# Patient Record
Sex: Female | Born: 1940
Health system: Southern US, Community
[De-identification: ages and names within clinical notes are randomized; demographics above are authoritative.]

## PROBLEM LIST (undated history)

## (undated) DIAGNOSIS — K219 Gastro-esophageal reflux disease without esophagitis: Secondary | ICD-10-CM

## (undated) DIAGNOSIS — J439 Emphysema, unspecified: Secondary | ICD-10-CM

## (undated) DIAGNOSIS — E213 Hyperparathyroidism, unspecified: Secondary | ICD-10-CM

## (undated) DIAGNOSIS — I10 Essential (primary) hypertension: Secondary | ICD-10-CM

## (undated) DIAGNOSIS — M1990 Unspecified osteoarthritis, unspecified site: Secondary | ICD-10-CM

## (undated) DIAGNOSIS — J309 Allergic rhinitis, unspecified: Secondary | ICD-10-CM

## (undated) DIAGNOSIS — J479 Bronchiectasis, uncomplicated: Secondary | ICD-10-CM

## (undated) DIAGNOSIS — B009 Herpesviral infection, unspecified: Secondary | ICD-10-CM

## (undated) DIAGNOSIS — K5790 Diverticulosis of intestine, part unspecified, without perforation or abscess without bleeding: Secondary | ICD-10-CM

## (undated) DIAGNOSIS — K635 Polyp of colon: Secondary | ICD-10-CM

## (undated) HISTORY — DX: Hyperparathyroidism, unspecified: E21.3

## (undated) HISTORY — DX: Emphysema, unspecified: J43.9

## (undated) HISTORY — PX: COLONOSCOPY: SHX174

## (undated) HISTORY — DX: Herpesviral infection, unspecified: B00.9

## (undated) HISTORY — PX: EYE SURGERY: SHX253

## (undated) HISTORY — DX: Polyp of colon: K63.5

## (undated) HISTORY — DX: Allergic rhinitis, unspecified: J30.9

## (undated) HISTORY — DX: Bronchiectasis, uncomplicated: J47.9

## (undated) HISTORY — DX: Gastro-esophageal reflux disease without esophagitis: K21.9

## (undated) HISTORY — DX: Unspecified osteoarthritis, unspecified site: M19.90

## (undated) HISTORY — DX: Diverticulosis of intestine, part unspecified, without perforation or abscess without bleeding: K57.90

## (undated) HISTORY — PX: ABDOMINAL HYSTERECTOMY: SHX81

---

## 1997-10-31 ENCOUNTER — Ambulatory Visit (HOSPITAL_COMMUNITY): Admission: RE | Admit: 1997-10-31 | Discharge: 1997-10-31 | Payer: Self-pay | Admitting: *Deleted

## 1998-05-10 ENCOUNTER — Other Ambulatory Visit: Admission: RE | Admit: 1998-05-10 | Discharge: 1998-05-10 | Payer: Self-pay | Admitting: Obstetrics and Gynecology

## 1998-05-15 ENCOUNTER — Ambulatory Visit (HOSPITAL_COMMUNITY): Admission: RE | Admit: 1998-05-15 | Discharge: 1998-05-15 | Payer: Self-pay | Admitting: *Deleted

## 1999-07-24 ENCOUNTER — Other Ambulatory Visit: Admission: RE | Admit: 1999-07-24 | Discharge: 1999-07-24 | Payer: Self-pay | Admitting: Obstetrics and Gynecology

## 1999-07-24 ENCOUNTER — Ambulatory Visit (HOSPITAL_COMMUNITY): Admission: RE | Admit: 1999-07-24 | Discharge: 1999-07-24 | Payer: Self-pay | Admitting: *Deleted

## 2000-10-09 ENCOUNTER — Other Ambulatory Visit: Admission: RE | Admit: 2000-10-09 | Discharge: 2000-10-09 | Payer: Self-pay | Admitting: *Deleted

## 2000-10-22 ENCOUNTER — Encounter: Payer: Self-pay | Admitting: *Deleted

## 2000-10-22 ENCOUNTER — Ambulatory Visit (HOSPITAL_COMMUNITY): Admission: RE | Admit: 2000-10-22 | Discharge: 2000-10-22 | Payer: Self-pay | Admitting: *Deleted

## 2002-01-22 ENCOUNTER — Encounter: Payer: Self-pay | Admitting: Internal Medicine

## 2002-01-22 ENCOUNTER — Ambulatory Visit (HOSPITAL_COMMUNITY): Admission: RE | Admit: 2002-01-22 | Discharge: 2002-01-22 | Payer: Self-pay | Admitting: Internal Medicine

## 2002-10-26 ENCOUNTER — Encounter: Payer: Self-pay | Admitting: Internal Medicine

## 2002-10-26 ENCOUNTER — Ambulatory Visit (HOSPITAL_COMMUNITY): Admission: RE | Admit: 2002-10-26 | Discharge: 2002-10-26 | Payer: Self-pay | Admitting: Internal Medicine

## 2002-11-26 ENCOUNTER — Encounter: Admission: RE | Admit: 2002-11-26 | Discharge: 2002-11-26 | Payer: Self-pay | Admitting: Internal Medicine

## 2002-11-26 ENCOUNTER — Encounter: Payer: Self-pay | Admitting: Internal Medicine

## 2003-01-31 ENCOUNTER — Ambulatory Visit (HOSPITAL_COMMUNITY): Admission: RE | Admit: 2003-01-31 | Discharge: 2003-01-31 | Payer: Self-pay | Admitting: Internal Medicine

## 2003-01-31 ENCOUNTER — Encounter: Payer: Self-pay | Admitting: Internal Medicine

## 2003-11-17 ENCOUNTER — Other Ambulatory Visit: Admission: RE | Admit: 2003-11-17 | Discharge: 2003-11-17 | Payer: Self-pay | Admitting: Obstetrics and Gynecology

## 2004-01-18 ENCOUNTER — Ambulatory Visit (HOSPITAL_COMMUNITY): Admission: RE | Admit: 2004-01-18 | Discharge: 2004-01-18 | Payer: Self-pay | Admitting: Internal Medicine

## 2005-01-31 ENCOUNTER — Ambulatory Visit (HOSPITAL_COMMUNITY): Admission: RE | Admit: 2005-01-31 | Discharge: 2005-01-31 | Payer: Self-pay | Admitting: Internal Medicine

## 2005-08-06 ENCOUNTER — Ambulatory Visit (HOSPITAL_COMMUNITY): Admission: RE | Admit: 2005-08-06 | Discharge: 2005-08-06 | Payer: Self-pay | Admitting: Internal Medicine

## 2006-02-05 ENCOUNTER — Ambulatory Visit (HOSPITAL_COMMUNITY): Admission: RE | Admit: 2006-02-05 | Discharge: 2006-02-05 | Payer: Self-pay | Admitting: Internal Medicine

## 2006-07-29 ENCOUNTER — Ambulatory Visit (HOSPITAL_COMMUNITY): Admission: RE | Admit: 2006-07-29 | Discharge: 2006-07-29 | Payer: Self-pay | Admitting: Internal Medicine

## 2007-02-19 ENCOUNTER — Ambulatory Visit (HOSPITAL_COMMUNITY): Admission: RE | Admit: 2007-02-19 | Discharge: 2007-02-19 | Payer: Self-pay | Admitting: Internal Medicine

## 2007-11-19 ENCOUNTER — Encounter: Admission: RE | Admit: 2007-11-19 | Discharge: 2007-11-19 | Payer: Self-pay | Admitting: Rehabilitation

## 2008-03-16 ENCOUNTER — Ambulatory Visit (HOSPITAL_COMMUNITY): Admission: RE | Admit: 2008-03-16 | Discharge: 2008-03-16 | Payer: Self-pay | Admitting: Internal Medicine

## 2009-04-10 ENCOUNTER — Ambulatory Visit (HOSPITAL_COMMUNITY): Admission: RE | Admit: 2009-04-10 | Discharge: 2009-04-10 | Payer: Self-pay | Admitting: Internal Medicine

## 2010-04-19 ENCOUNTER — Ambulatory Visit (HOSPITAL_COMMUNITY): Admission: RE | Admit: 2010-04-19 | Discharge: 2010-04-19 | Payer: Self-pay | Admitting: Internal Medicine

## 2010-09-25 ENCOUNTER — Encounter: Payer: Self-pay | Admitting: Gastroenterology

## 2010-09-27 ENCOUNTER — Encounter: Payer: Self-pay | Admitting: Gastroenterology

## 2010-10-09 ENCOUNTER — Encounter (INDEPENDENT_AMBULATORY_CARE_PROVIDER_SITE_OTHER): Payer: Self-pay | Admitting: *Deleted

## 2010-10-12 ENCOUNTER — Encounter: Payer: Self-pay | Admitting: Gastroenterology

## 2010-10-12 ENCOUNTER — Ambulatory Visit
Admission: RE | Admit: 2010-10-12 | Discharge: 2010-10-12 | Payer: Self-pay | Source: Home / Self Care | Attending: Gastroenterology | Admitting: Gastroenterology

## 2010-10-12 ENCOUNTER — Encounter (INDEPENDENT_AMBULATORY_CARE_PROVIDER_SITE_OTHER): Payer: Self-pay | Admitting: *Deleted

## 2010-10-12 ENCOUNTER — Other Ambulatory Visit: Payer: Self-pay | Admitting: Gastroenterology

## 2010-10-12 LAB — FERRITIN: Ferritin: 184.7 ng/mL (ref 10.0–291.0)

## 2010-10-12 LAB — VITAMIN B12: Vitamin B-12: 818 pg/mL (ref 211–911)

## 2010-10-12 LAB — IGA: IgA: 660 mg/dL — ABNORMAL HIGH (ref 68–378)

## 2010-10-12 LAB — CONVERTED CEMR LAB: Tissue Transglutaminase Ab, IgA: 4.2 units (ref <20)

## 2010-10-12 LAB — CALCIUM: Calcium: 10.4 mg/dL (ref 8.4–10.5)

## 2010-10-12 LAB — IBC PANEL
Iron: 73 ug/dL (ref 42–145)
Saturation Ratios: 20.4 % (ref 20.0–50.0)
Transferrin: 255.8 mg/dL (ref 212.0–360.0)

## 2010-10-16 ENCOUNTER — Other Ambulatory Visit: Payer: Self-pay | Admitting: Internal Medicine

## 2010-10-16 DIAGNOSIS — Z803 Family history of malignant neoplasm of breast: Secondary | ICD-10-CM

## 2010-10-16 DIAGNOSIS — N6310 Unspecified lump in the right breast, unspecified quadrant: Secondary | ICD-10-CM

## 2010-10-18 NOTE — Letter (Signed)
Summary: [facility] Instructions  [facility]  [address] [place], [place] [address]   Phone: [phone]  Fax: [phone]       GLORINE HANRATTY    09-Feb-1941    MRN: [id number]        Procedure Day /Date: Monday 2/6/202     Arrival Time: 10:30am     Procedure Time: 11:30am     Location of Procedure:                    X  [facility] (4th Floor)                      PREPARATION FOR COLONOSCOPY WITH MOVIPREP   Starting 5 days prior to your procedure 10/17/2010 do not eat nuts, seeds, popcorn, corn, beans, peas,  salads, or any raw vegetables.  Do not take any fiber supplements (e.g. Metamucil, Citrucel, and Benefiber).  THE DAY BEFORE YOUR PROCEDURE         Sunday 10/21/2010  1.  Drink clear liquids the entire day-NO SOLID FOOD  2.  Do not drink anything colored red or purple.  Avoid juices with pulp.  No orange juice.  3.  Drink at least 64 oz. (8 glasses) of fluid/clear liquids during the day to prevent dehydration and help the prep work efficiently.  CLEAR LIQUIDS INCLUDE: Water Jello Ice Popsicles Tea (sugar ok, no milk/cream) Powdered fruit flavored drinks Coffee (sugar ok, no milk/cream) Gatorade Juice: apple, white grape, white cranberry  Lemonade Clear bullion, consomm, broth Carbonated beverages (any kind) Strained chicken noodle soup Hard Candy                             4.  In the morning, mix first dose of MoviPrep solution:    Empty 1 Pouch A and 1 Pouch B into the disposable container    Add lukewarm drinking water to the top line of the container. Mix to dissolve    Refrigerate (mixed solution should be used within 24 hrs)  5.  Begin drinking the prep at 5:00 p.m. The MoviPrep container is divided by 4 marks.   Every 15 minutes drink the solution down to the next mark (approximately 8 oz) until the full liter is complete.   6.  Follow completed prep with 16 oz of clear liquid of your choice (Nothing red or purple).   Continue to drink clear liquids until bedtime.  7.  Before going to bed, mix second dose of MoviPrep solution:    Empty 1 Pouch A and 1 Pouch B into the disposable container    Add lukewarm drinking water to the top line of the container. Mix to dissolve    Refrigerate  THE DAY OF YOUR PROCEDURE      Monday 10/22/2010  Beginning at 6:30am (5 hours before procedure):         1. Every 15 minutes, drink the solution down to the next mark (approx 8 oz) until the full liter is complete.  2. Follow completed prep with 16 oz. of clear liquid of your choice.    3. You may drink clear liquids until 9:30am (2 HOURS BEFORE PROCEDURE).   MEDICATION INSTRUCTIONS  Unless otherwise instructed, you should take regular prescription medications with a small sip of water   as early as possible the morning of your procedure.  OTHER INSTRUCTIONS  You will need a responsible adult at least 70 years of age to accompany you and drive you home.   This person must remain in the waiting room during your procedure.  Wear loose fitting clothing that is easily removed.  Leave jewelry and other valuables at home.  However, you may wish to bring a book to read or  an iPod/MP3 player to listen to music as you wait for your procedure to start.  Remove all body piercing jewelry and leave at home.  Total time from sign-in until discharge is approximately 2-3 hours.  You should go home directly after your procedure and rest.  You can resume normal activities the  day after your procedure.  The day of your procedure you should not:   Drive   Make legal decisions   Operate machinery   Drink alcohol   Return to work  You will receive specific instructions about eating, activities and medications before you leave.    The above instructions have been reviewed and explained to me by   _______________________    I fully understand and can verbalize these instructions  _____________________________ Date _________

## 2010-10-18 NOTE — Letter (Signed)
Summary: New Patient letter  [facility]  [address] [place], [place] [address]   Phone: [phone]  Fax: [phone]       10/09/2010 MRN: [id number]  Catherine Parks [address] [place], [place]  [address]  Dear [name],  Welcome to the Gastroenterology Division at [facility].    You are scheduled to see Dr.  Sheryn Bison on October 12, 2010 at 8:30am on the 3rd floor at [facility], [address]  We ask that you try to arrive at our office 15 minutes prior to your appointment time to allow for check-in.  We would like you to complete the enclosed self-administered evaluation form prior to your visit and bring it with you on the day of your appointment.  We will review it with you.  Also, please bring a complete list of all your medications or, if you prefer, bring the medication bottles and we will list them.  Please bring your insurance card so that we may make a copy of it.  If your insurance requires a referral to see a specialist, please bring your referral form from your primary care physician.  Co-payments are due at the time of your visit and may be paid by cash, check or credit card.     Your office visit will consist of a consult with your physician (includes a physical exam), any laboratory testing he/she may order, scheduling of any necessary diagnostic testing (e.g. x-ray, ultrasound, CT-scan), and scheduling of a procedure (e.g. Endoscopy, Colonoscopy) if required.  Please allow enough time on your schedule to allow for any/all of these possibilities.    If you cannot keep your appointment, please call [phone] to cancel or reschedule prior to your appointment date.  This allows [place] the opportunity to schedule an appointment for another patient in need of care.  If you do not cancel or reschedule by 5 p.m. the business day prior to your appointment date, you will be charged a $50.00 late cancellation/no-show fee.    Thank  you for choosing [facility] for your medical needs.  We appreciate the opportunity to care for you.  Please visit [place] at our website  to learn more about our practice.                     Sincerely,                                                             The Gastroenterology Division

## 2010-10-18 NOTE — Assessment & Plan Note (Signed)
Summary: abd pain.Marland Kitchenem   History of Present Illness Visit Type: Initial Consult Primary GI MD: Sheryn Bison MD FACP FAGA Primary Provider: Marisue Brooklyn, DO Requesting Provider: Marisue Brooklyn, DO Chief Complaint: Lower abd pain and lower back pain that started back in October. Pt has a BM every day but the stools are hard and small. Pt does strain to use the bathroom.  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 She apparently had a colonoscopy 10 years ago that was unremarkable, but these records are not available for review. She has been on the care of Dr. Marisue Brooklyn and his has had normal labs except for a mildly elevated serum calcium level felt secondary to po calcium supplementation.  Her pain is in the lower quadrants and it seems to be progressive as the day goes on,described as a sharp pain, made worse if she does not eat, is associated mild anorexia, mild weight loss, but does not seem related to bowel movements. She relates hard stools,and has to occasionally self disimpact herself. There is no history of rectal bleeding, melena, upper GI or hepatobiliary complaints. She was using Aleve but has discontinued NSAIDs. Past history is remarkable for previous total abdominal hysterectomy apparently she has atrophic ovaries on exam.  She denies systemic complaints such as fever, chills, skin rashes, joint pains, oral stomatitis, or visual difficulties. Her essential hypertension is well controlled on clonidine, amlodipine, and triamterene-HCTZ. Family history is remarkable for ovarian cancer in her sister but no known colon cancer.The Patient Does Smoke and uses ethanol socially.     GI Review of Systems    Reports abdominal pain and  loss of appetite.     Location of  Abdominal pain: lower abdomen.    Denies acid reflux, belching, bloating, chest pain,  dysphagia with liquids, dysphagia with solids, heartburn, nausea, vomiting, vomiting blood, weight loss, and  weight gain.      Reports constipation.     Denies anal fissure, black tarry stools, change in bowel habit, diarrhea, diverticulosis, fecal incontinence, heme positive stool, hemorrhoids, irritable bowel syndrome, jaundice, light color stool, liver problems, rectal bleeding, and  rectal pain. Preventive Screening-Counseling & Management  Alcohol-Tobacco     Smoking Status: current      Drug Use:  no.      Current Medications (verified): 1)  Clonidine Hcl 0.2 Mg Tabs (Clonidine Hcl) .... One Tablet By Mouth Two Times A Day 2)  Amlodipine Besylate 10 Mg Tabs (Amlodipine Besylate) .... One Tablet By Mouth Once Daily 3)  Triamterene-Hctz 37.5-25 Mg Tabs (Triamterene-Hctz) .... One Tablet By Mouth Once Daily  Allergies (verified): No Known Drug Allergies  Past History:  Past medical, surgical, family and social histories (including risk factors) reviewed for relevance to current acute and chronic problems.  Past Medical History: Arthritis Hyperlipidemia Hypertension  Past Surgical History: Hysterectomy  Family History: Reviewed history and no changes required. Family History of Breast Cancer:Maternal Grandmother, Sister Family History of Ovarian Cancer:Sister Family History of Diabetes: Mother, Sister, Maternal Grandmother Family History of Heart Disease: Father, Brother, Mother Family History of Prostate Cancer:Brother  Social History: Reviewed history and no changes required. 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  Alcohol Use - yes occasionally on weekends Daily Caffeine Use every other day Illicit Drug Use - no Smoking Status:  current Drug Use:  no  Review of Systems       The  patient complains of back pain, itching, skin rash, sleeping problems, and urination - excessive.  The patient denies allergy/sinus, anemia, anxiety-new, arthritis/joint  pain, blood in urine, breast changes/lumps, change in vision, confusion, cough, coughing up blood, depression-new, fainting, fatigue, fever, headaches-new, hearing problems, heart murmur, heart rhythm changes, menstrual pain, muscle pains/cramps, night sweats, nosebleeds, pregnancy symptoms, shortness of breath, sore throat, swelling of feet/legs, swollen lymph glands, thirst - excessive , urination - excessive , urination changes/pain, urine leakage, vision changes, and voice change.    Vital Signs:  Patient profile:   70 year old female Height:      67 inches Weight:      153.13 pounds BMI:     24.07 Pulse rate:   84 / minute Pulse rhythm:   regular BP sitting:   118 / 72  (right arm) Cuff size:   regular  Vitals Entered By: Christie Nottingham CMA Duncan Dull) (October 12, 2010 8:33 AM)  Physical Exam  General:  Well developed, well nourished, no acute distress.healthy appearing.  healthy appearing.   Head:  Normocephalic and atraumatic. Eyes:  PERRLA, no icterus.exam deferred to patient's ophthalmologist.  exam deferred to patient's ophthalmologist.   Neck:  Supple; no masses or thyromegaly. Lungs:  Clear throughout to auscultation. Heart:  Regular rate and rhythm; no murmurs, rubs,  or bruits. Abdomen:  Prominent suprapubic scar noted. There is no hepatosplenomegaly, abdominal masses, but there is mild tenderness of the left lower quadrant without definite mass. Bowel sounds are normal. Rectal:  Normal exam.There is a hard impacted stool in the rectal vault which is guaiac-negative. I cannot appreciate a rectocele, fissure, or fistulae. Msk:  Symmetrical with no gross deformities. Normal posture. Extremities:  No clubbing, cyanosis, edema or deformities noted. Neurologic:  Alert and  oriented x4;  grossly normal neurologically. Cervical Nodes:  No significant cervical adenopathy. Psych:  Alert and cooperative. .depressed affect.     Impression & Recommendations:  Problem # 1:   CONSTIPATION (ICD-564.00) Assessment Deteriorated Probable worsening chronic functional constipation with associated pelvic-colonic adhesions causing her pain. I have placed her on Amitiza 8 micrograms twice a day, Metamucil in the morning, and 8 ounces of MiraLax at bedtime with p.r.n. tramadol 50 mg every 8 hours as needed for pain. Colonoscopy has been scheduled at her convenience. I will repeat her serum calcium level, sedimentation rate, and CRP an anemia profile. Orders: TLB-B12, Serum-Total ONLY (03474-Q59) TLB-Ferritin (82728-FER) TLB-Folic Acid (Folate) (82746-FOL) TLB-IBC Pnl (Iron/FE;Transferrin) (83550-IBC) TLB-Calcium (82310-CA) TLB-CRP-High Sensitivity (C-Reactive Protein) (86140-FCRP) TLB-Sedimentation Rate (ESR) (85652-ESR) TLB-IgA (Immunoglobulin A) (82784-IGA) T-Sprue Panel (Celiac Disease Aby Eval) (83516x3/[address])  Problem # 2:  HYPERTENSION (ICD-401.9) Assessment: Improved blood pressure today 118/72, and she is been asked to continue all her other medications per Dr. Carmela Hurt.  Other Orders: Colonoscopy (Colon)  Patient Instructions: 1)  Copy sent to : Marisue Brooklyn, DO 2)  Please go to the basement today for your labs.  3)  Your prescription(s) have been sent to you pharmacy.  4)  Your procedure has been scheduled for 10/22/2010, please follow the seperate instructions.  5)  [facility] Patient Information Guide given to patient.  6)  Upper Endoscopy brochure given.  7)  The medication list was reviewed and reconciled.  All changed / newly prescribed medications were explained.  A complete medication list was provided to the patient / caregiver. Prescriptions: MOVIPREP 100 GM  SOLR (PEG-KCL-NACL-NASULF-NA ASC-C) As per prep instructions.  #1 x 0   Entered by:   Harlow Mares  CMA (AAMA)   Authorized by:   Mardella Layman MD [facility]   Signed by:   Mardella Layman MD [facility] on 10/12/2010   Method used:   Electronically to        [facility]  [address] [phone]* (retail)       [address]       [place], [place]  [id number]       Ph: [phone] or [phone]       Fax: [phone]   RxID:   [id number] AMITIZA 8 MCG CAPS (LUBIPROSTONE) take one by mouth two times a day with food  #60 x 3   Entered by:   Harlow Mares CMA (AAMA)   Authorized by:   Mardella Layman MD [facility]   Signed by:   Mardella Layman MD [facility] on 10/12/2010   Method used:   [facility]        [facility]  [address] [phone]* (retail)       [address]       [place], [place]  [id number]       Ph: [phone] or [phone]       Fax: [phone]   RxID:   [id number] TRAMADOL HCL 50 MG TABS (TRAMADOL HCL) take one by mouth two times a day  #60 x 1   Entered by:   Harlow Mares CMA (AAMA)   Authorized by:   Mardella Layman MD [facility]   Signed by:   Mardella Layman MD [facility] on 10/12/2010   Method used:   [facility]  [address] [phone]* (retail)       [address]       [place], [place]  [id number]       Ph: [phone] or [phone]       Fax: [phone]   RxID:   [id number]

## 2010-10-22 ENCOUNTER — Other Ambulatory Visit (AMBULATORY_SURGERY_CENTER): Payer: Medicare Other | Admitting: Gastroenterology

## 2010-10-22 ENCOUNTER — Encounter: Payer: Self-pay | Admitting: Gastroenterology

## 2010-10-22 DIAGNOSIS — K5730 Diverticulosis of large intestine without perforation or abscess without bleeding: Secondary | ICD-10-CM

## 2010-10-22 DIAGNOSIS — R109 Unspecified abdominal pain: Secondary | ICD-10-CM

## 2010-10-24 NOTE — Letter (Signed)
Summary: [facility]  [facility]   Imported By: Sherian Rein 10/19/2010 15:04:07  _____________________________________________________________________  External Attachment:    Type:   Image     Comment:   External Document

## 2010-10-29 ENCOUNTER — Other Ambulatory Visit: Payer: Self-pay | Admitting: Internal Medicine

## 2010-10-29 DIAGNOSIS — R630 Anorexia: Secondary | ICD-10-CM

## 2010-10-29 DIAGNOSIS — R634 Abnormal weight loss: Secondary | ICD-10-CM

## 2010-10-31 ENCOUNTER — Other Ambulatory Visit: Payer: Self-pay | Admitting: Internal Medicine

## 2010-11-01 NOTE — Procedures (Signed)
Summary: Colonoscopy   Colonoscopy  Procedure date:  10/22/2010  Findings:      Location:  [facility].    Procedures Next Due Date:    Colonoscopy: 10/2020 COLONOSCOPY PROCEDURE REPORT  PATIENT:  Catherine Parks, Catherine Parks  MR#:  [id number] BIRTHDATE:   Feb 03, 1941, 69 yrs. old   GENDER:   female ENDOSCOPIST:   Vania Rea. Jarold Motto, MD, [facility] REF. BY: Marisue Brooklyn, D.O. PROCEDURE DATE:  10/22/2010 PROCEDURE:  Average-risk screening colonoscopy G0121 ASA CLASS:   Class II INDICATIONS: Abdominal pain  MEDICATIONS:    Fentanyl 75 mcg IV, Versed 8 mg IV  DESCRIPTION OF PROCEDURE:   After the risks benefits and alternatives of the procedure were thoroughly explained, informed consent was obtained.  Digital rectal exam was performed and revealed no abnormalities.   The [id number] endoscope was introduced through the anus and advanced to the cecum, which was identified by both the appendix and ileocecal valve, limited by a redundant colon, stenosis, extreme patient discomfort.    The quality of the prep was excellent, using MoviPrep.  The instrument was then slowly withdrawn as the colon was fully examined. <<PROCEDUREIMAGES>>      <<OLD IMAGES>>  FINDINGS:  Severe diverticulosis was found in the sigmoid to descending colon segments. "TIGHT " SIGMOID AREA.LAGE AND NUMEROUS TICS NOTED.SOME STENOSIS.  No polyps or cancers were seen.  This was otherwise a normal examination of the colon.   Retroflexed views in the rectum revealed no abnormalities.    The scope was then withdrawn from the patient and the procedure completed.  COMPLICATIONS:   None ENDOSCOPIC IMPRESSION:  1) Severe diverticulosis in the sigmoid to descending colon segments  2) No polyps or cancers  3) Otherwise normal examination RECOMMENDATIONS:  1) high fiber diet  2) metamucil or benefiber  3) Repeat Colonscopy in 10 years. REPEAT EXAM:   No   _______________________________ Vania Rea. Jarold Motto, MD,  [facility]  CC:

## 2010-11-02 ENCOUNTER — Ambulatory Visit
Admission: RE | Admit: 2010-11-02 | Discharge: 2010-11-02 | Disposition: A | Discharge status: Home / Self Care | Payer: Medicare Other | Source: Ambulatory Visit | Attending: Internal Medicine | Admitting: Internal Medicine

## 2010-11-02 ENCOUNTER — Other Ambulatory Visit: Payer: Medicare Other

## 2010-11-07 ENCOUNTER — Other Ambulatory Visit: Payer: Medicare Other

## 2010-11-13 ENCOUNTER — Ambulatory Visit: Payer: Medicare Other | Admitting: Hematology & Oncology

## 2010-12-03 ENCOUNTER — Other Ambulatory Visit: Payer: Self-pay | Admitting: Hematology & Oncology

## 2010-12-03 ENCOUNTER — Ambulatory Visit (HOSPITAL_BASED_OUTPATIENT_CLINIC_OR_DEPARTMENT_OTHER): Payer: Medicare Other | Admitting: Hematology & Oncology

## 2010-12-03 DIAGNOSIS — F17200 Nicotine dependence, unspecified, uncomplicated: Secondary | ICD-10-CM

## 2010-12-03 DIAGNOSIS — Z803 Family history of malignant neoplasm of breast: Secondary | ICD-10-CM

## 2010-12-03 DIAGNOSIS — I10 Essential (primary) hypertension: Secondary | ICD-10-CM

## 2010-12-03 LAB — CMP (CANCER CENTER ONLY)
ALT(SGPT): 15 U/L (ref 10–47)
AST: 24 U/L (ref 11–38)
Albumin: 3.2 g/dL — ABNORMAL LOW (ref 3.3–5.5)
CO2: 27 mEq/L (ref 18–33)
Calcium: 10 mg/dL (ref 8.0–10.3)
Chloride: 102 mEq/L (ref 98–108)
Potassium: 3.5 mEq/L (ref 3.3–4.7)

## 2010-12-03 LAB — CBC WITH DIFFERENTIAL (CANCER CENTER ONLY)
BASO#: 0 10*3/uL (ref 0.0–0.2)
BASO%: 0.3 % (ref 0.0–2.0)
HCT: 34.7 % — ABNORMAL LOW (ref 34.8–46.6)
LYMPH%: 28.6 % (ref 14.0–48.0)
MCHC: 34.6 g/dL (ref 32.0–36.0)
MCV: 86 fL (ref 81–101)
MONO#: 0.8 10*3/uL (ref 0.1–0.9)
NEUT%: 56.3 % (ref 39.6–80.0)
RDW: 15.8 % — ABNORMAL HIGH (ref 11.1–15.7)

## 2010-12-05 LAB — PROTEIN ELECTROPHORESIS, SERUM: Gamma Globulin: 14.4 % (ref 11.1–18.8)

## 2010-12-05 LAB — ANGIOTENSIN CONVERTING ENZYME: Angiotensin 1 CE: 60 U/L — ABNORMAL HIGH (ref 8–52)

## 2010-12-05 LAB — LACTATE DEHYDROGENASE: LDH: 146 U/L (ref 94–250)

## 2011-05-03 ENCOUNTER — Other Ambulatory Visit: Payer: Self-pay | Admitting: Hematology & Oncology

## 2011-05-03 DIAGNOSIS — R05 Cough: Secondary | ICD-10-CM

## 2011-05-08 ENCOUNTER — Other Ambulatory Visit: Payer: Self-pay | Admitting: Hematology & Oncology

## 2011-05-08 ENCOUNTER — Ambulatory Visit (HOSPITAL_BASED_OUTPATIENT_CLINIC_OR_DEPARTMENT_OTHER)
Admission: RE | Admit: 2011-05-08 | Discharge: 2011-05-08 | Disposition: A | Discharge status: Home / Self Care | Payer: Medicare Other | Source: Ambulatory Visit | Attending: Hematology & Oncology | Admitting: Hematology & Oncology

## 2011-05-08 ENCOUNTER — Encounter (HOSPITAL_BASED_OUTPATIENT_CLINIC_OR_DEPARTMENT_OTHER): Payer: Medicare Other | Admitting: Hematology & Oncology

## 2011-05-08 DIAGNOSIS — I10 Essential (primary) hypertension: Secondary | ICD-10-CM

## 2011-05-08 DIAGNOSIS — R059 Cough, unspecified: Secondary | ICD-10-CM | POA: Insufficient documentation

## 2011-05-08 DIAGNOSIS — Z0000 Encounter for general adult medical examination without abnormal findings: Secondary | ICD-10-CM

## 2011-05-08 DIAGNOSIS — F17200 Nicotine dependence, unspecified, uncomplicated: Secondary | ICD-10-CM | POA: Insufficient documentation

## 2011-05-08 DIAGNOSIS — Z803 Family history of malignant neoplasm of breast: Secondary | ICD-10-CM

## 2011-05-08 DIAGNOSIS — R05 Cough: Secondary | ICD-10-CM | POA: Insufficient documentation

## 2011-05-08 LAB — CMP (CANCER CENTER ONLY)
ALT(SGPT): 12 U/L (ref 10–47)
AST: 19 U/L (ref 11–38)
Albumin: 3.2 g/dL — ABNORMAL LOW (ref 3.3–5.5)
Alkaline Phosphatase: 61 U/L (ref 26–84)
BUN, Bld: 11 mg/dL (ref 7–22)
Calcium: 10.2 mg/dL (ref 8.0–10.3)
Chloride: 97 mEq/L — ABNORMAL LOW (ref 98–108)
Potassium: 3.5 mEq/L (ref 3.3–4.7)
Sodium: 134 mEq/L (ref 128–145)

## 2011-05-08 LAB — CBC WITH DIFFERENTIAL (CANCER CENTER ONLY)
BASO%: 0.3 % (ref 0.0–2.0)
EOS%: 3.7 % (ref 0.0–7.0)
HCT: 35.6 % (ref 34.8–46.6)
LYMPH#: 2 10*3/uL (ref 0.9–3.3)
LYMPH%: 32.9 % (ref 14.0–48.0)
MCH: 31.3 pg (ref 26.0–34.0)
MCHC: 35.7 g/dL (ref 32.0–36.0)
MONO%: 13.5 % — ABNORMAL HIGH (ref 0.0–13.0)
NEUT%: 49.6 % (ref 39.6–80.0)
RDW: 15.6 % (ref 11.1–15.7)

## 2011-05-09 LAB — LACTATE DEHYDROGENASE: LDH: 127 U/L (ref 94–250)

## 2011-07-30 ENCOUNTER — Encounter (HOSPITAL_COMMUNITY): Payer: Self-pay | Admitting: *Deleted

## 2011-07-30 ENCOUNTER — Emergency Department (HOSPITAL_COMMUNITY)
Admission: EM | Admit: 2011-07-30 | Discharge: 2011-07-30 | Disposition: A | Discharge status: Home / Self Care | Payer: Medicare Other | Attending: Emergency Medicine | Admitting: Emergency Medicine

## 2011-07-30 DIAGNOSIS — S0181XA Laceration without foreign body of other part of head, initial encounter: Secondary | ICD-10-CM

## 2011-07-30 DIAGNOSIS — I10 Essential (primary) hypertension: Secondary | ICD-10-CM | POA: Insufficient documentation

## 2011-07-30 DIAGNOSIS — W010XXA Fall on same level from slipping, tripping and stumbling without subsequent striking against object, initial encounter: Secondary | ICD-10-CM | POA: Insufficient documentation

## 2011-07-30 DIAGNOSIS — S0180XA Unspecified open wound of other part of head, initial encounter: Secondary | ICD-10-CM | POA: Insufficient documentation

## 2011-07-30 HISTORY — DX: Essential (primary) hypertension: I10

## 2011-07-30 NOTE — ED Notes (Signed)
Denies loc from fall. Denies pain. States tender over area.

## 2011-07-30 NOTE — ED Provider Notes (Signed)
History     CSN: [id number] Arrival date & time: 07/30/2011 10:02 AM   First MD Initiated Contact with Patient 07/30/11 1126      Chief Complaint  Patient presents with  . Head Laceration    (Consider location/radiation/quality/duration/timing/severity/associated sxs/prior treatment) Patient is a 70 y.o. female presenting with scalp laceration. The history is provided by the patient.  Head Laceration This is a new problem. The current episode started 1 to 2 hours ago. The problem has not changed since onset.Pertinent negatives include no headaches.   patient tripped and hit her left face and door frame. No loss of consciousness. She doesn't laceration. Bleeding is now controlled. She's on no blood thinners. She's not had a headache, which is a little tender at the site. No other numbness or weakness. No injury besides her head.  Past Medical History  Diagnosis Date  . Hypertension     Past Surgical History  Procedure Date  . Abdominal hysterectomy     No family history on file.  History  Substance Use Topics  . Smoking status: Not on file  . Smokeless tobacco: Not on file  . Alcohol Use:     OB History    Grav Para Term Preterm Abortions TAB SAB Ect Mult Living                  Review of Systems  Constitutional: Negative for fever.  HENT: Negative for neck pain and neck stiffness.   Respiratory: Negative for chest tightness.   Musculoskeletal: Negative for back pain.       No neck pain.  Neurological: Negative for tremors, syncope, numbness and headaches.    Allergies  Review of patient's allergies indicates no known allergies.  Home Medications   Current Outpatient Rx  Name Route Sig Dispense Refill  . AMLODIPINE BESYLATE 10 MG PO TABS Oral Take 10 mg by mouth daily.      . ASPIRIN EC 81 MG PO TBEC Oral Take 81 mg by mouth 2 (two) times a week.      Marland Kitchen CLONIDINE HCL 0.2 MG PO TABS Oral Take 0.2 mg by mouth 2 (two) times daily.      . TRIAMTERENE-HCTZ  37.5-25 MG PO TABS Oral Take 1 tablet by mouth daily.        BP 107/73  Pulse 92  Temp(Src) 98.2 F (36.8 C) (Oral)  Resp 12  SpO2 100%  Physical Exam  Constitutional: She is oriented to person, place, and time. She appears well-developed and well-nourished.  HENT:  Head: Normocephalic.       2 cm vertical laceration through medial left eyebrow. Bleeding controlled. Extraocular movements intact. No visual changes. No step-off or deformity of the orbital ridge  Eyes: EOM are normal. Pupils are equal, round, and reactive to light.  Neck: Normal range of motion. Neck supple.  Cardiovascular: Normal rate.   Neurological: She is alert and oriented to person, place, and time. No cranial nerve deficit.  Skin: Skin is warm and dry.    ED Course  LACERATION REPAIR Date/Time: 07/30/2011 12:27 PM Performed by: Benjiman Core R. Authorized by: Billee Cashing Consent: Verbal consent obtained. Written consent not obtained. Risks and benefits: risks, benefits and alternatives were discussed Consent given by: patient Patient understanding: patient states understanding of the procedure being performed Patient consent: the patient's understanding of the procedure matches consent given Procedure consent: procedure consent matches procedure scheduled Relevant documents: relevant documents present and verified Test results: test results not  available Site marked: the operative site was marked Imaging studies: imaging studies not available Required items: required blood products, implants, devices, and special equipment available Patient identity confirmed: verbally with patient and arm band Time out: Immediately prior to procedure a "time out" was called to verify the correct patient, procedure, equipment, support staff and site/side marked as required. Body area: head/neck Location details: forehead Laceration length: 2 cm Tendon involvement: none Nerve involvement: none Vascular  damage: no Anesthesia: local infiltration Local anesthetic: lidocaine 2% without epinephrine Anesthetic total: 2 ml Patient sedated: no Preparation: Patient was prepped and draped in the usual sterile fashion. Irrigation solution: saline Amount of cleaning: standard Debridement: none Degree of undermining: none Skin closure: 5-0 Prolene Number of sutures: 5 Approximation: close Approximation difficulty: simple Dressing: antibiotic ointment and 4x4 sterile gauze Patient tolerance: Patient tolerated the procedure well with no immediate complications.   (including critical care time)  Labs Reviewed - No data to display No results found.   1. Forehead laceration       MDM  Facial laceration after fall. Doubt intracranial injury. Wound was repaired in ER. She'll follow up in the urgent care to get sutures removed in 3-5 days. She was discharged home.        Juliet Rude. Rubin Payor, MD 07/30/11 1231

## 2011-07-30 NOTE — ED Notes (Signed)
Pt was walking...tripped and fell hitting head on the door frame. No loc. Bleeding controlled.

## 2011-07-30 NOTE — ED Notes (Signed)
Presents with 3cm laceration over left eyebrow.

## 2011-08-03 ENCOUNTER — Encounter (HOSPITAL_COMMUNITY): Payer: Self-pay

## 2011-08-03 ENCOUNTER — Emergency Department (INDEPENDENT_AMBULATORY_CARE_PROVIDER_SITE_OTHER)
Admission: EM | Admit: 2011-08-03 | Discharge: 2011-08-03 | Disposition: A | Discharge status: Home / Self Care | Payer: Medicare Other | Source: Home / Self Care | Attending: Emergency Medicine | Admitting: Emergency Medicine

## 2011-08-03 DIAGNOSIS — IMO0002 Reserved for concepts with insufficient information to code with codable children: Secondary | ICD-10-CM

## 2011-08-03 DIAGNOSIS — T148XXA Other injury of unspecified body region, initial encounter: Secondary | ICD-10-CM

## 2011-08-03 DIAGNOSIS — X58XXXA Exposure to other specified factors, initial encounter: Secondary | ICD-10-CM

## 2011-08-03 NOTE — ED Provider Notes (Signed)
History     CSN: [id number] Arrival date & time: 08/03/2011  9:30 AM   First MD Initiated Contact with Patient 08/03/11 [phone]      Chief Complaint  Patient presents with  . Suture / Staple Removal    Pt needs sutures removed from head, put in at ED on Tuesday    (Consider location/radiation/quality/duration/timing/severity/associated sxs/prior treatment) HPI Comments: Catherine Parks returns today for suture removal. She lacerated her left eyebrow 5 days ago and this was sutured up in the emergency room. Her laceration is healing up well. She's been putting antibiotic ointment on it. There's been no evidence of infection. No drainage. No eye or neurological complaints. She denies any headache.  Patient is a 70 y.o. female presenting with suture removal.  Suture / Staple Removal     Past Medical History  Diagnosis Date  . Hypertension     Past Surgical History  Procedure Date  . Abdominal hysterectomy     History reviewed. No pertinent family history.  History  Substance Use Topics  . Smoking status: Never Smoker   . Smokeless tobacco: Not on file  . Alcohol Use: No    OB History    Grav Para Term Preterm Abortions TAB SAB Ect Mult Living                  Review of Systems  Constitutional: Negative for fever and chills.  Skin: Positive for wound. Negative for color change, pallor and rash.    Allergies  Review of patient's allergies indicates no known allergies.  Home Medications   Current Outpatient Rx  Name Route Sig Dispense Refill  . AMLODIPINE BESYLATE 10 MG PO TABS Oral Take 10 mg by mouth daily.      . ASPIRIN EC 81 MG PO TBEC Oral Take 81 mg by mouth 2 (two) times a week.      Marland Kitchen CLONIDINE HCL 0.2 MG PO TABS Oral Take 0.2 mg by mouth 2 (two) times daily.      . TRIAMTERENE-HCTZ 37.5-25 MG PO TABS Oral Take 1 tablet by mouth daily.        BP 159/95  Pulse 70  Temp(Src) 98.8 F (37.1 C) (Oral)  Resp 18  SpO2 100%  Physical Exam  Nursing note and  vitals reviewed. Constitutional: She appears well-developed and well-nourished. No distress.  HENT:  Head: Normocephalic.  Right Ear: External ear normal.  Left Ear: External ear normal.  Nose: Nose normal.  Mouth/Throat: Oropharynx is clear and moist.       She has a laceration across her left eyebrow measuring 2 cm. This is healing well with no evidence of infection.  Skin: Skin is warm and dry. No abrasion, no bruising, no ecchymosis, no lesion and no rash noted. She is not diaphoretic. No erythema. No pallor.    ED Course  Procedures (including critical care time)  Her sutures are removed without any difficulty and she was instructed in wound care.  Labs Reviewed - No data to display No results found.   1. Laceration       MDM  Her laceration is healing up well and she will return if there is any more problems.        Roque Lias, MD 08/03/11 [phone]

## 2011-09-17 LAB — HM PAP SMEAR: HM Pap smear: NORMAL

## 2012-05-12 ENCOUNTER — Ambulatory Visit (HOSPITAL_COMMUNITY)
Admission: RE | Admit: 2012-05-12 | Discharge: 2012-05-12 | Disposition: A | Discharge status: Home / Self Care | Payer: Medicare Other | Source: Ambulatory Visit | Attending: Internal Medicine | Admitting: Internal Medicine

## 2012-05-12 ENCOUNTER — Other Ambulatory Visit (HOSPITAL_COMMUNITY): Payer: Self-pay | Admitting: Internal Medicine

## 2012-05-12 DIAGNOSIS — R059 Cough, unspecified: Secondary | ICD-10-CM | POA: Insufficient documentation

## 2012-05-12 DIAGNOSIS — J984 Other disorders of lung: Secondary | ICD-10-CM | POA: Insufficient documentation

## 2012-05-12 DIAGNOSIS — R05 Cough: Secondary | ICD-10-CM

## 2013-05-14 ENCOUNTER — Other Ambulatory Visit (HOSPITAL_COMMUNITY): Payer: Self-pay | Admitting: Internal Medicine

## 2013-05-14 ENCOUNTER — Ambulatory Visit (HOSPITAL_COMMUNITY)
Admission: RE | Admit: 2013-05-14 | Discharge: 2013-05-14 | Disposition: A | Discharge status: Home / Self Care | Payer: Medicare Other | Source: Ambulatory Visit | Attending: Internal Medicine | Admitting: Internal Medicine

## 2013-05-14 DIAGNOSIS — R109 Unspecified abdominal pain: Secondary | ICD-10-CM

## 2013-05-14 DIAGNOSIS — J9819 Other pulmonary collapse: Secondary | ICD-10-CM | POA: Insufficient documentation

## 2013-05-14 DIAGNOSIS — K5900 Constipation, unspecified: Secondary | ICD-10-CM | POA: Insufficient documentation

## 2013-05-14 DIAGNOSIS — Q7649 Other congenital malformations of spine, not associated with scoliosis: Secondary | ICD-10-CM | POA: Insufficient documentation

## 2013-05-14 DIAGNOSIS — N949 Unspecified condition associated with female genital organs and menstrual cycle: Secondary | ICD-10-CM | POA: Insufficient documentation

## 2013-08-18 ENCOUNTER — Encounter: Payer: Self-pay | Admitting: Internal Medicine

## 2013-08-18 DIAGNOSIS — B009 Herpesviral infection, unspecified: Secondary | ICD-10-CM | POA: Insufficient documentation

## 2013-08-18 DIAGNOSIS — I10 Essential (primary) hypertension: Secondary | ICD-10-CM | POA: Insufficient documentation

## 2013-08-18 DIAGNOSIS — Z86010 Personal history of colonic polyps: Secondary | ICD-10-CM | POA: Insufficient documentation

## 2013-08-18 DIAGNOSIS — K5790 Diverticulosis of intestine, part unspecified, without perforation or abscess without bleeding: Secondary | ICD-10-CM | POA: Insufficient documentation

## 2013-08-18 DIAGNOSIS — K635 Polyp of colon: Secondary | ICD-10-CM | POA: Insufficient documentation

## 2013-08-19 DIAGNOSIS — R7303 Prediabetes: Secondary | ICD-10-CM | POA: Insufficient documentation

## 2013-08-19 DIAGNOSIS — E559 Vitamin D deficiency, unspecified: Secondary | ICD-10-CM | POA: Insufficient documentation

## 2013-08-19 NOTE — Patient Instructions (Signed)

## 2013-08-19 NOTE — Progress Notes (Signed)
Patient ID: Catherine Parks, female   DOB: 09/10/1941, 72 y.o.   MRN: [id number]   This very nice 72 yo WBF who presents for 3 month follow up with Hypertension, Hyperlipidemia, Pre-Diabetes and Vitamin D Deficiency.    BP has been controlled at home. Today's BP is 138/74. Patient denies any cardiac type chest pain, palpitations, dyspnea/orthopnea/PND, dizziness, claudication, or dependent edema. Unfortunately, patient is still smoking.   Hyperlipidemia is controlled with diet & supplements - patient prefers to try diet over medications if possible. Last Cholesterol was 210, Triglycerides were 182, HDL 66, and LDL 108 - near goal. Patient denies myalgias or other med SE's.    Also, the patient has history of PreDiabetes with last A1c of 6.0% in August. Patient denies any symptoms of reactive hypoglycemia, diabetic polys, paresthesias or visual blurring.   Further, Patient has history of Vitamin D Deficiency with last vitamin D of 63 in August (was 18 in 2008). Patient supplements vitamin D without any suspected side-effects.  Current Outpatient Prescriptions on File Prior to Visit  Medication Sig Dispense Refill  . amLODipine (NORVASC) 10 MG tablet Take 10 mg by mouth daily.        Marland Kitchen aspirin EC 81 MG tablet Take 81 mg by mouth 2 (two) times a week.        . Cholecalciferol (VITAMIN D) 2000 UNITS tablet Take 2,000 Units by mouth 3 (three) times daily.      . cloNIDine (CATAPRES) 0.2 MG tablet Take 0.2 mg by mouth 2 (two) times daily.        . Flaxseed, Linseed, (FLAX SEED OIL) 1000 MG CAPS Take by mouth daily.      . Magnesium 250 MG TABS Take by mouth daily.      Marland Kitchen triamterene-hydrochlorothiazide (MAXZIDE-25) 37.5-25 MG per tablet Take 1 tablet by mouth daily.           Allergies  Allergen Reactions  . Shellfish Allergy     PMHx:   Past Medical History  Diagnosis Date  . Hypertension   . HSV-1 (herpes simplex virus 1) infection   . Diverticulosis   . Colon polyps     FHx:     Reviewed / unchanged  SHx:    Reviewed / unchanged  Systems Review: Constitutional: Denies fever, chills, wt changes, headaches, insomnia, fatigue, night sweats, change in appetite. Eyes: Denies redness, blurred vision, diplopia, discharge, itchy, watery eyes.  ENT: Denies discharge, congestion, post nasal drip, epistaxis, sore throat, earache, hearing loss, dental pain, tinnitus, vertigo, sinus pain, snoring.  CV: Denies chest pain, palpitations, irregular heartbeat, syncope, dyspnea, diaphoresis, orthopnea, PND, claudication, edema. Respiratory: denies cough, dyspnea, DOE, pleurisy, hoarseness, laryngitis, wheezing.  Gastrointestinal: Denies dysphagia, odynophagia, heartburn, reflux, water brash, abdominal pain or cramps, nausea, vomiting, bloating, diarrhea, constipation, hematemesis, melena, hematochezia,  or hemorrhoids. Genitourinary: Denies dysuria, frequency, urgency, nocturia, hesitancy, discharge, hematuria, flank pain. Musculoskeletal: Denies arthralgias, myalgias, stiffness, jt. swelling, pain, limp, strain/sprain.  Skin: Denies pruritus, rash, hives, warts, acne, eczema, change in skin lesion(s). Neuro: No weakness, tremor, incoordination, spasms, paresthesia, or pain. Psychiatric: Denies confusion, memory loss, or sensory loss. Endo: Denies change in weight, skin, hair change.  Heme/Lymph: No excessive bleeding, bruising, orenlarged lymph nodes.  Filed Vitals:   08/20/13 0948  BP: 138/74  Pulse: 72  Temp: 96.8 F (36 C)  Resp: 18    Estimated body mass index is 25.06 kg/(m^2) as calculated from the following:   Height as of this encounter: 5\' 6"  (1.676  m).   Weight as of this encounter: 155 lb 3.2 oz (70.398 kg).  On Exam: Appears well nourished - in no distress. Eyes: PERRLA, EOMs, conjunctiva no swelling or erythema. Sinuses: No frontal/maxillary tenderness ENT/Mouth: EAC's clear, TM's nl w/o erythema, bulging. Nares clear w/o erythema, swelling, exudates.  Oropharynx clear without erythema or exudates. Oral hygiene is good. Tongue normal, non obstructing. Hearing intact.  Neck: Supple. Thyroid nl. Car 2+/2+ without bruits, nodes or JVD. Chest: Respirations nl with BS clear & equal w/o rales, rhonchi, wheezing or stridor.  Cor: Heart sounds normal w/ regular rate and rhythm without sig. murmurs, gallops, clicks, or rubs. Peripheral pulses normal and equal  without edema.  Abdomen: Soft & bowel sounds normal. Non-tender w/o guarding, rebound, hernias, masses, or organomegaly.  Lymphatics: Unremarkable.  Musculoskeletal: Full ROM all peripheral extremities, joint stability, 5/5 strength, and normal gait.  Skin: Warm, dry without exposed rashes, lesions, ecchymosis apparent.  Neuro: Cranial nerves intact, reflexes equal bilaterally. Sensory-motor testing grossly intact. Tendon reflexes grossly intact.  Pysch: Alert & oriented x 3. Insight and judgement nl & appropriate. No ideations.  Assessment and Plan:  1. Hypertension - Continue monitor blood pressure at home. Continue diet/meds same.  2. Hyperlipidemia - Continue diet/meds, exercise,& lifestyle modifications. Continue monitor periodic cholesterol/liver & renal functions   3. Pre-diabetes/Insulin Resistance - Continue diet, exercise, lifestyle modifications. Monitor appropriate labs.  4. Vitamin D Deficiency - Continue supplementation.  Recommended regular exercise, BP monitoring, weight control, and discussed med and SE's. Recommended labs to assess and monitor clinical status. Further disposition pending results of labs. Patient was counseled on smoking cessation.

## 2013-08-20 ENCOUNTER — Ambulatory Visit (INDEPENDENT_AMBULATORY_CARE_PROVIDER_SITE_OTHER): Payer: Medicare Other | Admitting: Internal Medicine

## 2013-08-20 ENCOUNTER — Encounter: Payer: Self-pay | Admitting: Internal Medicine

## 2013-08-20 VITALS — BP 138/74 | HR 72 | Temp 96.8°F | Resp 18 | Ht 66.0 in | Wt 155.2 lb

## 2013-08-20 DIAGNOSIS — E782 Mixed hyperlipidemia: Secondary | ICD-10-CM

## 2013-08-20 DIAGNOSIS — E559 Vitamin D deficiency, unspecified: Secondary | ICD-10-CM

## 2013-08-20 DIAGNOSIS — Z79899 Other long term (current) drug therapy: Secondary | ICD-10-CM

## 2013-08-20 DIAGNOSIS — I10 Essential (primary) hypertension: Secondary | ICD-10-CM

## 2013-08-20 DIAGNOSIS — R7309 Other abnormal glucose: Secondary | ICD-10-CM

## 2013-08-20 LAB — BASIC METABOLIC PANEL WITH GFR
BUN: 12 mg/dL (ref 6–23)
CO2: 30 mEq/L (ref 19–32)
Chloride: 103 mEq/L (ref 96–112)
Creat: 0.85 mg/dL (ref 0.50–1.10)
GFR, Est Non African American: 69 mL/min
Glucose, Bld: 87 mg/dL (ref 70–99)
Potassium: 3.8 mEq/L (ref 3.5–5.3)

## 2013-08-20 LAB — LIPID PANEL
Cholesterol: 194 mg/dL (ref 0–200)
Total CHOL/HDL Ratio: 2.6 Ratio
VLDL: 26 mg/dL (ref 0–40)

## 2013-08-20 LAB — CBC WITH DIFFERENTIAL/PLATELET
Basophils Relative: 1 % (ref 0–1)
Eosinophils Absolute: 0.3 10*3/uL (ref 0.0–0.7)
Eosinophils Relative: 5 % (ref 0–5)
HCT: 35.9 % — ABNORMAL LOW (ref 36.0–46.0)
Hemoglobin: 12.4 g/dL (ref 12.0–15.0)
Lymphs Abs: 1.7 10*3/uL (ref 0.7–4.0)
MCH: 29.5 pg (ref 26.0–34.0)
MCHC: 34.5 g/dL (ref 30.0–36.0)
MCV: 85.3 fL (ref 78.0–100.0)
Monocytes Absolute: 0.5 10*3/uL (ref 0.1–1.0)
Monocytes Relative: 9 % (ref 3–12)
Neutrophils Relative %: 51 % (ref 43–77)
RBC: 4.21 MIL/uL (ref 3.87–5.11)

## 2013-08-20 LAB — HEPATIC FUNCTION PANEL
ALT: 13 U/L (ref 0–35)
AST: 15 U/L (ref 0–37)
Albumin: 3.8 g/dL (ref 3.5–5.2)
Alkaline Phosphatase: 66 U/L (ref 39–117)
Total Bilirubin: 0.3 mg/dL (ref 0.3–1.2)

## 2013-08-20 LAB — HEMOGLOBIN A1C
Hgb A1c MFr Bld: 5.9 % — ABNORMAL HIGH (ref <5.7)
Mean Plasma Glucose: 123 mg/dL — ABNORMAL HIGH (ref <117)

## 2013-08-20 LAB — TSH: TSH: 0.621 u[IU]/mL (ref 0.350–4.500)

## 2013-08-21 LAB — VITAMIN D 25 HYDROXY (VIT D DEFICIENCY, FRACTURES): Vit D, 25-Hydroxy: 48 ng/mL (ref 30–89)

## 2013-11-08 ENCOUNTER — Other Ambulatory Visit: Payer: Self-pay | Admitting: *Deleted

## 2013-11-08 MED ORDER — TRIAMTERENE-HCTZ 37.5-25 MG PO TABS: 1.0000 | ORAL_TABLET | Freq: Every day | ORAL | Status: DC

## 2013-11-08 MED ORDER — CLONIDINE HCL 0.2 MG PO TABS: 0.2000 mg | ORAL_TABLET | Freq: Two times a day (BID) | ORAL | Status: DC

## 2013-11-19 ENCOUNTER — Encounter: Payer: Self-pay | Admitting: Internal Medicine

## 2013-11-19 ENCOUNTER — Ambulatory Visit: Payer: Self-pay | Admitting: Emergency Medicine

## 2013-11-30 ENCOUNTER — Ambulatory Visit (INDEPENDENT_AMBULATORY_CARE_PROVIDER_SITE_OTHER): Payer: Medicare Other | Admitting: Emergency Medicine

## 2013-11-30 ENCOUNTER — Encounter: Payer: Self-pay | Admitting: Emergency Medicine

## 2013-11-30 VITALS — BP 122/64 | HR 62 | Temp 98.2°F | Resp 16 | Ht 66.0 in | Wt 148.0 lb

## 2013-11-30 DIAGNOSIS — R7309 Other abnormal glucose: Secondary | ICD-10-CM

## 2013-11-30 DIAGNOSIS — R1013 Epigastric pain: Secondary | ICD-10-CM

## 2013-11-30 DIAGNOSIS — E782 Mixed hyperlipidemia: Secondary | ICD-10-CM

## 2013-11-30 DIAGNOSIS — K219 Gastro-esophageal reflux disease without esophagitis: Secondary | ICD-10-CM

## 2013-11-30 DIAGNOSIS — R5383 Other fatigue: Secondary | ICD-10-CM

## 2013-11-30 DIAGNOSIS — R634 Abnormal weight loss: Secondary | ICD-10-CM

## 2013-11-30 DIAGNOSIS — I10 Essential (primary) hypertension: Secondary | ICD-10-CM

## 2013-11-30 DIAGNOSIS — R5381 Other malaise: Secondary | ICD-10-CM

## 2013-11-30 LAB — CBC WITH DIFFERENTIAL/PLATELET
BASOS ABS: 0 10*3/uL (ref 0.0–0.1)
Basophils Relative: 0 % (ref 0–1)
EOS ABS: 0.2 10*3/uL (ref 0.0–0.7)
EOS PCT: 4 % (ref 0–5)
HEMATOCRIT: 38.8 % (ref 36.0–46.0)
Hemoglobin: 13.3 g/dL (ref 12.0–15.0)
Lymphocytes Relative: 28 % (ref 12–46)
Lymphs Abs: 1.6 10*3/uL (ref 0.7–4.0)
MCH: 30 pg (ref 26.0–34.0)
MCHC: 34.3 g/dL (ref 30.0–36.0)
MCV: 87.6 fL (ref 78.0–100.0)
MONO ABS: 0.5 10*3/uL (ref 0.1–1.0)
Monocytes Relative: 9 % (ref 3–12)
Neutro Abs: 3.4 10*3/uL (ref 1.7–7.7)
Neutrophils Relative %: 59 % (ref 43–77)
PLATELETS: 382 10*3/uL (ref 150–400)
RBC: 4.43 MIL/uL (ref 3.87–5.11)
RDW: 15.7 % — AB (ref 11.5–15.5)
WBC: 5.8 10*3/uL (ref 4.0–10.5)

## 2013-11-30 LAB — HEMOGLOBIN A1C
Hgb A1c MFr Bld: 5.8 % — ABNORMAL HIGH (ref <5.7)
Mean Plasma Glucose: 120 mg/dL — ABNORMAL HIGH (ref <117)

## 2013-11-30 MED ORDER — PREDNISONE 10 MG PO TABS: ORAL_TABLET | ORAL | Status: DC

## 2013-11-30 MED ORDER — AMLODIPINE BESYLATE 10 MG PO TABS: 10.0000 mg | ORAL_TABLET | Freq: Every day | ORAL | Status: DC

## 2013-11-30 NOTE — Progress Notes (Signed)
Subjective:    Patient ID: Catherine Parks, female    DOB: 07-16-1941, 73 y.o.   MRN: [id number]  HPI Comments: 73 yo AAF presents for 3 month F/U for HTN, Cholesterol, Pre-Dm, D. Deficient. She is eating less with decreased appetite and early satiety. She is eating healthy. She is not exercising due to increased fatigue.  CHOL         194   08/20/2013 HDL           75   08/20/2013 LDLCALC       93   08/20/2013 TRIG         131   08/20/2013 CHOLHDL      2.6   08/20/2013 ALT           13   08/20/2013 AST           15   08/20/2013 ALKPHOS       66   08/20/2013 BILITOT      0.3   08/20/2013 CREATININE     0.85   08/20/2013 BUN              12   08/20/2013 NA              139   08/20/2013 K               3.8   08/20/2013 CL              103   08/20/2013 CO2              30   08/20/2013 HGBA1C      5.9   08/20/2013 WBC      5.0   08/20/2013 HGB     12.4   08/20/2013 HCT     35.9   08/20/2013 MCV     85.3   08/20/2013 PLT      355   08/20/2013  She recently lost husband but notes her appetite was decreased before he passed. She has been having increasing burning in epigastric x 2 -3 weeks. She denies pain after eating, and radiation of pain. She occasionally has reflux at night, she notes eating helps. She is down 9# since march of last year.   Hypertension    Current Outpatient Prescriptions on File Prior to Visit  Medication Sig Dispense Refill  . amLODipine (NORVASC) 10 MG tablet Take 10 mg by mouth daily.        Marland Kitchen aspirin EC 81 MG tablet Take 81 mg by mouth 2 (two) times a week.        . Cholecalciferol (VITAMIN D) 2000 UNITS tablet Take 2,000 Units by mouth daily.       . cloNIDine (CATAPRES) 0.2 MG tablet Take 1 tablet (0.2 mg total) by mouth 2 (two) times daily.  180 tablet  1  . Flaxseed, Linseed, (FLAX SEED OIL) 1000 MG CAPS Take by mouth daily.      . Magnesium 250 MG TABS Take by mouth daily.      Marland Kitchen triamterene-hydrochlorothiazide (MAXZIDE-25) 37.5-25 MG per tablet Take 1 tablet by mouth  daily.  90 tablet  1   No current facility-administered medications on file prior to visit.   Allergies  Allergen Reactions  . Lemon Oil   . Shellfish Allergy    Past Medical History  Diagnosis Date  . Hypertension   . HSV-1 (herpes simplex virus 1) infection   . Diverticulosis   . Colon polyps  Review of Systems  Constitutional: Positive for fatigue and unexpected weight change.  Gastrointestinal: Positive for abdominal pain.  All other systems reviewed and are negative.   BP 122/64  Pulse 62  Temp(Src) 98.2 F (36.8 C) (Temporal)  Resp 16  Ht 5\' 6"  (1.676 m)  Wt 148 lb (67.132 kg)  BMI 23.90 kg/m2     Objective:   Physical Exam  Nursing note and vitals reviewed. Constitutional: She is oriented to person, place, and time. She appears well-developed and well-nourished. No distress.  HENT:  Head: Normocephalic and atraumatic.  Right Ear: External ear normal.  Left Ear: External ear normal.  Nose: Nose normal.  Mouth/Throat: Oropharynx is clear and moist.  Eyes: Conjunctivae and EOM are normal.  Neck: Normal range of motion. Neck supple. No JVD present. No thyromegaly present.  Cardiovascular: Normal rate, regular rhythm, normal heart sounds and intact distal pulses.   Pulmonary/Chest: Effort normal and breath sounds normal.  Abdominal: Soft. Bowel sounds are normal. She exhibits no distension and no mass. There is no tenderness. There is no rebound and no guarding.  Musculoskeletal: Normal range of motion. She exhibits no edema and no tenderness.  Lymphadenopathy:    She has no cervical adenopathy.  Neurological: She is alert and oriented to person, place, and time. No cranial nerve deficit.  Skin: Skin is warm and dry. No rash noted. No erythema. No pallor.  Psychiatric: She has a normal mood and affect. Her behavior is normal. Judgment and thought content normal.          Assessment & Plan:  1.  3 month F/U for HTN, Cholesterol, Pre-Dm, D. Deficient.  Needs healthy diet, cardio QD and obtain healthy weight. Check Labs, Check BP if >130/80 call office  2. Fatigue/Abdomen pain/ wt loss vs GERD-Get Abdomen/ Pelvis CT if negative ref to GI, PRED and Benadryl AD before/ after Procedure, Check labs, GERD diet explained

## 2013-11-30 NOTE — Patient Instructions (Signed)
FYI Acute Pancreatitis Acute pancreatitis is a disease in which the pancreas becomes suddenly irritated (inflamed). The pancreas is a large gland behind your stomach. The pancreas makes enzymes that help digest food. The pancreas also makes 2 hormones that help control your blood sugar. Acute pancreatitis happens when the enzymes attack and damage the pancreas. Most attacks last a couple of days and can cause serious problems. HOME CARE  Follow your doctor's diet instructions. You may need to avoid alcohol and limit fat in your diet.  Eat small meals often.  Drink enough fluids to keep your pee (urine) clear or pale yellow.  Only take medicines as told by your doctor.  Avoid drinking alcohol if it caused your disease.  Do not smoke.  Get plenty of rest.  Check your blood sugar at home as told by your doctor.  Keep all doctor visits as told. GET HELP RIGHT AWAY IF:   You are unable to eat or keep fluids down.  Your pain becomes severe.  You have a fever or lasting symptoms for more than 2 to 3 days.  You have a fever and your symptoms suddenly get worse.  Your skin or the white part of your eyes turn yellow (jaundice).  You throw up (vomit).  You feel dizzy, or you pass out (faint).  Your blood sugar is high (over 300 mg/dL).  You do not get better as quickly as expected.  You have new or worsening symptoms.  You have lasting pain, weakness, or feel sick to your stomach (nauseous).  You get better and then have another pain attack. MAKE SURE YOU:   Understand these instructions.  Will watch your condition.  Will get help right away if you are not doing well or get worse. Document Released: 02/19/2008 Document Revised: 03/03/2012 Document Reviewed: 12/12/2011 [facility] Patient Information 2014 [place]. Diet for Gastroesophageal Reflux Disease, Adult Reflux (acid reflux) is when acid from your stomach flows up into the esophagus. When acid comes in contact  with the esophagus, the acid causes irritation and soreness (inflammation) in the esophagus. When reflux happens often or so severely that it causes damage to the esophagus, it is called gastroesophageal reflux disease (GERD). Nutrition therapy can help ease the discomfort of GERD. FOODS OR DRINKS TO AVOID OR LIMIT  Smoking or chewing tobacco. Nicotine is one of the most potent stimulants to acid production in the gastrointestinal tract.  Caffeinated and decaffeinated coffee and black tea.  Regular or low-calorie carbonated beverages or energy drinks (caffeine-free carbonated beverages are allowed).   Strong spices, such as black pepper, white pepper, red pepper, cayenne, curry powder, and chili powder.  Peppermint or spearmint.  Chocolate.  High-fat foods, including meats and fried foods. Extra added fats including oils, butter, salad dressings, and nuts. Limit these to less than 8 tsp per day.  Fruits and vegetables if they are not tolerated, such as citrus fruits or tomatoes.  Alcohol.  Any food that seems to aggravate your condition. If you have questions regarding your diet, call your caregiver or a registered dietitian. OTHER THINGS THAT MAY HELP GERD INCLUDE:   Eating your meals slowly, in a relaxed setting.  Eating 5 to 6 small meals per day instead of 3 large meals.  Eliminating food for a period of time if it causes distress.  Not lying down until 3 hours after eating a meal.  Keeping the head of your bed raised 6 to 9 inches (15 to 23 cm) by using a  foam wedge or blocks under the legs of the bed. Lying flat may make symptoms worse.  Being physically active. Weight loss may be helpful in reducing reflux in overweight or obese adults.  Wear loose fitting clothing EXAMPLE MEAL PLAN This meal plan is approximately 2,000 calories based on [email or web address] meal planning guidelines. Breakfast   cup cooked oatmeal.  1 cup strawberries.  1 cup low-fat milk.  1 oz  almonds. Snack  1 cup cucumber slices.  6 oz yogurt (made from low-fat or fat-free milk). Lunch  2 slice whole-wheat bread.  2 oz sliced [place].  2 tsp mayonnaise.  1 cup blueberries.  1 cup snap peas. Snack  6 whole-wheat crackers.  1 oz string cheese. Dinner   cup brown rice.  1 cup mixed veggies.  1 tsp olive oil.  3 oz grilled fish. Document Released: 09/02/2005 Document Revised: 11/25/2011 Document Reviewed: 07/19/2011 [facility] [employer] Patient Information 2014 [place], [name].

## 2013-12-01 LAB — HEPATIC FUNCTION PANEL
ALT: 12 U/L (ref 0–35)
AST: 14 U/L (ref 0–37)
Albumin: 4 g/dL (ref 3.5–5.2)
Alkaline Phosphatase: 68 U/L (ref 39–117)
BILIRUBIN DIRECT: 0.1 mg/dL (ref 0.0–0.3)
BILIRUBIN INDIRECT: 0.2 mg/dL (ref 0.2–1.2)
BILIRUBIN TOTAL: 0.3 mg/dL (ref 0.2–1.2)
Total Protein: 7.2 g/dL (ref 6.0–8.3)

## 2013-12-01 LAB — BASIC METABOLIC PANEL WITH GFR
BUN: 11 mg/dL (ref 6–23)
CALCIUM: 10.6 mg/dL — AB (ref 8.4–10.5)
CHLORIDE: 101 meq/L (ref 96–112)
CO2: 30 mEq/L (ref 19–32)
CREATININE: 0.75 mg/dL (ref 0.50–1.10)
GFR, EST NON AFRICAN AMERICAN: 79 mL/min
Glucose, Bld: 116 mg/dL — ABNORMAL HIGH (ref 70–99)
Potassium: 3.3 mEq/L — ABNORMAL LOW (ref 3.5–5.3)
Sodium: 136 mEq/L (ref 135–145)

## 2013-12-01 LAB — LIPID PANEL
CHOL/HDL RATIO: 2.3 ratio
Cholesterol: 193 mg/dL (ref 0–200)
HDL: 83 mg/dL (ref >39)
LDL CALC: 93 mg/dL (ref 0–99)
TRIGLYCERIDES: 87 mg/dL (ref <150)
VLDL: 17 mg/dL (ref 0–40)

## 2013-12-01 LAB — TSH: TSH: 0.955 u[IU]/mL (ref 0.350–4.500)

## 2013-12-01 LAB — LIPASE: Lipase: 21 U/L (ref 0–75)

## 2013-12-01 LAB — AMYLASE: Amylase: 68 U/L (ref 0–105)

## 2013-12-01 LAB — INSULIN, FASTING: INSULIN FASTING, SERUM: 14 u[IU]/mL (ref 3–28)

## 2013-12-02 ENCOUNTER — Ambulatory Visit
Admission: RE | Admit: 2013-12-02 | Discharge: 2013-12-02 | Disposition: A | Discharge status: Home / Self Care | Payer: 59 | Source: Ambulatory Visit | Attending: Emergency Medicine | Admitting: Emergency Medicine

## 2013-12-02 ENCOUNTER — Other Ambulatory Visit: Payer: Self-pay | Admitting: Emergency Medicine

## 2013-12-02 DIAGNOSIS — R1013 Epigastric pain: Secondary | ICD-10-CM

## 2013-12-02 DIAGNOSIS — R5383 Other fatigue: Secondary | ICD-10-CM

## 2013-12-02 DIAGNOSIS — R634 Abnormal weight loss: Secondary | ICD-10-CM

## 2013-12-02 DIAGNOSIS — R5381 Other malaise: Secondary | ICD-10-CM

## 2013-12-02 MED ORDER — IOHEXOL 300 MG/ML  SOLN
100.0000 mL | Freq: Once | INTRAMUSCULAR | Status: AC | PRN
  Administered 2013-12-02: 100 mL via INTRAVENOUS

## 2013-12-07 ENCOUNTER — Ambulatory Visit
Admission: RE | Admit: 2013-12-07 | Discharge: 2013-12-07 | Disposition: A | Discharge status: Home / Self Care | Payer: Medicare Other | Source: Ambulatory Visit | Attending: Emergency Medicine | Admitting: Emergency Medicine

## 2013-12-07 DIAGNOSIS — R634 Abnormal weight loss: Secondary | ICD-10-CM

## 2014-02-18 ENCOUNTER — Ambulatory Visit: Payer: Self-pay | Admitting: Physician Assistant

## 2014-03-03 ENCOUNTER — Ambulatory Visit (INDEPENDENT_AMBULATORY_CARE_PROVIDER_SITE_OTHER): Payer: Medicare Other | Admitting: Physician Assistant

## 2014-03-03 ENCOUNTER — Encounter: Payer: Self-pay | Admitting: Physician Assistant

## 2014-03-03 VITALS — BP 122/74 | HR 68 | Temp 97.5°F | Resp 16 | Wt 147.4 lb

## 2014-03-03 DIAGNOSIS — Z789 Other specified health status: Secondary | ICD-10-CM

## 2014-03-03 DIAGNOSIS — R7309 Other abnormal glucose: Secondary | ICD-10-CM

## 2014-03-03 DIAGNOSIS — Z0000 Encounter for general adult medical examination without abnormal findings: Secondary | ICD-10-CM

## 2014-03-03 DIAGNOSIS — Z1331 Encounter for screening for depression: Secondary | ICD-10-CM

## 2014-03-03 DIAGNOSIS — Z79899 Other long term (current) drug therapy: Secondary | ICD-10-CM

## 2014-03-03 DIAGNOSIS — E782 Mixed hyperlipidemia: Secondary | ICD-10-CM

## 2014-03-03 DIAGNOSIS — I10 Essential (primary) hypertension: Secondary | ICD-10-CM

## 2014-03-03 DIAGNOSIS — E559 Vitamin D deficiency, unspecified: Secondary | ICD-10-CM

## 2014-03-03 LAB — CBC WITH DIFFERENTIAL/PLATELET
BASOS ABS: 0.1 10*3/uL (ref 0.0–0.1)
Basophils Relative: 1 % (ref 0–1)
EOS PCT: 6 % — AB (ref 0–5)
Eosinophils Absolute: 0.3 10*3/uL (ref 0.0–0.7)
HEMATOCRIT: 39.1 % (ref 36.0–46.0)
HEMOGLOBIN: 13.4 g/dL (ref 12.0–15.0)
LYMPHS PCT: 39 % (ref 12–46)
Lymphs Abs: 2 10*3/uL (ref 0.7–4.0)
MCH: 30 pg (ref 26.0–34.0)
MCHC: 34.3 g/dL (ref 30.0–36.0)
MCV: 87.5 fL (ref 78.0–100.0)
MONO ABS: 0.5 10*3/uL (ref 0.1–1.0)
MONOS PCT: 10 % (ref 3–12)
NEUTROS ABS: 2.2 10*3/uL (ref 1.7–7.7)
Neutrophils Relative %: 44 % (ref 43–77)
Platelets: 359 10*3/uL (ref 150–400)
RBC: 4.47 MIL/uL (ref 3.87–5.11)
RDW: 15.9 % — AB (ref 11.5–15.5)
WBC: 5 10*3/uL (ref 4.0–10.5)

## 2014-03-03 NOTE — Patient Instructions (Signed)

## 2014-03-03 NOTE — Progress Notes (Signed)
MEDICARE ANNUAL WELLNESS VISIT AND FOLLOW UP  Assessment:   1. Hypertension - CBC with Differential - BASIC METABOLIC PANEL WITH GFR - Hepatic function panel - TSH  2. Mixed hyperlipidemia - Lipid panel  3. Other abnormal glucose Discussed general issues about diabetes pathophysiology and management., Educational material distributed., Suggested low cholesterol diet., Encouraged aerobic exercise., Discussed foot care., Reminded to get yearly retinal exam. - Hemoglobin A1c - Insulin, fasting - HM DIABETES FOOT EXAM  4. Unspecified vitamin D deficiency - Vit D  25 hydroxy (rtn osteoporosis monitoring)  5. Encounter for long-term (current) use of other medications - Magnesium  6. Diarrhea Improved with stopping magnesium, mild LLQ pain with history of diverticulosis, check CBC, do bland diet/liquids for 3-5 days  If pain is worse or fever/chills call office for ABX or go to ER   Needs to order DEXA at [facility]   Plan:   During the course of the visit the patient was educated and counseled about appropriate screening and preventive services including:    Pneumococcal vaccine   Influenza vaccine  Td vaccine  Screening electrocardiogram  Screening mammography  Bone densitometry screening  Colorectal cancer screening  Diabetes screening  Glaucoma screening  Nutrition counseling   Advanced directives: given info/requested  Screening recommendations, referrals:  Vaccinations: Tdap vaccine not indicated Influenza vaccine declined Pneumococcal vaccine not indicated Shingles vaccine declined Hep B vaccine not indicated  Nutrition assessed and recommended  Colonoscopy due 2017 Mammogram due next year Pap smear not indicated Pelvic exam not indicated Recommended yearly ophthalmology/optometry visit for glaucoma screening and checkup Recommended yearly dental visit for hygiene and checkup Advanced directives - requested  Conditions/risks identified: BMI:  Discussed weight loss, diet, and increase physical activity.  Increase physical activity: AHA recommends 150 minutes of physical activity a week.  Medications reviewed DEXA- wants to wait until her physical to get it Diabetes is at goal, ACE/ARB therapy: No, Reason not on Ace Inhibitor/ARB therapy:  predm Urinary Incontinence is not an issue: discussed non pharmacology and pharmacology options.  Fall risk: low- discussed PT, home fall assessment, medications.    Subjective:   Catherine Parks is a 73 y.o. female who presents for Medicare Annual Wellness Visit and 3 month follow up on hypertension, prediabetes, hyperlipidemia, vitamin D def.  Date of last medicare wellness visit is unknown.   Her blood pressure has been controlled at home, today their BP is BP: 122/74 mmHg She does not workout but plans on joining the [employer] with her grandkids. She denies chest pain, shortness of breath, dizziness.  She is not on cholesterol medication and denies myalgias. Her cholesterol is at goal. The cholesterol last visit was:   Lab Results  Component Value Date   CHOL 193 11/30/2013   HDL 83 11/30/2013   LDLCALC 93 11/30/2013   TRIG 87 11/30/2013   CHOLHDL 2.3 11/30/2013   She has been working on diet and exercise for prediabetes, and denies paresthesia of the feet, polydipsia and polyuria. Last A1C in the office was:  Lab Results  Component Value Date   HGBA1C 5.8* 11/30/2013   Patient is on Vitamin D supplement. She states that she had to stop the magnesium due to diarrhea, she states that since she has stopped it has resolved. Denies fever, chills, still have some lower quadrant pain.  Husband has been sick for a long time and passed in March. She states she is handling it well, has a good support system with her church and family.  Names of Other Physician/Practitioners you currently use: 1. [facility]- here for primary care 2. Dentist: Dr. Freda Munro q 3  months Patient Care Team: Unk Pinto, MD as PCP - General (Internal Medicine) Josephina Gip, MD as Consulting Physician (Optometry)- last summer Sanda Klein, MD as Consulting Physician (Cardiology) Sable Feil, MD as Consulting Physician (Gastroenterology) Mickle Plumb (Gynecology) Simona Huh, MD as Consulting Physician (Dermatology) Volanda Napoleon, MD as Consulting Physician (Oncology)  Medication Review Current Outpatient Prescriptions on File Prior to Visit  Medication Sig Dispense Refill  . amLODipine (NORVASC) 10 MG tablet Take 1 tablet (10 mg total) by mouth daily.  90 tablet  1  . aspirin EC 81 MG tablet Take 81 mg by mouth 2 (two) times a week.        . Cholecalciferol (VITAMIN D) 2000 UNITS tablet Take 2,000 Units by mouth daily.       . cloNIDine (CATAPRES) 0.2 MG tablet Take 1 tablet (0.2 mg total) by mouth 2 (two) times daily.  180 tablet  1  . Flaxseed, Linseed, (FLAX SEED OIL) 1000 MG CAPS Take by mouth daily.      . Magnesium 250 MG TABS Take by mouth daily.      . predniSONE (DELTASONE) 10 MG tablet Take TID day before procedure, TID day of and day after procedure then BID x 1 day then QD x 2 days  15 tablet  0  . triamterene-hydrochlorothiazide (MAXZIDE-25) 37.5-25 MG per tablet Take 1 tablet by mouth daily.  90 tablet  1   No current facility-administered medications on file prior to visit.    Current Problems (verified) Patient Active Problem List   Diagnosis Date Noted  . Mixed hyperlipidemia 08/19/2013  . Other abnormal glucose 08/19/2013  . Unspecified vitamin D deficiency 08/19/2013  . Hypertension   . HSV-1 (herpes simplex virus 1) infection   . Diverticulosis   . Colon polyps   . CONSTIPATION 10/12/2010    Screening Tests Health Maintenance  Topic Date Due  . Zostavax  10/26/2000  . Mammogram  10/17/2013  . Influenza Vaccine  04/16/2014  . Colonoscopy  09/16/2020  . Tetanus/tdap  05/07/2022  . Pneumococcal  Polysaccharide Vaccine Age 28 And Over  Completed     Immunization History  Administered Date(s) Administered  . DTaP 07/30/2011  . Pneumococcal Polysaccharide-23 05/11/2013  . Pneumococcal-Unspecified 01/07/2002  . Td 01/07/2002, 05/07/2012    Preventative care: Last colonoscopy: 2012 due 2017 EGD: 2012 Last mammogram: 01/2014- had normal [place] Last pap smear/pelvic exam: 2013 DEXA: 2013 normal Due Echo 2009 EF 60%  Prior vaccinations: TD or Tdap: 2013  Influenza: declines Pneumococcal: 2014 Shingles/Zostavax: declines  History reviewed: allergies, current medications, past family history, past medical history, past social history, past surgical history and problem list  Risk Factors: Osteoporosis: postmenopausal estrogen deficiency and dietary calcium and/or vitamin D deficiency History of fracture in the past year: no  Tobacco History  Substance Use Topics  . Smoking status: Current Every Day Smoker -- 0.50 packs/day    Types: Cigarettes  . Smokeless tobacco: Not on file  . Alcohol Use: No   She does smoke.   Are there smokers in your home (other than you)?  Yes  Alcohol Current alcohol use: none  Caffeine Current caffeine use: coffee 1 /day  Exercise Current exercise: none  Nutrition/Diet Current diet: in general, a "healthy" diet    Cardiac risk factors: advanced age (older than 55 for men,  12 for women), hypertension and sedentary lifestyle.  Depression Screen (Note: if answer to either of the following is "Yes", a more complete depression screening is indicated)   Q1: Over the past two weeks, have you felt down, depressed or hopeless? No  Q2: Over the past two weeks, have you felt little interest or pleasure in doing things? No  Have you lost interest or pleasure in daily life? No  Do you often feel hopeless? No  Do you cry easily over simple problems? No  Activities of Daily Living In your present state of health, do you have any difficulty  performing the following activities?:  Driving? No Managing money?  No Feeding yourself? No Getting from bed to chair? No Climbing a flight of stairs? No Preparing food and eating?: No Bathing or showering? No Getting dressed: No Getting to the toilet? No Using the toilet:No Moving around from place to place: No In the past year have you fallen or had a near fall?:No   Are you sexually active?  No  Do you have more than one partner?  No  Vision Difficulties: No  Hearing Difficulties: No Do you often ask people to speak up or repeat themselves? No Do you experience ringing or noises in your ears? No Do you have difficulty understanding soft or whispered voices? No  Cognition  Do you feel that you have a problem with memory?No  Do you often misplace items? No  Do you feel safe at home?  Yes  Advanced directives Does patient have a Annex? No Does patient have a Living Will? No   Objective:   Blood pressure 122/74, pulse 68, temperature 97.5 F (36.4 C), resp. rate 16, weight 147 lb 6.4 oz (66.86 kg). Body mass index is 23.8 kg/(m^2).  General appearance: alert, no distress, WD/WN,  female Cognitive Testing  Alert? Yes  Normal Appearance?Yes  Oriented to person? Yes  Place? Yes   Time? Yes  Recall of three objects?  Yes  Can perform simple calculations? Yes  Displays appropriate judgment?Yes  Can read the correct time from a watch face?Yes  HEENT: normocephalic, sclerae anicteric, TMs pearly, nares patent, no discharge or erythema, pharynx normal Oral cavity: MMM, no lesions Neck: supple, no lymphadenopathy, no thyromegaly, no masses Heart: RRR, normal S1, S2, no murmurs Lungs: CTA bilaterally, no wheezes, rhonchi, or rales Abdomen: +bs, soft, mild LLQ tenderness without rebound, non distended, no masses, no hepatomegaly, no splenomegaly Musculoskeletal: nontender, no swelling, no obvious deformity Extremities: no edema, no cyanosis, no  clubbing Pulses: 2+ symmetric, upper and lower extremities, normal cap refill Neurological: alert, oriented x 3, CN2-12 intact, strength normal upper extremities and lower extremities, sensation normal throughout, DTRs 2+ throughout, no cerebellar signs, gait normal Psychiatric: normal affect, behavior normal, pleasant  Breast: defer Gyn: defer Rectal: defer  Medicare Attestation I have personally reviewed: The patient's medical and social history Their use of alcohol, tobacco or illicit drugs Their current medications and supplements The patient's functional ability including ADLs,fall risks, home safety risks, cognitive, and hearing and visual impairment Diet and physical activities Evidence for depression or mood disorders  The patient's weight, height, BMI, and visual acuity have been recorded in the chart.  I have made referrals, counseling, and provided education to the patient based on review of the above and I have provided the patient with a written personalized care plan for preventive services.     Vicie Mutters, PA-C   03/03/2014

## 2014-03-04 ENCOUNTER — Telehealth: Payer: Self-pay

## 2014-03-04 LAB — BASIC METABOLIC PANEL WITH GFR
BUN: 14 mg/dL (ref 6–23)
CHLORIDE: 102 meq/L (ref 96–112)
CO2: 26 mEq/L (ref 19–32)
CREATININE: 0.88 mg/dL (ref 0.50–1.10)
Calcium: 10.3 mg/dL (ref 8.4–10.5)
GFR, EST NON AFRICAN AMERICAN: 65 mL/min
GFR, Est African American: 75 mL/min
GLUCOSE: 83 mg/dL (ref 70–99)
POTASSIUM: 4 meq/L (ref 3.5–5.3)
Sodium: 139 mEq/L (ref 135–145)

## 2014-03-04 LAB — TSH: TSH: 1.152 u[IU]/mL (ref 0.350–4.500)

## 2014-03-04 LAB — LIPID PANEL
Cholesterol: 190 mg/dL (ref 0–200)
HDL: 69 mg/dL (ref >39)
LDL CALC: 95 mg/dL (ref 0–99)
Total CHOL/HDL Ratio: 2.8 Ratio
Triglycerides: 129 mg/dL (ref <150)
VLDL: 26 mg/dL (ref 0–40)

## 2014-03-04 LAB — HEMOGLOBIN A1C
Hgb A1c MFr Bld: 6.1 % — ABNORMAL HIGH (ref <5.7)
Mean Plasma Glucose: 128 mg/dL — ABNORMAL HIGH (ref <117)

## 2014-03-04 LAB — INSULIN, FASTING: INSULIN FASTING, SERUM: 14 u[IU]/mL (ref 3–28)

## 2014-03-04 LAB — HEPATIC FUNCTION PANEL
ALBUMIN: 3.9 g/dL (ref 3.5–5.2)
ALK PHOS: 61 U/L (ref 39–117)
ALT: 14 U/L (ref 0–35)
AST: 19 U/L (ref 0–37)
BILIRUBIN INDIRECT: 0.3 mg/dL (ref 0.2–1.2)
Bilirubin, Direct: 0.1 mg/dL (ref 0.0–0.3)
TOTAL PROTEIN: 7.5 g/dL (ref 6.0–8.3)
Total Bilirubin: 0.4 mg/dL (ref 0.2–1.2)

## 2014-03-04 LAB — MAGNESIUM: MAGNESIUM: 2 mg/dL (ref 1.5–2.5)

## 2014-03-04 LAB — VITAMIN D 25 HYDROXY (VIT D DEFICIENCY, FRACTURES): VIT D 25 HYDROXY: 56 ng/mL (ref 30–89)

## 2014-03-04 NOTE — Telephone Encounter (Signed)
Message copied by Nadyne Coombes on Fri Mar 04, 2014  9:43 AM ------      Message from: Vicie Mutters R      Created: Fri Mar 04, 2014  8:12 AM       All of your labs are normal except:      CBC is normal, abdominal pain is likely NOT diverticulitis. Continue bland foods and do not restart Magnesium.       Your AIC is in prediabetic range which is between 5.7 and 6.4. This is a warning sign for diabetes. Your A1C is a measure of your sugar over the past 3 months and is not affected by what you have eaten over the past few days. Diabetes increases your chances of stroke and heart attack over 300 % and is the leading cause of blindness and kidney failure in the [place]. Please make sure you decrease bad carbs like white bread, white rice, potatoes, corn, soft drinks, pasta, cereals, refined sugars, sweet tea, dried fruits, and fruit juice. Good carbs are okay to eat in moderation like sweet potatoes, brown rice, whole grain pasta/bread, most fruit (except dried fruit) and you can eat as many veggies as you want.        ------

## 2014-03-04 NOTE — Telephone Encounter (Signed)
lmom for pt to return call for lab results.

## 2014-05-02 ENCOUNTER — Other Ambulatory Visit: Payer: Self-pay | Admitting: Internal Medicine

## 2014-05-09 ENCOUNTER — Other Ambulatory Visit: Payer: Self-pay | Admitting: Internal Medicine

## 2014-05-12 ENCOUNTER — Encounter: Payer: Self-pay | Admitting: Emergency Medicine

## 2014-05-26 ENCOUNTER — Other Ambulatory Visit: Payer: Self-pay | Admitting: Emergency Medicine

## 2014-06-08 ENCOUNTER — Encounter: Payer: Self-pay | Admitting: Internal Medicine

## 2014-06-08 ENCOUNTER — Encounter: Payer: Self-pay | Admitting: Physician Assistant

## 2014-06-08 ENCOUNTER — Ambulatory Visit (INDEPENDENT_AMBULATORY_CARE_PROVIDER_SITE_OTHER): Payer: Medicare Other | Admitting: Internal Medicine

## 2014-06-08 VITALS — BP 126/76 | HR 64 | Temp 97.5°F | Resp 16 | Ht 67.25 in | Wt 146.0 lb

## 2014-06-08 DIAGNOSIS — Z1331 Encounter for screening for depression: Secondary | ICD-10-CM

## 2014-06-08 DIAGNOSIS — E782 Mixed hyperlipidemia: Secondary | ICD-10-CM

## 2014-06-08 DIAGNOSIS — Z79899 Other long term (current) drug therapy: Secondary | ICD-10-CM | POA: Insufficient documentation

## 2014-06-08 DIAGNOSIS — N183 Chronic kidney disease, stage 3 (moderate): Secondary | ICD-10-CM

## 2014-06-08 DIAGNOSIS — Z0000 Encounter for general adult medical examination without abnormal findings: Secondary | ICD-10-CM

## 2014-06-08 DIAGNOSIS — R7309 Other abnormal glucose: Secondary | ICD-10-CM

## 2014-06-08 DIAGNOSIS — Z1212 Encounter for screening for malignant neoplasm of rectum: Secondary | ICD-10-CM

## 2014-06-08 DIAGNOSIS — I10 Essential (primary) hypertension: Secondary | ICD-10-CM

## 2014-06-08 DIAGNOSIS — E559 Vitamin D deficiency, unspecified: Secondary | ICD-10-CM

## 2014-06-08 DIAGNOSIS — N182 Chronic kidney disease, stage 2 (mild): Secondary | ICD-10-CM

## 2014-06-08 DIAGNOSIS — Z789 Other specified health status: Secondary | ICD-10-CM

## 2014-06-08 LAB — CBC WITH DIFFERENTIAL/PLATELET
BASOS ABS: 0 10*3/uL (ref 0.0–0.1)
Basophils Relative: 0 % (ref 0–1)
EOS PCT: 6 % — AB (ref 0–5)
Eosinophils Absolute: 0.3 10*3/uL (ref 0.0–0.7)
HCT: 38.3 % (ref 36.0–46.0)
Hemoglobin: 13 g/dL (ref 12.0–15.0)
LYMPHS PCT: 38 % (ref 12–46)
Lymphs Abs: 1.9 10*3/uL (ref 0.7–4.0)
MCH: 30.1 pg (ref 26.0–34.0)
MCHC: 33.9 g/dL (ref 30.0–36.0)
MCV: 88.7 fL (ref 78.0–100.0)
Monocytes Absolute: 0.5 10*3/uL (ref 0.1–1.0)
Monocytes Relative: 10 % (ref 3–12)
Neutro Abs: 2.3 10*3/uL (ref 1.7–7.7)
Neutrophils Relative %: 46 % (ref 43–77)
PLATELETS: 362 10*3/uL (ref 150–400)
RBC: 4.32 MIL/uL (ref 3.87–5.11)
RDW: 16.4 % — AB (ref 11.5–15.5)
WBC: 5 10*3/uL (ref 4.0–10.5)

## 2014-06-08 MED ORDER — TRIAMTERENE-HCTZ 37.5-25 MG PO TABS: ORAL_TABLET | ORAL | Status: DC

## 2014-06-08 MED ORDER — AMLODIPINE BESYLATE 10 MG PO TABS: ORAL_TABLET | ORAL | Status: DC

## 2014-06-08 NOTE — Patient Instructions (Signed)
Recommend the book "The END of DIETING" by Dr Baker Janus   and the book "The END of DIABETES " by Dr Excell Seltzer  At [email or web address] - get book & Audio CD's      Being diabetic has a  300% increased risk for heart attack, stroke, cancer, and alzheimer- type vascular dementia. It is very important that you work harder with diet by avoiding all foods that are white except chicken & fish. Avoid white rice (brown & wild rice is OK), white potatoes (sweetpotatoes in moderation is OK), White bread or wheat bread or anything made out of white flour like bagels, donuts, rolls, buns, biscuits, cakes, pastries, cookies, pizza crust, and pasta (made from white flour & egg whites) - vegetarian pasta or spinach or wheat pasta is OK. Multigrain breads like Arnold's or [employer], or multigrain sandwich thins or flatbreads.  Diet, exercise and weight loss can reverse and cure diabetes in the early stages.  Diet, exercise and weight loss is very important in the control and prevention of complications of diabetes which affects every system in your body, ie. Brain - dementia/stroke, eyes - glaucoma/blindness, heart - heart attack/heart failure, kidneys - dialysis, stomach - gastric paralysis, intestines - malabsorption, nerves - severe painful neuritis, circulation - gangrene & loss of a leg(s), and finally cancer and Alzheimers.    I recommend avoid fried & greasy foods,  sweets/candy, white rice (brown or wild rice or Quinoa is OK), white potatoes (sweet potatoes are OK) - anything made from white flour - bagels, doughnuts, rolls, buns, biscuits,white and wheat breads, pizza crust and traditional pasta made of white flour & egg white(vegetarian pasta or spinach or wheat pasta is OK).  Multi-grain bread is OK - like multi-grain flat bread or sandwich thins. Avoid alcohol in excess. Exercise is also important.    Eat all the vegetables you want - avoid meat, especially red meat and dairy - especially cheese.  Cheese  is the most concentrated form of trans-fats which is the worst thing to clog up our arteries. Veggie cheese is OK which can be found in the fresh produce section at [employer] or [employer] or [employer]  Preventive Care for Adults A healthy lifestyle and preventive care can promote health and wellness. Preventive health guidelines for women include the following key practices.  A routine yearly physical is a good way to check with your health care provider about your health and preventive screening. It is a chance to share any concerns and updates on your health and to receive a thorough exam.  Visit your dentist for a routine exam and preventive care every 6 months. Brush your teeth twice a day and floss once a day. Good oral hygiene prevents tooth decay and gum disease.  The frequency of eye exams is based on your age, health, family medical history, use of contact lenses, and other factors. Follow your health care provider's recommendations for frequency of eye exams.  Eat a healthy diet. Foods like vegetables, fruits, whole grains, low-fat dairy products, and lean protein foods contain the nutrients you need without too many calories. Decrease your intake of foods high in solid fats, added sugars, and salt. Eat the right amount of calories for you.Get information about a proper diet from your health care provider, if necessary.  Regular physical exercise is one of the most important things you can do for your health. Most adults should get at least 150 minutes of moderate-intensity exercise (any activity that increases  your heart rate and causes you to sweat) each week. In addition, most adults need muscle-strengthening exercises on 2 or more days a week.  Maintain a healthy weight. The body mass index (BMI) is a screening tool to identify possible weight problems. It provides an estimate of body fat based on height and weight. Your health care provider can find your BMI and can help you  achieve or maintain a healthy weight.For adults 20 years and older:  A BMI below 18.5 is considered underweight.  A BMI of 18.5 to 24.9 is normal.  A BMI of 25 to 29.9 is considered overweight.  A BMI of 30 and above is considered obese.  Maintain normal blood lipids and cholesterol levels by exercising and minimizing your intake of saturated fat. Eat a balanced diet with plenty of fruit and vegetables. Blood tests for lipids and cholesterol should begin at age 64 and be repeated every 5 years. If your lipid or cholesterol levels are high, you are over 50, or you are at high risk for heart disease, you may need your cholesterol levels checked more frequently.Ongoing high lipid and cholesterol levels should be treated with medicines if diet and exercise are not working.  If you smoke, find out from your health care provider how to quit. If you do not use tobacco, do not start.  Lung cancer screening is recommended for adults aged 73-80 years who are at high risk for developing lung cancer because of a history of smoking. A yearly low-dose CT scan of the lungs is recommended for people who have at least a 30-pack-year history of smoking and are a current smoker or have quit within the past 15 years. A pack year of smoking is smoking an average of 1 pack of cigarettes a day for 1 year (for example: 1 pack a day for 30 years or 2 packs a day for 15 years). Yearly screening should continue until the smoker has stopped smoking for at least 15 years. Yearly screening should be stopped for people who develop a health problem that would prevent them from having lung cancer treatment.  If you are pregnant, do not drink alcohol. If you are breastfeeding, be very cautious about drinking alcohol. If you are not pregnant and choose to drink alcohol, do not have more than 1 drink per day. One drink is considered to be 12 ounces (355 mL) of beer, 5 ounces (148 mL) of wine, or 1.5 ounces (44 mL) of liquor.  Avoid  use of street drugs. Do not share needles with anyone. Ask for help if you need support or instructions about stopping the use of drugs.  High blood pressure causes heart disease and increases the risk of stroke. Your blood pressure should be checked at least every 1 to 2 years. Ongoing high blood pressure should be treated with medicines if weight loss and exercise do not work.  If you are 66-81 years old, ask your health care provider if you should take aspirin to prevent strokes.  Diabetes screening involves taking a blood sample to check your fasting blood sugar level. This should be done once every 3 years, after age 45, if you are within normal weight and without risk factors for diabetes. Testing should be considered at a younger age or be carried out more frequently if you are overweight and have at least 1 risk factor for diabetes.  Breast cancer screening is essential preventive care for women. You should practice "breast self-awareness." This means understanding the  normal appearance and feel of your breasts and may include breast self-examination. Any changes detected, no matter how small, should be reported to a health care provider. Women in their 77s and 30s should have a clinical breast exam (CBE) by a health care provider as part of a regular health exam every 1 to 3 years. After age 52, women should have a CBE every year. Starting at age 59, women should consider having a mammogram (breast X-ray test) every year. Women who have a family history of breast cancer should talk to their health care provider about genetic screening. Women at a high risk of breast cancer should talk to their health care providers about having an MRI and a mammogram every year.  Breast cancer gene (BRCA)-related cancer risk assessment is recommended for women who have family members with BRCA-related cancers. BRCA-related cancers include breast, ovarian, tubal, and peritoneal cancers. Having family members with  these cancers may be associated with an increased risk for harmful changes (mutations) in the breast cancer genes BRCA1 and BRCA2. Results of the assessment will determine the need for genetic counseling and BRCA1 and BRCA2 testing.  Routine pelvic exams to screen for cancer are no longer recommended for nonpregnant women who are considered low risk for cancer of the pelvic organs (ovaries, uterus, and vagina) and who do not have symptoms. Ask your health care provider if a screening pelvic exam is right for you.  If you have had past treatment for cervical cancer or a condition that could lead to cancer, you need Pap tests and screening for cancer for at least 20 years after your treatment. If Pap tests have been discontinued, your risk factors (such as having a new sexual partner) need to be reassessed to determine if screening should be resumed. Some women have medical problems that increase the chance of getting cervical cancer. In these cases, your health care provider may recommend more frequent screening and Pap tests.  The HPV test is an additional test that may be used for cervical cancer screening. The HPV test looks for the virus that can cause the cell changes on the cervix. The cells collected during the Pap test can be tested for HPV. The HPV test could be used to screen women aged 43 years and older, and should be used in women of any age who have unclear Pap test results. After the age of 50, women should have HPV testing at the same frequency as a Pap test.  Colorectal cancer can be detected and often prevented. Most routine colorectal cancer screening begins at the age of 30 years and continues through age 66 years. However, your health care provider may recommend screening at an earlier age if you have risk factors for colon cancer. On a yearly basis, your health care provider may provide home test kits to check for hidden blood in the stool. Use of a small camera at the end of a tube, to  directly examine the colon (sigmoidoscopy or colonoscopy), can detect the earliest forms of colorectal cancer. Talk to your health care provider about this at age 43, when routine screening begins. Direct exam of the colon should be repeated every 5-10 years through age 56 years, unless early forms of pre-cancerous polyps or small growths are found.  People who are at an increased risk for hepatitis B should be screened for this virus. You are considered at high risk for hepatitis B if:  You were born in a country where hepatitis B occurs  often. Talk with your health care provider about which countries are considered high risk.  Your parents were born in a high-risk country and you have not received a shot to protect against hepatitis B (hepatitis B vaccine).  You have HIV or AIDS.  You use needles to inject street drugs.  You live with, or have sex with, someone who has hepatitis B.  You get hemodialysis treatment.  You take certain medicines for conditions like cancer, organ transplantation, and autoimmune conditions.  Hepatitis C blood testing is recommended for all people born from [birth year] through 1965 and any individual with known risks for hepatitis C.  Practice safe sex. Use condoms and avoid high-risk sexual practices to reduce the spread of sexually transmitted infections (STIs). STIs include gonorrhea, chlamydia, syphilis, trichomonas, herpes, HPV, and human immunodeficiency virus (HIV). Herpes, HIV, and HPV are viral illnesses that have no cure. They can result in disability, cancer, and death.  You should be screened for sexually transmitted illnesses (STIs) including gonorrhea and chlamydia if:  You are sexually active and are younger than 24 years.  You are older than 24 years and your health care provider tells you that you are at risk for this type of infection.  Your sexual activity has changed since you were last screened and you are at an increased risk for chlamydia or  gonorrhea. Ask your health care provider if you are at risk.  If you are at risk of being infected with HIV, it is recommended that you take a prescription medicine daily to prevent HIV infection. This is called preexposure prophylaxis (PrEP). You are considered at risk if:  You are a heterosexual woman, are sexually active, and are at increased risk for HIV infection.  You take drugs by injection.  You are sexually active with a partner who has HIV.  Talk with your health care provider about whether you are at high risk of being infected with HIV. If you choose to begin PrEP, you should first be tested for HIV. You should then be tested every 3 months for as long as you are taking PrEP.  Osteoporosis is a disease in which the bones lose minerals and strength with aging. This can result in serious bone fractures or breaks. The risk of osteoporosis can be identified using a bone density scan. Women ages 35 years and over and women at risk for fractures or osteoporosis should discuss screening with their health care providers. Ask your health care provider whether you should take a calcium supplement or vitamin D to reduce the rate of osteoporosis.  Menopause can be associated with physical symptoms and risks. Hormone replacement therapy is available to decrease symptoms and risks. You should talk to your health care provider about whether hormone replacement therapy is right for you.  Use sunscreen. Apply sunscreen liberally and repeatedly throughout the day. You should seek shade when your shadow is shorter than you. Protect yourself by wearing long sleeves, pants, a wide-brimmed hat, and sunglasses year round, whenever you are outdoors.  Once a month, do a whole body skin exam, using a mirror to look at the skin on your back. Tell your health care provider of new moles, moles that have irregular borders, moles that are larger than a pencil eraser, or moles that have changed in shape or  color.  Stay current with required vaccines (immunizations).  Influenza vaccine. All adults should be immunized every year.  Tetanus, diphtheria, and acellular pertussis (Td, Tdap) vaccine. Pregnant women should receive  1 dose of Tdap vaccine during each pregnancy. The dose should be obtained regardless of the length of time since the last dose. Immunization is preferred during the 27th-36th week of gestation. An adult who has not previously received Tdap or who does not know her vaccine status should receive 1 dose of Tdap. This initial dose should be followed by tetanus and diphtheria toxoids (Td) booster doses every 10 years. Adults with an unknown or incomplete history of completing a 3-dose immunization series with Td-containing vaccines should begin or complete a primary immunization series including a Tdap dose. Adults should receive a Td booster every 10 years.  Varicella vaccine. An adult without evidence of immunity to varicella should receive 2 doses or a second dose if she has previously received 1 dose. Pregnant females who do not have evidence of immunity should receive the first dose after pregnancy. This first dose should be obtained before leaving the health care facility. The second dose should be obtained 4-8 weeks after the first dose.  Human papillomavirus (HPV) vaccine. Females aged 13-26 years who have not received the vaccine previously should obtain the 3-dose series. The vaccine is not recommended for use in pregnant females. However, pregnancy testing is not needed before receiving a dose. If a female is found to be pregnant after receiving a dose, no treatment is needed. In that case, the remaining doses should be delayed until after the pregnancy. Immunization is recommended for any person with an immunocompromised condition through the age of 32 years if she did not get any or all doses earlier. During the 3-dose series, the second dose should be obtained 4-8 weeks after the  first dose. The third dose should be obtained 24 weeks after the first dose and 16 weeks after the second dose.  Zoster vaccine. One dose is recommended for adults aged 26 years or older unless certain conditions are present.  Measles, mumps, and rubella (MMR) vaccine. Adults born before [birth year] generally are considered immune to measles and mumps. Adults born in [birth year] or later should have 1 or more doses of MMR vaccine unless there is a contraindication to the vaccine or there is laboratory evidence of immunity to each of the three diseases. A routine second dose of MMR vaccine should be obtained at least 28 days after the first dose for students attending postsecondary schools, health care workers, or international travelers. People who received inactivated measles vaccine or an unknown type of measles vaccine during 1963-1967 should receive 2 doses of MMR vaccine. People who received inactivated mumps vaccine or an unknown type of mumps vaccine before 1979 and are at high risk for mumps infection should consider immunization with 2 doses of MMR vaccine. For females of childbearing age, rubella immunity should be determined. If there is no evidence of immunity, females who are not pregnant should be vaccinated. If there is no evidence of immunity, females who are pregnant should delay immunization until after pregnancy. Unvaccinated health care workers born before [birth year] who lack laboratory evidence of measles, mumps, or rubella immunity or laboratory confirmation of disease should consider measles and mumps immunization with 2 doses of MMR vaccine or rubella immunization with 1 dose of MMR vaccine.  Pneumococcal 13-valent conjugate (PCV13) vaccine. When indicated, a person who is uncertain of her immunization history and has no record of immunization should receive the PCV13 vaccine. An adult aged 47 years or older who has certain medical conditions and has not been previously immunized should receive 1 dose of  PCV13 vaccine. This PCV13 should be followed with a dose of pneumococcal polysaccharide (PPSV23) vaccine. The PPSV23 vaccine dose should be obtained at least 8 weeks after the dose of PCV13 vaccine. An adult aged 36 years or older who has certain medical conditions and previously received 1 or more doses of PPSV23 vaccine should receive 1 dose of PCV13. The PCV13 vaccine dose should be obtained 1 or more years after the last PPSV23 vaccine dose.  Pneumococcal polysaccharide (PPSV23) vaccine. When PCV13 is also indicated, PCV13 should be obtained first. All adults aged 81 years and older should be immunized. An adult younger than age 69 years who has certain medical conditions should be immunized. Any person who resides in a nursing home or long-term care facility should be immunized. An adult smoker should be immunized. People with an immunocompromised condition and certain other conditions should receive both PCV13 and PPSV23 vaccines. People with human immunodeficiency virus (HIV) infection should be immunized as soon as possible after diagnosis. Immunization during chemotherapy or radiation therapy should be avoided. Routine use of PPSV23 vaccine is not recommended for American Indians, [place] Natives, or people younger than 65 years unless there are medical conditions that require PPSV23 vaccine. When indicated, people who have unknown immunization and have no record of immunization should receive PPSV23 vaccine. One-time revaccination 5 years after the first dose of PPSV23 is recommended for people aged 19-64 years who have chronic kidney failure, nephrotic syndrome, asplenia, or immunocompromised conditions. People who received 1-2 doses of PPSV23 before age 13 years should receive another dose of PPSV23 vaccine at age 52 years or later if at least 5 years have passed since the previous dose. Doses of PPSV23 are not needed for people immunized with PPSV23 at or after age 93 years.  Meningococcal vaccine.  Adults with asplenia or persistent complement component deficiencies should receive 2 doses of quadrivalent meningococcal conjugate (MenACWY-D) vaccine. The doses should be obtained at least 2 months apart. Microbiologists working with certain meningococcal bacteria, [employer] recruits, people at risk during an outbreak, and people who travel to or live in countries with a high rate of meningitis should be immunized. A first-year college student up through age 29 years who is living in a residence hall should receive a dose if she did not receive a dose on or after her 16th birthday. Adults who have certain high-risk conditions should receive one or more doses of vaccine.  Hepatitis A vaccine. Adults who wish to be protected from this disease, have certain high-risk conditions, work with hepatitis A-infected animals, work in hepatitis A research labs, or travel to or work in countries with a high rate of hepatitis A should be immunized. Adults who were previously unvaccinated and who anticipate close contact with an international adoptee during the first 60 days after arrival in the [place] from a country with a high rate of hepatitis A should be immunized.  Hepatitis B vaccine. Adults who wish to be protected from this disease, have certain high-risk conditions, may be exposed to blood or other infectious body fluids, are household contacts or sex partners of hepatitis B positive people, are clients or workers in certain care facilities, or travel to or work in countries with a high rate of hepatitis B should be immunized.  Haemophilus influenzae type b (Hib) vaccine. A previously unvaccinated person with asplenia or sickle cell disease or having a scheduled splenectomy should receive 1 dose of Hib vaccine. Regardless of previous immunization, a recipient of a hematopoietic stem  cell transplant should receive a 3-dose series 6-12 months after her successful transplant. Hib vaccine is not recommended for  adults with HIV infection. Preventive Services / Frequency  Ages 65 years and over  Blood pressure check.** / Every 1 to 2 years.  Lipid and cholesterol check.** / Every 5 years beginning at age 20 years.  Lung cancer screening. / Every year if you are aged 55-80 years and have a 30-pack-year history of smoking and currently smoke or have quit within the past 15 years. Yearly screening is stopped once you have quit smoking for at least 15 years or develop a health problem that would prevent you from having lung cancer treatment.  Clinical breast exam.** / Every year after age 40 years.  BRCA-related cancer risk assessment.** / For women who have family members with a BRCA-related cancer (breast, ovarian, tubal, or peritoneal cancers).  Mammogram.** / Every year beginning at age 40 years and continuing for as long as you are in good health. Consult with your health care provider.  Pap test.** / Every 3 years starting at age 30 years through age 65 or 70 years with 3 consecutive normal Pap tests. Testing can be stopped between 65 and 70 years with 3 consecutive normal Pap tests and no abnormal Pap or HPV tests in the past 10 years.  HPV screening.** / Every 3 years from ages 30 years through ages 65 or 70 years with a history of 3 consecutive normal Pap tests. Testing can be stopped between 65 and 70 years with 3 consecutive normal Pap tests and no abnormal Pap or HPV tests in the past 10 years.  Fecal occult blood test (FOBT) of stool. / Every year beginning at age 50 years and continuing until age 75 years. You may not need to do this test if you get a colonoscopy every 10 years.  Flexible sigmoidoscopy or colonoscopy.** / Every 5 years for a flexible sigmoidoscopy or every 10 years for a colonoscopy beginning at age 50 years and continuing until age 75 years.  Hepatitis C blood test.** / For all people born from 1945 through 1965 and any individual with known risks for hepatitis  C.  Osteoporosis screening.** / A one-time screening for women ages 65 years and over and women at risk for fractures or osteoporosis.  Skin self-exam. / Monthly.  Influenza vaccine. / Every year.  Tetanus, diphtheria, and acellular pertussis (Tdap/Td) vaccine.** / 1 dose of Td every 10 years.  Varicella vaccine.** / Consult your health care provider.  Zoster vaccine.** / 1 dose for adults aged 60 years or older.  Pneumococcal 13-valent conjugate (PCV13) vaccine.** / Consult your health care provider.  Pneumococcal polysaccharide (PPSV23) vaccine.** / 1 dose for all adults aged 65 years and older.  Meningococcal vaccine.** / Consult your health care provider.  Hepatitis A vaccine.** / Consult your health care provider.  Hepatitis B vaccine.** / Consult your health care provider.  Haemophilus influenzae type b (Hib) vaccine.** / Consult your health care provider.  

## 2014-06-08 NOTE — Progress Notes (Signed)
Patient ID: Catherine Parks, female   DOB: 1940-11-03, 73 y.o.   MRN: [id number]  Annual Preventative and  Comprehensive Examination  This very nice 73 y.o.WBF presents for complete physical.  Patient has been followed for HTN, T2_NIDDM  Prediabetes, Hyperlipidemia, and Vitamin D Deficiency.    HTN predates since age 36 (26). Patient's BP has been controlled at home and patient denies any cardiac symptoms as chest pain, palpitations, shortness of breath, dizziness or ankle swelling. Today's BP was  126/76 mmHg.    Patient's hyperlipidemia is controlled with diet and medications. Patient denies myalgias or other medication SE's. Last lipids were at goal - Total Chol  190; HDL 69; LDL 95; Trig 129 on 03/03/2014.   Patient has prediabetes  and patient denies reactive hypoglycemic symptoms, visual blurring, diabetic polys, or paresthesias. Last A1c was  6.1% on 03/03/2014.   Finally, patient has history of Vitamin D Deficiency and does supplement Vit D w/o SE's. Patient's last Vitamin D was  56 on 03/03/2014.  Medication Sig  . aspirin EC 81 MG tablet Take 81 mg by mouth 2 (two) times a week.    . Cholecalciferol (VITAMIN D) 2000 UNITS tablet Take 2,000 Units by mouth daily.   . cloNIDine (CATAPRES) 0.2 MG tablet TAKE 1 TABLET (0.2 MG TOTAL) BY MOUTH 2 (TWO) TIMES DAILY.  Marland Kitchen Flaxseed, Linseed, (FLAX SEED OIL) 1000 MG CAPS Take by mouth daily.  . Magnesium 250 MG TABS Take by mouth daily.   Allergies  Allergen Reactions  . Lemon Oil   . Shellfish Allergy    Past Medical History  Diagnosis Date  . Hypertension   . HSV-1 (herpes simplex virus 1) infection   . Diverticulosis   . Colon polyps    Past Surgical History  Procedure Laterality Date  . Abdominal hysterectomy    . Eye surgery      Colonoscopy  2004    cataracts   Family History  Problem Relation Age of Onset  . Hypertension Mother   . Stroke Mother   . Heart attack Father    History  Substance Use Topics  . Smoking  status: Current Every Day Smoker -- 0.50 packs/day    Types: Cigarettes  . Smokeless tobacco: Not on file  . Alcohol Use: No    ROS Constitutional: Denies fever, chills, weight loss/gain, headaches, insomnia, fatigue, night sweats, and change in appetite. Eyes: Denies redness, blurred vision, diplopia, discharge, itchy, watery eyes.  ENT: Denies discharge, congestion, post nasal drip, epistaxis, sore throat, earache, hearing loss, dental pain, Tinnitus, Vertigo, Sinus pain, snoring.  Cardio: Denies chest pain, palpitations, irregular heartbeat, syncope, dyspnea, diaphoresis, orthopnea, PND, claudication, edema Respiratory: denies cough, dyspnea, DOE, pleurisy, hoarseness, laryngitis, wheezing.  Gastrointestinal: Denies dysphagia, heartburn, reflux, water brash, pain, cramps, nausea, vomiting, bloating, diarrhea, constipation, hematemesis, melena, hematochezia, jaundice, hemorrhoids Genitourinary: Denies dysuria, frequency, urgency, nocturia, hesitancy, discharge, hematuria, flank pain Breast: Breast lumps, nipple discharge, bleeding.  Musculoskeletal: Denies arthralgia, myalgia, stiffness, Jt. Swelling, pain, limp, and strain/sprain. Denies falls. Skin: Denies puritis, rash, hives, warts, acne, eczema, changing in skin lesion Neuro: No weakness, tremor, incoordination, spasms, paresthesia, pain Psychiatric: Denies confusion, memory loss, sensory loss. Denies Depression. Endocrine: Denies change in weight, skin, hair change, nocturia, and paresthesia, diabetic polys, visual blurring, hyper / hypo glycemic episodes.  Heme/Lymph: No excessive bleeding, bruising, enlarged lymph nodes.  Physical Exam  BP 126/76  Pulse 64  Temp(Src) 97.5 F (36.4 C) (Temporal)  Resp 16  Ht 5' 7.25" (1.708  m)  Wt 146 lb (66.225 kg)  BMI 22.70 kg/m2  General Appearance: Well nourished and in no apparent distress. Eyes: PERRLA, EOMs, conjunctiva no swelling or erythema, normal fundi and vessels. Sinuses: No  frontal/maxillary tenderness ENT/Mouth: EACs patent / TMs  nl. Nares clear without erythema, swelling, mucoid exudates. Oral hygiene is good. No erythema, swelling, or exudate. Tongue normal, non-obstructing. Tonsils not swollen or erythematous. Hearing normal.  Neck: Supple, thyroid normal. No bruits, nodes or JVD. Respiratory: Respiratory effort normal.  BS equal and clear bilateral without rales, rhonci, wheezing or stridor. Cardio: Heart sounds are normal with regular rate and rhythm and no murmurs, rubs or gallops. Peripheral pulses are normal and equal bilaterally without edema. No aortic or femoral bruits. Chest: symmetric with normal excursions and percussion. Breasts: Symmetric, without lumps, nipple discharge, retractions, or fibrocystic changes.  Abdomen: Flat, soft, with bowl sounds. Nontender, no guarding, rebound, hernias, masses, or organomegaly.  Lymphatics: Non tender without lymphadenopathy.  Genitourinary:  Musculoskeletal: Full ROM all peripheral extremities, joint stability, 5/5 strength, and normal gait. Skin: Warm and dry without rashes, lesions, cyanosis, clubbing or  ecchymosis.  Neuro: Cranial nerves intact, reflexes equal bilaterally. Normal muscle tone, no cerebellar symptoms. Sensation intact.  Pysch: Awake and oriented X 3, normal affect, Insight and Judgment appropriate.   Assessment and Plan  1. Annual Screening Examination 2. Hypertension  3. Hyperlipidemia 4. Pre Diabetes 5. Vitamin D Deficiency  Continue prudent diet as discussed, weight control, BP monitoring, regular exercise, and medications. Discussed med's effects and SE's. Screening labs and tests as requested with regular follow-up as recommended.

## 2014-06-09 LAB — URINALYSIS, MICROSCOPIC ONLY
BACTERIA UA: NONE SEEN
CRYSTALS: NONE SEEN
Casts: NONE SEEN
SQUAMOUS EPITHELIAL / LPF: NONE SEEN

## 2014-06-09 LAB — MAGNESIUM: MAGNESIUM: 1.6 mg/dL (ref 1.5–2.5)

## 2014-06-09 LAB — BASIC METABOLIC PANEL WITH GFR
BUN: 11 mg/dL (ref 6–23)
CALCIUM: 10.7 mg/dL — AB (ref 8.4–10.5)
CHLORIDE: 100 meq/L (ref 96–112)
CO2: 27 mEq/L (ref 19–32)
CREATININE: 0.72 mg/dL (ref 0.50–1.10)
GFR, EST NON AFRICAN AMERICAN: 83 mL/min
Glucose, Bld: 89 mg/dL (ref 70–99)
Potassium: 3.6 mEq/L (ref 3.5–5.3)
Sodium: 140 mEq/L (ref 135–145)

## 2014-06-09 LAB — MICROALBUMIN / CREATININE URINE RATIO
Creatinine, Urine: 72.6 mg/dL
MICROALB/CREAT RATIO: 22 mg/g (ref 0.0–30.0)
Microalb, Ur: 1.6 mg/dL (ref <2.0)

## 2014-06-09 LAB — LIPID PANEL
Cholesterol: 189 mg/dL (ref 0–200)
HDL: 86 mg/dL (ref >39)
LDL Cholesterol: 89 mg/dL (ref 0–99)
TRIGLYCERIDES: 68 mg/dL (ref <150)
Total CHOL/HDL Ratio: 2.2 Ratio
VLDL: 14 mg/dL (ref 0–40)

## 2014-06-09 LAB — INSULIN, FASTING: INSULIN FASTING, SERUM: 4.1 u[IU]/mL (ref 2.0–19.6)

## 2014-06-09 LAB — HEPATIC FUNCTION PANEL
ALBUMIN: 4 g/dL (ref 3.5–5.2)
ALK PHOS: 56 U/L (ref 39–117)
ALT: 13 U/L (ref 0–35)
AST: 17 U/L (ref 0–37)
Bilirubin, Direct: 0.1 mg/dL (ref 0.0–0.3)
Indirect Bilirubin: 0.3 mg/dL (ref 0.2–1.2)
Total Bilirubin: 0.4 mg/dL (ref 0.2–1.2)
Total Protein: 7.6 g/dL (ref 6.0–8.3)

## 2014-06-09 LAB — HEMOGLOBIN A1C
Hgb A1c MFr Bld: 5.7 % — ABNORMAL HIGH (ref <5.7)
Mean Plasma Glucose: 117 mg/dL — ABNORMAL HIGH (ref <117)

## 2014-06-09 LAB — TSH: TSH: 1.244 u[IU]/mL (ref 0.350–4.500)

## 2014-06-09 LAB — VITAMIN D 25 HYDROXY (VIT D DEFICIENCY, FRACTURES): VIT D 25 HYDROXY: 61 ng/mL (ref 30–89)

## 2014-06-18 ENCOUNTER — Other Ambulatory Visit: Payer: Self-pay | Admitting: Internal Medicine

## 2014-08-03 ENCOUNTER — Other Ambulatory Visit (INDEPENDENT_AMBULATORY_CARE_PROVIDER_SITE_OTHER): Payer: Medicare Other | Admitting: *Deleted

## 2014-08-03 DIAGNOSIS — Z1212 Encounter for screening for malignant neoplasm of rectum: Secondary | ICD-10-CM

## 2014-08-03 LAB — POC HEMOCCULT BLD/STL (HOME/3-CARD/SCREEN)
Card #2 Fecal Occult Blod, POC: NEGATIVE
Card #3 Fecal Occult Blood, POC: NEGATIVE
FECAL OCCULT BLD: NEGATIVE

## 2014-08-06 ENCOUNTER — Encounter: Payer: Self-pay | Admitting: *Deleted

## 2014-09-27 ENCOUNTER — Ambulatory Visit (INDEPENDENT_AMBULATORY_CARE_PROVIDER_SITE_OTHER): Payer: Medicare Other | Admitting: Physician Assistant

## 2014-09-27 ENCOUNTER — Encounter: Payer: Self-pay | Admitting: Physician Assistant

## 2014-09-27 VITALS — BP 120/70 | HR 72 | Temp 97.9°F | Resp 16 | Ht 67.25 in | Wt 149.0 lb

## 2014-09-27 DIAGNOSIS — R7309 Other abnormal glucose: Secondary | ICD-10-CM

## 2014-09-27 DIAGNOSIS — R7303 Prediabetes: Secondary | ICD-10-CM

## 2014-09-27 DIAGNOSIS — I10 Essential (primary) hypertension: Secondary | ICD-10-CM

## 2014-09-27 DIAGNOSIS — K5791 Diverticulosis of intestine, part unspecified, without perforation or abscess with bleeding: Secondary | ICD-10-CM

## 2014-09-27 DIAGNOSIS — Z0001 Encounter for general adult medical examination with abnormal findings: Secondary | ICD-10-CM

## 2014-09-27 DIAGNOSIS — F17200 Nicotine dependence, unspecified, uncomplicated: Secondary | ICD-10-CM

## 2014-09-27 DIAGNOSIS — E782 Mixed hyperlipidemia: Secondary | ICD-10-CM

## 2014-09-27 DIAGNOSIS — N182 Chronic kidney disease, stage 2 (mild): Secondary | ICD-10-CM

## 2014-09-27 DIAGNOSIS — Z1331 Encounter for screening for depression: Secondary | ICD-10-CM

## 2014-09-27 DIAGNOSIS — Z79899 Other long term (current) drug therapy: Secondary | ICD-10-CM

## 2014-09-27 DIAGNOSIS — K635 Polyp of colon: Secondary | ICD-10-CM

## 2014-09-27 DIAGNOSIS — Z23 Encounter for immunization: Secondary | ICD-10-CM

## 2014-09-27 DIAGNOSIS — Z789 Other specified health status: Secondary | ICD-10-CM

## 2014-09-27 DIAGNOSIS — R6889 Other general symptoms and signs: Secondary | ICD-10-CM

## 2014-09-27 DIAGNOSIS — B009 Herpesviral infection, unspecified: Secondary | ICD-10-CM

## 2014-09-27 DIAGNOSIS — R0989 Other specified symptoms and signs involving the circulatory and respiratory systems: Secondary | ICD-10-CM

## 2014-09-27 DIAGNOSIS — E559 Vitamin D deficiency, unspecified: Secondary | ICD-10-CM

## 2014-09-27 LAB — MAGNESIUM: Magnesium: 2 mg/dL (ref 1.5–2.5)

## 2014-09-27 LAB — CBC WITH DIFFERENTIAL/PLATELET
Basophils Absolute: 0 10*3/uL (ref 0.0–0.1)
Basophils Relative: 0 % (ref 0–1)
EOS ABS: 0.3 10*3/uL (ref 0.0–0.7)
Eosinophils Relative: 6 % — ABNORMAL HIGH (ref 0–5)
HCT: 39.7 % (ref 36.0–46.0)
HEMOGLOBIN: 13.1 g/dL (ref 12.0–15.0)
Lymphocytes Relative: 36 % (ref 12–46)
Lymphs Abs: 2 10*3/uL (ref 0.7–4.0)
MCH: 30 pg (ref 26.0–34.0)
MCHC: 33 g/dL (ref 30.0–36.0)
MCV: 91.1 fL (ref 78.0–100.0)
MPV: 8.4 fL — AB (ref 8.6–12.4)
Monocytes Absolute: 0.6 10*3/uL (ref 0.1–1.0)
Monocytes Relative: 10 % (ref 3–12)
NEUTROS PCT: 48 % (ref 43–77)
Neutro Abs: 2.6 10*3/uL (ref 1.7–7.7)
Platelets: 374 10*3/uL (ref 150–400)
RBC: 4.36 MIL/uL (ref 3.87–5.11)
RDW: 16.1 % — AB (ref 11.5–15.5)
WBC: 5.5 10*3/uL (ref 4.0–10.5)

## 2014-09-27 LAB — HEPATIC FUNCTION PANEL
ALBUMIN: 3.8 g/dL (ref 3.5–5.2)
ALK PHOS: 56 U/L (ref 39–117)
ALT: 12 U/L (ref 0–35)
AST: 14 U/L (ref 0–37)
BILIRUBIN TOTAL: 0.4 mg/dL (ref 0.2–1.2)
Bilirubin, Direct: 0.1 mg/dL (ref 0.0–0.3)
Indirect Bilirubin: 0.3 mg/dL (ref 0.2–1.2)
TOTAL PROTEIN: 7.3 g/dL (ref 6.0–8.3)

## 2014-09-27 LAB — BASIC METABOLIC PANEL WITH GFR
BUN: 14 mg/dL (ref 6–23)
CHLORIDE: 101 meq/L (ref 96–112)
CO2: 27 mEq/L (ref 19–32)
Calcium: 10.6 mg/dL — ABNORMAL HIGH (ref 8.4–10.5)
Creat: 0.82 mg/dL (ref 0.50–1.10)
GFR, EST NON AFRICAN AMERICAN: 71 mL/min
GFR, Est African American: 82 mL/min
Glucose, Bld: 87 mg/dL (ref 70–99)
Potassium: 3.7 mEq/L (ref 3.5–5.3)
SODIUM: 138 meq/L (ref 135–145)

## 2014-09-27 LAB — LIPID PANEL
Cholesterol: 194 mg/dL (ref 0–200)
HDL: 79 mg/dL (ref >39)
LDL CALC: 93 mg/dL (ref 0–99)
TRIGLYCERIDES: 108 mg/dL (ref <150)
Total CHOL/HDL Ratio: 2.5 Ratio
VLDL: 22 mg/dL (ref 0–40)

## 2014-09-27 LAB — TSH: TSH: 1.394 u[IU]/mL (ref 0.350–4.500)

## 2014-09-27 LAB — HEMOGLOBIN A1C
Hgb A1c MFr Bld: 5.7 % — ABNORMAL HIGH (ref <5.7)
Mean Plasma Glucose: 117 mg/dL — ABNORMAL HIGH (ref <117)

## 2014-09-27 MED ORDER — VARENICLINE TARTRATE 1 MG PO TABS: ORAL_TABLET | ORAL | Status: DC

## 2014-09-27 NOTE — Patient Instructions (Signed)
We are giving you chantix for smoking cessation. You can do it! And we are here to help! You may have heard some scary side effects about chantix, the three most common I hear about are nausea, crazy dreams and depression.  However, I like for my patients to try to stay on 1/2 a tablet twice a day rather than one tablet twice a day as normally prescribed. This helps decrease the chances of side effects and helps save money by making a one month prescription last two months  Please start the prescription this way:  Start 1/2 tablet by mouth once daily after food with a full glass of water for 3 days Then do 1/2 tablet by mouth twice daily for 4 days.  At this point we have several options: 1) continue on 1/2 tablet twice a day- which I encourage you to do. You can stay on this dose the rest of the time on the medication or if you still feel the need to smoke you can do one of the two options below. 2) do one tablet in the morning and 1/2 in the evening which helps decrease dreams. 3) do one tablet twice a day.   What if I miss a dose? If you miss a dose, take it as soon as you can. If it is almost time for your next dose, take only that dose. Do not take double or extra doses.  What should I watch for while using this medicine? Visit your doctor or health care professional for regular check ups. Ask for ongoing advice and encouragement from your doctor or healthcare professional, friends, and family to help you quit. If you smoke while on this medication, quit again  Your mouth may get dry. Chewing sugarless gum or hard candy, and drinking plenty of water may help. Contact your doctor if the problem does not go away or is severe.  You may get drowsy or dizzy. Do not drive, use machinery, or do anything that needs mental alertness until you know how this medicine affects you. Do not stand or sit up quickly, especially if you are an older patient.   The use of this medicine may increase the chance  of suicidal thoughts or actions. Pay special attention to how you are responding while on this medicine. Any worsening of mood, or thoughts of suicide or dying should be reported to your health care professional right away.  ADVANTAGES OF QUITTING SMOKING 1. Within 20 minutes, blood pressure decreases. Your pulse is at normal level. 2. After 8 hours, carbon monoxide levels in the blood return to normal. Your oxygen level increases. 3. After 24 hours, the chance of having a heart attack starts to decrease. Your breath, hair, and body stop smelling like smoke. 4. After 48 hours, damaged nerve endings begin to recover. Your sense of taste and smell improve. 5. After 72 hours, the body is virtually free of nicotine. Your bronchial tubes relax and breathing becomes easier. 6. After 2 to 12 weeks, lungs can hold more air. Exercise becomes easier and circulation improves. 7. After 1 year, the risk of coronary heart disease is cut in half. 8. After 5 years, the risk of stroke falls to the same as a nonsmoker. 9. After 10 years, the risk of lung cancer is cut in half and the risk of other cancers decreases significantly. 10. After 15 years, the risk of coronary heart disease drops, usually to the level of a nonsmoker. 11. You will have extra money  to spend on things other than cigarettes     Bad carbs also include fruit juice, alcohol, and sweet tea. These are empty calories that do not signal to your brain that you are full.   Please remember the good carbs are still carbs which convert into sugar. So please measure them out no more than 1/2-1 cup of rice, oatmeal, pasta, and beans  Veggies are however free foods! Pile them on.   Not all fruit is created equal. Please see the list below, the fruit at the bottom is higher in sugars than the fruit at the top. Please avoid all dried fruits.

## 2014-09-27 NOTE — Progress Notes (Signed)
MEDICARE ANNUAL WELLNESS VISIT AND FOLLOW UP  Assessment:   1. Essential hypertension - continue medications, DASH diet, exercise and monitor at home. Call if greater than 130/80.  - CBC with Differential - Hepatic function panel - TSH  2. CKD Stage II (GFR 65 ml/min) Increase fluids, avoid NSAIDS, monitor sugars, will monitor - BASIC METABOLIC PANEL WITH GFR  3. Hyperlipidemia -continue medications, check lipids, decrease fatty foods, increase activity.  - Lipid panel  4. Prediabetes Discussed general issues about diabetes pathophysiology and management., Educational material distributed., Suggested low cholesterol diet., Encouraged aerobic exercise., Discussed foot care., Reminded to get yearly retinal exam. - Hemoglobin A1c  5. Diverticulosis of intestine with bleeding, unspecified intestinal tract location No LLQ pain, monitor closely  6. Colon polyps Due 2019  7. Vitamin D deficiency - Vit D  25 hydroxy (rtn osteoporosis monitoring)  8. Medication management - Magnesium  9. Tobacco use disorder Smoking cessation-  willing to try chantix, long discussion about how to take and AEs - varenicline (CHANTIX CONTINUING MONTH PAK) 1 MG tablet; Take 1 pill twice daily OR as directed  Dispense: 56 tablet; Refill: 1  10. HSV-1 (herpes simplex virus 1) infection controlled  11. Globus sensation Very concerning with smoking history and right cervical adenopathy.  Will refer to Dr. Lucia Gaskins and get CT neck to rule out CA No GERD symptoms, no dysphagia- if test negative will try PPI No allergy symptoms/PND - Ambulatory referral to ENT - CT Soft Tissue Neck W Contrast; Future  12. Need for prophylactic vaccination against Streptococcus pneumoniae (pneumococcus) Out of in the office, needs next OV  13. Encounter for general adult medical examination with abnormal findings OVER 40 minutes of exam, counseling, chart review, referral performed   Plan:   During the course of  the visit the patient was educated and counseled about appropriate screening and preventive services including:    Pneumococcal vaccine   Influenza vaccine  Td vaccine  Screening electrocardiogram  Screening mammography  Bone densitometry screening  Colorectal cancer screening  Diabetes screening  Glaucoma screening  Nutrition counseling   Advanced directives: given info/requested  Screening recommendations, referrals:  Vaccinations: Please see documentation below and orders this visit.   Nutrition assessed and recommended  Colonoscopy due 2017 Mammogram due 2016 Pap smear not indicated Pelvic exam not indicated Recommended yearly ophthalmology/optometry visit for glaucoma screening and checkup Recommended yearly dental visit for hygiene and checkup Advanced directives - requested  Conditions/risks identified: BMI: Discussed weight loss, diet, and increase physical activity.  Increase physical activity: AHA recommends 150 minutes of physical activity a week.  Medications reviewed DEXA- requested Diabetes is at goal, ACE/ARB therapy: No, Reason not on Ace Inhibitor/ARB therapy:  preDM Urinary Incontinence is not an issue: discussed non pharmacology and pharmacology options.  Fall risk: low- discussed PT, home fall assessment, medications.    Subjective:   Catherine Parks is a 74 y.o. female who presents for Medicare Annual Wellness Visit and 3 month follow up on hypertension, prediabetes, hyperlipidemia, vitamin D def.  Date of last medicare wellness visit is 03/03/2014  Her blood pressure has been controlled at home, today their BP is BP: 120/70 mmHg She does not workout, she has joined the [facility] but has not started going. She denies chest pain, shortness of breath, dizziness.  She is not on cholesterol medication and denies myalgias. Her cholesterol is at goal. The cholesterol last visit was:   Lab Results  Component Value Date   CHOL 189 06/08/2014  HDL  86 06/08/2014   LDLCALC 89 06/08/2014   TRIG 68 06/08/2014   CHOLHDL 2.2 06/08/2014  She has been working on diet and exercise for prediabetes, and denies paresthesia of the feet, polydipsia, polyuria and visual disturbances. Last A1C in the office was:  Lab Results  Component Value Date   HGBA1C 5.7* 06/08/2014  Patient is on Vitamin D supplement. Lab Results  Component Value Date   VD25OH 19 06/08/2014  She complains of "lump" in her throat for the last month. Constant globulus sensation, has had night sweats since she was in her 30's. She denies pain with it, denies food/liquids/pills getting stuck, denies heart burn, sinus drainage, fever, chills, weight loss. Not getting worse, not getting better. She buys a pack every two days, and has smoked for 50 years. She would like to quit smoking.   Wt Readings from Last 3 Encounters:  09/27/14 149 lb (67.586 kg)  06/08/14 146 lb (66.225 kg)  03/03/14 147 lb 6.4 oz (66.86 kg)      Names of Other Physician/Practitioners you currently use: 1. [facility]- here for primary care 2.Dr. Freda Munro, dentist, last visit q 3 months Patient Care Team: Unk Pinto, MD as PCP - General (Internal Medicine) Josephina Gip, MD as Consulting Physician (Optometry)- saw last summer, over due Sanda Klein, MD as Consulting Physician (Cardiology) Sable Feil, MD as Consulting Physician (Gastroenterology) Mickle Plumb, MD (Gynecology) Simona Huh, MD as Consulting Physician (Dermatology) Volanda Napoleon, MD as Consulting Physician (Oncology)  Medication Review Current Outpatient Prescriptions on File Prior to Visit  Medication Sig Dispense Refill  . amLODipine (NORVASC) 10 MG tablet Take 1 tablet daily for BP 90 tablet 1  . aspirin EC 81 MG tablet Take 81 mg by mouth 2 (two) times a week.      . Cholecalciferol (VITAMIN D) 2000 UNITS tablet Take 2,000 Units by mouth daily.     .  cloNIDine (CATAPRES) 0.2 MG tablet TAKE 1 TABLET (0.2 MG TOTAL) BY MOUTH 2 (TWO) TIMES DAILY. 180 tablet 99  . Flaxseed, Linseed, (FLAX SEED OIL) 1000 MG CAPS Take by mouth daily.    . Magnesium 250 MG TABS Take by mouth daily.    Marland Kitchen triamterene-hydrochlorothiazide (MAXZIDE-25) 37.5-25 MG per tablet Take 1 tablet daily for BP & fluid 90 tablet 1  . triamterene-hydrochlorothiazide (MAXZIDE-25) 37.5-25 MG per tablet TAKE 1 TABLET BY MOUTH DAILY. 90 tablet 1   No current facility-administered medications on file prior to visit.    Current Problems (verified) Patient Active Problem List   Diagnosis Date Noted  . CKD Stage II (GFR 65 ml/min) 06/08/2014  . Encounter for long-term (current) use of other medications 06/08/2014  . Hyperlipidemia 08/19/2013  . PreDiabetes 08/19/2013  . Vitamin D Deficiency 08/19/2013  . Hypertension   . HSV-1 (herpes simplex virus 1) infection   . Diverticulosis   . Colon polyps     Screening Tests Health Maintenance  Topic Date Due  . ZOSTAVAX  10/26/2000  . INFLUENZA VACCINE  04/16/2014  . MAMMOGRAM  01/15/2016  . COLONOSCOPY  09/16/2020  . TETANUS/TDAP  05/07/2022  . DEXA SCAN  Completed  . PNEUMOCOCCAL POLYSACCHARIDE VACCINE AGE 70 AND OVER  Completed     Immunization History  Administered Date(s) Administered  . DTaP 07/30/2011  . Pneumococcal Polysaccharide-23 05/11/2013  . Pneumococcal-Unspecified 01/07/2002  . Td 01/07/2002, 05/07/2012    Preventative care: Last colonoscopy: 2012 due 2017 EGD: 2012 Last mammogram:  01/2014- had normal [place] Last pap smear/pelvic exam: 2013 DEXA: 2013 normal Due Echo 2009 EF 60%  Prior vaccinations: TD or Tdap: 2013 Influenza: declines Pneumococcal: 2014 Prevnar DUE Shingles/Zostavax: declines  History reviewed: allergies, current medications, past family history, past medical history, past social history, past surgical history and problem list    Medication List       This list is  accurate as of: 09/27/14 11:01 AM.  Always use your most recent med list.               amLODipine 10 MG tablet  Commonly known as:  NORVASC  Take 1 tablet daily for BP     aspirin EC 81 MG tablet  Take 81 mg by mouth 2 (two) times a week.     cloNIDine 0.2 MG tablet  Commonly known as:  CATAPRES  TAKE 1 TABLET (0.2 MG TOTAL) BY MOUTH 2 (TWO) TIMES DAILY.     Flax Seed Oil 1000 MG Caps  Take by mouth daily.     Magnesium 250 MG Tabs  Take by mouth daily.     triamterene-hydrochlorothiazide 37.5-25 MG per tablet  Commonly known as:  MAXZIDE-25  Take 1 tablet daily for BP & fluid     triamterene-hydrochlorothiazide 37.5-25 MG per tablet  Commonly known as:  MAXZIDE-25  TAKE 1 TABLET BY MOUTH DAILY.     Vitamin D 2000 UNITS tablet  Take 2,000 Units by mouth daily.       Past Surgical History  Procedure Laterality Date  . Abdominal hysterectomy    . Eye surgery      cataracts   Family History  Problem Relation Age of Onset  . Hypertension Mother   . Stroke Mother   . Heart attack Father     Risk Factors: Osteoporosis/FallRisk: postmenopausal estrogen deficiency and dietary calcium and/or vitamin D deficiency In the past year have you fallen or had a near fall?:No History of fracture in the past year: no  Tobacco History  Substance Use Topics  . Smoking status: Current Every Day Smoker -- 0.50 packs/day    Types: Cigarettes  . Smokeless tobacco: Not on file  . Alcohol Use: No   She does smoke.   Are there smokers in your home (other than you)?  Yes  Alcohol Current alcohol use: none  Caffeine Current caffeine use: coffee 1 /day  Exercise Current exercise: no regular exercise  Nutrition/Diet Current diet: in general, a "healthy" diet    Cardiac risk factors: advanced age (older than 39 for men, 92 for women), hypertension and sedentary lifestyle.  Depression Screen (Note: if answer to either of the following is "Yes", a more complete  depression screening is indicated)   Q1: Over the past two weeks, have you felt down, depressed or hopeless? No  Q2: Over the past two weeks, have you felt little interest or pleasure in doing things? No  Have you lost interest or pleasure in daily life? No  Do you often feel hopeless? No  Do you cry easily over simple problems? No  Activities of Daily Living In your present state of health, do you have any difficulty performing the following activities?:  Driving? No Managing money?  No Feeding yourself? No Getting from bed to chair? No Climbing a flight of stairs? No Preparing food and eating?: No Bathing or showering? No Getting dressed: No Getting to the toilet? No Using the toilet:No Moving around from place to place: No   Are you sexually  active?  No  Do you have more than one partner?  No  Vision Difficulties: No  Hearing Difficulties: No Do you often ask people to speak up or repeat themselves? No Do you experience ringing or noises in your ears? No Do you have difficulty understanding soft or whispered voices? No  Cognition  Do you feel that you have a problem with memory?No  Do you often misplace items? No  Do you feel safe at home?  Yes  Advanced directives Does patient have a [place]? No Does patient have a Living Will? No   Objective:   Blood pressure 120/70, pulse 72, temperature 97.9 F (36.6 C), resp. rate 16, height 5' 7.25" (1.708 m), weight 149 lb (67.586 kg). Body mass index is 23.17 kg/(m^2).  General appearance: alert, no distress, WD/WN,  female Cognitive Testing  Alert? Yes  Normal Appearance?Yes  Oriented to person? Yes  Place? Yes   Time? Yes  Recall of three objects?  Yes  Can perform simple calculations? Yes  Displays appropriate judgment?Yes  Can read the correct time from a watch face?Yes  HEENT: normocephalic, sclerae anicteric, TMs pearly, nares patent, no discharge or erythema, pharynx normal Oral  cavity: MMM, no lesions Neck: supple, + right anterior cervical lymphadenopathy nontender, no thyromegaly Heart: RRR, normal S1, S2, no murmurs Lungs: CTA bilaterally, no wheezes, rhonchi, or rales Abdomen: +bs, soft, non tender, non distended, no masses, no hepatomegaly, no splenomegaly Musculoskeletal: nontender, no swelling, no obvious deformity Extremities: no edema, no cyanosis, no clubbing Pulses: 2+ symmetric, upper and lower extremities, normal cap refill Neurological: alert, oriented x 3, CN2-12 intact, strength normal upper extremities and lower extremities, sensation normal throughout, DTRs 2+ throughout, no cerebellar signs, gait normal Psychiatric: normal affect, behavior normal, pleasant  Breast: defer Gyn: defer Rectal: defer  Medicare Attestation I have personally reviewed: The patient's medical and social history Their use of alcohol, tobacco or illicit drugs Their current medications and supplements The patient's functional ability including ADLs,fall risks, home safety risks, cognitive, and hearing and visual impairment Diet and physical activities Evidence for depression or mood disorders  The patient's weight, height, BMI, and visual acuity have been recorded in the chart.  I have made referrals, counseling, and provided education to the patient based on review of the above and I have provided the patient with a written personalized care plan for preventive services.     Vicie Mutters, PA-C   09/27/2014

## 2014-09-28 LAB — VITAMIN D 25 HYDROXY (VIT D DEFICIENCY, FRACTURES): Vit D, 25-Hydroxy: 46 ng/mL (ref 30–100)

## 2014-09-30 ENCOUNTER — Ambulatory Visit
Admission: RE | Admit: 2014-09-30 | Discharge: 2014-09-30 | Disposition: A | Discharge status: Home / Self Care | Payer: Medicare Other | Source: Ambulatory Visit | Attending: Physician Assistant | Admitting: Physician Assistant

## 2014-09-30 DIAGNOSIS — R0989 Other specified symptoms and signs involving the circulatory and respiratory systems: Secondary | ICD-10-CM

## 2014-09-30 MED ORDER — IOHEXOL 300 MG/ML  SOLN
75.0000 mL | Freq: Once | INTRAMUSCULAR | Status: AC | PRN
  Administered 2014-09-30: 75 mL via INTRAVENOUS

## 2014-10-04 ENCOUNTER — Other Ambulatory Visit: Payer: Self-pay

## 2014-10-04 DIAGNOSIS — C159 Malignant neoplasm of esophagus, unspecified: Secondary | ICD-10-CM

## 2014-10-10 ENCOUNTER — Telehealth: Payer: Self-pay | Admitting: *Deleted

## 2014-10-10 NOTE — Telephone Encounter (Signed)
Patient called requesting info about biopsy status, if scheduled or not.  Per Vicie Mutters, PA-C, she states she personally called Dr. Pollie Friar office to speak directly about patient and need for biopsy.  I was advised by Estill Bamberg to call patient and inform her to call Dr. Pollie Friar office and get appointment scheduled.  Dr. Lucia Gaskins is willing to work patient into his schedule for biopsy.

## 2014-10-19 ENCOUNTER — Encounter (HOSPITAL_BASED_OUTPATIENT_CLINIC_OR_DEPARTMENT_OTHER): Payer: Self-pay | Admitting: *Deleted

## 2014-10-19 NOTE — Progress Notes (Signed)
Labs done 09/27/14-ekg 9/15

## 2014-10-20 ENCOUNTER — Other Ambulatory Visit: Payer: Self-pay | Admitting: Otolaryngology

## 2014-10-20 NOTE — H&P (Signed)
PREOPERATIVE H&P  Chief Complaint: nasopharyngeal mass and lymphadeopathy  HPI: Catherine Parks is a 74 y.o. female who presents for evaluation of a nasopharyngeal mass and abnormal neck lymphadenopathy noted on recent CT scan of the neck. CT scan demonstrated a 1.5cm mass in the right side of the nasopharynx and bilateral neck lymphadenopathy right worse than left. She's taken to the OR for excisional biopsy of the nasopharyngeal mass and biopsy of right neck node.   Past Medical History  Diagnosis Date  . Hypertension   . HSV-1 (herpes simplex virus 1) infection   . Diverticulosis   . Colon polyps    Past Surgical History  Procedure Laterality Date  . Eye surgery      cataracts  . Abdominal hysterectomy    . Colonoscopy     History   Social History  . Marital Status: Widowed    Spouse Name: N/A    Number of Children: N/A  . Years of Education: N/A   Social History Main Topics  . Smoking status: Current Every Day Smoker -- 0.50 packs/day    Types: Cigarettes  . Smokeless tobacco: None  . Alcohol Use: No  . Drug Use: No  . Sexual Activity: None     Comment: has not smoked since 09/27/14-using nicotin gum   Other Topics Concern  . None   Social History Narrative   Family History  Problem Relation Age of Onset  . Hypertension Mother   . Stroke Mother   . Heart attack Father    Allergies  Allergen Reactions  . Lemon Oil   . Shellfish Allergy Swelling   Prior to Admission medications   Medication Sig Start Date End Date Taking? Authorizing Provider  amLODipine (NORVASC) 10 MG tablet Take 1 tablet daily for BP 06/08/14   Unk Pinto, MD  aspirin EC 81 MG tablet Take 81 mg by mouth 2 (two) times a week.      Historical Provider, MD  Cholecalciferol (VITAMIN D) 2000 UNITS tablet Take 2,000 Units by mouth daily.     Historical Provider, MD  cloNIDine (CATAPRES) 0.2 MG tablet TAKE 1 TABLET (0.2 MG TOTAL) BY MOUTH 2 (TWO) TIMES DAILY. 05/09/14   Unk Pinto, MD   Flaxseed, Linseed, (FLAX SEED OIL) 1000 MG CAPS Take by mouth daily.    Historical Provider, MD  triamterene-hydrochlorothiazide (MAXZIDE-25) 37.5-25 MG per tablet TAKE 1 TABLET BY MOUTH DAILY. 06/18/14   Unk Pinto, MD     Positive ROS: globus symptoms  All other systems have been reviewed and were otherwise negative with the exception of those mentioned in the HPI and as above.  Physical Exam: There were no vitals filed for this visit.  General: Alert, no acute distress Oral: Normal oral mucosa and tonsils Nasal: Clear nasal passages anterior ly. Nasal endoscopy reveals a 1-2 cm pedunculated polypoid mass in the right nasopharynx. Neck: Scattered 1-1.5 cm adenopathy or no thyroid nodules Ear: Ear canal is clear with normal appearing TMs Cardiovascular: Regular rate and rhythm, no murmur.  Respiratory: Clear to auscultation Neurologic: Alert and oriented x 3   Assessment/Plan: nasal mass Plan for Procedure(s): DIRECT LARYNGOSCOPY RIGHT NECK NODE BIOPSY EXCISION NASAL MASS   Melony Overly, MD 10/20/2014 4:46 PM

## 2014-10-21 ENCOUNTER — Ambulatory Visit (HOSPITAL_BASED_OUTPATIENT_CLINIC_OR_DEPARTMENT_OTHER)
Admission: RE | Admit: 2014-10-21 | Discharge: 2014-10-21 | Disposition: A | Discharge status: Home / Self Care | Payer: Medicare Other | Source: Ambulatory Visit | Attending: Otolaryngology | Admitting: Otolaryngology

## 2014-10-21 ENCOUNTER — Ambulatory Visit (HOSPITAL_BASED_OUTPATIENT_CLINIC_OR_DEPARTMENT_OTHER): Payer: Medicare Other | Admitting: Anesthesiology

## 2014-10-21 ENCOUNTER — Telehealth: Payer: Self-pay | Admitting: Hematology & Oncology

## 2014-10-21 ENCOUNTER — Encounter (HOSPITAL_BASED_OUTPATIENT_CLINIC_OR_DEPARTMENT_OTHER)
Admission: RE | Disposition: A | Discharge status: Home / Self Care | Payer: Self-pay | Source: Ambulatory Visit | Attending: Otolaryngology

## 2014-10-21 ENCOUNTER — Encounter (HOSPITAL_BASED_OUTPATIENT_CLINIC_OR_DEPARTMENT_OTHER): Payer: Self-pay | Admitting: *Deleted

## 2014-10-21 DIAGNOSIS — I10 Essential (primary) hypertension: Secondary | ICD-10-CM | POA: Insufficient documentation

## 2014-10-21 DIAGNOSIS — B009 Herpesviral infection, unspecified: Secondary | ICD-10-CM | POA: Diagnosis not present

## 2014-10-21 DIAGNOSIS — F17210 Nicotine dependence, cigarettes, uncomplicated: Secondary | ICD-10-CM | POA: Diagnosis not present

## 2014-10-21 DIAGNOSIS — Z79899 Other long term (current) drug therapy: Secondary | ICD-10-CM | POA: Diagnosis not present

## 2014-10-21 DIAGNOSIS — J349 Unspecified disorder of nose and nasal sinuses: Secondary | ICD-10-CM | POA: Diagnosis present

## 2014-10-21 DIAGNOSIS — J330 Polyp of nasal cavity: Secondary | ICD-10-CM | POA: Diagnosis not present

## 2014-10-21 DIAGNOSIS — Z7982 Long term (current) use of aspirin: Secondary | ICD-10-CM | POA: Insufficient documentation

## 2014-10-21 HISTORY — PX: EXCISION NASAL MASS: SHX6271

## 2014-10-21 HISTORY — PX: MASS EXCISION: SHX2000

## 2014-10-21 HISTORY — PX: DIRECT LARYNGOSCOPY: SHX5326

## 2014-10-21 LAB — POCT HEMOGLOBIN-HEMACUE: HEMOGLOBIN: 11.1 g/dL — AB (ref 12.0–15.0)

## 2014-10-21 SURGERY — LARYNGOSCOPY, DIRECT
Anesthesia: General | Laterality: Right

## 2014-10-21 MED ORDER — MIDAZOLAM HCL 5 MG/5ML IJ SOLN
INTRAMUSCULAR | Status: DC | PRN
  Administered 2014-10-21: 2 mg via INTRAVENOUS

## 2014-10-21 MED ORDER — FENTANYL CITRATE 0.05 MG/ML IJ SOLN
INTRAMUSCULAR | Status: AC
  Filled 2014-10-21: qty 6

## 2014-10-21 MED ORDER — BACITRACIN ZINC 500 UNIT/GM EX OINT
TOPICAL_OINTMENT | CUTANEOUS | Status: AC
  Filled 2014-10-21: qty 28.35

## 2014-10-21 MED ORDER — HYDROCODONE-ACETAMINOPHEN 5-325 MG PO TABS: 1.0000 | ORAL_TABLET | Freq: Four times a day (QID) | ORAL | Status: DC | PRN

## 2014-10-21 MED ORDER — BACITRACIN 500 UNIT/GM EX OINT
TOPICAL_OINTMENT | CUTANEOUS | Status: DC | PRN
  Administered 2014-10-21: 1 via TOPICAL

## 2014-10-21 MED ORDER — OXYCODONE HCL 5 MG/5ML PO SOLN: 5.0000 mg | Freq: Once | ORAL | Status: DC | PRN

## 2014-10-21 MED ORDER — SUCCINYLCHOLINE CHLORIDE 20 MG/ML IJ SOLN
INTRAMUSCULAR | Status: DC | PRN
  Administered 2014-10-21: 120 mg via INTRAVENOUS

## 2014-10-21 MED ORDER — EPINEPHRINE HCL 1 MG/ML IJ SOLN
INTRAMUSCULAR | Status: DC | PRN
  Administered 2014-10-21: 1 mg

## 2014-10-21 MED ORDER — OXYCODONE HCL 5 MG PO TABS: 5.0000 mg | ORAL_TABLET | Freq: Once | ORAL | Status: DC | PRN

## 2014-10-21 MED ORDER — PROPOFOL 10 MG/ML IV BOLUS
INTRAVENOUS | Status: DC | PRN
  Administered 2014-10-21: 200 mg via INTRAVENOUS

## 2014-10-21 MED ORDER — LACTATED RINGERS IV SOLN
INTRAVENOUS | Status: DC
  Administered 2014-10-21 (×2): via INTRAVENOUS

## 2014-10-21 MED ORDER — ONDANSETRON HCL 4 MG/2ML IJ SOLN
INTRAMUSCULAR | Status: DC | PRN
  Administered 2014-10-21: 4 mg via INTRAVENOUS

## 2014-10-21 MED ORDER — MIDAZOLAM HCL 2 MG/2ML IJ SOLN
INTRAMUSCULAR | Status: AC
  Filled 2014-10-21: qty 2

## 2014-10-21 MED ORDER — OXYMETAZOLINE HCL 0.05 % NA SOLN
NASAL | Status: DC | PRN
  Administered 2014-10-21: 1 via NASAL

## 2014-10-21 MED ORDER — CEFAZOLIN SODIUM-DEXTROSE 2-3 GM-% IV SOLR
2.0000 g | INTRAVENOUS | Status: AC
  Administered 2014-10-21: 2 g via INTRAVENOUS

## 2014-10-21 MED ORDER — ONDANSETRON HCL 4 MG/2ML IJ SOLN: 4.0000 mg | Freq: Once | INTRAMUSCULAR | Status: DC | PRN

## 2014-10-21 MED ORDER — LIDOCAINE-EPINEPHRINE 1 %-1:100000 IJ SOLN
INTRAMUSCULAR | Status: DC | PRN
  Administered 2014-10-21: 2 mL

## 2014-10-21 MED ORDER — FENTANYL CITRATE 0.05 MG/ML IJ SOLN
INTRAMUSCULAR | Status: DC | PRN
  Administered 2014-10-21 (×3): 50 ug via INTRAVENOUS
  Administered 2014-10-21: 100 ug via INTRAVENOUS

## 2014-10-21 MED ORDER — EPINEPHRINE HCL 1 MG/ML IJ SOLN
INTRAMUSCULAR | Status: AC
  Filled 2014-10-21: qty 1

## 2014-10-21 MED ORDER — PHENYLEPHRINE HCL 10 MG/ML IJ SOLN
INTRAMUSCULAR | Status: DC | PRN
  Administered 2014-10-21 (×2): 40 ug via INTRAVENOUS

## 2014-10-21 MED ORDER — GLYCOPYRROLATE 0.2 MG/ML IJ SOLN
INTRAMUSCULAR | Status: DC | PRN
  Administered 2014-10-21: 0.2 mg via INTRAVENOUS

## 2014-10-21 MED ORDER — OXYMETAZOLINE HCL 0.05 % NA SOLN
NASAL | Status: AC
  Filled 2014-10-21: qty 15

## 2014-10-21 MED ORDER — EPHEDRINE SULFATE 50 MG/ML IJ SOLN
INTRAMUSCULAR | Status: DC | PRN
  Administered 2014-10-21: 10 mg via INTRAVENOUS

## 2014-10-21 MED ORDER — PHENYLEPHRINE HCL 10 MG/ML IJ SOLN
10.0000 mg | INTRAVENOUS | Status: DC | PRN
  Administered 2014-10-21: 40 ug/min via INTRAVENOUS

## 2014-10-21 MED ORDER — LIDOCAINE-EPINEPHRINE 1 %-1:100000 IJ SOLN
INTRAMUSCULAR | Status: AC
  Filled 2014-10-21: qty 1

## 2014-10-21 MED ORDER — DEXAMETHASONE SODIUM PHOSPHATE 4 MG/ML IJ SOLN
INTRAMUSCULAR | Status: DC | PRN
  Administered 2014-10-21: 10 mg via INTRAVENOUS

## 2014-10-21 MED ORDER — FENTANYL CITRATE 0.05 MG/ML IJ SOLN: 50.0000 ug | INTRAMUSCULAR | Status: DC | PRN

## 2014-10-21 MED ORDER — LIDOCAINE HCL (CARDIAC) 20 MG/ML IV SOLN
INTRAVENOUS | Status: DC | PRN
  Administered 2014-10-21: 50 mg via INTRAVENOUS

## 2014-10-21 MED ORDER — MIDAZOLAM HCL 2 MG/2ML IJ SOLN: 1.0000 mg | INTRAMUSCULAR | Status: DC | PRN

## 2014-10-21 MED ORDER — HYDROMORPHONE HCL 1 MG/ML IJ SOLN: 0.2500 mg | INTRAMUSCULAR | Status: DC | PRN

## 2014-10-21 SURGICAL SUPPLY — 96 items
APL SKNCLS STERI-STRIP NONHPOA (GAUZE/BANDAGES/DRESSINGS)
APPLICATOR COTTON TIP 6IN STRL (MISCELLANEOUS) ×4 IMPLANT
BANDAGE GAUZE 4  KLING STR (GAUZE/BANDAGES/DRESSINGS) IMPLANT
BENZOIN TINCTURE PRP APPL 2/3 (GAUZE/BANDAGES/DRESSINGS) IMPLANT
BLADE CLIPPER SURG (BLADE) IMPLANT
BLADE SURG 15 STRL LF DISP TIS (BLADE) ×2 IMPLANT
BLADE SURG 15 STRL SS (BLADE) ×4
BNDG CONFORM 3 STRL LF (GAUZE/BANDAGES/DRESSINGS) IMPLANT
CANISTER SUCT 1200ML W/VALVE (MISCELLANEOUS) ×4 IMPLANT
CATH ROBINSON RED A/P 14FR (CATHETERS) ×3 IMPLANT
CLEANER CAUTERY TIP 5X5 PAD (MISCELLANEOUS) IMPLANT
CLOSURE WOUND 1/2 X4 (GAUZE/BANDAGES/DRESSINGS)
CLOSURE WOUND 1/4X4 (GAUZE/BANDAGES/DRESSINGS)
COAGULATOR SUCT 8FR VV (MISCELLANEOUS) ×2 IMPLANT
CONT SPEC 4OZ CLIKSEAL STRL BL (MISCELLANEOUS) IMPLANT
CORDS BIPOLAR (ELECTRODE) IMPLANT
COVER BACK TABLE 60X90IN (DRAPES) ×4 IMPLANT
COVER MAYO STAND STRL (DRAPES) ×6 IMPLANT
DECANTER SPIKE VIAL GLASS SM (MISCELLANEOUS) IMPLANT
DEPRESSOR TONGUE BLADE STERILE (MISCELLANEOUS) IMPLANT
DRAPE U-SHAPE 76X120 STRL (DRAPES) ×4 IMPLANT
DRESSING NASAL POPE 10X1.5X2.5 (GAUZE/BANDAGES/DRESSINGS) IMPLANT
DRSG NASAL POPE 10X1.5X2.5 (GAUZE/BANDAGES/DRESSINGS)
DRSG TELFA 3X8 NADH (GAUZE/BANDAGES/DRESSINGS) IMPLANT
ELECT COATED BLADE 2.86 ST (ELECTRODE) ×4 IMPLANT
ELECT REM PT RETURN 9FT ADLT (ELECTROSURGICAL) ×4
ELECT REM PT RETURN 9FT PED (ELECTROSURGICAL)
ELECTRODE REM PT RETRN 9FT PED (ELECTROSURGICAL) IMPLANT
ELECTRODE REM PT RTRN 9FT ADLT (ELECTROSURGICAL) ×2 IMPLANT
GAUZE SPONGE 4X4 16PLY XRAY LF (GAUZE/BANDAGES/DRESSINGS) IMPLANT
GLOVE BIO SURGEON STRL SZ 6.5 (GLOVE) ×2 IMPLANT
GLOVE BIO SURGEONS STRL SZ 6.5 (GLOVE) ×2
GLOVE BIOGEL PI IND STRL 7.0 (GLOVE) ×2 IMPLANT
GLOVE BIOGEL PI INDICATOR 7.0 (GLOVE) ×4
GLOVE SS BIOGEL STRL SZ 7.5 (GLOVE) ×2 IMPLANT
GLOVE SUPERSENSE BIOGEL SZ 7.5 (GLOVE) ×4
GOWN STRL REUS W/ TWL LRG LVL3 (GOWN DISPOSABLE) ×2 IMPLANT
GOWN STRL REUS W/ TWL XL LVL3 (GOWN DISPOSABLE) IMPLANT
GOWN STRL REUS W/TWL LRG LVL3 (GOWN DISPOSABLE) ×8
GOWN STRL REUS W/TWL XL LVL3 (GOWN DISPOSABLE) ×4
GUARD TEETH (MISCELLANEOUS) ×4 IMPLANT
HEMOSTAT SURGICEL .5X2 ABSORB (HEMOSTASIS) IMPLANT
HEMOSTAT SURGICEL 2X14 (HEMOSTASIS) IMPLANT
LIQUID BAND (GAUZE/BANDAGES/DRESSINGS) IMPLANT
MARKER SKIN DUAL TIP RULER LAB (MISCELLANEOUS) IMPLANT
NDL HYPO 25X1 1.5 SAFETY (NEEDLE) IMPLANT
NDL PRECISIONGLIDE 27X1.5 (NEEDLE) IMPLANT
NDL SAFETY ECLIPSE 18X1.5 (NEEDLE) ×2 IMPLANT
NDL SPNL 22GX7 QUINCKE BK (NEEDLE) IMPLANT
NEEDLE HYPO 18GX1.5 SHARP (NEEDLE) ×4
NEEDLE HYPO 25X1 1.5 SAFETY (NEEDLE) ×4 IMPLANT
NEEDLE PRECISIONGLIDE 27X1.5 (NEEDLE) IMPLANT
NEEDLE SPNL 22GX7 QUINCKE BK (NEEDLE) IMPLANT
NS IRRIG 1000ML POUR BTL (IV SOLUTION) ×4 IMPLANT
PACK BASIN DAY SURGERY FS (CUSTOM PROCEDURE TRAY) ×4 IMPLANT
PAD CLEANER CAUTERY TIP 5X5 (MISCELLANEOUS)
PAD DRESSING TELFA 3X8 NADH (GAUZE/BANDAGES/DRESSINGS) IMPLANT
PATTIES SURGICAL .5 X3 (DISPOSABLE) ×4 IMPLANT
PENCIL BUTTON HOLSTER BLD 10FT (ELECTRODE) ×4 IMPLANT
SHEET MEDIUM DRAPE 40X70 STRL (DRAPES) ×4 IMPLANT
SLEEVE SCD COMPRESS KNEE MED (MISCELLANEOUS) ×4 IMPLANT
SOLUTION ANTI FOG 6CC (MISCELLANEOUS) IMPLANT
SOLUTION BUTLER CLEAR DIP (MISCELLANEOUS) ×4 IMPLANT
SPONGE GAUZE 2X2 8PLY STER LF (GAUZE/BANDAGES/DRESSINGS)
SPONGE GAUZE 2X2 8PLY STRL LF (GAUZE/BANDAGES/DRESSINGS) IMPLANT
SPONGE GAUZE 4X4 12PLY STER LF (GAUZE/BANDAGES/DRESSINGS) ×8 IMPLANT
SPONGE INTESTINAL PEANUT (DISPOSABLE) IMPLANT
STAPLER VISISTAT 35W (STAPLE) IMPLANT
STRIP CLOSURE SKIN 1/2X4 (GAUZE/BANDAGES/DRESSINGS) IMPLANT
STRIP CLOSURE SKIN 1/4X4 (GAUZE/BANDAGES/DRESSINGS) IMPLANT
SUCTION FRAZIER TIP 10 FR DISP (SUCTIONS) IMPLANT
SURGILUBE 2OZ TUBE FLIPTOP (MISCELLANEOUS) IMPLANT
SUT CHROMIC 3 0 PS 2 (SUTURE) ×4 IMPLANT
SUT CHROMIC 3 0 SH 27 (SUTURE) IMPLANT
SUT CHROMIC 3 0 TIES (SUTURE) IMPLANT
SUT ETHILON 4 0 PS 2 18 (SUTURE) IMPLANT
SUT ETHILON 5 0 P 3 18 (SUTURE) ×2
SUT NYLON ETHILON 5-0 P-3 1X18 (SUTURE) ×2 IMPLANT
SUT SILK 2 0 TIES 17X18 (SUTURE)
SUT SILK 2-0 18XBRD TIE BLK (SUTURE) IMPLANT
SUT SILK 3 0 SH 30 (SUTURE) IMPLANT
SUT SILK 3 0 TIES 17X18 (SUTURE)
SUT SILK 3-0 18XBRD TIE BLK (SUTURE) IMPLANT
SUT VIC AB 5-0 P-3 18X BRD (SUTURE) IMPLANT
SUT VIC AB 5-0 P3 18 (SUTURE)
SWAB COLLECTION DEVICE MRSA (MISCELLANEOUS) IMPLANT
SYR 5ML LL (SYRINGE) ×4 IMPLANT
SYR BULB 3OZ (MISCELLANEOUS) ×4 IMPLANT
SYR CONTROL 10ML LL (SYRINGE) ×2 IMPLANT
TOWEL OR 17X24 6PK STRL BLUE (TOWEL DISPOSABLE) ×8 IMPLANT
TOWEL OR NON WOVEN STRL DISP B (DISPOSABLE) ×4 IMPLANT
TRAY DSU PREP LF (CUSTOM PROCEDURE TRAY) ×4 IMPLANT
TUBE ANAEROBIC SPECIMEN COL (MISCELLANEOUS) IMPLANT
TUBE CONNECTING 20'X1/4 (TUBING) ×2
TUBE CONNECTING 20X1/4 (TUBING) ×5 IMPLANT
YANKAUER SUCT BULB TIP NO VENT (SUCTIONS) ×4 IMPLANT

## 2014-10-21 NOTE — Interval H&P Note (Signed)
History and Physical Interval Note:  10/21/2014 9:24 AM  [place]  has presented today for surgery, with the diagnosis of nasal mass  The various methods of treatment have been discussed with the patient and family. After consideration of risks, benefits and other options for treatment, the patient has consented to  Procedure(s): DIRECT LARYNGOSCOPY (N/A) RIGHT NECK NODE BIOPSY (Right) EXCISION NASAL MASS (Right) as a surgical intervention .  The patient's history has been reviewed, patient examined, no change in status, stable for surgery.  I have reviewed the patient's chart and labs.  Questions were answered to the patient's satisfaction.     Jaelene Garciagarcia

## 2014-10-21 NOTE — Anesthesia Procedure Notes (Signed)
Procedure Name: Intubation Date/Time: 10/21/2014 9:45 AM Performed by: Melynda Ripple D Pre-anesthesia Checklist: Patient identified, Emergency Drugs available, Suction available and Patient being monitored Patient Re-evaluated:Patient Re-evaluated prior to inductionOxygen Delivery Method: Circle System Utilized Preoxygenation: Pre-oxygenation with 100% oxygen Intubation Type: IV induction Ventilation: Mask ventilation without difficulty Laryngoscope Size: Mac and 3 Grade View: Grade II Tube type: Oral Tube size: 6.5 mm Number of attempts: 1 Airway Equipment and Method: Stylet and Oral airway Placement Confirmation: ETT inserted through vocal cords under direct vision,  positive ETCO2 and breath sounds checked- equal and bilateral Secured at: 22 cm Tube secured with: Tape Dental Injury: Teeth and Oropharynx as per pre-operative assessment

## 2014-10-21 NOTE — Progress Notes (Signed)
Right neck node biopsy-clean Laryngoscopy and excision nasopharyngeal mass-clean contaminated

## 2014-10-21 NOTE — Telephone Encounter (Signed)
Left vm w NEW PATIENT today to remind them of their appointment with Dr. Ennever. Also, advised them to bring all medication bottles and insurance card information. ° °

## 2014-10-21 NOTE — Transfer of Care (Signed)
Immediate Anesthesia Transfer of Care Note  Patient: Catherine Parks  Procedure(s) Performed: Procedure(s): DIRECT LARYNGOSCOPY (N/A) RIGHT NECK NODE BIOPSY (Right) EXCISION NASOPHARYNGEAL MASS (Right)  Patient Location: PACU  Anesthesia Type:General  Level of Consciousness: awake, alert  and oriented  Airway & Oxygen Therapy: Patient Spontanous Breathing and Patient connected to face mask oxygen  Post-op Assessment: Report given to RN and Post -op Vital signs reviewed and stable  Post vital signs: Reviewed and stable  Last Vitals:  Filed Vitals:   10/21/14 0721  BP: 121/69  Pulse: 57  Temp: 36.4 C  Resp: 20    Complications: No apparent anesthesia complications

## 2014-10-21 NOTE — Anesthesia Preprocedure Evaluation (Signed)
Anesthesia Evaluation    Reviewed: Allergy & Precautions, NPO status , Patient's Chart, lab work & pertinent test results  Airway Mallampati: I  TM Distance: >3 FB Neck ROM: Full    Dental  (+) Partial Upper, Dental Advisory Given   Pulmonary Current Smoker,  breath sounds clear to auscultation        Cardiovascular hypertension, Pt. on medications Rhythm:Regular Rate:Normal     Neuro/Psych    GI/Hepatic   Endo/Other    Renal/GU      Musculoskeletal   Abdominal   Peds  Hematology   Anesthesia Other Findings   Reproductive/Obstetrics                             Anesthesia Physical Anesthesia Plan  ASA: II  Anesthesia Plan: General   Post-op Pain Management:    Induction: Intravenous  Airway Management Planned: Oral ETT  Additional Equipment:   Intra-op Plan:   Post-operative Plan: Extubation in OR  Informed Consent: I have reviewed the patients History and Physical, chart, labs and discussed the procedure including the risks, benefits and alternatives for the proposed anesthesia with the patient or authorized representative who has indicated his/her understanding and acceptance.   Dental advisory given  Plan Discussed with: CRNA, Anesthesiologist and Surgeon  Anesthesia Plan Comments:         Anesthesia Quick Evaluation

## 2014-10-21 NOTE — Brief Op Note (Signed)
10/21/2014  11:46 AM  PATIENT:  Durwin Reges  74 y.o. female  PRE-OPERATIVE DIAGNOSIS:  nasal mass  POST-OPERATIVE DIAGNOSIS:  nasophayngeal mass  PROCEDURE:  Procedure(s): DIRECT LARYNGOSCOPY (N/A) RIGHT NECK NODE BIOPSY (Right) EXCISION NASOPHARYNGEAL MASS (Right)  SURGEON:  Surgeon(s) and Role:    * Rozetta Nunnery, MD - Primary  PHYSICIAN ASSISTANT:   ASSISTANTS: none   ANESTHESIA:   general  EBL:  Total I/O In: 1800 [I.V.:1800] Out: -   BLOOD ADMINISTERED:none  DRAINS: none   LOCAL MEDICATIONS USED:  LIDOCAINE with EPI  2 cc  SPECIMEN:  Source of Specimen:  nasopharyngeal mass and right neck node  DISPOSITION OF SPECIMEN:  PATHOLOGY  COUNTS:  YES  TOURNIQUET:  * No tourniquets in log *  DICTATION: .Other Dictation: Dictation Number [id number]  PLAN OF CARE: Discharge to home after PACU  PATIENT DISPOSITION:  PACU - hemodynamically stable.   Delay start of Pharmacological VTE agent (>24hrs) due to surgical blood loss or risk of bleeding: yes

## 2014-10-21 NOTE — Discharge Instructions (Addendum)
Take your regular meds Tylenol or motrin or hydrocodone tabs 1-2 every 6 hrs prn pain Call office for follow up appt in 6-7 days   Next Thurs or Friday    [phone]   Post Anesthesia Home Care Instructions  Activity: Get plenty of rest for the remainder of the day. A responsible adult should stay with you for 24 hours following the procedure.  For the next 24 hours, DO NOT: -Drive a car -Paediatric nurse -Drink alcoholic beverages -Take any medication unless instructed by your physician -Make any legal decisions or sign important papers.  Meals: Start with liquid foods such as gelatin or soup. Progress to regular foods as tolerated. Avoid greasy, spicy, heavy foods. If nausea and/or vomiting occur, drink only clear liquids until the nausea and/or vomiting subsides. Call your physician if vomiting continues.  Special Instructions/Symptoms: Your throat may feel dry or sore from the anesthesia or the breathing tube placed in your throat during surgery. If this causes discomfort, gargle with warm salt water. The discomfort should disappear within 24 hours.

## 2014-10-21 NOTE — Anesthesia Postprocedure Evaluation (Signed)
  Anesthesia Post-op Note  Patient: Catherine Parks  Procedure(s) Performed: Procedure(s): DIRECT LARYNGOSCOPY (N/A) RIGHT NECK NODE BIOPSY (Right) EXCISION NASOPHARYNGEAL MASS (Right)  Patient Location: PACU  Anesthesia Type: General   Level of Consciousness: awake, alert  and oriented  Airway and Oxygen Therapy: Patient Spontanous Breathing  Post-op Pain: none  Post-op Assessment: Post-op Vital signs reviewed  Post-op Vital Signs: Reviewed  Last Vitals:  Filed Vitals:   10/21/14 1254  BP: 134/66  Pulse: 54  Temp: 36.4 C  Resp: 18    Complications: No apparent anesthesia complications

## 2014-10-24 ENCOUNTER — Ambulatory Visit: Payer: Medicare Other

## 2014-10-24 ENCOUNTER — Telehealth: Payer: Self-pay | Admitting: Hematology & Oncology

## 2014-10-24 ENCOUNTER — Other Ambulatory Visit (HOSPITAL_BASED_OUTPATIENT_CLINIC_OR_DEPARTMENT_OTHER): Payer: Medicare Other | Admitting: Lab

## 2014-10-24 ENCOUNTER — Encounter: Payer: Self-pay | Admitting: Hematology & Oncology

## 2014-10-24 ENCOUNTER — Ambulatory Visit (HOSPITAL_BASED_OUTPATIENT_CLINIC_OR_DEPARTMENT_OTHER): Payer: Medicare Other | Admitting: Hematology & Oncology

## 2014-10-24 VITALS — BP 137/67 | HR 85 | Temp 97.4°F | Resp 16 | Ht 67.0 in | Wt 149.0 lb

## 2014-10-24 DIAGNOSIS — C119 Malignant neoplasm of nasopharynx, unspecified: Secondary | ICD-10-CM

## 2014-10-24 DIAGNOSIS — R590 Localized enlarged lymph nodes: Secondary | ICD-10-CM

## 2014-10-24 DIAGNOSIS — J349 Unspecified disorder of nose and nasal sinuses: Secondary | ICD-10-CM

## 2014-10-24 LAB — CMP (CANCER CENTER ONLY)
ALK PHOS: 35 U/L (ref 26–84)
ALT: 19 U/L (ref 10–47)
AST: 16 U/L (ref 11–38)
Albumin: 3.2 g/dL — ABNORMAL LOW (ref 3.3–5.5)
BUN: 10 mg/dL (ref 7–22)
CO2: 29 meq/L (ref 18–33)
CREATININE: 0.8 mg/dL (ref 0.6–1.2)
Calcium: 10.2 mg/dL (ref 8.0–10.3)
Chloride: 102 mEq/L (ref 98–108)
GLUCOSE: 100 mg/dL (ref 73–118)
Potassium: 3.5 mEq/L (ref 3.3–4.7)
Sodium: 142 mEq/L (ref 128–145)
Total Bilirubin: 0.4 mg/dl (ref 0.20–1.60)
Total Protein: 7.2 g/dL (ref 6.4–8.1)

## 2014-10-24 LAB — CBC WITH DIFFERENTIAL (CANCER CENTER ONLY)
BASO#: 0 10*3/uL (ref 0.0–0.2)
BASO%: 0.2 % (ref 0.0–2.0)
EOS%: 4.5 % (ref 0.0–7.0)
Eosinophils Absolute: 0.4 10*3/uL (ref 0.0–0.5)
HCT: 37.5 % (ref 34.8–46.6)
HGB: 12.6 g/dL (ref 11.6–15.9)
LYMPH#: 2.5 10*3/uL (ref 0.9–3.3)
LYMPH%: 30.3 % (ref 14.0–48.0)
MCH: 30.7 pg (ref 26.0–34.0)
MCHC: 33.6 g/dL (ref 32.0–36.0)
MCV: 91 fL (ref 81–101)
MONO#: 0.8 10*3/uL (ref 0.1–0.9)
MONO%: 10.1 % (ref 0.0–13.0)
NEUT#: 4.5 10*3/uL (ref 1.5–6.5)
NEUT%: 54.9 % (ref 39.6–80.0)
Platelets: 372 10*3/uL (ref 145–400)
RBC: 4.11 10*6/uL (ref 3.70–5.32)
RDW: 14.3 % (ref 11.1–15.7)
WBC: 8.2 10*3/uL (ref 3.9–10.0)

## 2014-10-24 LAB — PREALBUMIN: Prealbumin: 28.3 mg/dL (ref 17.0–34.0)

## 2014-10-24 LAB — LACTATE DEHYDROGENASE: LDH: 121 U/L (ref 94–250)

## 2014-10-24 NOTE — Op Note (Signed)
NAMEANHTHU, PERDEW              ACCOUNT NO.:  [id number]  MEDICAL RECORD NO.:  [id number]  LOCATION:                                 FACILITY:  PHYSICIAN:  Leonides Sake. Lucia Gaskins, M.D.DATE OF BIRTH:  [birth year]  DATE OF PROCEDURE:  10/21/2014 DATE OF DISCHARGE:  10/21/2014                              OPERATIVE REPORT   PREOPERATIVE DIAGNOSES:  Right nasopharyngeal mass, right neck lymphadenopathy, globus symptoms.  POSTOPERATIVE DIAGNOSES:  Right nasopharyngeal mass, right neck lymphadenopathy, globus symptoms.  OPERATIONS:  Excisional biopsy of right upper jugular lymph node, direct laryngoscopy, and excision of right nasopharyngeal mass.  SURGEON:  Leonides Sake. Lucia Gaskins, MD  ANESTHESIA:  General endotracheal.  COMPLICATIONS:  None.  BRIEF CLINICAL NOTE:  Claudett Bayly is a 74 year old female who has had some globus type symptoms with some trouble swallowing and fullness in her throat.  She underwent a CT scan that shows a small 1.5 cm to 2 cm polypoid mass in the right side of the nasopharynx.  She also has significant lymphadenopathy on both sides, right side little bit worse than left.  Because of abnormal polypoid mass in nasopharynx and abnormal lymphadenopathy, she was taken to the operating room this time for excisional biopsy of right nasopharyngeal mass and right neck node biopsy.  DESCRIPTION OF PROCEDURE:  After adequate endotracheal anesthesia, the patient received 2 g Ancef IV preoperatively.  The right neck was initially prepped with Betadine solution and draped out with a sterile towels.  The node was palpated just posterior to the right submandibular gland.  The area directly over the node was injected with 2 mL of Xylocaine with epinephrine for hemostasis.  A transverse incision was made directly over the node.  Dissection was carried down through the subcutaneous tissue, platysma muscle to identify the node which is fairly deep in the neck.  The  node was identified and was dissected out. Bipolar cautery was used for hemostasis.  Node was removed and sent in saline fresh to Pathology.  She had minimal bleeding.  The defect was closed with 3-0 chromic sutures to reapproximate the deep tissues and 5- 0 Vicryl suture subcutaneous to reapproximate the skin edges and followed by Dermabond to reapproximate the skin edges.  This completed the right neck node biopsy.  Next, the patient was turned, first 0 endoscope was utilized to visualize the nasopharynx.  About 1.5 to 2 cm polypoid mass was arising from the nasopharynx on the right side.  Using a through-cut straight forceps, the polypoid mass was excised at its base and was removed intraorally and sent to Pathology in saline fresh. Hemostasis was obtained with suction cautery.  Had a very small stalk and this was cauterized and no substantial bleeding after cauterization. Specimen was sent to Pathology.  Direct laryngoscopy was performed.  On direct laryngoscopy, the remaining nasopharynx was clear.  Base of tongue, vallecula, epiglottis were clear.  Both piriform sinuses were clear.  False and true cords were clear with no abnormal masses noted and no hypopharyngeal abnormalities noted.  This completed the direct laryngoscopy.  The patient was suctioned and awoken from anesthesia and transferred to the recovery room, postop doing well.  DISPOSITION:  [place] was discharged home later this morning on Tylenol, Motrin, or hydrocodone p.r.n. pain.  I will have her follow up in my office in 1 week to review pathology and recheck her neck wound.          ______________________________ Leonides Sake Lucia Gaskins, M.D.     CEN/MEDQ  D:  10/21/2014  T:  10/21/2014  Job:  [id number]  cc:   Unk Pinto, M.D.

## 2014-10-24 NOTE — Telephone Encounter (Signed)
Per order to [employer].  i spoke with Su Hilt in scheduling and sch apt foe 11/01/14 at [facility].  Patient is aware of apt and has instructions

## 2014-10-24 NOTE — Progress Notes (Signed)
Hematology/Oncology Consultation   Name: JADALYNN BURR      MRN: [id number]    Location: Room/bed info not found  Date: 10/24/2014 Time:10:20 AM   REFERRING PHYSICIAN: Vicie Mutters  REASON FOR CONSULT: Nasopharyngeal mass    DIAGNOSIS: Nasopharyngeal mass  HISTORY OF PRESENT ILLNESS: Ms. Machi is a very pleasant 74 yo African American female. She had been seen by Dr. Marin Olp in 2012 for hypercalcemia and released. She has now been referred again for an enlarged right neck node and nasal mass. These areas have been biopsied and we are awaiting the pathology report.  She is doing quite well and has no complaints.  She denies fatigue, fever, chills, n/v, cough, rash, headache, dizziness, SOB, difficulty swallowing, chest pain, palpitations, abdominal pain, constipation, diarrhea, blood in urine or stool. No bleeding.  No swelling, tenderness, numbness or tingling in her extremities. No new aches or pains.  She has a significant family history of cancer. Two sister and maternal grandmother with breast cancer. Mother died of brain cancer, Brother had prostate cancer, other brother had head and neck cancer. Paternal aunt had breast cancer and paternal uncle had prostate cancer.  She was a smoker and quit on January first. She does not drink alcohol.  She is retired and had no occupation exposures.  Her appetite is good and she is staying hydrated. Her weight is stable at 149 lbs.   ROS: All other 10 point review of systems is negative.   PAST MEDICAL HISTORY:   Past Medical History  Diagnosis Date  . Hypertension   . HSV-1 (herpes simplex virus 1) infection   . Diverticulosis   . Colon polyps     ALLERGIES: Allergies  Allergen Reactions  . Lemon Oil   . Shellfish Allergy Swelling      MEDICATIONS:  Current Outpatient Prescriptions on File Prior to Visit  Medication Sig Dispense Refill  . amLODipine (NORVASC) 10 MG tablet Take 1 tablet daily for BP 90 tablet 1  . aspirin EC 81 MG  tablet Take 81 mg by mouth 2 (two) times a week.      . Cholecalciferol (VITAMIN D) 2000 UNITS tablet Take 2,000 Units by mouth daily.     . cloNIDine (CATAPRES) 0.2 MG tablet TAKE 1 TABLET (0.2 MG TOTAL) BY MOUTH 2 (TWO) TIMES DAILY. 180 tablet 99  . Flaxseed, Linseed, (FLAX SEED OIL) 1000 MG CAPS Take by mouth daily.    Marland Kitchen triamterene-hydrochlorothiazide (MAXZIDE-25) 37.5-25 MG per tablet TAKE 1 TABLET BY MOUTH DAILY. 90 tablet 1  . HYDROcodone-acetaminophen (NORCO/VICODIN) 5-325 MG per tablet Take 1-2 tablets by mouth every 6 (six) hours as needed for moderate pain. (Patient not taking: Reported on 10/24/2014) 16 tablet 0   No current facility-administered medications on file prior to visit.     PAST SURGICAL HISTORY Past Surgical History  Procedure Laterality Date  . Eye surgery      cataracts  . Abdominal hysterectomy    . Colonoscopy      FAMILY HISTORY: Family History  Problem Relation Age of Onset  . Hypertension Mother   . Stroke Mother   . Heart attack Father     SOCIAL HISTORY:  reports that she quit smoking about 4 weeks ago. Her smoking use included Cigarettes. She has a 25 pack-year smoking history. She has never used smokeless tobacco. She reports that she does not drink alcohol or use illicit drugs.  PERFORMANCE STATUS: The patient's performance status is 0 - Asymptomatic  PHYSICAL EXAM:  Most Recent Vital Signs: Blood pressure 137/67, pulse 85, temperature 97.4 F (36.3 C), temperature source Oral, resp. rate 16, height 5\' 7"  (1.702 m), weight 149 lb (67.586 kg). BP 137/67 mmHg  Pulse 85  Temp(Src) 97.4 F (36.3 C) (Oral)  Resp 16  Ht 5\' 7"  (1.702 m)  Wt 149 lb (67.586 kg)  BMI 23.33 kg/m2  General Appearance:    Alert, cooperative, no distress, appears stated age  Head:    Normocephalic, without obvious abnormality, atraumatic  Eyes:    PERRL, conjunctiva/corneas clear, EOM's intact, fundi    benign, both eyes        Throat:   Lips, mucosa, and tongue  normal; teeth and gums normal  Neck:   Supple, symmetrical, trachea midline, enlarged right neck lymph node;    thyroid:  no enlargement/tenderness/nodules; no carotid   bruit or JVD  Back:     Symmetric, no curvature, ROM normal, no CVA tenderness  Lungs:     Clear to auscultation bilaterally, respirations unlabored  Chest Wall:    No tenderness or deformity   Heart:    Regular rate and rhythm, S1 and S2 normal, no murmur, rub   or gallop     Abdomen:     Soft, non-tender, bowel sounds active all four quadrants,    no masses, no organomegaly        Extremities:   Extremities normal, atraumatic, no cyanosis or edema  Pulses:   2+ and symmetric all extremities  Skin:   Skin color, texture, turgor normal, no rashes or lesions  Lymph nodes:   Cervical, supraclavicular, and axillary nodes normal  Neurologic:   CNII-XII intact, normal strength, sensation and reflexes    throughout   LABORATORY DATA:  Results for orders placed or performed in visit on 10/24/14 (from the past 48 hour(s))  CBC with Differential [facility])     Status: None   Collection Time: 10/24/14  8:38 AM  Result Value Ref Range   WBC 8.2 3.9 - 10.0 10e3/uL   RBC 4.11 3.70 - 5.32 10e6/uL   HGB 12.6 11.6 - 15.9 g/dL   HCT 37.5 34.8 - 46.6 %   MCV 91 81 - 101 fL   MCH 30.7 26.0 - 34.0 pg   MCHC 33.6 32.0 - 36.0 g/dL   RDW 14.3 11.1 - 15.7 %   Platelets 372 145 - 400 10e3/uL   NEUT# 4.5 1.5 - 6.5 10e3/uL   LYMPH# 2.5 0.9 - 3.3 10e3/uL   MONO# 0.8 0.1 - 0.9 10e3/uL   Eosinophils Absolute 0.4 0.0 - 0.5 10e3/uL   BASO# 0.0 0.0 - 0.2 10e3/uL   NEUT% 54.9 39.6 - 80.0 %   LYMPH% 30.3 14.0 - 48.0 %   MONO% 10.1 0.0 - 13.0 %   EOS% 4.5 0.0 - 7.0 %   BASO% 0.2 0.0 - 2.0 %  COMPREHENSIVE METABOLIC PANEL ([facility] REFLEX ONLY)     Status: Abnormal   Collection Time: 10/24/14  8:38 AM  Result Value Ref Range   Sodium 142 128 - 145 mEq/L   Potassium 3.5 3.3 - 4.7 mEq/L   Chloride 102 98 - 108 mEq/L   CO2 29 18 - 33  mEq/L   Glucose, Bld 100 73 - 118 mg/dL   BUN, Bld 10 7 - 22 mg/dL   Creat 0.8 0.6 - 1.2 mg/dl   Total Bilirubin 0.40 0.20 - 1.60 mg/dl   Alkaline Phosphatase 35 26 - 84 U/L   AST 16  11 - 38 U/L   ALT(SGPT) 19 10 - 47 U/L   Total Protein 7.2 6.4 - 8.1 g/dL   Albumin 3.2 (L) 3.3 - 5.5 g/dL   Calcium 10.2 8.0 - 10.3 mg/dL      RADIOGRAPHY: No results found.     PATHOLOGY: Had biopsy of right neck node and excision of nasal mass. No pathology results back yet.   ASSESSMENT/PLAN: Ms. Mccomber is a very pleasant 74 yo African American female with an enlarged right neck node and nasal mass. These areas have been biopsied and we are awaiting the pathology report.  She is doing well and is asymptomatic at this time.  Her CBC today was normal. We will see what her other labs show.  We will get an echo and also a PET scan on her.  We will schedule her follow-up after receiving her test results.  All questions were answered. She knows to call here with any questions or concerns and to go to the ED in the event of an emergency. We can certainly see her sooner if need be.  The patient was discussed with and also seen by Dr. Marin Olp and he is in agreement with the aforementioned.   [facility] M    Addendum:  I saw and examined the patient. Although we do not have the pathology results report back yet. 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 I suspect that lymphoma could also be a possibility.  I think a PET scan would be helpful. I have ordered this. I think an echocardiogram would be helpful.  She is 74 years old.. She has a decent performance status (ECOG 1) so we could be aggressive.  For nasopharyngeal carcinoma, typically, concurrent chemoradiation therapies given followed by "adjuvant" chemotherapy. However, given her age, much sure how well she'll be oh to tolerate "adjuvant" chemotherapy.  I think that studies have shown that weekly carboplatinum with  radiation would certainly be feasible.  Once we get the path report back, and we see with the PET scan shows, then we can move ahead with therapy.  We spent about an hour with her today. We explained what the situation was. I explained to her that nasopharyngeal cancer oftentimes is initiated with the Epstein-Barr virus.  I answered all of her questions.  I suspect that we probably can get going with treatment in a couple weeks. She really looks good and is not that symptomatic so we have time to do the appropriate studies.  Lum Keas

## 2014-10-24 NOTE — Telephone Encounter (Signed)
Per Md order to sch Echo.  i called and spoke with Jenny Reichmann and sch patient's echo apt for 11/01/14 at 10am

## 2014-10-25 ENCOUNTER — Other Ambulatory Visit (HOSPITAL_COMMUNITY): Payer: Medicare Other

## 2014-11-01 ENCOUNTER — Ambulatory Visit (HOSPITAL_COMMUNITY)
Admission: RE | Admit: 2014-11-01 | Discharge: 2014-11-01 | Disposition: A | Discharge status: Home / Self Care | Payer: Medicare Other | Source: Ambulatory Visit | Attending: Hematology & Oncology | Admitting: Hematology & Oncology

## 2014-11-01 DIAGNOSIS — Z08 Encounter for follow-up examination after completed treatment for malignant neoplasm: Secondary | ICD-10-CM

## 2014-11-01 DIAGNOSIS — C119 Malignant neoplasm of nasopharynx, unspecified: Secondary | ICD-10-CM | POA: Insufficient documentation

## 2014-11-01 DIAGNOSIS — Z87891 Personal history of nicotine dependence: Secondary | ICD-10-CM | POA: Insufficient documentation

## 2014-11-01 DIAGNOSIS — E785 Hyperlipidemia, unspecified: Secondary | ICD-10-CM | POA: Diagnosis not present

## 2014-11-01 DIAGNOSIS — I10 Essential (primary) hypertension: Secondary | ICD-10-CM | POA: Diagnosis not present

## 2014-11-01 DIAGNOSIS — I348 Other nonrheumatic mitral valve disorders: Secondary | ICD-10-CM

## 2014-11-01 LAB — GLUCOSE, CAPILLARY: Glucose-Capillary: 107 mg/dL — ABNORMAL HIGH (ref 70–99)

## 2014-11-01 MED ORDER — FLUDEOXYGLUCOSE F - 18 (FDG) INJECTION
7.4000 | Freq: Once | INTRAVENOUS | Status: AC | PRN
  Administered 2014-11-01: 7.4 via INTRAVENOUS

## 2014-11-01 NOTE — Progress Notes (Signed)
  Echocardiogram 2D Echocardiogram has been performed.  Ronak Duquette FRANCES 11/01/2014, 10:28 AM

## 2014-11-08 ENCOUNTER — Telehealth: Payer: Self-pay | Admitting: Hematology & Oncology

## 2014-11-08 NOTE — Telephone Encounter (Signed)
Pt called wanting appointment said test results should be back. I printed out test results and told pt I would call her back after I talk to MD. I showed results to Dr. Marin Olp he said for me not to call pt back that he would I asked him several times if he wanted me to call pt and let her know he would be calling her and he said no.

## 2014-12-18 ENCOUNTER — Other Ambulatory Visit: Payer: Self-pay | Admitting: Internal Medicine

## 2015-01-05 ENCOUNTER — Encounter: Payer: Self-pay | Admitting: Internal Medicine

## 2015-01-05 ENCOUNTER — Ambulatory Visit (INDEPENDENT_AMBULATORY_CARE_PROVIDER_SITE_OTHER): Payer: Medicare Other | Admitting: Internal Medicine

## 2015-01-05 VITALS — BP 136/78 | HR 62 | Temp 98.2°F | Resp 16 | Ht 67.25 in | Wt 151.0 lb

## 2015-01-05 DIAGNOSIS — Z79899 Other long term (current) drug therapy: Secondary | ICD-10-CM

## 2015-01-05 DIAGNOSIS — E782 Mixed hyperlipidemia: Secondary | ICD-10-CM

## 2015-01-05 DIAGNOSIS — E559 Vitamin D deficiency, unspecified: Secondary | ICD-10-CM

## 2015-01-05 DIAGNOSIS — R7309 Other abnormal glucose: Secondary | ICD-10-CM

## 2015-01-05 DIAGNOSIS — I10 Essential (primary) hypertension: Secondary | ICD-10-CM

## 2015-01-05 DIAGNOSIS — N182 Chronic kidney disease, stage 2 (mild): Secondary | ICD-10-CM

## 2015-01-05 DIAGNOSIS — R7303 Prediabetes: Secondary | ICD-10-CM

## 2015-01-05 DIAGNOSIS — K219 Gastro-esophageal reflux disease without esophagitis: Secondary | ICD-10-CM | POA: Insufficient documentation

## 2015-01-05 LAB — CBC WITH DIFFERENTIAL/PLATELET
BASOS PCT: 1 % (ref 0–1)
Basophils Absolute: 0.1 10*3/uL (ref 0.0–0.1)
Eosinophils Absolute: 0.4 10*3/uL (ref 0.0–0.7)
Eosinophils Relative: 8 % — ABNORMAL HIGH (ref 0–5)
HCT: 37.3 % (ref 36.0–46.0)
HEMOGLOBIN: 12.4 g/dL (ref 12.0–15.0)
Lymphocytes Relative: 33 % (ref 12–46)
Lymphs Abs: 1.7 10*3/uL (ref 0.7–4.0)
MCH: 29.2 pg (ref 26.0–34.0)
MCHC: 33.2 g/dL (ref 30.0–36.0)
MCV: 88 fL (ref 78.0–100.0)
MONOS PCT: 12 % (ref 3–12)
MPV: 8.6 fL (ref 8.6–12.4)
Monocytes Absolute: 0.6 10*3/uL (ref 0.1–1.0)
NEUTROS ABS: 2.4 10*3/uL (ref 1.7–7.7)
Neutrophils Relative %: 46 % (ref 43–77)
PLATELETS: 372 10*3/uL (ref 150–400)
RBC: 4.24 MIL/uL (ref 3.87–5.11)
RDW: 16.4 % — ABNORMAL HIGH (ref 11.5–15.5)
WBC: 5.3 10*3/uL (ref 4.0–10.5)

## 2015-01-05 LAB — HEMOGLOBIN A1C
Hgb A1c MFr Bld: 6.2 % — ABNORMAL HIGH (ref <5.7)
Mean Plasma Glucose: 131 mg/dL — ABNORMAL HIGH (ref <117)

## 2015-01-05 MED ORDER — RANITIDINE HCL 300 MG PO TABS: ORAL_TABLET | ORAL | Status: DC

## 2015-01-05 NOTE — Progress Notes (Signed)
Patient ID: Catherine Parks, female   DOB: 11-08-1940, 74 y.o.   MRN: [id number]   This very nice 74 y.o. MWF presents for 3 month follow up with Hypertension, Hyperlipidemia, Pre-Diabetes and Vitamin D Deficiency.    Patient is treated for HTN & BP has been controlled at home. Today's BP: 136/78 mmHg. Patient has had no complaints of any cardiac type chest pain, palpitations, dyspnea/orthopnea/PND, dizziness, claudication, or dependent edema.   Hyperlipidemia is controlled with diet & meds. Patient denies myalgias or other med SE's. Last Lipids were at goal - Total Chol 194; HDL 79; LDL  93; Trig 108 on 09/27/2014.   Also, the patient has history of PreDiabetes and has had no symptoms of reactive hypoglycemia, diabetic polys, paresthesias or visual blurring.  Last A1c was   5.7% on 09/27/2014.   Further, the patient also has history of Vitamin D Deficiency and supplements vitamin D without any suspected side-effects. Last vitamin D was 46 on 09/27/2014.  Medication Sig  . amLODipine  10 MG tablet Take 1 tablet daily for BP  . aspirin EC 81 MG tablet Take 81 mg by mouth 2 (two) times a week.    Marland Kitchen VITAMIN D 2000 UNITS  Take 2,000 Units by mouth daily.   . cloNIDine  0.2 MG tablet TAKE 1 TABLET (0.2 MG TOTAL) BY MOUTH 2 (TWO) TIMES DAILY.  Marland Kitchen FLAX SEED OIL 1000 MG  Take by mouth daily.  . NORCO/) 5-325 MG per tablet Take 1-2 tablets by mouth every 6 (six) hours as needed for moderate pain.  Marland Kitchen triamterene-hctz (MAXZIDE-25) 37.5-25  TAKE 1 TABLET BY MOUTH DAILY.   Allergies  Allergen Reactions  . Lemon Oil   . Shellfish Allergy Swelling   PMHx:   Past Medical History  Diagnosis Date  . Hypertension   . HSV-1 (herpes simplex virus 1) infection   . Diverticulosis   . Colon polyps    Immunization History  Administered Date(s) Administered  . DTaP 07/30/2011  . Pneumococcal Polysaccharide-23 05/11/2013  . Pneumococcal-Unspecified 01/07/2002  . Td 01/07/2002, 05/07/2012   Past Surgical  History  Procedure Laterality Date  . Eye surgery      cataracts  . Abdominal hysterectomy    . Colonoscopy    . Direct laryngoscopy N/A 10/21/2014    Procedure: DIRECT LARYNGOSCOPY;  Surgeon: Rozetta Nunnery, MD;  Location: [facility];  Service: ENT;  Laterality: N/A;  . Mass excision Right 10/21/2014    Procedure: RIGHT NECK NODE BIOPSY;  Surgeon: Rozetta Nunnery, MD;  Location: [place];  Service: ENT;  Laterality: Right;  . Excision nasal mass Right 10/21/2014    Procedure: EXCISION NASOPHARYNGEAL MASS;  Surgeon: Rozetta Nunnery, MD;  Location: [facility];  Service: ENT;  Laterality: Right;   FHx:    Reviewed / unchanged  SHx:    Reviewed / unchanged  Systems Review:  Constitutional: Denies fever, chills, wt changes, headaches, insomnia, fatigue, night sweats, change in appetite. Eyes: Denies redness, blurred vision, diplopia, discharge, itchy, watery eyes.  ENT: Denies discharge, congestion, post nasal drip, epistaxis, sore throat, earache, hearing loss, dental pain, tinnitus, vertigo, sinus pain, snoring.  CV: Denies chest pain, palpitations, irregular heartbeat, syncope, dyspnea, diaphoresis, orthopnea, PND, claudication or edema. Respiratory: denies cough, dyspnea, DOE, pleurisy, hoarseness, laryngitis, wheezing.  Gastrointestinal: Denies dysphagia, odynophagia, heartburn, reflux, water brash, abdominal pain or cramps, nausea, vomiting, bloating, diarrhea, constipation, hematemesis, melena, hematochezia  or hemorrhoids. Genitourinary: Denies dysuria, frequency,  urgency, nocturia, hesitancy, discharge, hematuria or flank pain. Musculoskeletal: Denies arthralgias, myalgias, stiffness, jt. swelling, pain, limping or strain/sprain.  Skin: Denies pruritus, rash, hives, warts, acne, eczema or change in skin lesion(s). Neuro: No weakness, tremor, incoordination, spasms, paresthesia or pain. Psychiatric: Denies confusion, memory loss  or sensory loss. Endo: Denies change in weight, skin or hair change.  Heme/Lymph: No excessive bleeding, bruising or enlarged lymph nodes.  Physical Exam  BP 136/78   Pulse 62  Temp 98.2 F   Resp 16  Ht 5' 7.25"   Wt 151 lb    BMI 23.48   Appears well nourished and in no distress. Eyes: PERRLA, EOMs, conjunctiva no swelling or erythema. Sinuses: No frontal/maxillary tenderness ENT/Mouth: EAC's clear, TM's nl w/o erythema, bulging. Nares clear w/o erythema, swelling, exudates. Oropharynx clear without erythema or exudates. Oral hygiene is good. Tongue normal, non obstructing. Hearing intact.  Neck: Supple. Thyroid nl. Car 2+/2+ without bruits, nodes or JVD. Chest: Respirations nl with BS clear & equal w/o rales, rhonchi, wheezing or stridor.  Cor: Heart sounds normal w/ regular rate and rhythm without sig. murmurs, gallops, clicks, or rubs. Peripheral pulses normal and equal  without edema.  Abdomen: Soft & bowel sounds normal. Non-tender w/o guarding, rebound, hernias, masses, or organomegaly.  Lymphatics: Unremarkable.  Musculoskeletal: Full ROM all peripheral extremities, joint stability, 5/5 strength, and normal gait.  Skin: Warm, dry without exposed rashes, lesions or ecchymosis apparent.  Neuro: Cranial nerves intact, reflexes equal bilaterally. Sensory-motor testing grossly intact. Tendon reflexes grossly intact.  Pysch: Alert & oriented x 3.  Insight and judgement nl & appropriate. No ideations.  Assessment and Plan:  1. Essential hypertension  - TSH  2. Hyperlipidemia  - Lipid panel  3. Prediabetes  - Hemoglobin A1c - Insulin, random  4. Vitamin D deficiency  - Vit D  25 hydroxy   5. Gastroesophageal reflux disease  - Discussed anti-Dyspeptic Diet  - Rx Ranitidine 300 mg  1 to 2 tabs qd   6. CKD Stage II (GFR 65 ml/min)   7. Medication management  - CBC with Differential/Platelet - BASIC METABOLIC PANEL WITH GFR - Hepatic function panel -  Magnesium      Recommended regular exercise, BP monitoring, weight control, and discussed med and SE's. Recommended labs to assess and monitor clinical status. Further disposition pending results of labs. Over 30 minutes of exam, counseling, chart review was performed

## 2015-01-05 NOTE — Patient Instructions (Signed)
GETTING OFF OF PPI's    Nexium/protonix/prilosec/Omeprazole/Dexilant/Aciphex are called PPI's, they are great at healing your stomach but should only be taken for a short period of time.     Recent studies have shown that taken for a long time they  can increase the risk of osteoporosis (weakening of your bones), pneumonia, low magnesium, restless legs, Cdiff (infection that causes diarrhea), DEMENTIA and most recently kidney damage / disease / insufficiency.     Due to this information we want to try to stop the PPI but if you try to stop it abruptly this can cause rebound acid and worsening symptoms.   So this is how we want you to get off the PPI:  - Start taking the nexium/protonix/prilosec/PPI  every other day with  zantac (ranitidine) 2 x a day for 2-4 weeks  - then decrease the PPI to every 3 days while taking the zantac (ranitidine) twice a day the other  days for 2-4  Weeks  - then you can try the zantac (ranitidine) once at night or up to 2 x day as needed.  - you can continue on this once at night or stop all together  - Avoid alcohol, spicy foods, NSAIDS (aleve, ibuprofen) at this time. See foods below.   +++++++++++++++++++++++++++++++++++++++++++  Food Choices for Gastroesophageal Reflux Disease  When you have gastroesophageal reflux disease (GERD), the foods you eat and your eating habits are very important. Choosing the right foods can help ease the discomfort of GERD. WHAT GENERAL GUIDELINES DO I NEED TO FOLLOW?  Choose fruits, vegetables, whole grains, low-fat dairy products, and low-fat meat, fish, and poultry.  Limit fats such as oils, salad dressings, butter, nuts, and avocado.  Keep a food diary to identify foods that cause symptoms.  Avoid foods that cause reflux. These may be different for different people.  Eat frequent small meals instead of three large meals each day.  Eat your meals slowly, in a relaxed setting.  Limit fried foods.  Cook foods  using methods other than frying.  Avoid drinking alcohol.  Avoid drinking large amounts of liquids with your meals.  Avoid bending over or lying down until 2-3 hours after eating.   WHAT FOODS ARE NOT RECOMMENDED? The following are some foods and drinks that may worsen your symptoms:  Vegetables Tomatoes. Tomato juice. Tomato and spaghetti sauce. Chili peppers. Onion and garlic. Horseradish. Fruits Oranges, grapefruit, and lemon (fruit and juice). Meats High-fat meats, fish, and poultry. This includes hot dogs, ribs, ham, sausage, salami, and bacon. Dairy Whole milk and chocolate milk. Sour cream. Cream. Butter. Ice cream. Cream cheese.  Beverages Coffee and tea, with or without caffeine. Carbonated beverages or energy drinks. Condiments Hot sauce. Barbecue sauce.  Sweets/Desserts Chocolate and cocoa. Donuts. Peppermint and spearmint. Fats and Oils High-fat foods, including French fries and potato chips. Other Vinegar. Strong spices, such as black pepper, white pepper, red pepper, cayenne, curry powder, cloves, ginger, and chili powder. Nexium/protonix/prilosec are called PPI's, they are great at healing your stomach but should only be taken for a short period of time.     ++++++++++++++++++++++++++++++++++  Recommend the book "The END of DIETING" by Dr Joel Fuhrman   & the book "The END of DIABETES " by Dr Joel Fuhrman  At [employer] - get book & Audio CD's      Being diabetic has a  300% increased risk for heart attack, stroke, cancer, and alzheimer- type vascular dementia. It is very important that you work harder   with diet by avoiding all foods that are white. Avoid white rice (brown & wild rice is OK), white potatoes (sweetpotatoes in moderation is OK), White bread or wheat bread or anything made out of white flour like bagels, donuts, rolls, buns, biscuits, cakes, pastries, cookies, pizza crust, and pasta (made from white flour & egg whites) - vegetarian pasta or  spinach or wheat pasta is OK. Multigrain breads like Arnold's or Pepperidge Farm, or multigrain sandwich thins or flatbreads.  Diet, exercise and weight loss can reverse and cure diabetes in the early stages.  Diet, exercise and weight loss is very important in the control and prevention of complications of diabetes which affects every system in your body, ie. Brain - dementia/stroke, eyes - glaucoma/blindness, heart - heart attack/heart failure, kidneys - dialysis, stomach - gastric paralysis, intestines - malabsorption, nerves - severe painful neuritis, circulation - gangrene & loss of a leg(s), and finally cancer and Alzheimers.    I recommend avoid fried & greasy foods,  sweets/candy, white rice (brown or wild rice or Quinoa is OK), white potatoes (sweet potatoes are OK) - anything made from white flour - bagels, doughnuts, rolls, buns, biscuits,white and wheat breads, pizza crust and traditional pasta made of white flour & egg white(vegetarian pasta or spinach or wheat pasta is OK).  Multi-grain bread is OK - like multi-grain flat bread or sandwich thins. Avoid alcohol in excess. Exercise is also important.    Eat all the vegetables you want - avoid meat, especially red meat and dairy - especially cheese.  Cheese is the most concentrated form of trans-fats which is the worst thing to clog up our arteries. Veggie cheese is OK which can be found in the fresh produce section at [employer] or [employer] or [employer]   

## 2015-01-06 ENCOUNTER — Encounter: Payer: Self-pay | Admitting: Gastroenterology

## 2015-01-06 LAB — LIPID PANEL
Cholesterol: 214 mg/dL — ABNORMAL HIGH (ref 0–200)
HDL: 80 mg/dL (ref >=46)
LDL CALC: 110 mg/dL — AB (ref 0–99)
TRIGLYCERIDES: 120 mg/dL (ref <150)
Total CHOL/HDL Ratio: 2.7 Ratio
VLDL: 24 mg/dL (ref 0–40)

## 2015-01-06 LAB — BASIC METABOLIC PANEL WITH GFR
BUN: 16 mg/dL (ref 6–23)
CO2: 24 mEq/L (ref 19–32)
Calcium: 10.1 mg/dL (ref 8.4–10.5)
Chloride: 102 mEq/L (ref 96–112)
Creat: 0.83 mg/dL (ref 0.50–1.10)
GFR, EST AFRICAN AMERICAN: 80 mL/min
GFR, EST NON AFRICAN AMERICAN: 70 mL/min
Glucose, Bld: 98 mg/dL (ref 70–99)
POTASSIUM: 4 meq/L (ref 3.5–5.3)
Sodium: 139 mEq/L (ref 135–145)

## 2015-01-06 LAB — HEPATIC FUNCTION PANEL
ALK PHOS: 62 U/L (ref 39–117)
ALT: 9 U/L (ref 0–35)
AST: 14 U/L (ref 0–37)
Albumin: 3.7 g/dL (ref 3.5–5.2)
BILIRUBIN INDIRECT: 0.2 mg/dL (ref 0.2–1.2)
BILIRUBIN TOTAL: 0.3 mg/dL (ref 0.2–1.2)
Bilirubin, Direct: 0.1 mg/dL (ref 0.0–0.3)
Total Protein: 7.4 g/dL (ref 6.0–8.3)

## 2015-01-06 LAB — VITAMIN D 25 HYDROXY (VIT D DEFICIENCY, FRACTURES): Vit D, 25-Hydroxy: 42 ng/mL (ref 30–100)

## 2015-01-06 LAB — MAGNESIUM: Magnesium: 1.9 mg/dL (ref 1.5–2.5)

## 2015-01-06 LAB — TSH: TSH: 1.208 u[IU]/mL (ref 0.350–4.500)

## 2015-01-06 LAB — INSULIN, RANDOM: INSULIN: 5.9 u[IU]/mL (ref 2.0–19.6)

## 2015-03-15 ENCOUNTER — Other Ambulatory Visit: Payer: Self-pay | Admitting: Internal Medicine

## 2015-04-10 ENCOUNTER — Ambulatory Visit (INDEPENDENT_AMBULATORY_CARE_PROVIDER_SITE_OTHER): Payer: Medicare Other | Admitting: Internal Medicine

## 2015-04-10 ENCOUNTER — Encounter: Payer: Self-pay | Admitting: Internal Medicine

## 2015-04-10 VITALS — BP 122/64 | HR 70 | Temp 98.4°F | Resp 16 | Ht 67.25 in | Wt 147.0 lb

## 2015-04-10 DIAGNOSIS — E559 Vitamin D deficiency, unspecified: Secondary | ICD-10-CM

## 2015-04-10 DIAGNOSIS — R7309 Other abnormal glucose: Secondary | ICD-10-CM

## 2015-04-10 DIAGNOSIS — E663 Overweight: Secondary | ICD-10-CM

## 2015-04-10 DIAGNOSIS — N182 Chronic kidney disease, stage 2 (mild): Secondary | ICD-10-CM

## 2015-04-10 DIAGNOSIS — I10 Essential (primary) hypertension: Secondary | ICD-10-CM

## 2015-04-10 DIAGNOSIS — E782 Mixed hyperlipidemia: Secondary | ICD-10-CM

## 2015-04-10 DIAGNOSIS — R7303 Prediabetes: Secondary | ICD-10-CM

## 2015-04-10 DIAGNOSIS — Z79899 Other long term (current) drug therapy: Secondary | ICD-10-CM

## 2015-04-10 LAB — BASIC METABOLIC PANEL WITH GFR
BUN: 14 mg/dL (ref 7–25)
CALCIUM: 10.6 mg/dL — AB (ref 8.6–10.4)
CHLORIDE: 102 mmol/L (ref 98–110)
CO2: 27 mmol/L (ref 20–31)
Creat: 0.97 mg/dL — ABNORMAL HIGH (ref 0.60–0.93)
GFR, EST AFRICAN AMERICAN: 67 mL/min (ref >=60)
GFR, EST NON AFRICAN AMERICAN: 58 mL/min — AB (ref >=60)
Glucose, Bld: 98 mg/dL (ref 65–99)
POTASSIUM: 3.6 mmol/L (ref 3.5–5.3)
Sodium: 138 mmol/L (ref 135–146)

## 2015-04-10 LAB — HEPATIC FUNCTION PANEL
ALT: 12 U/L (ref 6–29)
AST: 15 U/L (ref 10–35)
Albumin: 4.2 g/dL (ref 3.6–5.1)
Alkaline Phosphatase: 56 U/L (ref 33–130)
BILIRUBIN TOTAL: 0.4 mg/dL (ref 0.2–1.2)
Bilirubin, Direct: 0.1 mg/dL (ref <=0.2)
Indirect Bilirubin: 0.3 mg/dL (ref 0.2–1.2)
TOTAL PROTEIN: 7.5 g/dL (ref 6.1–8.1)

## 2015-04-10 LAB — CBC WITH DIFFERENTIAL/PLATELET
BASOS PCT: 0 % (ref 0–1)
Basophils Absolute: 0 10*3/uL (ref 0.0–0.1)
EOS PCT: 7 % — AB (ref 0–5)
Eosinophils Absolute: 0.3 10*3/uL (ref 0.0–0.7)
HEMATOCRIT: 35.6 % — AB (ref 36.0–46.0)
Hemoglobin: 11.9 g/dL — ABNORMAL LOW (ref 12.0–15.0)
Lymphocytes Relative: 50 % — ABNORMAL HIGH (ref 12–46)
Lymphs Abs: 2.1 10*3/uL (ref 0.7–4.0)
MCH: 29.5 pg (ref 26.0–34.0)
MCHC: 33.4 g/dL (ref 30.0–36.0)
MCV: 88.1 fL (ref 78.0–100.0)
MONO ABS: 0.4 10*3/uL (ref 0.1–1.0)
MONOS PCT: 9 % (ref 3–12)
MPV: 8.4 fL — ABNORMAL LOW (ref 8.6–12.4)
NEUTROS ABS: 1.4 10*3/uL — AB (ref 1.7–7.7)
Neutrophils Relative %: 34 % — ABNORMAL LOW (ref 43–77)
Platelets: 361 10*3/uL (ref 150–400)
RBC: 4.04 MIL/uL (ref 3.87–5.11)
RDW: 16.3 % — ABNORMAL HIGH (ref 11.5–15.5)
WBC: 4.2 10*3/uL (ref 4.0–10.5)

## 2015-04-10 LAB — MAGNESIUM: MAGNESIUM: 2.2 mg/dL (ref 1.5–2.5)

## 2015-04-10 LAB — LIPID PANEL
CHOLESTEROL: 204 mg/dL — AB (ref 125–200)
HDL: 85 mg/dL (ref >=46)
LDL Cholesterol: 102 mg/dL (ref <130)
Total CHOL/HDL Ratio: 2.4 Ratio (ref <=5.0)
Triglycerides: 83 mg/dL (ref <150)
VLDL: 17 mg/dL (ref <30)

## 2015-04-10 LAB — TSH: TSH: 0.979 u[IU]/mL (ref 0.350–4.500)

## 2015-04-10 LAB — HEMOGLOBIN A1C
Hgb A1c MFr Bld: 6 % — ABNORMAL HIGH (ref <5.7)
Mean Plasma Glucose: 126 mg/dL — ABNORMAL HIGH (ref <117)

## 2015-04-10 NOTE — Progress Notes (Signed)
Patient ID: Catherine Parks, female   DOB: 01-24-1941, 74 y.o.   MRN: [id number]  Assessment and Plan:  Hypertension:  -Continue medication,  -monitor blood pressure at home.  -Continue DASH diet.   -Reminder to go to the ER if any CP, SOB, nausea, dizziness, severe HA, changes vision/speech, left arm numbness and tingling, and jaw pain.  Cholesterol: -Continue diet and exercise.  -Check cholesterol.   Pre-diabetes: -Continue diet and exercise.  -Check A1C  Vitamin D Def: -check level -continue medications.   Shortness of breath -questionable COPD vs. Asthma given eczema and allergic rhinitis history -dulera sample given  Continue diet and meds as discussed. Further disposition pending results of labs.  HPI 74 y.o. female  presents for 3 month follow up with hypertension, hyperlipidemia, prediabetes and vitamin D.   Her blood pressure has been controlled at home, today their BP is BP: 122/64 mmHg.   She does workout.  She reports that she does try to walk some days.  She reports that she also tries to go to the [facility] at least once per week.   She denies chest pain, shortness of breath, dizziness.   She is not on cholesterol medication and denies myalgias. Her cholesterol is not at goal. The cholesterol last visit was:   Lab Results  Component Value Date   CHOL 214* 01/05/2015   HDL 80 01/05/2015   LDLCALC 110* 01/05/2015   TRIG 120 01/05/2015   CHOLHDL 2.7 01/05/2015     She has been working on diet and exercise for prediabetes, and denies foot ulcerations, hyperglycemia, hypoglycemia , increased appetite, nausea, paresthesia of the feet, polydipsia, polyuria, visual disturbances, vomiting and weight loss. Last A1C in the office was:  Lab Results  Component Value Date   HGBA1C 6.2* 01/05/2015    Patient is on Vitamin D supplement.  Lab Results  Component Value Date   VD25OH 42 01/05/2015     Patient reports that she is occasionally short of breath sometimes when she  wakes up.  She reports that during the day she does okay, but first thing in the morning she experiences some shortness of breath coming down the stairs.    She reports that she has not smoked since January.  She reports that she feels a lot better.    She reports that her reflux has been doing well.  She just takes her zantac as needed.    Current Medications:  Current Outpatient Prescriptions on File Prior to Visit  Medication Sig Dispense Refill  . amLODipine (NORVASC) 10 MG tablet Take 1 tablet daily for BP 90 tablet 1  . aspirin EC 81 MG tablet Take 81 mg by mouth 2 (two) times a week.      . Cholecalciferol (VITAMIN D) 2000 UNITS tablet Take 2,000 Units by mouth daily.     . cloNIDine (CATAPRES) 0.2 MG tablet TAKE 1 TABLET (0.2 MG TOTAL) BY MOUTH 2 (TWO) TIMES DAILY. 180 tablet 99  . Flaxseed, Linseed, (FLAX SEED OIL) 1000 MG CAPS Take by mouth daily.    Marland Kitchen HYDROcodone-acetaminophen (NORCO/VICODIN) 5-325 MG per tablet Take 1-2 tablets by mouth every 6 (six) hours as needed for moderate pain. 16 tablet 0  . omeprazole (PRILOSEC) 40 MG capsule Take 40 mg by mouth daily.    Marland Kitchen triamterene-hydrochlorothiazide (MAXZIDE-25) 37.5-25 MG per tablet TAKE 1 TABLET BY MOUTH DAILY. 90 tablet 1   No current facility-administered medications on file prior to visit.    Medical History:  Past  Medical History  Diagnosis Date  . Hypertension   . HSV-1 (herpes simplex virus 1) infection   . Diverticulosis   . Colon polyps     Allergies:  Allergies  Allergen Reactions  . Lemon Oil   . Shellfish Allergy Swelling     Review of Systems:  Review of Systems  Constitutional: Negative for fever, chills and malaise/fatigue.  HENT: Negative for congestion, ear pain and sore throat.   Respiratory: Positive for shortness of breath. Negative for cough and wheezing.   Cardiovascular: Negative for chest pain, palpitations and leg swelling.  Gastrointestinal: Negative for heartburn, abdominal pain,  diarrhea, constipation, blood in stool and melena.  Genitourinary: Negative.   Skin: Negative.   Neurological: Negative for dizziness, sensory change, loss of consciousness and headaches.  Psychiatric/Behavioral: Negative for depression. The patient is not nervous/anxious and does not have insomnia.     Family history- Review and unchanged  Social history- Review and unchanged  Physical Exam: BP 122/64 mmHg  Pulse 70  Temp(Src) 98.4 F (36.9 C) (Temporal)  Resp 16  Ht 5' 7.25" (1.708 m)  Wt 147 lb (66.679 kg)  BMI 22.86 kg/m2 Wt Readings from Last 3 Encounters:  04/10/15 147 lb (66.679 kg)  01/05/15 151 lb (68.493 kg)  10/24/14 149 lb (67.586 kg)    General Appearance: Well nourished well developed, in no apparent distress. Eyes: PERRLA, EOMs, conjunctiva no swelling or erythema ENT/Mouth: Ear canals normal without obstruction, swelling, erythma, discharge.  TMs normal bilaterally.  Oropharynx moist, clear, without exudate, or postoropharyngeal swelling. Neck: Supple, thyroid normal,no cervical adenopathy  Respiratory: Respiratory effort normal, Breath sounds clear A&P without rhonchi, wheeze, or rale.  No retractions, no accessory usage. Cardio: RRR with no MRGs. Brisk peripheral pulses without edema.  Abdomen: Soft, + BS,  Non tender, no guarding, rebound, hernias, masses. Musculoskeletal: Full ROM, 5/5 strength, Normal gait Skin: Warm, dry without rashes, lesions, ecchymosis.  Neuro: Awake and oriented X 3, Cranial nerves intact. Normal muscle tone, no cerebellar symptoms. Psych: Normal affect, Insight and Judgment appropriate.    [name], PA-C 10:02 AM [facility]

## 2015-04-11 LAB — VITAMIN D 25 HYDROXY (VIT D DEFICIENCY, FRACTURES): Vit D, 25-Hydroxy: 66 ng/mL (ref 30–100)

## 2015-04-11 LAB — INSULIN, RANDOM: Insulin: 3.2 u[IU]/mL (ref 2.0–19.6)

## 2015-05-15 ENCOUNTER — Other Ambulatory Visit: Payer: Self-pay | Admitting: Internal Medicine

## 2015-06-12 ENCOUNTER — Encounter: Payer: Self-pay | Admitting: Internal Medicine

## 2015-07-26 ENCOUNTER — Other Ambulatory Visit: Payer: Self-pay | Admitting: Internal Medicine

## 2015-08-04 ENCOUNTER — Encounter: Payer: Self-pay | Admitting: Internal Medicine

## 2015-08-04 ENCOUNTER — Ambulatory Visit (INDEPENDENT_AMBULATORY_CARE_PROVIDER_SITE_OTHER): Payer: Medicare Other | Admitting: Internal Medicine

## 2015-08-04 VITALS — BP 116/80 | HR 56 | Temp 97.5°F | Resp 16 | Ht 67.25 in | Wt 149.0 lb

## 2015-08-04 DIAGNOSIS — Z1389 Encounter for screening for other disorder: Secondary | ICD-10-CM

## 2015-08-04 DIAGNOSIS — K219 Gastro-esophageal reflux disease without esophagitis: Secondary | ICD-10-CM

## 2015-08-04 DIAGNOSIS — Z789 Other specified health status: Secondary | ICD-10-CM | POA: Diagnosis not present

## 2015-08-04 DIAGNOSIS — Z6822 Body mass index (BMI) 22.0-22.9, adult: Secondary | ICD-10-CM | POA: Insufficient documentation

## 2015-08-04 DIAGNOSIS — Z0000 Encounter for general adult medical examination without abnormal findings: Secondary | ICD-10-CM | POA: Diagnosis not present

## 2015-08-04 DIAGNOSIS — I10 Essential (primary) hypertension: Secondary | ICD-10-CM

## 2015-08-04 DIAGNOSIS — F17200 Nicotine dependence, unspecified, uncomplicated: Secondary | ICD-10-CM

## 2015-08-04 DIAGNOSIS — Z1212 Encounter for screening for malignant neoplasm of rectum: Secondary | ICD-10-CM

## 2015-08-04 DIAGNOSIS — E559 Vitamin D deficiency, unspecified: Secondary | ICD-10-CM

## 2015-08-04 DIAGNOSIS — R7303 Prediabetes: Secondary | ICD-10-CM

## 2015-08-04 DIAGNOSIS — E782 Mixed hyperlipidemia: Secondary | ICD-10-CM

## 2015-08-04 DIAGNOSIS — N182 Chronic kidney disease, stage 2 (mild): Secondary | ICD-10-CM

## 2015-08-04 DIAGNOSIS — Z0001 Encounter for general adult medical examination with abnormal findings: Secondary | ICD-10-CM

## 2015-08-04 DIAGNOSIS — Z79899 Other long term (current) drug therapy: Secondary | ICD-10-CM

## 2015-08-04 DIAGNOSIS — Z1331 Encounter for screening for depression: Secondary | ICD-10-CM

## 2015-08-04 DIAGNOSIS — Z9181 History of falling: Secondary | ICD-10-CM

## 2015-08-04 LAB — HEPATIC FUNCTION PANEL
ALT: 10 U/L (ref 6–29)
AST: 14 U/L (ref 10–35)
Albumin: 4 g/dL (ref 3.6–5.1)
Alkaline Phosphatase: 61 U/L (ref 33–130)
Bilirubin, Direct: <0.1 mg/dL (ref <=0.2)
TOTAL PROTEIN: 7.6 g/dL (ref 6.1–8.1)
Total Bilirubin: 0.2 mg/dL (ref 0.2–1.2)

## 2015-08-04 LAB — BASIC METABOLIC PANEL WITH GFR
BUN: 17 mg/dL (ref 7–25)
CALCIUM: 10.7 mg/dL — AB (ref 8.6–10.4)
CHLORIDE: 101 mmol/L (ref 98–110)
CO2: 26 mmol/L (ref 20–31)
Creat: 0.9 mg/dL (ref 0.60–0.93)
GFR, EST NON AFRICAN AMERICAN: 63 mL/min (ref >=60)
GFR, Est African American: 73 mL/min (ref >=60)
GLUCOSE: 94 mg/dL (ref 65–99)
Potassium: 4.1 mmol/L (ref 3.5–5.3)
SODIUM: 138 mmol/L (ref 135–146)

## 2015-08-04 LAB — CBC WITH DIFFERENTIAL/PLATELET
BASOS PCT: 0 % (ref 0–1)
Basophils Absolute: 0 10*3/uL (ref 0.0–0.1)
Eosinophils Absolute: 0.3 10*3/uL (ref 0.0–0.7)
Eosinophils Relative: 6 % — ABNORMAL HIGH (ref 0–5)
HEMATOCRIT: 39 % (ref 36.0–46.0)
HEMOGLOBIN: 12.8 g/dL (ref 12.0–15.0)
LYMPHS PCT: 42 % (ref 12–46)
Lymphs Abs: 2 10*3/uL (ref 0.7–4.0)
MCH: 29.6 pg (ref 26.0–34.0)
MCHC: 32.8 g/dL (ref 30.0–36.0)
MCV: 90.3 fL (ref 78.0–100.0)
MONO ABS: 0.6 10*3/uL (ref 0.1–1.0)
MONOS PCT: 12 % (ref 3–12)
MPV: 9 fL (ref 8.6–12.4)
NEUTROS ABS: 1.9 10*3/uL (ref 1.7–7.7)
NEUTROS PCT: 40 % — AB (ref 43–77)
Platelets: 404 10*3/uL — ABNORMAL HIGH (ref 150–400)
RBC: 4.32 MIL/uL (ref 3.87–5.11)
RDW: 15.3 % (ref 11.5–15.5)
WBC: 4.7 10*3/uL (ref 4.0–10.5)

## 2015-08-04 LAB — MAGNESIUM: Magnesium: 2 mg/dL (ref 1.5–2.5)

## 2015-08-04 LAB — LIPID PANEL
Cholesterol: 197 mg/dL (ref 125–200)
HDL: 73 mg/dL (ref >=46)
LDL CALC: 108 mg/dL (ref <130)
Total CHOL/HDL Ratio: 2.7 Ratio (ref <=5.0)
Triglycerides: 80 mg/dL (ref <150)
VLDL: 16 mg/dL (ref <30)

## 2015-08-04 NOTE — Patient Instructions (Signed)

## 2015-08-04 NOTE — Progress Notes (Signed)
Patient ID: Catherine Parks, female   DOB: 26-Mar-1941, 74 y.o.   MRN: [id number]   Annual Screening/Preventative Comprehensive Examination    This very nice 74 y.o. WBF presents for presents for a Wellness/Preventative Visit & comprehensive evaluation and management of multiple medical co-morbidities.  Patient has been followed for HTN, Prediabetes, Hyperlipidemia and Vitamin D Deficiency.      HTN predates since 24 at age 74 yo. Patient's BP has been controlled at home and patient denies any cardiac symptoms as chest pain, palpitations, shortness of breath, dizziness or ankle swelling. Today's BP: 116/80 mmHg      Patient's hyperlipidemia is controlled with diet. Patient denies myalgias or other medication SE's. Last lipids were near goal with Cholesterol 204*; HDL 85; LDL 102; Triglycerides 83 on 04/10/2015.     Patient has prediabetes predating since  Aug 2104 with A1c 6.0% and patient denies reactive hypoglycemic symptoms, visual blurring, diabetic polys, or paresthesias. Last A1c was still  6.0% on 04/10/2015.     Finally, patient has history of Vitamin D Deficiency of 18 in 2008 and last Vitamin D was  66 on 04/10/2015.  Medication Sig  . amLODipine 10 MG tablet TAKE 1 TABLET BY MOUTH EVERY DAY FOR BLOOD PRESSURE  . aspirin EC 81 MG  Take 81 mg by mouth 2 (two) times a week.    Marland Kitchen VITAMIN D 2000 UNITS  Take 2,000 Units by mouth daily.   . cloNIDine (0.2 MG tablet TAKE 1 TABLET (0.2 MG TOTAL) BY MOUTH 2 (TWO) TIMES DAILY.  Marland Kitchen FLAX SEED OIL 1000 MG  Take by mouth daily.  Lebron Quam 5-325  Take 1-2 tablets by mouth every 6 (six) hours as needed for moderate pain.  Marland Kitchen Omeprazole 40 MG capsule Take 40 mg by mouth daily.  Marland Kitchen triamterene-hctz (MAXZIDE-25) 37.5-25  TAKE 1 TABLET BY MOUTH DAILY.   Allergies  Allergen Reactions  . Lemon Oil   . Shellfish Allergy Swelling   Past Medical History  Diagnosis Date  . Hypertension   . HSV-1 (herpes simplex virus 1) infection   . Diverticulosis   .  Colon polyps    Health Maintenance  Topic Date Due  . ZOSTAVAX  10/26/2000  . PNA vac Low Risk Adult (2 of 2 - PCV13) 05/11/2014  . INFLUENZA VACCINE  04/17/2015  . MAMMOGRAM  03/13/2017  . COLONOSCOPY  09/16/2020  . TETANUS/TDAP  05/07/2022  . DEXA SCAN  Completed   Immunization History  Administered Date(s) Administered  . DTaP 07/30/2011  . Pneumococcal Polysaccharide-23 05/11/2013  . Pneumococcal-Unspecified 01/07/2002  . Td 01/07/2002, 05/07/2012   Past Surgical History  Procedure Laterality Date  . Eye surgery      cataracts  . Abdominal hysterectomy    . Colonoscopy    . Direct laryngoscopy N/A 10/21/2014    Procedure: DIRECT LARYNGOSCOPY;  Surgeon: Rozetta Nunnery, MD;  Location: [facility];  Service: ENT;  Laterality: N/A;  . Mass excision Right 10/21/2014    Procedure: RIGHT NECK NODE BIOPSY;  Surgeon: Rozetta Nunnery, MD;  Location: [facility];  Service: ENT;  Laterality: Right;  . Excision nasal mass Right 10/21/2014    Procedure: EXCISION NASOPHARYNGEAL MASS;  Surgeon: Rozetta Nunnery, MD;  Location: [place];  Service: ENT;  Laterality: Right;   Family History  Problem Relation Age of Onset  . Hypertension Mother   . Stroke Mother   . Heart attack Father    Social History  Substance Use Topics  . Smoking status: Former Smoker -- 50 years    Types: Cigarettes    Quit date: 09/23/2014  . Smokeless tobacco: Former Systems developer    Quit date: 09/16/2014     Comment: quit 11 months ago  . Alcohol Use: No    ROS Constitutional: Denies fever, chills, weight loss/gain, headaches, insomnia,  night sweats, and change in appetite. Does c/o fatigue. Eyes: Denies redness, blurred vision, diplopia, discharge, itchy, watery eyes.  ENT: Denies discharge, congestion, post nasal drip, epistaxis, sore throat, earache, hearing loss, dental pain, Tinnitus, Vertigo, Sinus pain, snoring.  Cardio: Denies chest pain,  palpitations, irregular heartbeat, syncope, dyspnea, diaphoresis, orthopnea, PND, claudication, edema Respiratory: denies cough, dyspnea, DOE, pleurisy, hoarseness, laryngitis, wheezing.  Gastrointestinal: Denies dysphagia, heartburn, reflux, water brash, pain, cramps, nausea, vomiting, bloating, diarrhea, constipation, hematemesis, melena, hematochezia, jaundice, hemorrhoids Genitourinary: Denies dysuria, frequency, urgency, nocturia, hesitancy, discharge, hematuria, flank pain Breast: Breast lumps, nipple discharge, bleeding.  Musculoskeletal: Denies arthralgia, myalgia, stiffness, Jt. Swelling, pain, limp, and strain/sprain. Denies falls. Skin: Denies puritis, rash, hives, warts, acne, eczema, changing in skin lesion Neuro: No weakness, tremor, incoordination, spasms, paresthesia, pain Psychiatric: Denies confusion, memory loss, sensory loss. Denies Depression. Endocrine: Denies change in weight, skin, hair change, nocturia, and paresthesia, diabetic polys, visual blurring, hyper / hypo glycemic episodes.  Heme/Lymph: No excessive bleeding, bruising, enlarged lymph nodes.  Physical Exam  BP 116/80 mmHg  Pulse 56  Temp(Src) 97.5 F (36.4 C)  Resp 16  Ht 5' 7.25" (1.708 m)  Wt 149 lb (67.586 kg)  BMI 23.17 kg/m2  General Appearance: Well nourished and in no apparent distress. Eyes: PERRLA, EOMs, conjunctiva no swelling or erythema, normal fundi and vessels. Sinuses: No frontal/maxillary tenderness ENT/Mouth: EACs patent / TMs  nl. Nares clear without erythema, swelling, mucoid exudates. Oral hygiene is good. No erythema, swelling, or exudate. Tongue normal, non-obstructing. Tonsils not swollen or erythematous. Hearing normal.  Neck: Supple, thyroid normal. No bruits, nodes or JVD. Respiratory: Respiratory effort normal.  BS equal and clear bilateral without rales, rhonci, wheezing or stridor. Cardio: Heart sounds are normal with regular rate and rhythm and no murmurs, rubs or gallops.  Peripheral pulses are normal and equal bilaterally without edema. No aortic or femoral bruits. Chest: symmetric with normal excursions and percussion. Breasts: Symmetric, without lumps, nipple discharge, retractions, or fibrocystic changes.  Abdomen: Flat, soft, with bowl sounds. Nontender, no guarding, rebound, hernias, masses, or organomegaly.  Lymphatics: Non tender without lymphadenopathy.  Genitourinary:  Musculoskeletal: Full ROM all peripheral extremities, joint stability, 5/5 strength, and normal gait. Skin: Warm and dry without rashes, lesions, cyanosis, clubbing or  ecchymosis.  Neuro: Cranial nerves intact, reflexes equal bilaterally. Normal muscle tone, no cerebellar symptoms. Sensation intact.  Pysch: Awake and oriented X 3, normal affect, Insight and Judgment appropriate.   Assessment and Plan  1. Annual Preventative Screening Examination   2. Essential hypertension  - Microalbumin / creatinine urine ratio - EKG 12-Lead - [place], RETROPERITNL ABD,  LTD - TSH  3. Hyperlipidemia  - Lipid panel - TSH  4. Prediabetes  - Hemoglobin A1c - Insulin, random  5. Vitamin D deficiency  - VITAMIN D 25 Hydroxy   6. Gastroesophageal reflux disease   7. CKD Stage II (GFR 65 ml/min)   8. Body mass index (BMI) of 22.0-22.9 in adult   9. Screening for rectal cancer  - POC Hemoccult Bld/Stl   10. Depression screen   11. At low risk for fall   12.  Medication management  - Urinalysis, Routine w reflex microscopic  - CBC with Differential/Platelet - BASIC METABOLIC PANEL WITH GFR - Hepatic function panel - Magnesium   Continue prudent diet as discussed, weight control, BP monitoring, regular exercise, and medications. Discussed med's effects and SE's. Screening labs and tests as requested with regular follow-up as recommended.

## 2015-08-05 LAB — URINALYSIS, ROUTINE W REFLEX MICROSCOPIC
Bilirubin Urine: NEGATIVE
GLUCOSE, UA: NEGATIVE
Hgb urine dipstick: NEGATIVE
Ketones, ur: NEGATIVE
Leukocytes, UA: NEGATIVE
Nitrite: NEGATIVE
Protein, ur: NEGATIVE
SPECIFIC GRAVITY, URINE: 1.015 (ref 1.001–1.035)
pH: 6.5 (ref 5.0–8.0)

## 2015-08-05 LAB — INSULIN, RANDOM: Insulin: 8.6 u[IU]/mL (ref 2.0–19.6)

## 2015-08-05 LAB — MICROALBUMIN / CREATININE URINE RATIO
Creatinine, Urine: 87 mg/dL (ref 20–320)
Microalb Creat Ratio: 3 mcg/mg creat (ref <30)
Microalb, Ur: 0.3 mg/dL

## 2015-08-05 LAB — HEMOGLOBIN A1C
HEMOGLOBIN A1C: 5.9 % — AB (ref <5.7)
MEAN PLASMA GLUCOSE: 123 mg/dL — AB (ref <117)

## 2015-08-05 LAB — TSH: TSH: 1.213 u[IU]/mL (ref 0.350–4.500)

## 2015-08-05 LAB — VITAMIN D 25 HYDROXY (VIT D DEFICIENCY, FRACTURES): Vit D, 25-Hydroxy: 63 ng/mL (ref 30–100)

## 2015-08-06 ENCOUNTER — Encounter: Payer: Self-pay | Admitting: Internal Medicine

## 2015-08-06 DIAGNOSIS — Z0000 Encounter for general adult medical examination without abnormal findings: Secondary | ICD-10-CM | POA: Insufficient documentation

## 2015-09-05 ENCOUNTER — Other Ambulatory Visit: Payer: Self-pay | Admitting: *Deleted

## 2015-09-05 DIAGNOSIS — Z1212 Encounter for screening for malignant neoplasm of rectum: Secondary | ICD-10-CM

## 2015-09-05 LAB — POC HEMOCCULT BLD/STL (HOME/3-CARD/SCREEN)
Card #2 Fecal Occult Blod, POC: NEGATIVE
Card #3 Fecal Occult Blood, POC: NEGATIVE
FECAL OCCULT BLD: NEGATIVE

## 2015-09-08 ENCOUNTER — Other Ambulatory Visit: Payer: Self-pay | Admitting: Internal Medicine

## 2015-09-08 DIAGNOSIS — R600 Localized edema: Secondary | ICD-10-CM

## 2015-09-08 DIAGNOSIS — I10 Essential (primary) hypertension: Secondary | ICD-10-CM

## 2015-09-11 ENCOUNTER — Encounter: Payer: Self-pay | Admitting: *Deleted

## 2015-10-21 ENCOUNTER — Other Ambulatory Visit: Payer: Self-pay | Admitting: Internal Medicine

## 2015-11-19 ENCOUNTER — Encounter (HOSPITAL_COMMUNITY): Payer: Self-pay | Admitting: Emergency Medicine

## 2015-11-19 ENCOUNTER — Emergency Department (HOSPITAL_COMMUNITY)
Admission: EM | Admit: 2015-11-19 | Discharge: 2015-11-19 | Disposition: A | Discharge status: Home / Self Care | Payer: Medicare Other | Attending: Emergency Medicine | Admitting: Emergency Medicine

## 2015-11-19 DIAGNOSIS — I10 Essential (primary) hypertension: Secondary | ICD-10-CM | POA: Insufficient documentation

## 2015-11-19 DIAGNOSIS — Z79899 Other long term (current) drug therapy: Secondary | ICD-10-CM | POA: Diagnosis not present

## 2015-11-19 DIAGNOSIS — Z8619 Personal history of other infectious and parasitic diseases: Secondary | ICD-10-CM | POA: Diagnosis not present

## 2015-11-19 DIAGNOSIS — R231 Pallor: Secondary | ICD-10-CM | POA: Diagnosis not present

## 2015-11-19 DIAGNOSIS — Z87891 Personal history of nicotine dependence: Secondary | ICD-10-CM | POA: Diagnosis not present

## 2015-11-19 DIAGNOSIS — R55 Syncope and collapse: Secondary | ICD-10-CM

## 2015-11-19 DIAGNOSIS — Z7982 Long term (current) use of aspirin: Secondary | ICD-10-CM | POA: Diagnosis not present

## 2015-11-19 DIAGNOSIS — J302 Other seasonal allergic rhinitis: Secondary | ICD-10-CM | POA: Insufficient documentation

## 2015-11-19 DIAGNOSIS — R110 Nausea: Secondary | ICD-10-CM | POA: Diagnosis not present

## 2015-11-19 DIAGNOSIS — K219 Gastro-esophageal reflux disease without esophagitis: Secondary | ICD-10-CM | POA: Insufficient documentation

## 2015-11-19 DIAGNOSIS — G478 Other sleep disorders: Secondary | ICD-10-CM | POA: Insufficient documentation

## 2015-11-19 DIAGNOSIS — Z86010 Personal history of colonic polyps: Secondary | ICD-10-CM | POA: Diagnosis not present

## 2015-11-19 LAB — I-STAT TROPONIN, ED: TROPONIN I, POC: 0.01 ng/mL (ref 0.00–0.08)

## 2015-11-19 LAB — CBC WITH DIFFERENTIAL/PLATELET
BASOS ABS: 0 10*3/uL (ref 0.0–0.1)
BASOS PCT: 0 %
EOS ABS: 0.1 10*3/uL (ref 0.0–0.7)
Eosinophils Relative: 2 %
HCT: 37.9 % (ref 36.0–46.0)
HEMOGLOBIN: 12.7 g/dL (ref 12.0–15.0)
Lymphocytes Relative: 29 %
Lymphs Abs: 2.1 10*3/uL (ref 0.7–4.0)
MCH: 29.3 pg (ref 26.0–34.0)
MCHC: 33.5 g/dL (ref 30.0–36.0)
MCV: 87.5 fL (ref 78.0–100.0)
MONOS PCT: 6 %
Monocytes Absolute: 0.4 10*3/uL (ref 0.1–1.0)
NEUTROS PCT: 63 %
Neutro Abs: 4.4 10*3/uL (ref 1.7–7.7)
PLATELETS: 314 10*3/uL (ref 150–400)
RBC: 4.33 MIL/uL (ref 3.87–5.11)
RDW: 15.2 % (ref 11.5–15.5)
WBC: 7 10*3/uL (ref 4.0–10.5)

## 2015-11-19 LAB — BASIC METABOLIC PANEL
Anion gap: 14 (ref 5–15)
BUN: 15 mg/dL (ref 6–20)
CALCIUM: 10.2 mg/dL (ref 8.9–10.3)
CHLORIDE: 102 mmol/L (ref 101–111)
CO2: 24 mmol/L (ref 22–32)
CREATININE: 0.91 mg/dL (ref 0.44–1.00)
Glucose, Bld: 96 mg/dL (ref 65–99)
Potassium: 3.5 mmol/L (ref 3.5–5.1)
SODIUM: 140 mmol/L (ref 135–145)

## 2015-11-19 LAB — CBG MONITORING, ED: GLUCOSE-CAPILLARY: 83 mg/dL (ref 65–99)

## 2015-11-19 NOTE — ED Provider Notes (Signed)
CSN: [id number]     Arrival date & time 11/19/15  1300 History   First MD Initiated Contact with Patient 11/19/15 1308     Chief Complaint  Patient presents with  . Loss of Consciousness     (Consider location/radiation/quality/duration/timing/severity/associated sxs/prior Treatment) HPI   Catherine Parks is a 75 year old woman with a PMH of HTN, GERD who presents with an episode of lost consciousness. This event occurred while she was at church. She remembers going to church, singing a solo well, receiving applause, returning to her seat, and continuing to sing. She does recall feeling nauseous. Her next memory is attempting be revived, profusely sweating, and feeling hot. Her sister was present to corroborate the story, who witnessed the event. She saw her sister singing while seated and noticed that she slumped over. The organist encouraged her to to help revive her sister. Her sister indicated the patient felt warm, sweaty and was unarousable. The entire episode lasted approximately 5 minutes. She was reportedly confused after the event, but was speaking appropriately. She denies any dyspnea or chest pain. She did not hit her head and denies any injuries  She tells me this is the 2nd anniversary of her husbands death, and she was feeling upset because one her deceased husbands old friends did not know that he had passed, and he tried to discuss her husband with her at the store. She said she was also somewhat upset by the [facility] game. She denies any prodromal symptoms - no vertigo, light-headedness or palpitations. She denies any recent medication changes. For her HTN, she takes clonidine 0.2 mg BID, triamterene-HCTZ, and amlodipine 10 mg daily.   She reports a similar episode happening in the kitchen 6 years ago shortly after the death of her mother.   Past Medical History  Diagnosis Date  . Hypertension   . HSV-1 (herpes simplex virus 1) infection   . Diverticulosis   . Colon polyps     Past Surgical History  Procedure Laterality Date  . Eye surgery      cataracts  . Abdominal hysterectomy    . Colonoscopy    . Direct laryngoscopy N/A 10/21/2014    Procedure: DIRECT LARYNGOSCOPY;  Surgeon: Rozetta Nunnery, MD;  Location: [place];  Service: ENT;  Laterality: N/A;  . Mass excision Right 10/21/2014    Procedure: RIGHT NECK NODE BIOPSY;  Surgeon: Rozetta Nunnery, MD;  Location: [place];  Service: ENT;  Laterality: Right;  . Excision nasal mass Right 10/21/2014    Procedure: EXCISION NASOPHARYNGEAL MASS;  Surgeon: Rozetta Nunnery, MD;  Location: [place];  Service: ENT;  Laterality: Right;   Family History  Problem Relation Age of Onset  . Hypertension Mother   . Stroke Mother   . Heart attack Father    Social History  Substance Use Topics  . Smoking status: Former Smoker -- 50 years    Types: Cigarettes    Quit date: 09/23/2014  . Smokeless tobacco: Former Systems developer    Quit date: 09/16/2014     Comment: quit 11 months ago  . Alcohol Use: No   OB History    No data available     Review of Systems  Constitutional: Negative for fever and chills.  HENT: Negative for congestion, sore throat and tinnitus.   Eyes: Negative for photophobia and visual disturbance.  Respiratory: Negative for cough, shortness of breath and wheezing.   Cardiovascular: Negative for chest pain, palpitations  and leg swelling.  Gastrointestinal: Positive for nausea. Negative for abdominal pain, diarrhea and blood in stool.  Endocrine: Negative for cold intolerance and heat intolerance.  Genitourinary: Negative for dysuria and urgency.  Musculoskeletal: Negative for back pain and arthralgias.  Skin: Positive for pallor. Negative for rash.  Allergic/Immunologic: Positive for environmental allergies.  Neurological: Positive for syncope. Negative for dizziness, tremors, weakness, light-headedness and headaches.   Psychiatric/Behavioral: Positive for sleep disturbance. Negative for dysphoric mood. The patient is not nervous/anxious.       Allergies  Lemon oil and Shellfish allergy  Home Medications   Prior to Admission medications   Medication Sig Start Date End Date Taking? Authorizing Provider  acetaminophen (TYLENOL) 500 MG tablet Take 500 mg by mouth every 6 (six) hours as needed (pain).   Yes Historical Provider, MD  amLODipine (NORVASC) 10 MG tablet TAKE 1 TABLET BY MOUTH EVERY DAY FOR BLOOD PRESSURE 05/15/15  Yes Unk Pinto, MD  aspirin EC 81 MG tablet Take 81 mg by mouth 2 (two) times a week.     Yes Historical Provider, MD  cholecalciferol (VITAMIN D) 1000 units tablet Take 4,000 Units by mouth daily after lunch.   Yes Historical Provider, MD  cloNIDine (CATAPRES) 0.2 MG tablet Take 1 tablet 2 x day for BP Patient taking differently: Take 0.2 mg by mouth 2 (two) times daily. For blood pressure 10/22/15  Yes Unk Pinto, MD  Flaxseed, Linseed, (FLAX SEED OIL) 1000 MG CAPS Take 1,000 mg by mouth daily after lunch.    Yes Historical Provider, MD  Naphazoline HCl (CLEAR EYES OP) Place 1 drop into both eyes daily.   Yes Historical Provider, MD  omeprazole (PRILOSEC) 40 MG capsule Take 40 mg by mouth daily as needed (heartburn / acid reflux).    Yes Historical Provider, MD  Psyllium (METAMUCIL PO) Take 5 mLs by mouth daily after lunch. Mix in 8 oz liquid and drink   Yes Historical Provider, MD  triamterene-hydrochlorothiazide (MAXZIDE-25) 37.5-25 MG tablet TAKE 1 TABLET BY MOUTH DAILY. 09/08/15  Yes Unk Pinto, MD   BP 138/75 mmHg  Pulse 87  Temp(Src) 98.7 F (37.1 C) (Oral)  Resp 23  SpO2 97% Physical Exam  ED Course  Procedures (including critical care time) Labs Review Labs Reviewed  BASIC METABOLIC PANEL  CBC WITH DIFFERENTIAL/PLATELET  CBG MONITORING, ED  I-STAT TROPOININ, ED    Imaging Review No results found. I have personally reviewed and evaluated these  images and lab results as part of my medical decision-making.   EKG Interpretation   Date/Time:  Sunday November 19 2015 13:06:57 EST Ventricular Rate:  76 PR Interval:  182 QRS Duration: 98 QT Interval:  514 QTC Calculation: 578 R Axis:   1 Text Interpretation:  Sinus rhythm Low voltage, precordial leads  Anteroseptal infarct, old Borderline T abnormalities, inferior leads  Prolonged QT interval No old tracing to compare Confirmed by [facility]  MD,  ELLIOTT [phone]) on 11/19/2015 1:37:25 PM      Orthostatic VS for the past 24 hrs:  BP- Lying Pulse- Lying BP- Sitting Pulse- Sitting BP- Standing at 0 minutes Pulse- Standing at 0 minutes  11/19/15 1437 138/75 mmHg 104 144/74 mmHg 92 146/88 mmHg 107    CBC Latest Ref Rng 11/19/2015 08/04/2015 04/10/2015  WBC 4.0 - 10.5 K/uL 7.0 4.7 4.2  Hemoglobin 12.0 - 15.0 g/dL 12.7 12.8 11.9(L)  Hematocrit 36.0 - 46.0 % 37.9 39.0 35.6(L)  Platelets 150 - 400 K/uL 314 404(H) 361   BMP  Latest Ref Rng 11/19/2015 08/04/2015 04/10/2015  Glucose 65 - 99 mg/dL 96 94 98  BUN 6 - 20 mg/dL 15 17 14   Creatinine 0.44 - 1.00 mg/dL 0.91 0.90 0.97(H)  Sodium 135 - 145 mmol/L 140 138 138  Potassium 3.5 - 5.1 mmol/L 3.5 4.1 3.6  Chloride 101 - 111 mmol/L 102 101 102  CO2 22 - 32 mmol/L 24 26 27   Calcium 8.9 - 10.3 mg/dL 10.2 10.7(H) 10.6(H)     MDM   Final diagnoses:  None   Given emotional stressors in light of the anniversary of the death of her husband with the activity, and previous episode of likely vaso-vagal syncope from the death of her mother, it is likely her most recent episode is explained by vaso-vagal syncope. Another possibility/contributor is her extensive anti-hypertensive regimen including clonidine. Her orthostatic vital signs are negative. Her EKG shows non-specific ST changes without evidence of ischemia, some premature atrial beats, and mildly prolonged QTc to 578, likely not significant enough to cause her present symptoms. Lack of palpitations  prior to the event also makes cardiogenic syncope less likely. There were no electrolyte abnormalities.  I explained to the patient that her symptoms were most likely due to vaso-vagal syncope. Instructions were provided and she was discharged in good condition.     Liberty Handy, MD 11/19/15 [address], MD 11/19/15 [phone]

## 2015-11-19 NOTE — ED Provider Notes (Signed)
  Face-to-face evaluation   History: Patient sitting in church, sat down and passed out. She was kept in a seated position, and was not laid down, but ultimately woke up in about 5 minutes. She does remember a slight prodrome of nausea, but no other symptoms. She feels back to normal now. No recent similar episode. She is taking usual medications. She's been somewhat distraught since yesterday because it was the anniversary of her husband's death.  Physical exam: Alert, elderly female who is comfortable. No dysarthria, aphasia or nystagmus. No respiratory distress.  Medical screening examination/treatment/procedure(s) were conducted as a shared visit with resident physician and myself.  I personally evaluated the patient during the encounter  Daleen Bo, MD 11/19/15 [phone]

## 2015-11-19 NOTE — ED Notes (Signed)
Per EMS- pt was singing in church when she got really hot. She sat down in her chair and felt nauseated. Pt then lost consciousness for about a minute. Pt denies injury to head. Pt alert and oriented presently. EKG was normal, CBG normal and vital signs all within normal.

## 2015-11-19 NOTE — Discharge Instructions (Signed)
[name], it was a pleasure taking care of you. You most likely had an episode of Vasovagal Syncope, which can be caused by emotional distress. Instructions and description of this disorder attached.

## 2015-11-22 ENCOUNTER — Ambulatory Visit (INDEPENDENT_AMBULATORY_CARE_PROVIDER_SITE_OTHER): Payer: Medicare Other | Admitting: Internal Medicine

## 2015-11-22 ENCOUNTER — Encounter: Payer: Self-pay | Admitting: Internal Medicine

## 2015-11-22 VITALS — BP 126/64 | HR 62 | Temp 98.0°F | Resp 16 | Ht 67.25 in | Wt 150.0 lb

## 2015-11-22 DIAGNOSIS — Z23 Encounter for immunization: Secondary | ICD-10-CM | POA: Diagnosis not present

## 2015-11-22 DIAGNOSIS — F17200 Nicotine dependence, unspecified, uncomplicated: Secondary | ICD-10-CM

## 2015-11-22 DIAGNOSIS — R6889 Other general symptoms and signs: Secondary | ICD-10-CM

## 2015-11-22 DIAGNOSIS — I10 Essential (primary) hypertension: Secondary | ICD-10-CM

## 2015-11-22 DIAGNOSIS — Z79899 Other long term (current) drug therapy: Secondary | ICD-10-CM | POA: Diagnosis not present

## 2015-11-22 DIAGNOSIS — K635 Polyp of colon: Secondary | ICD-10-CM | POA: Diagnosis not present

## 2015-11-22 DIAGNOSIS — R7303 Prediabetes: Secondary | ICD-10-CM

## 2015-11-22 DIAGNOSIS — B009 Herpesviral infection, unspecified: Secondary | ICD-10-CM | POA: Diagnosis not present

## 2015-11-22 DIAGNOSIS — K219 Gastro-esophageal reflux disease without esophagitis: Secondary | ICD-10-CM

## 2015-11-22 DIAGNOSIS — Z6822 Body mass index (BMI) 22.0-22.9, adult: Secondary | ICD-10-CM

## 2015-11-22 DIAGNOSIS — E782 Mixed hyperlipidemia: Secondary | ICD-10-CM | POA: Diagnosis not present

## 2015-11-22 DIAGNOSIS — E559 Vitamin D deficiency, unspecified: Secondary | ICD-10-CM

## 2015-11-22 DIAGNOSIS — N182 Chronic kidney disease, stage 2 (mild): Secondary | ICD-10-CM

## 2015-11-22 DIAGNOSIS — K5791 Diverticulosis of intestine, part unspecified, without perforation or abscess with bleeding: Secondary | ICD-10-CM

## 2015-11-22 DIAGNOSIS — Z0001 Encounter for general adult medical examination with abnormal findings: Secondary | ICD-10-CM | POA: Diagnosis not present

## 2015-11-22 DIAGNOSIS — Z0000 Encounter for general adult medical examination without abnormal findings: Secondary | ICD-10-CM

## 2015-11-22 MED ORDER — HALOBETASOL PROPIONATE 0.05 % EX CREA: TOPICAL_CREAM | Freq: Two times a day (BID) | CUTANEOUS | Status: DC

## 2015-11-22 NOTE — Progress Notes (Signed)
MEDICARE ANNUAL WELLNESS VISIT AND FOLLOW UP  Assessment:    1. Essential hypertension -cont meds -monitor at home -dash diet -discussed vasovagal syncope -patient currently declined seeing cardiology  2. Hyperlipidemia -cont diet and exercise -cont meds  3. Prediabetes -cont diet and exercise  4. Vitamin D deficiency -cont supplement  5. Medication management -monitor labs   6. Need for prophylactic vaccination against Streptococcus pneumoniae (pneumococcus)  - Pneumococcal conjugate vaccine 13-valent  7. Diverticulosis of intestine with bleeding, unspecified intestinal tract location -monitor on colonoscopy  8. Colon polyps -cont to monitor with colonoscopy  9. Gastroesophageal reflux disease, esophagitis presence not specified -zantac x 14 days  10. CKD Stage II (GFR 65 ml/min) -drink plenty fluids -avoid nsaids  11. HSV-1 (herpes simplex virus 1) infection -antivirals prn  12. Tobacco use disorder -reports quitting smoking  13. Body mass index (BMI) of 22.0-22.9 in adult -cont healthy well balanced diet  14. Medicare annual wellness visit, subsequent     Over 30 minutes of exam, counseling, chart review, and critical decision making was performed  Plan:   During the course of the visit the patient was educated and counseled about appropriate screening and preventive services including:    Pneumococcal vaccine   Influenza vaccine  Td vaccine  Prevnar 13  Screening electrocardiogram  Screening mammography  Bone densitometry screening  Colorectal cancer screening  Diabetes screening  Glaucoma screening  Nutrition counseling   Advanced directives: given info/requested copies  Conditions/risks identified: Diabetes is at goal, ACE/ARB therapy: Yes. Urinary Incontinence is not an issue: discussed non pharmacology and pharmacology options.  Fall risk: low- discussed PT, home fall assessment, medications.    Subjective:    Catherine Parks is a 75 y.o. female who presents for Medicare Annual Wellness Visit and 3 month follow up on hypertension, prediabetes, hyperlipidemia, vitamin D def.  Date of last medicare wellness visit is unknown.   Her blood pressure has been controlled at home, today their BP is BP: 126/64 mmHg She does not workout. She denies chest pain, shortness of breath, dizziness.  She reports that she did fall one time and now she is very aprehensive.  She reports that she fell because her shoe got stuck and then she tripped.     She is on cholesterol medication and denies myalgias. Her cholesterol is at goal. The cholesterol last visit was:   Lab Results  Component Value Date   CHOL 197 08/04/2015   HDL 73 08/04/2015   LDLCALC 108 08/04/2015   TRIG 80 08/04/2015   CHOLHDL 2.7 08/04/2015   She has been working on diet and exercise for prediabetes, and denies foot ulcerations, hyperglycemia, hypoglycemia , increased appetite, nausea, paresthesia of the feet, polydipsia, polyuria, visual disturbances, vomiting and weight loss. Last A1C in the office was:  Lab Results  Component Value Date   HGBA1C 5.9* 08/04/2015   Last GFR NonAA   Lab Results  Component Value Date   GFRNONAA >60 11/19/2015   AA  Lab Results  Component Value Date   GFRAA >60 11/19/2015   Patient is on Vitamin D supplement. Lab Results  Component Value Date   VD25OH 43 08/04/2015     Patient reports that she did have a syncopal episode that happened in church last week.  She reports that she stood up to sing and that she sat back down due to nausea and then ended up being unresponsive for 5 minutes.  She reports that she did go to the ER.  She reports that she was really hot during the earlier hymns.  She has had a vasovagal syncope spell in the past.    Medication Review Current Outpatient Prescriptions on File Prior to Visit  Medication Sig Dispense Refill  . acetaminophen (TYLENOL) 500 MG tablet Take 500 mg  by mouth every 6 (six) hours as needed (pain).    Marland Kitchen amLODipine (NORVASC) 10 MG tablet TAKE 1 TABLET BY MOUTH EVERY DAY FOR BLOOD PRESSURE 90 tablet 3  . aspirin EC 81 MG tablet Take 81 mg by mouth 2 (two) times a week.      . cholecalciferol (VITAMIN D) 1000 units tablet Take 4,000 Units by mouth daily after lunch.    . cloNIDine (CATAPRES) 0.2 MG tablet Take 1 tablet 2 x day for BP (Patient taking differently: Take 0.2 mg by mouth 2 (two) times daily. For blood pressure) 180 tablet 1  . Flaxseed, Linseed, (FLAX SEED OIL) 1000 MG CAPS Take 1,000 mg by mouth daily after lunch.     . Naphazoline HCl (CLEAR EYES OP) Place 1 drop into both eyes daily.    Marland Kitchen omeprazole (PRILOSEC) 40 MG capsule Take 40 mg by mouth daily as needed (heartburn / acid reflux).     . Psyllium (METAMUCIL PO) Take 5 mLs by mouth daily after lunch. Mix in 8 oz liquid and drink    . triamterene-hydrochlorothiazide (MAXZIDE-25) 37.5-25 MG tablet TAKE 1 TABLET BY MOUTH DAILY. 90 tablet 1   No current facility-administered medications on file prior to visit.    Current Problems (verified) Patient Active Problem List   Diagnosis Date Noted  . Medicare annual wellness visit, subsequent 08/06/2015  . Body mass index (BMI) of 22.0-22.9 in adult 08/04/2015  . GERD  01/05/2015  . Tobacco use disorder 09/27/2014  . CKD Stage II (GFR 65 ml/min) 06/08/2014  . Medication management 06/08/2014  . Hyperlipidemia 08/19/2013  . Prediabetes 08/19/2013  . Vitamin D deficiency 08/19/2013  . Hypertension   . HSV-1 (herpes simplex virus 1) infection   . Diverticulosis   . Colon polyps     Screening Tests Immunization History  Administered Date(s) Administered  . DTaP 07/30/2011  . Pneumococcal Conjugate-13 11/22/2015  . Pneumococcal Polysaccharide-23 05/11/2013  . Pneumococcal-Unspecified 01/07/2002  . Td 01/07/2002, 05/07/2012    Preventative care: Last colonoscopy: 2012 Last mammogram: 09/11/15 DEXA:Due will order  Prior  vaccinations: TD or Tdap: 2013  Influenza: Declined  Pneumococcal: 2014 Prevnar13: 2017 Shingles/Zostavax: Declined  Names of Other Physician/Practitioners you currently use: 1. [facility]- here for primary care 2. Does see yearly can't remember name, eye doctor, last visit  3. Dr. Desma Mcgregor , dentist, last visit 2017 Patient Care Team: Unk Pinto, MD as PCP - General (Internal Medicine) Warden Fillers, MD as Consulting Physician (Optometry) Sanda Klein, MD as Consulting Physician (Cardiology) Sable Feil, MD as Consulting Physician (Gastroenterology) Mickle Plumb, MD (Gynecology) Druscilla Brownie, MD as Consulting Physician (Dermatology) Volanda Napoleon, MD as Consulting Physician (Oncology) Rozetta Nunnery, MD as Consulting Physician (Otolaryngology)  Past Surgical History  Procedure Laterality Date  . Eye surgery      cataracts  . Abdominal hysterectomy    . Colonoscopy    . Direct laryngoscopy N/A 10/21/2014    Procedure: DIRECT LARYNGOSCOPY;  Surgeon: Rozetta Nunnery, MD;  Location: [facility];  Service: ENT;  Laterality: N/A;  . Mass excision Right 10/21/2014    Procedure: RIGHT NECK NODE BIOPSY;  Surgeon: Leonides Sake  Lucia Gaskins, MD;  Location: [facility];  Service: ENT;  Laterality: Right;  . Excision nasal mass Right 10/21/2014    Procedure: EXCISION NASOPHARYNGEAL MASS;  Surgeon: Rozetta Nunnery, MD;  Location: [facility];  Service: ENT;  Laterality: Right;   Family History  Problem Relation Age of Onset  . Hypertension Mother   . Stroke Mother   . Heart attack Father    Social History  Substance Use Topics  . Smoking status: Former Smoker -- 50 years    Types: Cigarettes    Quit date: 09/23/2014  . Smokeless tobacco: Former Systems developer    Quit date: 09/16/2014     Comment: quit 11 months ago  . Alcohol Use: No    MEDICARE WELLNESS OBJECTIVES: Tobacco use:  She does not smoke.  Patient is a former smoker. If yes, counseling given Alcohol Current alcohol use: social drinker Osteoporosis: postmenopausal estrogen deficiency, History of fracture in the past year: no Fall risk: Low Risk Hearing: normal Visual acuity: normal,  does perform annual eye exam Diet: well balanced Physical activity: Current Exercise Habits: Home exercise routine, Type of exercise: walking, Time (Minutes): 30, Frequency (Times/Week): 4, Weekly Exercise (Minutes/Week): 120 Cardiac risk factors: Cardiac Risk Factors include: dyslipidemia;family history of premature cardiovascular disease;hypertension;sedentary lifestyle Depression/mood screen:   Depression screen [facility] 2/9 11/22/2015  Decreased Interest 0  Down, Depressed, Hopeless 0  PHQ - 2 Score 0    ADLs:  In your present state of health, do you have any difficulty performing the following activities: 11/22/2015 08/04/2015  Hearing? N N  Vision? N N  Difficulty concentrating or making decisions? N N  Walking or climbing stairs? N N  Dressing or bathing? N N  Doing errands, shopping? N N  Preparing Food and eating ? N -  Using the Toilet? N -  In the past six months, have you accidently leaked urine? N -  Do you have problems with loss of bowel control? N -  Managing your Medications? N -  Managing your Finances? N -  Housekeeping or managing your Housekeeping? N -     Cognitive Testing  Alert? Yes  Normal Appearance?Yes  Oriented to person? Yes  Place? Yes   Time? Yes  Recall of three objects?  Yes  Can perform simple calculations? Yes  Displays appropriate judgment?Yes  Can read the correct time from a watch face?Yes  EOL planning: Does patient have an advance directive?: Yes Type of Advance Directive: Living will Does patient want to make changes to advanced directive?: No - Patient declined Copy of advanced directive(s) in chart?: Yes   Objective:   Today's Vitals   11/22/15 0848  BP: 126/64   Pulse: 62  Temp: 98 F (36.7 C)  TempSrc: Temporal  Resp: 16  Height: 5' 7.25" (1.708 m)  Weight: 150 lb (68.04 kg)   Body mass index is 23.32 kg/(m^2).  General appearance: alert, no distress, WD/WN,  female HEENT: normocephalic, sclerae anicteric, TMs pearly, nares patent, no discharge or erythema, pharynx normal Oral cavity: MMM, no lesions Neck: supple, no lymphadenopathy, no thyromegaly, no masses Heart: RRR, normal S1, S2, no murmurs Lungs: CTA bilaterally, no wheezes, rhonchi, or rales Abdomen: +bs, soft, non tender, non distended, no masses, no hepatomegaly, no splenomegaly Musculoskeletal: nontender, no swelling, no obvious deformity Extremities: no edema, no cyanosis, no clubbing Pulses: 2+ symmetric, upper and lower extremities, normal cap refill Neurological: alert, oriented x 3, CN2-12 intact, strength normal upper extremities and lower extremities,  sensation normal throughout, DTRs 2+ throughout, no cerebellar signs, gait normal Psychiatric: normal affect, behavior normal, pleasant  Breast: defer Gyn: defer Rectal: defer   Medicare Attestation I have personally reviewed: The patient's medical and social history Their use of alcohol, tobacco or illicit drugs Their current medications and supplements The patient's functional ability including ADLs,fall risks, home safety risks, cognitive, and hearing and visual impairment Diet and physical activities Evidence for depression or mood disorders  The patient's weight, height, BMI, and visual acuity have been recorded in the chart.  I have made referrals, counseling, and provided education to the patient based on review of the above and I have provided the patient with a written personalized care plan for preventive services.     [name], PA-C   11/22/2015

## 2016-01-23 ENCOUNTER — Ambulatory Visit (INDEPENDENT_AMBULATORY_CARE_PROVIDER_SITE_OTHER): Payer: Medicare Other | Admitting: Physician Assistant

## 2016-01-23 ENCOUNTER — Encounter: Payer: Self-pay | Admitting: Physician Assistant

## 2016-01-23 VITALS — BP 132/74 | HR 74 | Temp 97.4°F | Resp 16 | Ht 67.5 in | Wt 152.8 lb

## 2016-01-23 DIAGNOSIS — M25532 Pain in left wrist: Secondary | ICD-10-CM

## 2016-01-23 MED ORDER — MELOXICAM 7.5 MG PO TABS
7.5000 mg | ORAL_TABLET | Freq: Every day | ORAL | Status: DC
Start: 2016-01-23 — End: 2016-06-07

## 2016-01-23 NOTE — Progress Notes (Signed)
Subjective:    Patient ID: Catherine Parks, female    DOB: 20-Jul-1941, 75 y.o.   MRN: [id number]  HPI 75 y.o. left handed AAF presents with left wrist pain x several years but has gotten progressively worse x 1 month after picking up something from the store. She has had some pain left wrist with some weakness, worse on the computer x 1 hour, denies numbness/tingling. Has tried a brace from store but states made worse and caused swelling.   Patient does bowel and states she can not bowel at this time due to pain.   Blood pressure 132/74, pulse 74, temperature 97.4 F (36.3 C), temperature source Temporal, resp. rate 16, height 5' 7.5" (1.715 m), weight 152 lb 12.8 oz (69.31 kg), SpO2 96 %.  Past Medical History  Diagnosis Date  . Hypertension   . HSV-1 (herpes simplex virus 1) infection   . Diverticulosis   . Colon polyps    Current Outpatient Prescriptions on File Prior to Visit  Medication Sig Dispense Refill  . acetaminophen (TYLENOL) 500 MG tablet Take 500 mg by mouth every 6 (six) hours as needed (pain).    Marland Kitchen amLODipine (NORVASC) 10 MG tablet TAKE 1 TABLET BY MOUTH EVERY DAY FOR BLOOD PRESSURE 90 tablet 3  . aspirin EC 81 MG tablet Take 81 mg by mouth 2 (two) times a week.      . cholecalciferol (VITAMIN D) 1000 units tablet Take 4,000 Units by mouth daily after lunch.    . cloNIDine (CATAPRES) 0.2 MG tablet Take 1 tablet 2 x day for BP (Patient taking differently: Take 0.2 mg by mouth 2 (two) times daily. For blood pressure) 180 tablet 1  . Flaxseed, Linseed, (FLAX SEED OIL) 1000 MG CAPS Take 1,000 mg by mouth daily after lunch.     . halobetasol (ULTRAVATE) 0.05 % cream Apply topically 2 (two) times daily. 50 g 0  . Naphazoline HCl (CLEAR EYES OP) Place 1 drop into both eyes daily.    Marland Kitchen omeprazole (PRILOSEC) 40 MG capsule Take 40 mg by mouth daily as needed (heartburn / acid reflux).     . Psyllium (METAMUCIL PO) Take 5 mLs by mouth daily after lunch. Mix in 8 oz liquid and  drink    . triamterene-hydrochlorothiazide (MAXZIDE-25) 37.5-25 MG tablet TAKE 1 TABLET BY MOUTH DAILY. 90 tablet 1   No current facility-administered medications on file prior to visit.    Review of Systems  Constitutional: Negative for fever and chills.  HENT: Negative for congestion, ear pain and sore throat.   Respiratory: Negative for cough, shortness of breath and wheezing.   Cardiovascular: Negative for chest pain, palpitations and leg swelling.  Gastrointestinal: Negative for abdominal pain, diarrhea, constipation and blood in stool.  Genitourinary: Negative.   Musculoskeletal: Positive for arthralgias. Negative for myalgias, back pain, joint swelling, gait problem, neck pain and neck stiffness.  Skin: Negative.   Neurological: Negative for dizziness and headaches.  Psychiatric/Behavioral: The patient is not nervous/anxious.        Objective:   Physical Exam  Constitutional: She is oriented to person, place, and time. She appears well-developed and well-nourished.  Cardiovascular: Normal rate and regular rhythm.   Pulmonary/Chest: Effort normal and breath sounds normal.  Musculoskeletal:  left shoulder and elbow, normal ROM without pain.  Left wrist without obvious deformity, swelling. Negative snuff box tenderness, pain with flexion and extension of wrist, questionable + tinels but negative phalens.  Normal distal neurovascular exam.  Neurological: She  is alert and oriented to person, place, and time. She has normal reflexes. She displays normal reflexes. No cranial nerve deficit. She exhibits normal muscle tone.  Skin: Skin is warm and dry. No rash noted.       Assessment & Plan:  Left wrist pain Tendonitis versus OA versus carpal tunnel Wear brace at night, mobic 7.5mg  daily, ice, rest, and will refer to ortho for evaluation

## 2016-01-23 NOTE — Patient Instructions (Signed)
Wear brace at night Take mobic with food x 2 weeks Ice wrist for 15 mins  Wrist Pain There are many things that can cause wrist pain. Some common causes include:  An injury to the wrist area, such as a sprain, strain, or fracture.  Overuse of the joint.  A condition that causes increased pressure on a nerve in the wrist (carpal tunnel syndrome).  Wear and tear of the joints that occurs with aging (osteoarthritis).  A variety of other types of arthritis. Sometimes, the cause of wrist pain is not known. The pain often goes away when you follow your health care provider's instructions for relieving pain at home. If your wrist pain continues, tests may need to be done to diagnose your condition. HOME CARE INSTRUCTIONS Pay attention to any changes in your symptoms. Take these actions to help with your pain:  Rest the wrist area for at least 48 hours or as told by your health care provider.  If directed, apply ice to the injured area:  Put ice in a plastic bag.  Place a towel between your skin and the bag.  Leave the ice on for 20 minutes, 2-3 times per day.  Keep your arm raised (elevated) above the level of your heart while you are sitting or lying down.  If a splint or elastic bandage has been applied, use it as told by your health care provider.  Remove the splint or bandage only as told by your health care provider.  Loosen the splint or bandage if your fingers become numb or have a tingling feeling, or if they turn cold or blue.  Take over-the-counter and prescription medicines only as told by your health care provider.  Keep all follow-up visits as told by your health care provider. This is important. SEEK MEDICAL CARE IF:  Your pain is not helped by treatment.  Your pain gets worse. SEEK IMMEDIATE MEDICAL CARE IF:  Your fingers become swollen.  Your fingers turn white, very red, or cold and blue.  Your fingers are numb or have a tingling feeling.  You have  difficulty moving your fingers.   This information is not intended to replace advice given to you by your health care provider. Make sure you discuss any questions you have with your health care provider.   Document Released: 06/12/2005 Document Revised: 05/24/2015 Document Reviewed: 01/18/2015 Elsevier Interactive Patient Education [employer].

## 2016-02-28 ENCOUNTER — Ambulatory Visit (INDEPENDENT_AMBULATORY_CARE_PROVIDER_SITE_OTHER): Payer: Medicare Other | Admitting: Internal Medicine

## 2016-02-28 ENCOUNTER — Encounter: Payer: Self-pay | Admitting: Internal Medicine

## 2016-02-28 VITALS — BP 110/68 | HR 60 | Temp 97.5°F | Resp 16 | Ht 67.25 in | Wt 151.8 lb

## 2016-02-28 DIAGNOSIS — E559 Vitamin D deficiency, unspecified: Secondary | ICD-10-CM

## 2016-02-28 DIAGNOSIS — E782 Mixed hyperlipidemia: Secondary | ICD-10-CM

## 2016-02-28 DIAGNOSIS — I10 Essential (primary) hypertension: Secondary | ICD-10-CM | POA: Diagnosis not present

## 2016-02-28 DIAGNOSIS — R7303 Prediabetes: Secondary | ICD-10-CM | POA: Diagnosis not present

## 2016-02-28 DIAGNOSIS — K219 Gastro-esophageal reflux disease without esophagitis: Secondary | ICD-10-CM | POA: Diagnosis not present

## 2016-02-28 DIAGNOSIS — Z79899 Other long term (current) drug therapy: Secondary | ICD-10-CM

## 2016-02-28 LAB — BASIC METABOLIC PANEL WITH GFR
BUN: 19 mg/dL (ref 7–25)
CALCIUM: 9.9 mg/dL (ref 8.6–10.4)
CHLORIDE: 103 mmol/L (ref 98–110)
CO2: 26 mmol/L (ref 20–31)
CREATININE: 0.86 mg/dL (ref 0.60–0.93)
GFR, EST AFRICAN AMERICAN: 76 mL/min (ref >=60)
GFR, EST NON AFRICAN AMERICAN: 66 mL/min (ref >=60)
GLUCOSE: 98 mg/dL (ref 65–99)
POTASSIUM: 3.5 mmol/L (ref 3.5–5.3)
Sodium: 139 mmol/L (ref 135–146)

## 2016-02-28 LAB — CBC WITH DIFFERENTIAL/PLATELET
Basophils Absolute: 45 cells/uL (ref 0–200)
Basophils Relative: 1 %
Eosinophils Absolute: 405 cells/uL (ref 15–500)
Eosinophils Relative: 9 %
HEMATOCRIT: 36.9 % (ref 35.0–45.0)
HEMOGLOBIN: 12.1 g/dL (ref 11.7–15.5)
LYMPHS ABS: 1800 {cells}/uL (ref 850–3900)
Lymphocytes Relative: 40 %
MCH: 28.4 pg (ref 27.0–33.0)
MCHC: 32.8 g/dL (ref 32.0–36.0)
MCV: 86.6 fL (ref 80.0–100.0)
MONO ABS: 495 {cells}/uL (ref 200–950)
MPV: 8.5 fL (ref 7.5–12.5)
Monocytes Relative: 11 %
NEUTROS ABS: 1755 {cells}/uL (ref 1500–7800)
NEUTROS PCT: 39 %
Platelets: 363 10*3/uL (ref 140–400)
RBC: 4.26 MIL/uL (ref 3.80–5.10)
RDW: 15 % (ref 11.0–15.0)
WBC: 4.5 10*3/uL (ref 3.8–10.8)

## 2016-02-28 LAB — HEPATIC FUNCTION PANEL
ALT: 9 U/L (ref 6–29)
AST: 12 U/L (ref 10–35)
Albumin: 3.7 g/dL (ref 3.6–5.1)
Alkaline Phosphatase: 54 U/L (ref 33–130)
BILIRUBIN DIRECT: 0.1 mg/dL (ref <=0.2)
BILIRUBIN INDIRECT: 0.2 mg/dL (ref 0.2–1.2)
TOTAL PROTEIN: 7.3 g/dL (ref 6.1–8.1)
Total Bilirubin: 0.3 mg/dL (ref 0.2–1.2)

## 2016-02-28 LAB — LIPID PANEL
CHOLESTEROL: 221 mg/dL — AB (ref 125–200)
HDL: 73 mg/dL (ref >=46)
LDL Cholesterol: 132 mg/dL — ABNORMAL HIGH (ref <130)
TRIGLYCERIDES: 79 mg/dL (ref <150)
Total CHOL/HDL Ratio: 3 Ratio (ref <=5.0)
VLDL: 16 mg/dL (ref <30)

## 2016-02-28 LAB — MAGNESIUM: Magnesium: 1.9 mg/dL (ref 1.5–2.5)

## 2016-02-28 LAB — HEMOGLOBIN A1C
HEMOGLOBIN A1C: 6.1 % — AB (ref <5.7)
MEAN PLASMA GLUCOSE: 128 mg/dL

## 2016-02-28 NOTE — Progress Notes (Signed)
Patient ID: Catherine Parks, female   DOB: [birth year], 75 y.o.   MRN: [id number]  [facility]IC[facility]                       Unk Pinto, M.D.        [name], [name]       [name], [name]   [facility]                [address] [place], [place] [id number] Telephone [phone] Telefax [phone] _________________________________________________________________________     This very nice 75 y.o. MBF presents for 6 month follow up with Hypertension, Hyperlipidemia, Pre-Diabetes and Vitamin D Deficiency. Patient also has GERD controlled with diet & current meds.      Patient is treated for HTN  At age 29 circa 71 & BP has been controlled at home. Today's BP: 110/68 mmHg.  In 2006 , she had a negative Cardiolite. Patient has had no complaints of any cardiac type chest pain, palpitations, dyspnea/orthopnea/PND, dizziness, claudication, or dependent edema.     Hyperlipidemia is controlled with diet & meds. Patient denies myalgias or other med SE's. Last Lipids were near goal with Cholesterol 197; HDL 73; sl elevated LDL 108; Triglycerides 80 on 08/04/2015.      Also, the patient has history of PreDiabetes with A1c 6.0% in 2014 and has had no symptoms of reactive hypoglycemia, diabetic polys, paresthesias or visual blurring.  Last A1c was  5.9% on  08/04/2015.     Further, the patient also has history of Vitamin D Deficiency of "18" in 2008  and supplements vitamin D without any suspected side-effects. Last vitamin D was  63 on11/18/2016.     Medication Sig  . acetaminophen 500 MG tablet Take every 6 (six) hours as needed (pain).  Marland Kitchen amLODipine  10 MG  TAKE 1 TAB EVERY DAY   . aspirin EC 81 MG Take 81 mg by mouth 2 (two) times a week.    Marland Kitchen VITAMIN D 1000 units Take 4,000 Units by mouth daily after lunch.  . cloNIDine0.2 MG Take 1 tablet 2 x day for BP (Patient taking differently: Take 0.2 mg by  mouth 2 (two) times daily. For blood pressure)  . FLAX SEED OIL 1000 MG Take 1,000 mg by mouth daily after lunch.   Marland Kitchen ULTRAVATE 0.05 % crm Apply topically 2 (two) times daily.  . meloxicam 7.5 MG Take 1 tablet (7.5 mg total) by mouth daily. With food for pain x 2 weeks  . CLEAR EYES  Place 1 drop into both eyes daily.  Marland Kitchen METAMUCIL Take 5 mLs by mouth daily after lunch. Mix in 8 oz liquid and drink  . triamterene-hctz (MAXZIDE-25) 37.5-25  TAKE 1 TABLET BY MOUTH DAILY.  Marland Kitchen omeprazole  40 MG  Take 40 mg by mouth daily as needed (heartburn / acid reflux).    Allergies  Allergen Reactions  . Lemon Oil Hives    Reaction to lemons  . Shellfish Allergy Swelling   PMHx:   Past Medical History  Diagnosis Date  . Hypertension   . HSV-1 (herpes simplex virus 1) infection   . Diverticulosis   . Colon polyps    Immunization History  Administered Date(s) Administered  . DTaP 07/30/2011  . Pneumococcal Conjugate-13 11/22/2015  . Pneumococcal Polysaccharide-23 05/11/2013  . Pneumococcal-Unspecified 01/07/2002  .  Td 01/07/2002, 05/07/2012   Past Surgical History  Procedure Laterality Date  . Eye surgery      cataracts  . Abdominal hysterectomy    . Colonoscopy    . Direct laryngoscopy N/A 10/21/2014    Procedure: DIRECT LARYNGOSCOPY;  Surgeon: Rozetta Nunnery, MD;  Location: [place];  Service: ENT;  Laterality: N/A;  . Mass excision Right 10/21/2014    Procedure: RIGHT NECK NODE BIOPSY;  Surgeon: Rozetta Nunnery, MD;  Location: [place];  Service: ENT;  Laterality: Right;  . Excision nasal mass Right 10/21/2014    Procedure: EXCISION NASOPHARYNGEAL MASS;  Surgeon: Rozetta Nunnery, MD;  Location: [place];  Service: ENT;  Laterality: Right;   FHx:    Reviewed / unchanged  SHx:    Reviewed / unchanged  Systems Review:  Constitutional: Denies fever, chills, wt changes, headaches, insomnia, fatigue, night sweats, change in  appetite. Eyes: Denies redness, blurred vision, diplopia, discharge, itchy, watery eyes.  ENT: Denies discharge, congestion, post nasal drip, epistaxis, sore throat, earache, hearing loss, dental pain, tinnitus, vertigo, sinus pain, snoring.  CV: Denies chest pain, palpitations, irregular heartbeat, syncope, dyspnea, diaphoresis, orthopnea, PND, claudication or edema. Respiratory: denies cough, dyspnea, DOE, pleurisy, hoarseness, laryngitis, wheezing.  Gastrointestinal: Denies dysphagia, odynophagia, heartburn, reflux, water brash, abdominal pain or cramps, nausea, vomiting, bloating, diarrhea, constipation, hematemesis, melena, hematochezia  or hemorrhoids. Genitourinary: Denies dysuria, frequency, urgency, nocturia, hesitancy, discharge, hematuria or flank pain. Musculoskeletal: Denies arthralgias, myalgias, stiffness, jt. swelling, pain, limping or strain/sprain.  Skin: Denies pruritus, rash, hives, warts, acne, eczema or change in skin lesion(s). Neuro: No weakness, tremor, incoordination, spasms, paresthesia or pain. Psychiatric: Denies confusion, memory loss or sensory loss. Endo: Denies change in weight, skin or hair change.  Heme/Lymph: No excessive bleeding, bruising or enlarged lymph nodes.  Physical Exam  BP 110/68 mmHg  Pulse 60  Temp(Src) 97.5 F (36.4 C)  Resp 16  Ht 5' 7.25" (1.708 m)  Wt 151 lb 12.8 oz (68.856 kg)  BMI 23.60 kg/m2  Appears well nourished and in no distress. Eyes: PERRLA, EOMs, conjunctiva no swelling or erythema. Sinuses: No frontal/maxillary tenderness ENT/Mouth: EAC's clear, TM's nl w/o erythema, bulging. Nares clear w/o erythema, swelling, exudates. Oropharynx clear without erythema or exudates. Oral hygiene is good. Tongue normal, non obstructing. Hearing intact.  Neck: Supple. Thyroid nl. Car 2+/2+ without bruits, nodes or JVD. Chest: Respirations nl with BS clear & equal w/o rales, rhonchi, wheezing or stridor.  Cor: Heart sounds normal w/ regular  rate and rhythm without sig. murmurs, gallops, clicks, or rubs. Peripheral pulses normal and equal  without edema.  Abdomen: Soft & bowel sounds normal. Non-tender w/o guarding, rebound, hernias, masses, or organomegaly.  Lymphatics: Unremarkable.  Musculoskeletal: Full ROM all peripheral extremities, joint stability, 5/5 strength, and normal gait.  Skin: Warm, dry without exposed rashes, lesions or ecchymosis apparent.  Neuro: Cranial nerves intact, reflexes equal bilaterally. Sensory-motor testing grossly intact. Tendon reflexes grossly intact.  Pysch: Alert & oriented x 3.  Insight and judgement nl & appropriate. No ideations.  Assessment and Plan:  1. Essential hypertension  - TSH  2. Hyperlipidemia  - Lipid panel - TSH  3. Prediabetes  - Hemoglobin A1c - Insulin, random  4. Vitamin D deficiency  - VITAMIN D 25 Hydroxy  5. Gastroesophageal reflux disease   6. Medication management  - CBC with Differential/Platelet - BASIC METABOLIC PANEL WITH GFR - Hepatic function panel - Magnesium  Recommended regular exercise, BP monitoring, weight control, and discussed med and SE's. Recommended labs to assess and monitor clinical status. Further disposition pending results of labs. Over 30 minutes of exam, counseling, chart review was performed

## 2016-02-28 NOTE — Patient Instructions (Signed)

## 2016-02-29 LAB — VITAMIN D 25 HYDROXY (VIT D DEFICIENCY, FRACTURES): VIT D 25 HYDROXY: 80 ng/mL (ref 30–100)

## 2016-02-29 LAB — INSULIN, RANDOM: Insulin: 8 u[IU]/mL (ref 2.0–19.6)

## 2016-02-29 LAB — TSH: TSH: 1.18 mIU/L

## 2016-03-03 ENCOUNTER — Other Ambulatory Visit: Payer: Self-pay | Admitting: Internal Medicine

## 2016-04-01 ENCOUNTER — Telehealth: Payer: Self-pay | Admitting: *Deleted

## 2016-04-01 NOTE — Telephone Encounter (Signed)
Patient aware of normal BMD result.

## 2016-04-14 ENCOUNTER — Other Ambulatory Visit: Payer: Self-pay | Admitting: Internal Medicine

## 2016-04-30 ENCOUNTER — Other Ambulatory Visit: Payer: Self-pay | Admitting: Internal Medicine

## 2016-06-07 ENCOUNTER — Encounter: Payer: Self-pay | Admitting: Physician Assistant

## 2016-06-07 ENCOUNTER — Ambulatory Visit (INDEPENDENT_AMBULATORY_CARE_PROVIDER_SITE_OTHER): Payer: Medicare Other | Admitting: Physician Assistant

## 2016-06-07 ENCOUNTER — Ambulatory Visit (HOSPITAL_COMMUNITY)
Admission: RE | Admit: 2016-06-07 | Discharge: 2016-06-07 | Disposition: A | Discharge status: Home / Self Care | Payer: Medicare Other | Source: Ambulatory Visit | Attending: Physician Assistant | Admitting: Physician Assistant

## 2016-06-07 VITALS — BP 134/70 | HR 66 | Resp 14 | Ht 67.25 in | Wt 158.9 lb

## 2016-06-07 DIAGNOSIS — J449 Chronic obstructive pulmonary disease, unspecified: Secondary | ICD-10-CM | POA: Insufficient documentation

## 2016-06-07 DIAGNOSIS — E782 Mixed hyperlipidemia: Secondary | ICD-10-CM | POA: Diagnosis not present

## 2016-06-07 DIAGNOSIS — R0602 Shortness of breath: Secondary | ICD-10-CM | POA: Diagnosis present

## 2016-06-07 DIAGNOSIS — J984 Other disorders of lung: Secondary | ICD-10-CM | POA: Insufficient documentation

## 2016-06-07 DIAGNOSIS — J439 Emphysema, unspecified: Secondary | ICD-10-CM

## 2016-06-07 DIAGNOSIS — J9811 Atelectasis: Secondary | ICD-10-CM | POA: Insufficient documentation

## 2016-06-07 DIAGNOSIS — I10 Essential (primary) hypertension: Secondary | ICD-10-CM

## 2016-06-07 DIAGNOSIS — Z79899 Other long term (current) drug therapy: Secondary | ICD-10-CM

## 2016-06-07 DIAGNOSIS — R7309 Other abnormal glucose: Secondary | ICD-10-CM

## 2016-06-07 DIAGNOSIS — Z23 Encounter for immunization: Secondary | ICD-10-CM | POA: Diagnosis not present

## 2016-06-07 LAB — HEPATIC FUNCTION PANEL
ALBUMIN: 3.8 g/dL (ref 3.6–5.1)
ALK PHOS: 52 U/L (ref 33–130)
ALT: 6 U/L (ref 6–29)
AST: 11 U/L (ref 10–35)
Bilirubin, Direct: 0.1 mg/dL (ref <=0.2)
Indirect Bilirubin: 0.2 mg/dL (ref 0.2–1.2)
Total Bilirubin: 0.3 mg/dL (ref 0.2–1.2)
Total Protein: 7.1 g/dL (ref 6.1–8.1)

## 2016-06-07 LAB — CBC WITH DIFFERENTIAL/PLATELET
BASOS ABS: 48 {cells}/uL (ref 0–200)
BASOS PCT: 1 %
EOS ABS: 432 {cells}/uL (ref 15–500)
Eosinophils Relative: 9 %
HEMATOCRIT: 37.4 % (ref 35.0–45.0)
HEMOGLOBIN: 12.4 g/dL (ref 11.7–15.5)
LYMPHS ABS: 1728 {cells}/uL (ref 850–3900)
Lymphocytes Relative: 36 %
MCH: 28.4 pg (ref 27.0–33.0)
MCHC: 33.2 g/dL (ref 32.0–36.0)
MCV: 85.8 fL (ref 80.0–100.0)
MONO ABS: 432 {cells}/uL (ref 200–950)
MONOS PCT: 9 %
MPV: 8.5 fL (ref 7.5–12.5)
NEUTROS ABS: 2160 {cells}/uL (ref 1500–7800)
Neutrophils Relative %: 45 %
Platelets: 377 10*3/uL (ref 140–400)
RBC: 4.36 MIL/uL (ref 3.80–5.10)
RDW: 15.9 % — ABNORMAL HIGH (ref 11.0–15.0)
WBC: 4.8 10*3/uL (ref 3.8–10.8)

## 2016-06-07 LAB — LIPID PANEL
Cholesterol: 207 mg/dL — ABNORMAL HIGH (ref 125–200)
HDL: 65 mg/dL (ref >=46)
LDL CALC: 129 mg/dL (ref <130)
Total CHOL/HDL Ratio: 3.2 Ratio (ref <=5.0)
Triglycerides: 66 mg/dL (ref <150)
VLDL: 13 mg/dL (ref <30)

## 2016-06-07 LAB — BASIC METABOLIC PANEL WITH GFR
BUN: 19 mg/dL (ref 7–25)
CHLORIDE: 105 mmol/L (ref 98–110)
CO2: 25 mmol/L (ref 20–31)
CREATININE: 1.01 mg/dL — AB (ref 0.60–0.93)
Calcium: 10.1 mg/dL (ref 8.6–10.4)
GFR, Est African American: 63 mL/min (ref >=60)
GFR, Est Non African American: 55 mL/min — ABNORMAL LOW (ref >=60)
GLUCOSE: 99 mg/dL (ref 65–99)
POTASSIUM: 3.9 mmol/L (ref 3.5–5.3)
Sodium: 140 mmol/L (ref 135–146)

## 2016-06-07 LAB — MAGNESIUM: Magnesium: 1.9 mg/dL (ref 1.5–2.5)

## 2016-06-07 LAB — TSH: TSH: 0.86 m[IU]/L

## 2016-06-07 MED ORDER — UMECLIDINIUM-VILANTEROL 62.5-25 MCG/INH IN AEPB: 1.0000 | INHALATION_SPRAY | Freq: Every day | RESPIRATORY_TRACT | 3 refills | Status: DC

## 2016-06-07 MED ORDER — TRAMADOL HCL 50 MG PO TABS: 50.0000 mg | ORAL_TABLET | Freq: Two times a day (BID) | ORAL | 0 refills | Status: DC | PRN

## 2016-06-07 NOTE — Patient Instructions (Signed)
Go get chest x ray If any discomfort/chest pain with shortness of breath, dizziness, sweating, nausea go to ER  Chronic Obstructive Pulmonary Disease Chronic obstructive pulmonary disease (COPD) is a common lung condition in which airflow from the lungs is limited. COPD is a general term that can be used to describe many different lung problems that limit airflow, including both chronic bronchitis and emphysema. If you have COPD, your lung function will probably never return to normal, but there are measures you can take to improve lung function and make yourself feel better. CAUSES   Smoking (common).  Exposure to secondhand smoke.  Genetic problems.  Chronic inflammatory lung diseases or recurrent infections. SYMPTOMS  Shortness of breath, especially with physical activity.  Deep, persistent (chronic) cough with a large amount of thick mucus.  Wheezing.  Rapid breaths (tachypnea).  Gray or bluish discoloration (cyanosis) of the skin, especially in your fingers, toes, or lips.  Fatigue.  Weight loss.  Frequent infections or episodes when breathing symptoms become much worse (exacerbations).  Chest tightness. DIAGNOSIS Your health care provider will take a medical history and perform a physical examination to diagnose COPD. Additional tests for COPD may include:  Lung (pulmonary) function tests.  Chest X-ray.  CT scan.  Blood tests. TREATMENT  Treatment for COPD may include:  Inhaler and nebulizer medicines. These help manage the symptoms of COPD and make your breathing more comfortable.  Supplemental oxygen. Supplemental oxygen is only helpful if you have a low oxygen level in your blood.  Exercise and physical activity. These are beneficial for nearly all people with COPD.  Lung surgery or transplant.  Nutrition therapy to gain weight, if you are underweight.  Pulmonary rehabilitation. This may involve working with a team of health care providers and  specialists, such as respiratory, occupational, and physical therapists. HOME CARE INSTRUCTIONS  Take all medicines (inhaled or pills) as directed by your health care provider.  Avoid over-the-counter medicines or cough syrups that dry up your airway (such as antihistamines) and slow down the elimination of secretions unless instructed otherwise by your health care provider.  If you are a smoker, the most important thing that you can do is stop smoking. Continuing to smoke will cause further lung damage and breathing trouble. Ask your health care provider for help with quitting smoking. He or she can direct you to community resources or hospitals that provide support.  Avoid exposure to irritants such as smoke, chemicals, and fumes that aggravate your breathing.  Use oxygen therapy and pulmonary rehabilitation if directed by your health care provider. If you require home oxygen therapy, ask your health care provider whether you should purchase a pulse oximeter to measure your oxygen level at home.  Avoid contact with individuals who have a contagious illness.  Avoid extreme temperature and humidity changes.  Eat healthy foods. Eating smaller, more frequent meals and resting before meals may help you maintain your strength.  Stay active, but balance activity with periods of rest. Exercise and physical activity will help you maintain your ability to do things you want to do.  Preventing infection and hospitalization is very important when you have COPD. Make sure to receive all the vaccines your health care provider recommends, especially the pneumococcal and influenza vaccines. Ask your health care provider whether you need a pneumonia vaccine.  Learn and use relaxation techniques to manage stress.  Learn and use controlled breathing techniques as directed by your health care provider. Controlled breathing techniques include:  Pursed lip  breathing. Start by breathing in (inhaling) through  your nose for 1 second. Then, purse your lips as if you were going to whistle and breathe out (exhale) through the pursed lips for 2 seconds.  Diaphragmatic breathing. Start by putting one hand on your abdomen just above your waist. Inhale slowly through your nose. The hand on your abdomen should move out. Then purse your lips and exhale slowly. You should be able to feel the hand on your abdomen moving in as you exhale.  Learn and use controlled coughing to clear mucus from your lungs. Controlled coughing is a series of short, progressive coughs. The steps of controlled coughing are: 1. Lean your head slightly forward. 2. Breathe in deeply using diaphragmatic breathing. 3. Try to hold your breath for 3 seconds. 4. Keep your mouth slightly open while coughing twice. 5. Spit any mucus out into a tissue. 6. Rest and repeat the steps once or twice as needed. SEEK MEDICAL CARE IF:  You are coughing up more mucus than usual.  There is a change in the color or thickness of your mucus.  Your breathing is more labored than usual.  Your breathing is faster than usual. SEEK IMMEDIATE MEDICAL CARE IF:  You have shortness of breath while you are resting.  You have shortness of breath that prevents you from:  Being able to talk.  Performing your usual physical activities.  You have chest pain lasting longer than 5 minutes.  Your skin color is more cyanotic than usual.  You measure low oxygen saturations for longer than 5 minutes with a pulse oximeter. MAKE SURE YOU:  Understand these instructions.  Will watch your condition.  Will get help right away if you are not doing well or get worse.   This information is not intended to replace advice given to you by your health care provider. Make sure you discuss any questions you have with your health care provider.   Document Released: 06/12/2005 Document Revised: 09/23/2014 Document Reviewed: 04/29/2013 Elsevier Interactive Patient  Education [employer].

## 2016-06-07 NOTE — Progress Notes (Signed)
Patient ID: Catherine Parks, female   DOB: 21-Jul-1941, 74 y.o.   MRN: [id number]  Assessment and Plan:  Hypertension:  -Continue medication,  -monitor blood pressure at home.  -Continue DASH diet.   -Reminder to go to the ER if any CP, SOB, nausea, dizziness, severe HA, changes vision/speech, left arm numbness and tingling, and jaw pain.  Cholesterol: -Continue diet and exercise.  -Check cholesterol.   Pre-diabetes: -Continue diet and exercise.  -Check A1C  COPD anoro samples given, CXR  SOB Likely COPD, no accompaniments, get CXR, try anoro  Continue diet and meds as discussed. Further disposition pending results of labs.  HPI 75 y.o. AA female  presents for 3 month follow up with hypertension, hyperlipidemia, prediabetes and vitamin D.   Her blood pressure has been controlled at home, today their BP is BP: 134/70.   She does workout, walks and goes to [facility]   She denies chest pain, dizziness. She has COPD, she states the last few days she has had SOB with walking stairs, walking, she has to sit down for a few minutes and then feels better. She denies coughing, wheezing, fever, chills, no accompaniments with the SOB, no CP, nausea, dizziness, diaphoresis.    She is not on cholesterol medication and denies myalgias. Her cholesterol is not at goal. The cholesterol last visit was:   Lab Results  Component Value Date   CHOL 221 (H) 02/28/2016   HDL 73 02/28/2016   LDLCALC 132 (H) 02/28/2016   TRIG 79 02/28/2016   CHOLHDL 3.0 02/28/2016    She has been working on diet and exercise for prediabetes, and denies foot ulcerations, hyperglycemia, hypoglycemia , increased appetite, nausea, paresthesia of the feet, polydipsia, polyuria, visual disturbances, vomiting and weight loss. Last A1C in the office was:  Lab Results  Component Value Date   HGBA1C 6.1 (H) 02/28/2016   Patient is on Vitamin D supplement.  Lab Results  Component Value Date   VD25OH 80 02/28/2016      Current  Medications:  Current Outpatient Prescriptions on File Prior to Visit  Medication Sig Dispense Refill  . acetaminophen (TYLENOL) 500 MG tablet Take 500 mg by mouth every 6 (six) hours as needed (pain).    Marland Kitchen amLODipine (NORVASC) 10 MG tablet TAKE 1 TABLET BY MOUTH EVERY DAY FOR BLOOD PRESSURE 90 tablet 3  . aspirin EC 81 MG tablet Take 81 mg by mouth 2 (two) times a week.      . cholecalciferol (VITAMIN D) 1000 units tablet Take 4,000 Units by mouth daily after lunch.    . cloNIDine (CATAPRES) 0.2 MG tablet TAKE 1 TABLET BY MOUTH TWICE A DAY FOR BLOOD PRESSURE 180 tablet 1  . Flaxseed, Linseed, (FLAX SEED OIL) 1000 MG CAPS Take 1,000 mg by mouth daily after lunch.     . halobetasol (ULTRAVATE) 0.05 % cream Apply topically 2 (two) times daily. 50 g 0  . Naphazoline HCl (CLEAR EYES OP) Place 1 drop into both eyes daily.    . Psyllium (METAMUCIL PO) Take 5 mLs by mouth daily after lunch. Mix in 8 oz liquid and drink    . ranitidine (ZANTAC) 300 MG tablet Take 300 mg by mouth at bedtime. PRN    . triamterene-hydrochlorothiazide (MAXZIDE-25) 37.5-25 MG tablet TAKE 1 TABLET BY MOUTH DAILY. 90 tablet 1   No current facility-administered medications on file prior to visit.     Medical History:  Past Medical History:  Diagnosis Date  . Colon polyps   .  Diverticulosis   . HSV-1 (herpes simplex virus 1) infection   . Hypertension     Allergies:  Allergies  Allergen Reactions  . Lemon Oil Hives    Reaction to lemons  . Shellfish Allergy Swelling     Review of Systems:  Review of Systems  Constitutional: Negative for chills, fever and malaise/fatigue.  HENT: Negative for congestion, ear pain and sore throat.   Respiratory: Positive for shortness of breath. Negative for cough and wheezing.   Cardiovascular: Negative for chest pain, palpitations and leg swelling.  Gastrointestinal: Negative for abdominal pain, blood in stool, constipation, diarrhea, heartburn and melena.  Genitourinary:  Negative.   Skin: Negative.   Neurological: Negative for dizziness, sensory change, loss of consciousness and headaches.  Psychiatric/Behavioral: Negative for depression. The patient is not nervous/anxious and does not have insomnia.     Family history- Review and unchanged  Social history- Review and unchanged  Physical Exam: BP 134/70   Pulse 66   Resp 14   Ht 5' 7.25" (1.708 m)   Wt 158 lb 14.4 oz (72.1 kg)   SpO2 98%   BMI 24.70 kg/m  Wt Readings from Last 3 Encounters:  06/07/16 158 lb 14.4 oz (72.1 kg)  02/28/16 151 lb 12.8 oz (68.9 kg)  01/23/16 152 lb 12.8 oz (69.3 kg)    General Appearance: Well nourished well developed, in no apparent distress. Eyes: PERRLA, EOMs, conjunctiva no swelling or erythema ENT/Mouth: Ear canals normal without obstruction, swelling, erythma, discharge.  TMs normal bilaterally.  Oropharynx moist, clear, without exudate, or postoropharyngeal swelling. Neck: Supple, thyroid normal,no cervical adenopathy  Respiratory: Respiratory effort normal, Breath sounds clear A&P without rhonchi, wheeze, or rale.  No retractions, no accessory usage. Cardio: RRR with no MRGs. Brisk peripheral pulses without edema.  Abdomen: Soft, + BS,  Non tender, no guarding, rebound, hernias, masses. Musculoskeletal: Full ROM, 5/5 strength, Normal gait Skin: Warm, dry without rashes, lesions, ecchymosis.  Neuro: Awake and oriented X 3, Cranial nerves intact. Normal muscle tone, no cerebellar symptoms. Psych: Normal affect, Insight and Judgment appropriate.    Vicie Mutters, PA-C 8:50 AM [facility]

## 2016-06-08 LAB — HEMOGLOBIN A1C
HEMOGLOBIN A1C: 5.7 % — AB (ref <5.7)
MEAN PLASMA GLUCOSE: 117 mg/dL

## 2016-06-10 ENCOUNTER — Other Ambulatory Visit: Payer: Self-pay | Admitting: Physician Assistant

## 2016-06-10 DIAGNOSIS — J90 Pleural effusion, not elsewhere classified: Secondary | ICD-10-CM

## 2016-06-10 DIAGNOSIS — R0602 Shortness of breath: Secondary | ICD-10-CM

## 2016-06-17 ENCOUNTER — Ambulatory Visit (HOSPITAL_COMMUNITY)
Admission: RE | Admit: 2016-06-17 | Discharge: 2016-06-17 | Disposition: A | Discharge status: Home / Self Care | Payer: Medicare Other | Source: Ambulatory Visit | Attending: Physician Assistant | Admitting: Physician Assistant

## 2016-06-17 ENCOUNTER — Encounter (HOSPITAL_COMMUNITY): Payer: Self-pay

## 2016-06-17 DIAGNOSIS — E042 Nontoxic multinodular goiter: Secondary | ICD-10-CM | POA: Insufficient documentation

## 2016-06-17 DIAGNOSIS — J479 Bronchiectasis, uncomplicated: Secondary | ICD-10-CM | POA: Insufficient documentation

## 2016-06-17 DIAGNOSIS — J449 Chronic obstructive pulmonary disease, unspecified: Secondary | ICD-10-CM | POA: Insufficient documentation

## 2016-06-17 DIAGNOSIS — R0602 Shortness of breath: Secondary | ICD-10-CM | POA: Diagnosis not present

## 2016-06-17 DIAGNOSIS — J90 Pleural effusion, not elsewhere classified: Secondary | ICD-10-CM

## 2016-06-18 ENCOUNTER — Other Ambulatory Visit: Payer: Self-pay | Admitting: Physician Assistant

## 2016-06-18 DIAGNOSIS — J439 Emphysema, unspecified: Secondary | ICD-10-CM

## 2016-06-18 DIAGNOSIS — J479 Bronchiectasis, uncomplicated: Secondary | ICD-10-CM

## 2016-07-21 ENCOUNTER — Encounter: Payer: Self-pay | Admitting: *Deleted

## 2016-07-28 ENCOUNTER — Encounter: Payer: Self-pay | Admitting: *Deleted

## 2016-08-02 ENCOUNTER — Encounter: Payer: Self-pay | Admitting: Pulmonary Disease

## 2016-08-02 ENCOUNTER — Other Ambulatory Visit (INDEPENDENT_AMBULATORY_CARE_PROVIDER_SITE_OTHER): Payer: Medicare Other

## 2016-08-02 ENCOUNTER — Telehealth: Payer: Self-pay | Admitting: Pulmonary Disease

## 2016-08-02 ENCOUNTER — Other Ambulatory Visit: Payer: Self-pay | Admitting: Pulmonary Disease

## 2016-08-02 ENCOUNTER — Ambulatory Visit (INDEPENDENT_AMBULATORY_CARE_PROVIDER_SITE_OTHER): Payer: Medicare Other | Admitting: Pulmonary Disease

## 2016-08-02 VITALS — BP 122/70 | HR 64 | Ht 67.25 in | Wt 150.0 lb

## 2016-08-02 DIAGNOSIS — J479 Bronchiectasis, uncomplicated: Secondary | ICD-10-CM

## 2016-08-02 DIAGNOSIS — M1990 Unspecified osteoarthritis, unspecified site: Secondary | ICD-10-CM

## 2016-08-02 DIAGNOSIS — J302 Other seasonal allergic rhinitis: Secondary | ICD-10-CM | POA: Insufficient documentation

## 2016-08-02 DIAGNOSIS — J439 Emphysema, unspecified: Secondary | ICD-10-CM

## 2016-08-02 DIAGNOSIS — R1013 Epigastric pain: Secondary | ICD-10-CM | POA: Diagnosis not present

## 2016-08-02 DIAGNOSIS — K219 Gastro-esophageal reflux disease without esophagitis: Secondary | ICD-10-CM

## 2016-08-02 LAB — C-REACTIVE PROTEIN: CRP: 0.1 mg/dL — AB (ref 0.5–20.0)

## 2016-08-02 LAB — SEDIMENTATION RATE: SED RATE: 59 mm/h — AB (ref 0–30)

## 2016-08-02 NOTE — Telephone Encounter (Signed)
IMAGING CT CHEST W/O 06/17/16 (personally reviewed by me): Patchy opacification with some subpleural reticulation suggested in right upper lung zone. Was focal and lower lung suggestion of bronchiectasis. Upper lobe predominant emphysematous changes. No pleural effusion or thickening. No pericardial effusion. No pathologic mediastinal adenopathy.  CARDIAC  TTE (11/01/14): LV normal in size with EF 60-65%. Hypokinesis of basal inferior myocardium with global lateral strain -21.7%. LA & RA normal in size. RV normal in size and function. Artery systolic pressure 40 mmHg. No aortic stenosis or regurgitation. Aortic root normal in size. Mild mitral regurgitation without stenosis. No colonic stenosis with trivial regurgitation. Moderate tricuspid regurgitation. No pericardial effusion.

## 2016-08-02 NOTE — Patient Instructions (Addendum)
Call me if you have any new problems with your breathing before your next appointment.  You can stop using your Anoro inhaler.   We will do breathing tests at your next appointment.  We will review your test results at your next appointment.  TESTS ORDERED: 1. HRCT Chest w/o 2. Serum Quantitative Immunoglobulin Panel, RAST Panel, ESR, CRP, Anti-CCP, & ANA with Relfex.  3. Esophagram 4. Full PFTs 5. 6MWT on room air  

## 2016-08-02 NOTE — Progress Notes (Signed)
Subjective:    Patient ID: Catherine Parks, female    DOB: March 28, 1941, 75 y.o.   MRN: [id number]  HPI She reports her breathing seems to be worse in the morning. She reports only occasional and milder dyspnea on exertion. Denies any coughing or wheezing. Denies any chest pain or tightness. Denies any dysphagia or odynophagia. She does have morning brash water taste at times. Otherwise no reflux or dyspepsia. She has had abdominal pain for "years". She is not currently taking Ranitidine. She is currently taking Prilosec intermittently. No fever, chills, or sweats. She gets hot flashes with her menopause. She does have eczema but no new rashes. She does have pain in her hips. She denies any joint stiffness, swelling or erythema. She does have dry mouth but no dry eyes. No oral ulcers. She has been on Anoro since October and isn't sure it has significant helped her breathing. Denies any h/o bronchitis or pneumonia. She does have seasonal allergies with sinus congestion & drainage relieved with Benadryl.   Review of Systems  No dysuria or hematuria. No focal weakness, numbness, or tingling. A pertinent 14 point review of systems is negative except as per the history of presenting illness.  Allergies  Allergen Reactions  . Lemon Oil Hives    Reaction to lemons  . Shellfish Allergy Swelling    Current Outpatient Prescriptions on File Prior to Visit  Medication Sig Dispense Refill  . acetaminophen (TYLENOL) 500 MG tablet Take 500 mg by mouth every 6 (six) hours as needed (pain).    Marland Kitchen amLODipine (NORVASC) 10 MG tablet TAKE 1 TABLET BY MOUTH EVERY DAY FOR BLOOD PRESSURE 90 tablet 3  . aspirin EC 81 MG tablet Take 81 mg by mouth 2 (two) times a week.      . Calcium Citrate (CAL-CITRATE PO) Take by mouth.    . cholecalciferol (VITAMIN D) 1000 units tablet Take 4,000 Units by mouth daily after lunch.    . cloNIDine (CATAPRES) 0.2 MG tablet TAKE 1 TABLET BY MOUTH TWICE A DAY FOR BLOOD PRESSURE 180  tablet 1  . Flaxseed, Linseed, (FLAX SEED OIL) 1000 MG CAPS Take 1,000 mg by mouth daily after lunch.     . halobetasol (ULTRAVATE) 0.05 % cream Apply topically 2 (two) times daily. 50 g 0  . Naphazoline HCl (CLEAR EYES OP) Place 1 drop into both eyes daily.    . Psyllium (METAMUCIL PO) Take 5 mLs by mouth daily after lunch. Mix in 8 oz liquid and drink    . ranitidine (ZANTAC) 300 MG tablet Take 300 mg by mouth at bedtime. PRN    . traMADol (ULTRAM) 50 MG tablet Take 1 tablet (50 mg total) by mouth every 12 (twelve) hours as needed for moderate pain. 1 pill twice daily as needed for pain 60 tablet 0  . triamterene-hydrochlorothiazide (MAXZIDE-25) 37.5-25 MG tablet TAKE 1 TABLET BY MOUTH DAILY. 90 tablet 1  . umeclidinium-vilanterol (ANORO ELLIPTA) 62.5-25 MCG/INH AEPB Inhale 1 puff into the lungs daily. Make sure you rinse your mouth after each use. 1 each 3   No current facility-administered medications on file prior to visit.     Past Medical History:  Diagnosis Date  . Allergic rhinitis   . Arthritis   . Bronchiectasis ([place])   . Colon polyps   . Diverticulosis   . Emphysema of lung ([place])   . GERD (gastroesophageal reflux disease)   . HSV-1 (herpes simplex virus 1) infection   . Hypertension  Past Surgical History:  Procedure Laterality Date  . ABDOMINAL HYSTERECTOMY    . COLONOSCOPY    . DIRECT LARYNGOSCOPY N/A 10/21/2014   Procedure: DIRECT LARYNGOSCOPY;  Surgeon: Rozetta Nunnery, MD;  Location: [place];  Service: ENT;  Laterality: N/A;  . EXCISION NASAL MASS Right 10/21/2014   Procedure: EXCISION NASOPHARYNGEAL MASS;  Surgeon: Rozetta Nunnery, MD;  Location: [place];  Service: ENT;  Laterality: Right;  . EYE SURGERY     cataracts  . MASS EXCISION Right 10/21/2014   Procedure: RIGHT NECK NODE BIOPSY;  Surgeon: Rozetta Nunnery, MD;  Location: [place];  Service: ENT;  Laterality: Right;    Family History    Problem Relation Age of Onset  . Heart attack Father   . Hypertension Mother   . Stroke Mother   . Breast cancer Sister   . Cancer Brother   . Prostate cancer Brother   . Breast cancer Maternal Grandmother   . Lung disease Neg Hx   . Rheumatologic disease Neg Hx     Social History   Social History  . Marital status: Widowed    Spouse name: N/A  . Number of children: N/A  . Years of education: N/A   Social History Main Topics  . Smoking status: Former Smoker    Packs/day: 1.00    Years: 58.00    Types: Cigarettes    Start date: 10/26/1956    Quit date: 09/23/2014  . Smokeless tobacco: Never Used     Comment: She only took a break for 2 months  . Alcohol use No  . Drug use: No  . Sexual activity: Not Asked     Comment: has not smoked since 09/27/14-using nicotin gum   Other Topics Concern  . None   Social History Narrative   Originally from [place]. She previously worked as a Pharmacist, hospital. Currently has a dog. Denies any bird exposure. No mold or hot tub exposure.       Objective:   Physical Exam BP 122/70 (BP Location: Left Arm, Cuff Size: Normal)   Pulse 64   Ht 5' 7.25" (1.708 m)   Wt 150 lb (68 kg)   SpO2 100%   BMI 23.32 kg/m  General:  Awake. Alert. No acute distress. Elderly female. Integument:  Warm & dry. No rash on exposed skin. No bruising. Lymphatics:  No appreciated cervical or supraclavicular lymphadenoapthy. HEENT:  Moist mucus membranes. No oral ulcers. No scleral injection or icterus. Minimal nasal turbinate swelling.  Cardiovascular:  Regular rate. No edema.  Normal S1 & S2.  Pulmonary:  Good aeration & clear to auscultation bilaterally. Symmetric chest wall expansion. No accessory muscle use. Abdomen: Soft. Normal bowel sounds. Nondistended. Grossly nontender. Musculoskeletal:  Normal bulk and tone. Hand grip strength 5/5 bilaterally. No joint deformity or effusion appreciated. Neurological:  CN 2-12 grossly in tact. No meningismus. Moving all 4  extremities equally. Symmetric brachioradialis deep tendon reflexes. Psychiatric:  Mood and affect congruent. Speech normal rhythm, rate & tone.   IMAGING CT CHEST W/O 06/17/16 (personally reviewed by me): Patchy opacification with some subpleural reticulation suggested in right upper lung zone. Was focal and lower lung suggestion of bronchiectasis. Upper lobe predominant emphysematous changes. No pleural effusion or thickening. No pericardial effusion. No pathologic mediastinal adenopathy.  CARDIAC  TTE (11/01/14): LV normal in size with EF 60-65%. Hypokinesis of basal inferior myocardium with global lateral strain -21.7%. LA & RA normal in size. RV normal  in size and function. Artery systolic pressure 40 mmHg. No aortic stenosis or regurgitation. Aortic root normal in size. Mild mitral regurgitation without stenosis. No colonic stenosis with trivial regurgitation. Moderate tricuspid regurgitation. No pericardial effusion.    Assessment & Plan:  75 y.o. female with Bronchiectasis and emphysema seen on CT imaging. She has had limited symptomatic response with the initiation of her Anoro inhaler therefore I do not feel she should continue this until we get further pulmonary function testing. Her reflux seems to be reasonably well controlled; however, she is having symptoms that would suggest she may be having silent laryngo-esophageal reflux which could be contributing to the bronchiectasis I'm seeing. She does have some mild arthritis which necessitates ruling out an autoimmune process as a cause for her bronchiectasis. I instructed the patient contact my office if she had any new breathing problems or questions before next appointment.   1. Emphysema: Stopping her inhaler. Screening for alpha-1 antitrypsin deficiency. Checking full pulmonary function testing as well as 6 minute walk test on room air.  2. Bronchiectasis: Checking high-resolution CT chest without contrast. Checking quantitative  immunoglobulin panel. 3. GERD w/ Dyspepsia: Continuing on Prilosec. Checking esophagogram/barium swallow. 4. Arthritis: Checking ESR, CRP, ANA with reflex to copy into panel, and anti-CCP. 5. Chronic Seasonal Allergic Rhinitis: Checking serum RAST panel.  6. Health Maintenance:  S/P Influenza Vaccine September 2017, Prevnar March 2017, Pneumovax 08 May 2013, & Tdap November 2012. 7. Follow-up: Patient to return to clinic in 8 weeks or sooner if needed.   Sonia Baller Ashok Cordia, M.D. [facility] Pager:  [phone] After 3pm or if no response, call [phone] 10:06 AM 08/02/16

## 2016-08-03 LAB — ENA+DNA/DS+SJORGEN'S
DSDNA AB: 181 [IU]/mL — AB (ref 0–9)
ENA SM Ab Ser-aCnc: <0.2 AI (ref 0.0–0.9)
ENA SSA (RO) Ab: <0.2 AI (ref 0.0–0.9)

## 2016-08-03 LAB — ANA W/REFLEX: ANA: POSITIVE — AB

## 2016-08-05 LAB — RESPIRATORY ALLERGY PROFILE REGION II ~~LOC~~
ALLERGEN, COTTONWOOD, T14: 0.12 kU/L — AB
ALLERGEN, D PTERNOYSSINUS, D1: 1.79 kU/L — AB
ASPERGILLUS FUMIGATUS M3: 0.11 kU/L — AB
Allergen, Comm Silver Birch, t9: <0.1 kU/L
Allergen, Mulberry, t76: <0.1 kU/L
Allergen, Oak,t7: <0.1 kU/L
Box Elder IgE: <0.1 kU/L
Cat Dander: <0.1 kU/L
Cockroach: 1.66 kU/L — ABNORMAL HIGH
D. FARINAE: 1.75 kU/L — AB
Dog Dander: 0.16 kU/L — ABNORMAL HIGH
Elm IgE: 0.1 kU/L — ABNORMAL HIGH
IgE (Immunoglobulin E), Serum: 1652 kU/L — ABNORMAL HIGH (ref <115)
JOHNSON GRASS: 0.14 kU/L — AB
Rough Pigweed  IgE: <0.1 kU/L
Sheep Sorrel IgE: 0.12 kU/L — ABNORMAL HIGH
TIMOTHY GRASS: 0.18 kU/L — AB

## 2016-08-05 LAB — CYCLIC CITRUL PEPTIDE ANTIBODY, IGG

## 2016-08-05 LAB — IGG, IGA, IGM
IGM, SERUM: 76 mg/dL (ref 48–271)
IgA: 658 mg/dL — ABNORMAL HIGH (ref 81–463)
IgG (Immunoglobin G), Serum: 1276 mg/dL (ref 694–1618)

## 2016-08-07 ENCOUNTER — Ambulatory Visit (INDEPENDENT_AMBULATORY_CARE_PROVIDER_SITE_OTHER)
Admission: RE | Admit: 2016-08-07 | Discharge: 2016-08-07 | Disposition: A | Discharge status: Home / Self Care | Payer: Medicare Other | Source: Ambulatory Visit | Attending: Pulmonary Disease | Admitting: Pulmonary Disease

## 2016-08-07 DIAGNOSIS — J439 Emphysema, unspecified: Secondary | ICD-10-CM | POA: Diagnosis not present

## 2016-08-10 LAB — ALPHA-1 ANTITRYPSIN PHENOTYPE: A-1 Antitrypsin: 153 mg/dL (ref 83–199)

## 2016-08-13 ENCOUNTER — Ambulatory Visit (HOSPITAL_COMMUNITY)
Admission: RE | Admit: 2016-08-13 | Discharge: 2016-08-13 | Disposition: A | Discharge status: Home / Self Care | Payer: Medicare Other | Source: Ambulatory Visit | Attending: Pulmonary Disease | Admitting: Pulmonary Disease

## 2016-08-13 DIAGNOSIS — R1013 Epigastric pain: Secondary | ICD-10-CM | POA: Insufficient documentation

## 2016-08-13 DIAGNOSIS — K219 Gastro-esophageal reflux disease without esophagitis: Secondary | ICD-10-CM | POA: Insufficient documentation

## 2016-08-14 ENCOUNTER — Telehealth: Payer: Self-pay | Admitting: Pulmonary Disease

## 2016-08-14 DIAGNOSIS — L930 Discoid lupus erythematosus: Secondary | ICD-10-CM

## 2016-08-14 NOTE — Telephone Encounter (Signed)
LMTCB

## 2016-08-16 NOTE — Telephone Encounter (Signed)
Patient calling back - she is looking for results. She will be out of the house at 10am and not back until this afternoon. She would like a vm left - her phone number [phone] -pr

## 2016-08-16 NOTE — Telephone Encounter (Signed)
JN pt is calling for her lab results.  Please advise. Thanks

## 2016-08-18 NOTE — Telephone Encounter (Signed)
As stated before to be relayed to the patient her lab work suggests Lupus, for which we were supposed to refer her to Rheumatology. Her immune system seems to be working normally. She does have some varied environmental allergies that we will address at her follow-up appointment.

## 2016-08-19 NOTE — Telephone Encounter (Signed)
This patient was returning my call to her regarding lab results- results are in my results box.  --------------------------- Please let the patient know that her blood work suggests the possibility of Lupus. We need to refer her to a Rheumatologist for evaluation. Thanks.   Please let the patient know that I reviewed her chest CT scan. She does have some mild emphysema and bronchiectasis which we previously new. She also appears to have some small amount of scar tissue in her lower lungs as well as a spot in her left lower lung. We will discuss this finding further at her follow-up appointment. Thank you.  -----------------------------  Spoke with pt, aware of results/recs.  Referral to rheumatology placed.  Nothing further needed.

## 2016-08-20 NOTE — Progress Notes (Signed)
I called and spoke with the pt and notified her of results/recs

## 2016-08-28 ENCOUNTER — Telehealth: Payer: Self-pay | Admitting: Pulmonary Disease

## 2016-08-28 NOTE — Telephone Encounter (Signed)
Office is currently closed will hold in Triage till tomorrow

## 2016-08-29 NOTE — Telephone Encounter (Signed)
Labs faxed to the number req

## 2016-09-14 ENCOUNTER — Encounter: Payer: Self-pay | Admitting: Internal Medicine

## 2016-09-14 NOTE — Progress Notes (Signed)
[facility] Catherine Parks, M.D.    Catherine Parks. Catherine Parks, [name]      Catherine Parks, [name]  [facility]                [address] [place] [id number] Telephone [phone] Telefax [phone]  Annual Screening/Preventative Visit & Comprehensive Evaluation &  Examination     This very nice 75 y.o. Catherine Parks presents for a Screening/Preventative Visit & comprehensive evaluation and management of multiple medical co-morbidities.  Patient has been followed for HTN, Prediabetes, Hyperlipidemia and Vitamin D Deficiency. Patient also has GERD which apparently is quiescent on prudent diet and current meds (Ranitidine).     Patient was referred to last month by Dr Ashok Cordia, Pulmonology for increasing dyspnea and abn CT Lung scan and f/u high resolution CT is suspect for interstitial Lung Disease and also there was a 6 mm LLL nodule. Dr Ashok Cordia ordered labs which are suspect for Lupus and Rheumatology consult is scheduled with [facility].      HTN predates since 41 (age 47) . Patient's BP has been controlled at home and patient denies any cardiac symptoms as chest pain, palpitations, shortness of breath, dizziness or ankle swelling. Patient had a negative Cardiolite in 2006. Today's BP ia at goal - 120/82      Patient's hyperlipidemia is attempting dietary control. . Patient defers taking Chol meds.  Last lipids were not at goal: Lab Results  Component Value Date   CHOL 207 (H) 06/07/2016   HDL 65 06/07/2016   LDLCALC 129 06/07/2016   TRIG 66 06/07/2016   CHOLHDL 3.2 06/07/2016      Patient has prediabetes with A1c 6.0% predating since  2014  and patient denies reactive hypoglycemic symptoms, visual blurring, diabetic polys, or paresthesias. Last A1c was near goal: Lab Results  Component Value Date   HGBA1C 5.7 (H) 06/07/2016      Finally, patient has history of Vitamin D Deficiency in 2008 of  "18" and last Vitamin D was at goal: Lab Results  Component Value Date   VD25OH 80 02/28/2016   Current Outpatient Prescriptions on File Prior to Visit  Medication Sig  . acetaminophen (TYLENOL) 500 MG tablet Take 500 mg by mouth every 6 (six) hours as needed (pain).  Marland Kitchen amLODipine (NORVASC) 10 MG tablet TAKE 1 TABLET BY MOUTH EVERY DAY FOR BLOOD PRESSURE  . aspirin EC 81 MG tablet Take 81 mg by mouth 2 (two) times a week.    . Calcium Citrate (CAL-CITRATE PO) Take by mouth.  . cholecalciferol (VITAMIN D) 1000 units tablet Take 4,000 Units by mouth daily after lunch.  . cloNIDine (CATAPRES) 0.2 MG tablet TAKE 1 TABLET BY MOUTH TWICE A DAY FOR BLOOD PRESSURE  . Flaxseed, Linseed, (FLAX SEED OIL) 1000 MG CAPS Take 1,000 mg by mouth daily after lunch.   . halobetasol (ULTRAVATE) 0.05 % cream Apply topically 2 (two) times daily.  . Naphazoline HCl (CLEAR EYES OP) Place 1 drop into both eyes daily.  . Psyllium (METAMUCIL PO) Take 5 mLs by mouth daily after lunch. Mix in 8 oz liquid and drink  . ranitidine (ZANTAC) 300 MG tablet Take 300 mg by mouth at bedtime. PRN  . traMADol (ULTRAM) 50 MG tablet Take 1 tablet (50 mg total) by mouth every 12 (twelve) hours as needed for moderate pain. 1 pill twice daily as  needed for pain  . triamterene-hydrochlorothiazide (MAXZIDE-25) 37.5-25 MG tablet TAKE 1 TABLET BY MOUTH DAILY.   No current facility-administered medications on file prior to visit.    Allergies  Allergen Reactions  . Lemon Oil Hives    Reaction to lemons  . Shellfish Allergy Swelling   Past Medical History:  Diagnosis Date  . Allergic rhinitis   . Arthritis   . Bronchiectasis ([place])   . Colon polyps   . Diverticulosis   . Emphysema of lung ([place])   . GERD (gastroesophageal reflux disease)   . HSV-1 (herpes simplex virus 1) infection   . Hypertension    Health Maintenance  Topic Date Due  . ZOSTAVAX  12/16/2018 (Originally 10/26/2000)  . COLONOSCOPY  09/16/2020  . TETANUS/TDAP   05/07/2022  . INFLUENZA VACCINE  Completed  . DEXA SCAN  Completed  . PNA vac Low Risk Adult  Completed   Immunization History  Administered Date(s) Administered  . DTaP 07/30/2011  . Influenza, High Dose Seasonal PF 06/07/2016  . Pneumococcal Conjugate-13 11/22/2015  . Pneumococcal Polysaccharide-23 05/11/2013  . Pneumococcal-Unspecified 01/07/2002  . Td 01/07/2002, 05/07/2012   Past Surgical History:  Procedure Laterality Date  . ABDOMINAL HYSTERECTOMY    . COLONOSCOPY    . DIRECT LARYNGOSCOPY N/A 10/21/2014   Procedure: DIRECT LARYNGOSCOPY;  Surgeon: Rozetta Nunnery, MD;  Location: [place];  Service: ENT;  Laterality: N/A;  . EXCISION NASAL MASS Right 10/21/2014   Procedure: EXCISION NASOPHARYNGEAL MASS;  Surgeon: Rozetta Nunnery, MD;  Location: [place];  Service: ENT;  Laterality: Right;  . EYE SURGERY     cataracts  . MASS EXCISION Right 10/21/2014   Procedure: RIGHT NECK NODE BIOPSY;  Surgeon: Rozetta Nunnery, MD;  Location: [place];  Service: ENT;  Laterality: Right;   Family History  Problem Relation Age of Onset  . Heart attack Father   . Hypertension Mother   . Stroke Mother   . Breast cancer Sister   . Cancer Brother   . Prostate cancer Brother   . Breast cancer Maternal Grandmother   . Lung disease Neg Hx   . Rheumatologic disease Neg Hx    Social History  Substance Use Topics  . Smoking status: Former Smoker    Packs/day: 1.00    Years: 58.00    Types: Cigarettes    Start date: 10/26/1956    Quit date: 09/23/2014  . Smokeless tobacco: Never Used     Comment: She only took a break for 2 months  . Alcohol use No    ROS Constitutional: Denies fever, chills, weight loss/gain, headaches, insomnia,  night sweats, and change in appetite. Does c/o fatigue. Eyes: Denies redness, blurred vision, diplopia, discharge, itchy, watery eyes.  ENT: Denies discharge, congestion, post nasal drip, epistaxis,  sore throat, earache, hearing loss, dental pain, Tinnitus, Vertigo, Sinus pain, snoring.  Cardio: Denies chest pain, palpitations, irregular heartbeat, syncope, dyspnea, diaphoresis, orthopnea, PND, claudication, edema Respiratory: denies cough, does have DOE, no pleurisy, hoarseness, laryngitis, wheezing.  Gastrointestinal: Denies dysphagia, heartburn, reflux, water brash, pain, cramps, nausea, vomiting, bloating, diarrhea, constipation, hematemesis, melena, hematochezia, jaundice, hemorrhoids Genitourinary: Denies dysuria, frequency, urgency, nocturia, hesitancy, discharge, hematuria, flank pain Breast: Breast lumps, nipple discharge, bleeding.  Musculoskeletal: Denies arthralgia,  stiffness, Jt. Swelling, pain, limp, and strain/sprain. Denies falls. Does c/o myalgias of her lower limb girdle musculature, but not specifically her hips or knees.  Skin: Denies puritis, rash, hives, warts, acne, eczema, changing  in skin lesion Neuro: No weakness, tremor, incoordination, spasms, paresthesia, pain Psychiatric: Denies confusion, memory loss, sensory loss. Denies Depression. Endocrine: Denies change in weight, skin, hair change, nocturia, and paresthesia, diabetic polys, visual blurring, hyper / hypo glycemic episodes.  Heme/Lymph: No excessive bleeding, bruising, enlarged lymph nodes.  Physical Exam  BP 120/82   Pulse 76   Temp 97.1 F (36.2 C)   Resp 16   Ht 5' 7.25" (1.708 m)   Wt 152 lb 9.6 oz (69.2 kg)   BMI 23.72 kg/m   General Appearance: Well nourished and in no apparent distress.  Eyes: PERRLA, EOMs, conjunctiva no swelling or erythema, normal fundi and vessels. Sinuses: No frontal/maxillary tenderness ENT/Mouth: EACs patent / TMs  nl. Nares clear without erythema, swelling, mucoid exudates. Oral hygiene is good. No erythema, swelling, or exudate. Tongue normal, non-obstructing. Tonsils not swollen or erythematous. Hearing normal.  Neck: Supple, thyroid normal. No bruits, nodes or  JVD. Respiratory: Respiratory effort normal.  BS equal and clear bilateral without rales, rhonci, wheezing or stridor. Cardio: Heart sounds are normal with regular rate and rhythm and no murmurs, rubs or gallops. Peripheral pulses are normal and equal bilaterally without edema. No aortic or femoral bruits. Chest: symmetric with normal excursions and percussion. Breasts: Symmetric, without lumps, nipple discharge, retractions, or fibrocystic changes.  Abdomen: Flat, soft with bowel sounds active. Nontender, no guarding, rebound, hernias, masses, or organomegaly.  Lymphatics: Non tender without lymphadenopathy.  Genitourinary:  Musculoskeletal: Full ROM all peripheral extremities, joint stability, 5/5 strength, and normal gait. Skin: Warm and dry without rashes, lesions, cyanosis, clubbing or  ecchymosis.  Neuro: Cranial nerves intact, reflexes equal bilaterally. Normal muscle tone, no cerebellar symptoms. Sensation intact.  Pysch: Alert and oriented X 3, normal affect, Insight and Judgment appropriate.   Assessment and Plan  1. Annual Preventative Screening Examination  2. Essential hypertension  - Microalbumin / creatinine urine ratio - EKG 12-Lead - Urinalysis, Routine w reflex microscopic - CBC with Differential/Platelet - BASIC METABOLIC PANEL WITH GFR - TSH  3. Hyperlipidemia  - EKG 12-Lead - Hepatic function panel - Lipid panel - TSH  4. Prediabetes  - EKG 12-Lead - Hemoglobin A1c - Insulin, random  5. Vitamin D deficiency  - VITAMIN D 25 Hydroxy (Vit-D Deficiency, Fractures)  6. Other abnormal glucose   7. Gastroesophageal reflux disease,  quiescent    8. Screening for rectal cancer  - POC Hemoccult Bld/Stl   9. Screening for ischemic heart disease  - EKG 12-Lead  10. Medication management  - Urinalysis, Routine w reflex microscopic - CBC with Differential/Platelet - BASIC METABOLIC PANEL WITH GFR - Hepatic function panel - Magnesium   11.  Interstitial Lung Disease   12. Possible Connective Tissue Disease       Continue prudent diet as discussed, weight control, BP monitoring, regular exercise, and medications. Discussed med's effects and SE's. Screening labs and tests as requested with regular follow-up as recommended. Over 40 minutes of exam, counseling, chart review and high complex critical decision making was performed.

## 2016-09-14 NOTE — Patient Instructions (Signed)

## 2016-09-17 ENCOUNTER — Ambulatory Visit (INDEPENDENT_AMBULATORY_CARE_PROVIDER_SITE_OTHER): Payer: Medicare Other | Admitting: Internal Medicine

## 2016-09-17 ENCOUNTER — Encounter: Payer: Self-pay | Admitting: Internal Medicine

## 2016-09-17 VITALS — BP 120/82 | HR 76 | Temp 97.1°F | Resp 16 | Ht 67.25 in | Wt 152.6 lb

## 2016-09-17 DIAGNOSIS — R7303 Prediabetes: Secondary | ICD-10-CM

## 2016-09-17 DIAGNOSIS — E559 Vitamin D deficiency, unspecified: Secondary | ICD-10-CM

## 2016-09-17 DIAGNOSIS — R6889 Other general symptoms and signs: Secondary | ICD-10-CM

## 2016-09-17 DIAGNOSIS — I10 Essential (primary) hypertension: Secondary | ICD-10-CM | POA: Diagnosis not present

## 2016-09-17 DIAGNOSIS — Z136 Encounter for screening for cardiovascular disorders: Secondary | ICD-10-CM | POA: Diagnosis not present

## 2016-09-17 DIAGNOSIS — Z1212 Encounter for screening for malignant neoplasm of rectum: Secondary | ICD-10-CM

## 2016-09-17 DIAGNOSIS — Z79899 Other long term (current) drug therapy: Secondary | ICD-10-CM

## 2016-09-17 DIAGNOSIS — E782 Mixed hyperlipidemia: Secondary | ICD-10-CM

## 2016-09-17 DIAGNOSIS — K219 Gastro-esophageal reflux disease without esophagitis: Secondary | ICD-10-CM

## 2016-09-17 DIAGNOSIS — Z0001 Encounter for general adult medical examination with abnormal findings: Secondary | ICD-10-CM

## 2016-09-17 DIAGNOSIS — R7309 Other abnormal glucose: Secondary | ICD-10-CM

## 2016-09-17 LAB — URINALYSIS, ROUTINE W REFLEX MICROSCOPIC
BILIRUBIN URINE: NEGATIVE
Glucose, UA: NEGATIVE
Hgb urine dipstick: NEGATIVE
Ketones, ur: NEGATIVE
Leukocytes, UA: NEGATIVE
NITRITE: NEGATIVE
PROTEIN: NEGATIVE
SPECIFIC GRAVITY, URINE: 1.007 (ref 1.001–1.035)
pH: 7.5 (ref 5.0–8.0)

## 2016-09-17 LAB — CBC WITH DIFFERENTIAL/PLATELET
BASOS ABS: 0 {cells}/uL (ref 0–200)
Basophils Relative: 0 %
EOS ABS: 250 {cells}/uL (ref 15–500)
Eosinophils Relative: 5 %
HEMATOCRIT: 37.2 % (ref 35.0–45.0)
HEMOGLOBIN: 11.9 g/dL (ref 11.7–15.5)
LYMPHS ABS: 1700 {cells}/uL (ref 850–3900)
Lymphocytes Relative: 34 %
MCH: 28.8 pg (ref 27.0–33.0)
MCHC: 32 g/dL (ref 32.0–36.0)
MCV: 90.1 fL (ref 80.0–100.0)
MONO ABS: 450 {cells}/uL (ref 200–950)
MPV: 8.4 fL (ref 7.5–12.5)
Monocytes Relative: 9 %
NEUTROS ABS: 2600 {cells}/uL (ref 1500–7800)
NEUTROS PCT: 52 %
Platelets: 344 10*3/uL (ref 140–400)
RBC: 4.13 MIL/uL (ref 3.80–5.10)
RDW: 16.5 % — ABNORMAL HIGH (ref 11.0–15.0)
WBC: 5 10*3/uL (ref 3.8–10.8)

## 2016-09-17 LAB — TSH: TSH: 0.79 m[IU]/L

## 2016-09-18 LAB — HEPATIC FUNCTION PANEL
ALT: 11 U/L (ref 6–29)
AST: 16 U/L (ref 10–35)
Albumin: 3.9 g/dL (ref 3.6–5.1)
Alkaline Phosphatase: 51 U/L (ref 33–130)
BILIRUBIN DIRECT: 0.1 mg/dL (ref <=0.2)
Indirect Bilirubin: 0.2 mg/dL (ref 0.2–1.2)
TOTAL PROTEIN: 7.2 g/dL (ref 6.1–8.1)
Total Bilirubin: 0.3 mg/dL (ref 0.2–1.2)

## 2016-09-18 LAB — MAGNESIUM: MAGNESIUM: 1.8 mg/dL (ref 1.5–2.5)

## 2016-09-18 LAB — INSULIN, RANDOM: Insulin: 6.2 u[IU]/mL (ref 2.0–19.6)

## 2016-09-18 LAB — HEMOGLOBIN A1C
HEMOGLOBIN A1C: 5.6 % (ref <5.7)
MEAN PLASMA GLUCOSE: 114 mg/dL

## 2016-09-18 LAB — BASIC METABOLIC PANEL WITH GFR
BUN: 11 mg/dL (ref 7–25)
CALCIUM: 10.3 mg/dL (ref 8.6–10.4)
CHLORIDE: 105 mmol/L (ref 98–110)
CO2: 24 mmol/L (ref 20–31)
CREATININE: 0.85 mg/dL (ref 0.60–0.93)
GFR, Est African American: 78 mL/min (ref >=60)
GFR, Est Non African American: 67 mL/min (ref >=60)
GLUCOSE: 103 mg/dL — AB (ref 65–99)
Potassium: 4.5 mmol/L (ref 3.5–5.3)
SODIUM: 140 mmol/L (ref 135–146)

## 2016-09-18 LAB — MICROALBUMIN / CREATININE URINE RATIO
CREATININE, URINE: 54 mg/dL (ref 20–320)
MICROALB UR: 0.7 mg/dL
Microalb Creat Ratio: 13 mcg/mg creat (ref <30)

## 2016-09-18 LAB — LIPID PANEL
CHOL/HDL RATIO: 2.8 ratio (ref <5.0)
CHOLESTEROL: 211 mg/dL — AB (ref <200)
HDL: 76 mg/dL (ref >50)
LDL Cholesterol: 116 mg/dL — ABNORMAL HIGH (ref <100)
Triglycerides: 94 mg/dL (ref <150)
VLDL: 19 mg/dL (ref <30)

## 2016-09-18 LAB — VITAMIN D 25 HYDROXY (VIT D DEFICIENCY, FRACTURES): VIT D 25 HYDROXY: 61 ng/mL (ref 30–100)

## 2016-09-27 ENCOUNTER — Encounter: Payer: Self-pay | Admitting: Pulmonary Disease

## 2016-09-27 ENCOUNTER — Ambulatory Visit (INDEPENDENT_AMBULATORY_CARE_PROVIDER_SITE_OTHER): Payer: Medicare Other | Admitting: Pulmonary Disease

## 2016-09-27 ENCOUNTER — Telehealth: Payer: Self-pay | Admitting: Pulmonary Disease

## 2016-09-27 VITALS — BP 130/72 | HR 72 | Ht 67.25 in | Wt 148.8 lb

## 2016-09-27 DIAGNOSIS — R911 Solitary pulmonary nodule: Secondary | ICD-10-CM | POA: Diagnosis not present

## 2016-09-27 DIAGNOSIS — J439 Emphysema, unspecified: Secondary | ICD-10-CM

## 2016-09-27 DIAGNOSIS — J449 Chronic obstructive pulmonary disease, unspecified: Secondary | ICD-10-CM | POA: Diagnosis not present

## 2016-09-27 DIAGNOSIS — J849 Interstitial pulmonary disease, unspecified: Secondary | ICD-10-CM

## 2016-09-27 DIAGNOSIS — IMO0001 Reserved for inherently not codable concepts without codable children: Secondary | ICD-10-CM

## 2016-09-27 DIAGNOSIS — K219 Gastro-esophageal reflux disease without esophagitis: Secondary | ICD-10-CM | POA: Diagnosis not present

## 2016-09-27 LAB — PULMONARY FUNCTION TEST
DL/VA % pred: 60 %
DL/VA: 3.06 ml/min/mmHg/L
DLCO COR: 13.63 ml/min/mmHg
DLCO UNC % PRED: 49 %
DLCO UNC: 13.33 ml/min/mmHg
DLCO cor % pred: 50 %
FEF 25-75 PRE: 1.2 L/s
FEF 25-75 Post: 1.35 L/sec
FEF2575-%Change-Post: 12 %
FEF2575-%PRED-PRE: 74 %
FEF2575-%Pred-Post: 83 %
FEV1-%Change-Post: 2 %
FEV1-%PRED-PRE: 108 %
FEV1-%Pred-Post: 111 %
FEV1-POST: 2.07 L
FEV1-Pre: 2.01 L
FEV1FVC-%CHANGE-POST: 1 %
FEV1FVC-%Pred-Pre: 90 %
FEV6-%CHANGE-POST: 1 %
FEV6-%PRED-PRE: 126 %
FEV6-%Pred-Post: 127 %
FEV6-POST: 2.94 L
FEV6-Pre: 2.91 L
FEV6FVC-%CHANGE-POST: 0 %
FEV6FVC-%PRED-POST: 103 %
FEV6FVC-%Pred-Pre: 103 %
FVC-%Change-Post: 0 %
FVC-%Pred-Post: 122 %
FVC-%Pred-Pre: 121 %
FVC-Post: 2.95 L
FVC-Pre: 2.92 L
POST FEV6/FVC RATIO: 100 %
PRE FEV1/FVC RATIO: 69 %
Post FEV1/FVC ratio: 70 %
Pre FEV6/FVC Ratio: 100 %
RV % pred: 85 %
RV: 2.06 L
TLC % PRED: 94 %
TLC: 5.05 L

## 2016-09-27 NOTE — Progress Notes (Signed)
Test reviewed.  

## 2016-09-27 NOTE — Progress Notes (Signed)
Subjective:    Patient ID: Catherine Parks, female    DOB: 22-Jul-1941, 76 y.o.   MRN: [id number]  C.C.:  Follow-up for Mild COPD w/ Emphysema, ILD, Left Lung Nodule, Chronic Seasonal Allergic Rhinitis, & GERD.   HPI Mild COPD w/ Emphysema: No evidence of alpha-1 antitrypsin deficiency. Previous inhaler stopped at last appointment. No coughing or wheezing.   ILD: Serum workup suggestive of lupus. Patient referred to rheumatology. She does have an upcoming appointment. She reports her baseline level of dyspnea on exertion. Pattern appears to either be atypical UIP or more likely fibrotic NSIP.   Left Lung Nodule: 6 mm left lower lobe nodule noted on high-resolution CT scan from November 2017.  Chronic Seasonal Allergic Rhinitis:  Serum RAST panel only had weakly positive reactions. No sinus congestion, drainage, or pressure.   GERD: Previously prescribed Prilosec. Programs suggested possible esophagitis but no mass or stricture. She reports she only takes "acid tablets" intermittently now. Rare morning brash water taste. No dyspepsia.   Review of Systems No chest tightness or pain. No new fever or chills. Does have chronic sweats. No new rashes or bruising. She continues to have intermittent rashes that dermatology has attributed to eczema.   Allergies  Allergen Reactions  . Lemon Oil Hives    Reaction to lemons  . Shellfish Allergy Swelling    Current Outpatient Prescriptions on File Prior to Visit  Medication Sig Dispense Refill  . acetaminophen (TYLENOL) 500 MG tablet Take 500 mg by mouth every 6 (six) hours as needed (pain).    Marland Kitchen amLODipine (NORVASC) 10 MG tablet TAKE 1 TABLET BY MOUTH EVERY DAY FOR BLOOD PRESSURE 90 tablet 3  . aspirin EC 81 MG tablet Take 81 mg by mouth 2 (two) times a week.      . Calcium Citrate (CAL-CITRATE PO) Take by mouth.    . cholecalciferol (VITAMIN D) 1000 units tablet Take 4,000 Units by mouth daily after lunch.    . cloNIDine (CATAPRES) 0.2 MG  tablet TAKE 1 TABLET BY MOUTH TWICE A DAY FOR BLOOD PRESSURE 180 tablet 1  . Flaxseed, Linseed, (FLAX SEED OIL) 1000 MG CAPS Take 1,000 mg by mouth daily after lunch.     . halobetasol (ULTRAVATE) 0.05 % cream Apply topically 2 (two) times daily. 50 g 0  . Naphazoline HCl (CLEAR EYES OP) Place 1 drop into both eyes daily.    . Psyllium (METAMUCIL PO) Take 5 mLs by mouth daily after lunch. Mix in 8 oz liquid and drink    . ranitidine (ZANTAC) 300 MG tablet Take 300 mg by mouth at bedtime. PRN    . traMADol (ULTRAM) 50 MG tablet Take 1 tablet (50 mg total) by mouth every 12 (twelve) hours as needed for moderate pain. 1 pill twice daily as needed for pain 60 tablet 0  . triamterene-hydrochlorothiazide (MAXZIDE-25) 37.5-25 MG tablet TAKE 1 TABLET BY MOUTH DAILY. 90 tablet 1   No current facility-administered medications on file prior to visit.     Past Medical History:  Diagnosis Date  . Allergic rhinitis   . Arthritis   . Bronchiectasis ([place])   . Colon polyps   . Diverticulosis   . Emphysema of lung ([place])   . GERD (gastroesophageal reflux disease)   . HSV-1 (herpes simplex virus 1) infection   . Hypertension     Past Surgical History:  Procedure Laterality Date  . ABDOMINAL HYSTERECTOMY    . COLONOSCOPY    . DIRECT LARYNGOSCOPY N/A  10/21/2014   Procedure: DIRECT LARYNGOSCOPY;  Surgeon: Rozetta Nunnery, MD;  Location: [facility];  Service: ENT;  Laterality: N/A;  . EXCISION NASAL MASS Right 10/21/2014   Procedure: EXCISION NASOPHARYNGEAL MASS;  Surgeon: Rozetta Nunnery, MD;  Location: [facility];  Service: ENT;  Laterality: Right;  . EYE SURGERY     cataracts  . MASS EXCISION Right 10/21/2014   Procedure: RIGHT NECK NODE BIOPSY;  Surgeon: Rozetta Nunnery, MD;  Location: [facility];  Service: ENT;  Laterality: Right;    Family History  Problem Relation Age of Onset  . Heart attack Father   . Hypertension Mother   . Stroke  Mother   . Breast cancer Sister   . Cancer Brother   . Prostate cancer Brother   . Breast cancer Maternal Grandmother   . Lung disease Neg Hx   . Rheumatologic disease Neg Hx     Social History   Social History  . Marital status: Widowed    Spouse name: N/A  . Number of children: N/A  . Years of education: N/A   Social History Main Topics  . Smoking status: Former Smoker    Packs/day: 1.00    Years: 58.00    Types: Cigarettes    Start date: 10/26/1956    Quit date: 09/23/2014  . Smokeless tobacco: Never Used     Comment: She only took a break for 2 months  . Alcohol use No  . Drug use: No  . Sexual activity: Not Asked     Comment: has not smoked since 09/27/14-using nicotin gum   Other Topics Concern  . None   Social History Narrative   Originally from [place]. She previously worked as a Pharmacist, hospital. Currently has a dog. Denies any bird exposure. No mold or hot tub exposure.       Objective:   Physical Exam BP 130/72   Pulse 72   Ht 5' 7.25" (1.708 m)   Wt 148 lb 12.8 oz (67.5 kg)   SpO2 100%   BMI 23.13 kg/m   Gen.: Elderly female. Appears comfortable. Well-dressed. Integument: No rash or bruising on exposed skin. No icterus. Extremities: No cyanosis or clubbing. HEENT: Moist mucous membranes. Continued mild bilateral nasal turbinate swelling. No oral ulcers. Cardiovascular: Regular rate and rhythm. Normal S1 & S2. No edema. Pulmonary: Overall clear with auscultation bilaterally. No accessory muscle use on room air and speaking in complete sentences. Abdomen: Soft and nontender. Nondistended. Musculoskeletal: No joint deformity or effusion appreciated. No synovial thickening appreciated.  PFT 09/27/16:FVC 2.92 L (121%) FEV1 2.01 L (180%) FEV1/FVC 0.69 FEF 25-75 1.20 L (74%) negative bronchodilator response TLC 5.05 L (94%) RV 85% ERV 501% DLCO corrected 50% (Hgb 12.7)  6MWT 09/27/16:  Walked 377 meters / Baseline Sat 100% on RA / Nadir Sat 90% @ end of  walk  IMAGING ESOPHAGRAM/BARIUM SWALLOW 08/13/16 (per radiologist): Laryngeal penetration during swallowing due to delayed initiation with no tracheal aspiration. Slight feline esophagus appearance can sometimes correlate with mild esophagitis. No ulceration or erosions identified. No dysmotility. No reflux.  HRCT CHEST W/O 08/07/16 (personally reviewed by me):  Mild to moderate apical predominant centrilobular emphysema. 6 mm left lower lobe nodule adjacent the diaphragm is noted. Slight craniocaudal progression of subpleural reticulation with some traction bronchiectasis within right lower lobe as well as possible early honeycombing within same lobe. No obvious groundglass opacities. No pleural effusion or thickening. No pericardial effusion. No pathologic mediastinal adenopathy.  CT CHEST W/O 06/17/16 (previously reviewed by me): Patchy opacification with some subpleural reticulation suggested in right upper lung zone. Was focal and lower lung suggestion of bronchiectasis. Upper lobe predominant emphysematous changes. No pleural effusion or thickening. No pericardial effusion. No pathologic mediastinal adenopathy.  CARDIAC  TTE (11/01/14): LV normal in size with EF 60-65%. Hypokinesis of basal inferior myocardium with global lateral strain -21.7%. LA & RA normal in size. RV normal in size and function. Artery systolic pressure 40 mmHg. No aortic stenosis or regurgitation. Aortic root normal in size. Mild mitral regurgitation without stenosis. No colonic stenosis with trivial regurgitation. Moderate tricuspid regurgitation. No pericardial effusion.  LABS 08/02/16 Alpha-1 antitrypsin: MM (153) IgG: 1276 IgA: 658 IgM: 76 IgE: 1652 RAST Panel:  D farinae 1.75 / Cockroach 1.66 & other weak positives CRP: 0.1 ESR: 59 ANA:  Positive DS DNA Ab:  181 Smith Ab:  <0.2 RNP Ab:  <0.2 SSA:  <0.2 SSB:  <0.2 Anti-CCP:  <16    Assessment & Plan:  76 y.o. female with mild COPD with emphysema,  interstitial lung disease, left lung nodule, chronic seasonal allergic rhinitis, & GERD. I reviewed the patient's high-resolution CT scan with her today as well as her pulmonary function testing. The degree of interstitial lung disease appears mild and it's difficult to determine whether or not there is any active inflammation but I do not appreciate any groundglass that would suggest such. In the absence of worsening symptoms and/or lung function I feel that holding off on systemic immunosuppression is reasonable at this time. I did discuss the potential need to initiate treatment if she experiences any decline in lung function or evidence of active disease. She does have an upcoming appointment with rheumatology which I feel will help to provide further information in her disease process. From her underlying emphysema and mild COPD she has little symptoms that would suggest any potential response to bronchodilator or inhaled medication therapies. We also discussed her left lung nodule which given its size warrants repeat CT imaging to ensure that this is not an early malignancy. We did discuss the possibility that this represents an underlying fibrotic process or even an autoimmune process related to her interstitial lung disease. I did spend a significant amount of time today counseling the patient on the need for complete control of her reflux. Worsening of her underlying interstitial lung disease. We are holding off on surgical lung biopsy at this time given the patient's serum workup and high-resolution CT scan findings. I instructed the patient contact my office if she had any new breathing problems or questions before next appointment.  1. Mild COPD w/ Emphysema:  Holding off on inhaled medications at this time. Repeat spirometry with DLCO at next appointment. 2. ILD: Likely secondary to underlying lupus. Holding off on systemic immunosuppression. Rheumatology follow-up arranged. Repeat spirometry with  DLCO and 6 prolonged test on room air at next appointment. Referring to pulmonary rehabilitation. 3. Left Lung Nodule: Plan for repeat CT chest without contrast February 2018. 4. Chronic Seasonal Allergic Rhinitis: Remains relatively asymptomatic. Not currently on medications. 5. GERD: Recommended continued monitoring of symptoms. Patient to try her Zantac at night if needed. 6. Health Maintenance:  S/P Influenza Vaccine September 2017, Prevnar March 2017, Pneumovax 08 May 2013, & Tdap November 2012. 7. Follow-up: Patient to return to clinic in 3 months or sooner if needed.  Sonia Baller Ashok Cordia, M.D. [facility] Pager:  [phone] After 3pm or if no response, call [phone]  2:22 PM 09/27/16

## 2016-09-27 NOTE — Telephone Encounter (Signed)
IMAGING ESOPHAGRAM/BARIUM SWALLOW 08/13/16 (per radiologist): Laryngeal penetration during swallowing due to delayed initiation with no tracheal aspiration. Slight feline esophagus appearance can sometimes correlate with mild esophagitis. No ulceration or erosions identified. No dysmotility. No reflux.  HRCT CHEST W/O 08/07/16 (personally reviewed by me):  Mild to moderate apical predominant centrilobular emphysema. 6 mm left lower lobe nodule adjacent the diaphragm is noted. Slight craniocaudal progression of subpleural reticulation with some traction bronchiectasis within right lower lobe as well as possible early honeycombing within same lobe. No obvious groundglass opacities. No pleural effusion or thickening. No pericardial effusion. No pathologic mediastinal adenopathy.  LABS 08/02/16 Alpha-1 antitrypsin: MM (153) IgG: 1276 IgA: 658 IgM: 76 IgE: 1652 RAST Panel:  D farinae 1.75 / Cockroach 1.66 & other weak positives CRP: 0.1 ESR: 59 ANA:  Positive DS DNA Ab:  181 Smith Ab:  <0.2 RNP Ab:  <0.2 SSA:  <0.2 SSB:  <0.2 Anti-CCP:  <16

## 2016-09-27 NOTE — Patient Instructions (Addendum)
   Pay attention to your reflux & upset stomach. We need to make sure this is under control.  You may need to take Zantac 75 or 150mg  at night before bed to keep your reflux under control.  I will see you back in 3 months with a breathing & walking test. Call me if you have any problems or questions before then.  TESTS ORDERED: 1. Spirometry with DLCO at next appointment 2. 6MWT on room air at next appointment  3. CT Chest w/o February 2018

## 2016-09-30 ENCOUNTER — Other Ambulatory Visit: Payer: Self-pay | Admitting: Internal Medicine

## 2016-10-11 ENCOUNTER — Other Ambulatory Visit: Payer: Self-pay | Admitting: Physician Assistant

## 2016-10-16 ENCOUNTER — Other Ambulatory Visit (INDEPENDENT_AMBULATORY_CARE_PROVIDER_SITE_OTHER): Payer: Medicare Other | Admitting: *Deleted

## 2016-10-16 DIAGNOSIS — Z1212 Encounter for screening for malignant neoplasm of rectum: Secondary | ICD-10-CM

## 2016-10-16 LAB — POC HEMOCCULT BLD/STL (HOME/3-CARD/SCREEN)
FECAL OCCULT BLD: NEGATIVE
FECAL OCCULT BLD: NEGATIVE
Fecal Occult Blood, POC: NEGATIVE

## 2016-10-21 ENCOUNTER — Ambulatory Visit (HOSPITAL_COMMUNITY): Payer: Medicare Other

## 2016-10-22 ENCOUNTER — Ambulatory Visit (INDEPENDENT_AMBULATORY_CARE_PROVIDER_SITE_OTHER)
Admission: RE | Admit: 2016-10-22 | Discharge: 2016-10-22 | Disposition: A | Discharge status: Home / Self Care | Payer: Medicare Other | Source: Ambulatory Visit | Attending: Pulmonary Disease | Admitting: Pulmonary Disease

## 2016-10-22 DIAGNOSIS — J849 Interstitial pulmonary disease, unspecified: Secondary | ICD-10-CM | POA: Diagnosis not present

## 2016-10-22 DIAGNOSIS — J449 Chronic obstructive pulmonary disease, unspecified: Secondary | ICD-10-CM

## 2016-10-28 ENCOUNTER — Encounter (HOSPITAL_COMMUNITY): Payer: Self-pay

## 2016-10-28 ENCOUNTER — Encounter (HOSPITAL_COMMUNITY)
Admission: RE | Admit: 2016-10-28 | Discharge: 2016-10-28 | Disposition: A | Discharge status: Home / Self Care | Payer: Medicare Other | Source: Ambulatory Visit | Attending: Pulmonary Disease | Admitting: Pulmonary Disease

## 2016-10-28 VITALS — BP 138/71 | HR 66 | Resp 18 | Ht 66.0 in | Wt 149.0 lb

## 2016-10-28 DIAGNOSIS — J849 Interstitial pulmonary disease, unspecified: Secondary | ICD-10-CM | POA: Diagnosis present

## 2016-10-28 DIAGNOSIS — J449 Chronic obstructive pulmonary disease, unspecified: Secondary | ICD-10-CM | POA: Diagnosis not present

## 2016-10-28 DIAGNOSIS — R911 Solitary pulmonary nodule: Secondary | ICD-10-CM | POA: Diagnosis not present

## 2016-10-28 NOTE — Progress Notes (Signed)
Catherine Parks 76 y.o. female Pulmonary Rehab Orientation Note Patient arrived today in [facility] for orientation to Pulmonary Rehab. She was transported from [employer] via wheel chair. She does not carry portable oxygen and has not been prescribed oxygen for home use. Color good, skin warm and dry. Patient is oriented to time and place. Patient's medical history, psychosocial health, and medications reviewed. Psychosocial assessment reveals pt lives alone. She was widowed 3 years ago. She has two sons that live locally with their families. Pt is currently retired. 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 She is very active in her church and enjoys singing with a travel choir. Pt reports her stress level is low. Pt does not exhibit signs of depression. PHQ2/9 score 1/na. Pt shows good  coping skills with positive outlook. She is  offered emotional support and reassurance. Will continue to monitor and evaluate progress toward psychosocial goal(s) of remaining positive about her physical and mental health. Physical assessment reveals heart rate is normal, breath sounds clear to auscultation, no wheezes, rales, or rhonchi. Grip strength equal, strong. Distal pulses palpable. No edema noted.Patient reports she does take medications as prescribed. Patient states she follows a Low Sodium diet. The patient reports no specific efforts to gain or lose weight. Patient's weight will be monitored closely. Demonstration and practice of PLB using pulse oximeter. Patient able to return demonstration satisfactorily. Safety and hand hygiene in the exercise area reviewed with patient. Patient voices understanding of the information reviewed. Department expectations discussed with patient and achievable goals were set. The patient shows enthusiasm about attending the program and we look forward to working with this nice lady. The patient is scheduled for a 6 min walk test on Thursday 2/15 and to begin  exercise on Thursday 2/22.   45 minutes was spent on a variety of activities such as assessment of the patient, obtaining baseline data including height, weight, BMI, and grip strength, verifying medical history, allergies, and current medications, and teaching patient strategies for performing tasks with less respiratory effort with emphasis on pursed lip breathing.

## 2016-10-29 ENCOUNTER — Encounter (HOSPITAL_COMMUNITY): Payer: Self-pay | Admitting: *Deleted

## 2016-10-31 ENCOUNTER — Encounter (HOSPITAL_COMMUNITY)
Admission: RE | Admit: 2016-10-31 | Discharge: 2016-10-31 | Disposition: A | Discharge status: Home / Self Care | Payer: Medicare Other | Source: Ambulatory Visit | Attending: Pulmonary Disease | Admitting: Pulmonary Disease

## 2016-10-31 DIAGNOSIS — J849 Interstitial pulmonary disease, unspecified: Secondary | ICD-10-CM

## 2016-10-31 NOTE — Progress Notes (Signed)
Pulmonary Individual Treatment Plan  Patient Details  Name: Catherine Parks MRN: [id number] Date of Birth: 05-07-1941 Referring Provider:   April Manson Pulmonary Rehab Walk Test from 10/31/2016 in [place]  Referring Provider  Dr. Ashok Cordia      Initial Encounter Date:  Flowsheet Row Pulmonary Rehab Walk Test from 10/31/2016 in [place]  Date  10/31/16  Referring Provider  Dr. Ashok Cordia      Visit Diagnosis: ILD (interstitial lung disease) ([place])  Patient's Home Medications on Admission:   Current Outpatient Prescriptions:  .  acetaminophen (TYLENOL) 500 MG tablet, Take 500 mg by mouth every 6 (six) hours as needed (pain)., Disp: , Rfl:  .  amLODipine (NORVASC) 10 MG tablet, TAKE 1 TABLET BY MOUTH EVERY DAY FOR BLOOD PRESSURE, Disp: 90 tablet, Rfl: 3 .  aspirin EC 81 MG tablet, Take 81 mg by mouth 2 (two) times a week.  , Disp: , Rfl:  .  Calcium Citrate (CAL-CITRATE PO), Take by mouth., Disp: , Rfl:  .  cholecalciferol (VITAMIN D) 1000 units tablet, Take 4,000 Units by mouth daily after lunch., Disp: , Rfl:  .  cloNIDine (CATAPRES) 0.2 MG tablet, TAKE 1 TABLET BY MOUTH TWICE A DAY FOR BLOOD PRESSURE, Disp: 180 tablet, Rfl: 1 .  Flaxseed, Linseed, (FLAX SEED OIL) 1000 MG CAPS, Take 1,000 mg by mouth daily after lunch. , Disp: , Rfl:  .  halobetasol (ULTRAVATE) 0.05 % cream, Apply topically 2 (two) times daily., Disp: 50 g, Rfl: 0 .  Magnesium 500 MG TABS, Take 500 mg by mouth., Disp: , Rfl:  .  Naphazoline HCl (CLEAR EYES OP), Place 1 drop into both eyes daily., Disp: , Rfl:  .  Psyllium (METAMUCIL PO), Take 5 mLs by mouth daily after lunch. Mix in 8 oz liquid and drink, Disp: , Rfl:  .  ranitidine (ZANTAC) 300 MG tablet, Take 300 mg by mouth at bedtime. PRN, Disp: , Rfl:  .  traMADol (ULTRAM) 50 MG tablet, Take 1 tablet (50 mg total) by mouth every 12 (twelve) hours as needed for moderate pain. 1 pill twice daily as  needed for pain, Disp: 60 tablet, Rfl: 0 .  triamterene-hydrochlorothiazide (MAXZIDE-25) 37.5-25 MG tablet, TAKE 1 TABLET BY MOUTH DAILY., Disp: 90 tablet, Rfl: 1  Past Medical History: Past Medical History:  Diagnosis Date  . Allergic rhinitis   . Arthritis   . Bronchiectasis ([place])   . Colon polyps   . Diverticulosis   . Emphysema of lung ([place])   . GERD (gastroesophageal reflux disease)   . HSV-1 (herpes simplex virus 1) infection   . Hypertension     Tobacco Use: History  Smoking Status  . Former Smoker  . Packs/day: 1.00  . Years: 58.00  . Types: Cigarettes  . Start date: 10/26/1956  . Quit date: 09/23/2014  Smokeless Tobacco  . Never Used    Comment: She only took a break for 2 months    Labs: Recent Review Flowsheet Data    Labs for ITP Cardiac and Pulmonary Rehab Latest Ref Rng & Units 04/10/2015 08/04/2015 02/28/2016 06/07/2016 09/17/2016   Cholestrol <200 mg/dL 204(H) 197 221(H) 207(H) 211(H)   LDLCALC <100 mg/dL 102 108 132(H) 129 116(H)   HDL >50 mg/dL 85 73 73 65 76   Trlycerides <150 mg/dL 83 80 79 66 94   Hemoglobin A1c <5.7 % 6.0(H) 5.9(H) 6.1(H) 5.7(H) 5.6      Capillary Blood Glucose: Lab  Results  Component Value Date   GLUCAP 83 11/19/2015   GLUCAP 107 (H) 11/01/2014     ADL UCSD:     Pulmonary Assessment Scores    Row Name 10/29/16 1154         ADL UCSD   ADL Phase Entry     SOB Score total 50        Pulmonary Function Assessment:   Exercise Target Goals: Date: 10/31/16  Exercise Program Goal: Individual exercise prescription set with THRR, safety & activity barriers. Participant demonstrates ability to understand and report RPE using BORG scale, to self-measure pulse accurately, and to acknowledge the importance of the exercise prescription.  Exercise Prescription Goal: Starting with aerobic activity 30 plus minutes a day, 3 days per week for initial exercise prescription. Provide home exercise prescription and guidelines that  participant acknowledges understanding prior to discharge.  Activity Barriers & Risk Stratification:     Activity Barriers & Cardiac Risk Stratification - 10/28/16 1015      Activity Barriers & Cardiac Risk Stratification   Activity Barriers Arthritis;Shortness of Breath      6 Minute Walk:     6 Minute Walk    Row Name 10/31/16 1513         6 Minute Walk   Phase Initial     Distance 1175 feet     Walk Time 6 minutes     # of Rest Breaks 0     MPH 2.22     METS 2.68     RPE 12     Perceived Dyspnea  3     Symptoms No     Resting HR 80 bpm     Resting BP 124/84     Max Ex. HR 105 bpm     Max Ex. BP 140/80       Interval HR   Baseline HR 80     1 Minute HR 95     2 Minute HR 85     3 Minute HR 92     4 Minute HR 94     5 Minute HR 105     6 Minute HR 104     2 Minute Post HR 95     Interval Heart Rate? Yes       Interval Oxygen   Interval Oxygen? Yes     Baseline Oxygen Saturation % 100 %     Baseline Liters of Oxygen 0 L     1 Minute Oxygen Saturation % 94 %     1 Minute Liters of Oxygen 0 L     2 Minute Oxygen Saturation % 85 %  questionable reading     2 Minute Liters of Oxygen 0 L     3 Minute Oxygen Saturation % 100 %     3 Minute Liters of Oxygen 0 L     4 Minute Oxygen Saturation % 95 %     4 Minute Liters of Oxygen 0 L     5 Minute Oxygen Saturation % 94 %     5 Minute Liters of Oxygen 0 L     6 Minute Oxygen Saturation % 94 %     6 Minute Liters of Oxygen 0 L     2 Minute Post Oxygen Saturation % 96 %     2 Minute Post Liters of Oxygen 0 L        Initial Exercise Prescription:     Initial Exercise Prescription - 10/31/16  1500      Date of Initial Exercise RX and Referring Provider   Date 10/31/16   Referring Provider Dr. Vevelyn Royals   Level 0.5   Minutes 17     NuStep   Level 2   Minutes 17   METs 1.5     Track   Laps 8   Minutes 17     Prescription Details   Frequency (times per week) 2   Duration Progress to 45  minutes of aerobic exercise without signs/symptoms of physical distress     Intensity   THRR 40-80% of Max Heartrate 58-115   Ratings of Perceived Exertion 11-13   Perceived Dyspnea 0-4     Progression   Progression Continue progressive overload as per policy without signs/symptoms or physical distress.     Resistance Training   Training Prescription Yes   Weight orange bands   Reps 10-12      Perform Capillary Blood Glucose checks as needed.  Exercise Prescription Changes:   Exercise Comments:   Discharge Exercise Prescription (Final Exercise Prescription Changes):    Nutrition:  Target Goals: Understanding of nutrition guidelines, daily intake of sodium 1500mg , cholesterol 200mg , calories 30% from fat and 7% or less from saturated fats, daily to have 5 or more servings of fruits and vegetables.  Biometrics:     Pre Biometrics - 10/28/16 1028      Pre Biometrics   Grip Strength 20 kg       Nutrition Therapy Plan and Nutrition Goals:   Nutrition Discharge: Rate Your Plate Scores:   Psychosocial: Target Goals: Acknowledge presence or absence of depression, maximize coping skills, provide positive support system. Participant is able to verbalize types and ability to use techniques and skills needed for reducing stress and depression.  Initial Review & Psychosocial Screening:     Initial Psych Review & Screening - 10/28/16 [place]? Yes     Barriers   Psychosocial barriers to participate in program There are no identifiable barriers or psychosocial needs.;The patient should benefit from training in stress management and relaxation.     Screening Interventions   Interventions Encouraged to exercise      Quality of Life Scores:     Quality of Life - 10/29/16 1154      Quality of Life Scores   Health/Function Pre 14.29 %   Socioeconomic Pre 14.07 %   Psych/Spiritual Pre 18.29 %   Family Pre 10.17 %    GLOBAL Pre 14.74 %      PHQ-9: Recent Review Flowsheet Data    Depression screen [facility] 2/9 10/28/2016 09/17/2016 09/14/2016 11/22/2015 08/04/2015   Decreased Interest 1 0 0 0 0   Down, Depressed, Hopeless 0 0 0 0 0   PHQ - 2 Score 1 0 0 0 0      Psychosocial Evaluation and Intervention:     Psychosocial Evaluation - 10/28/16 1035      Psychosocial Evaluation & Interventions   Interventions Encouraged to exercise with the program and follow exercise prescription      Psychosocial Re-Evaluation:  Education: Education Goals: Education classes will be provided on a weekly basis, covering required topics. Participant will state understanding/return demonstration of topics presented.  Learning Barriers/Preferences:     Learning Barriers/Preferences - 10/28/16 1025      Learning Barriers/Preferences   Learning Barriers None   Learning Preferences Group Instruction;Individual Instruction;Computer/Internet;Verbal Instruction;Written Material  Education Topics: Risk Factor Reduction:  -Group instruction that is supported by a PowerPoint presentation. Instructor discusses the definition of a risk factor, different risk factors for pulmonary disease, and how the heart and lungs work together.     Nutrition for Pulmonary Patient:  -Group instruction provided by PowerPoint slides, verbal discussion, and written materials to support subject matter. The instructor gives an explanation and review of healthy diet recommendations, which includes a discussion on weight management, recommendations for fruit and vegetable consumption, as well as protein, fluid, caffeine, fiber, sodium, sugar, and alcohol. Tips for eating when patients are short of breath are discussed.   Pursed Lip Breathing:  -Group instruction that is supported by demonstration and informational handouts. Instructor discusses the benefits of pursed lip and diaphragmatic breathing and detailed demonstration on how to preform  both.     Oxygen Safety:  -Group instruction provided by PowerPoint, verbal discussion, and written material to support subject matter. There is an overview of "What is Oxygen" and "Why do we need it".  Instructor also reviews how to create a safe environment for oxygen use, the importance of using oxygen as prescribed, and the risks of noncompliance. There is a brief discussion on traveling with oxygen and resources the patient may utilize.   Oxygen Equipment:  -Group instruction provided by [facility] Staff utilizing handouts, written materials, and equipment demonstrations.   Signs and Symptoms:  -Group instruction provided by written material and verbal discussion to support subject matter. Warning signs and symptoms of infection, stroke, and heart attack are reviewed and when to call the physician/911 reinforced. Tips for preventing the spread of infection discussed.   Advanced Directives:  -Group instruction provided by verbal instruction and written material to support subject matter. Instructor reviews Advanced Directive laws and proper instruction for filling out document.   Pulmonary Video:  -Group video education that reviews the importance of medication and oxygen compliance, exercise, good nutrition, pulmonary hygiene, and pursed lip and diaphragmatic breathing for the pulmonary patient.   Exercise for the Pulmonary Patient:  -Group instruction that is supported by a PowerPoint presentation. Instructor discusses benefits of exercise, core components of exercise, frequency, duration, and intensity of an exercise routine, importance of utilizing pulse oximetry during exercise, safety while exercising, and options of places to exercise outside of rehab.     Pulmonary Medications:  -Verbally interactive group education provided by instructor with focus on inhaled medications and proper administration.   Anatomy and Physiology of the Respiratory System and Intimacy:  -Group  instruction provided by PowerPoint, verbal discussion, and written material to support subject matter. Instructor reviews respiratory cycle and anatomical components of the respiratory system and their functions. Instructor also reviews differences in obstructive and restrictive respiratory diseases with examples of each. Intimacy, Sex, and Sexuality differences are reviewed with a discussion on how relationships can change when diagnosed with pulmonary disease. Common sexual concerns are reviewed.   Knowledge Questionnaire Score:     Knowledge Questionnaire Score - 10/29/16 1154      Knowledge Questionnaire Score   Pre Score 12/13      Core Components/Risk Factors/Patient Goals at Admission:     Personal Goals and Risk Factors at Admission - 10/28/16 1030      Core Components/Risk Factors/Patient Goals on Admission   Increase Strength and Stamina Yes   Improve shortness of breath with ADL's Yes   Intervention Provide education, individualized exercise plan and daily activity instruction to help decrease symptoms of SOB with activities of  daily living.   Expected Outcomes Short Term: Achieves a reduction of symptoms when performing activities of daily living.   Develop more efficient breathing techniques such as purse lipped breathing and diaphragmatic breathing; and practicing self-pacing with activity Yes   Intervention Provide education, demonstration and support about specific breathing techniuqes utilized for more efficient breathing. Include techniques such as pursed lipped breathing, diaphragmatic breathing and self-pacing activity.   Expected Outcomes Short Term: Participant will be able to demonstrate and use breathing techniques as needed throughout daily activities.   Hypertension Yes   Intervention Provide education on lifestyle modifcations including regular physical activity/exercise, weight management, moderate sodium restriction and increased consumption of fresh fruit,  vegetables, and low fat dairy, alcohol moderation, and smoking cessation.;Monitor prescription use compliance.   Expected Outcomes Short Term: Continued assessment and intervention until BP is < 140/71mm HG in hypertensive participants. < 130/25mm HG in hypertensive participants with diabetes, heart failure or chronic kidney disease.;Long Term: Maintenance of blood pressure at goal levels.      Core Components/Risk Factors/Patient Goals Review:    Core Components/Risk Factors/Patient Goals at Discharge (Final Review):    ITP Comments:   Comments:

## 2016-11-07 ENCOUNTER — Encounter (HOSPITAL_COMMUNITY)
Admission: RE | Admit: 2016-11-07 | Discharge: 2016-11-07 | Disposition: A | Discharge status: Home / Self Care | Payer: Medicare Other | Source: Ambulatory Visit | Attending: Pulmonary Disease | Admitting: Pulmonary Disease

## 2016-11-07 VITALS — Wt 149.0 lb

## 2016-11-07 DIAGNOSIS — J849 Interstitial pulmonary disease, unspecified: Secondary | ICD-10-CM | POA: Diagnosis not present

## 2016-11-07 NOTE — Progress Notes (Signed)
Daily Session Note  Patient Details  Name: Catherine Parks MRN: [id number] Date of Birth: 11-14-1940 Referring Provider:   April Manson Pulmonary Rehab Walk Test from 10/31/2016 in [place]  Referring Provider  Dr. Ashok Cordia      Encounter Date: 11/07/2016  Check In:     Session Check In - 11/07/16 1233      Check-In   Location [facility]   Staff Present Barnet Pall, RN, BSN;Molly diVincenzo, MS, ACSM RCEP, Exercise Physiologist;Portia Rollene Rotunda, RN, BSN   Supervising physician immediately available to respond to emergencies [facility] immediately available   Physician(s) Dr. Pleas Patricia   Medication changes reported     No   Fall or balance concerns reported    No   Warm-up and Cool-down Performed as group-led instruction   Resistance Training Performed Yes   VAD Patient? No     Pain Assessment   Currently in Pain? No/denies   Multiple Pain Sites No      Capillary Blood Glucose: No results found for this or any previous visit (from the past 24 hour(s)).      Exercise Prescription Changes - 11/07/16 1200      Response to Exercise   Blood Pressure (Admit) 120/84   Blood Pressure (Exercise) 156/84   Blood Pressure (Exit) 108/66   Heart Rate (Admit) 79 bpm   Heart Rate (Exercise) 110 bpm   Heart Rate (Exit) 78 bpm   Oxygen Saturation (Admit) 100 %   Oxygen Saturation (Exercise) 100 %   Oxygen Saturation (Exit) 99 %   Rating of Perceived Exertion (Exercise) 15   Perceived Dyspnea (Exercise) 2   Duration Progress to 45 minutes of aerobic exercise without signs/symptoms of physical distress   Intensity Other (comment)     Progression   Progression --  40-80% of HRR     Resistance Training   Training Prescription Yes   Weight orange bands   Reps 10-12  10 mniutes of strength training     Bike   Level 0.5   Minutes 17     Track   Laps 16   Minutes 17     Goals Met:  Exercise tolerated well No report  of cardiac concerns or symptoms Strength training completed today  Goals Unmet:  Not Applicable  Comments: Service time is from 1030 to 1215    Dr. Rush Farmer is Medical Director for Pulmonary Rehab at [facility] [address]

## 2016-11-12 ENCOUNTER — Encounter (HOSPITAL_COMMUNITY)
Admission: RE | Admit: 2016-11-12 | Discharge: 2016-11-12 | Disposition: A | Discharge status: Home / Self Care | Payer: Medicare Other | Source: Ambulatory Visit | Attending: Pulmonary Disease | Admitting: Pulmonary Disease

## 2016-11-12 VITALS — Wt 148.6 lb

## 2016-11-12 DIAGNOSIS — J849 Interstitial pulmonary disease, unspecified: Secondary | ICD-10-CM | POA: Diagnosis not present

## 2016-11-12 NOTE — Progress Notes (Signed)
Daily Session Note  Patient Details  Name: Catherine Parks MRN: [id number] Date of Birth: May 06, 1941 Referring Provider:   April Manson Pulmonary Rehab Walk Test from 10/31/2016 in [place]  Referring Provider  Dr. Ashok Cordia      Encounter Date: 11/12/2016  Check In:     Session Check In - 11/12/16 1030      Check-In   Location [facility]   Staff Present Rosebud Poles, RN, BSN;Molly diVincenzo, MS, ACSM RCEP, Exercise Physiologist;Lisa Ysidro Evert, RN;Portia Rollene Rotunda, RN, BSN   Supervising physician immediately available to respond to emergencies [facility] immediately available   Physician(s) Dr. Posey Pronto   Medication changes reported     No   Fall or balance concerns reported    No   Tobacco Cessation No Change   Warm-up and Cool-down Performed as group-led instruction   Resistance Training Performed Yes   VAD Patient? No     Pain Assessment   Currently in Pain? No/denies   Multiple Pain Sites No      Capillary Blood Glucose: No results found for this or any previous visit (from the past 24 hour(s)).      Exercise Prescription Changes - 11/12/16 1200      Response to Exercise   Blood Pressure (Admit) 134/84   Blood Pressure (Exercise) 150/74   Blood Pressure (Exit) 104/72   Heart Rate (Admit) 71 bpm   Heart Rate (Exercise) 97 bpm   Heart Rate (Exit) 79 bpm   Oxygen Saturation (Admit) 100 %   Oxygen Saturation (Exercise) 99 %   Oxygen Saturation (Exit) 100 %   Rating of Perceived Exertion (Exercise) 15   Perceived Dyspnea (Exercise) 1   Duration Progress to 45 minutes of aerobic exercise without signs/symptoms of physical distress   Intensity THRR unchanged     Progression   Progression Continue to progress workloads to maintain intensity without signs/symptoms of physical distress.     Resistance Training   Training Prescription Yes   Weight orange bands   Reps 10-15   Time 10 Minutes     Bike   Level 0.5    Minutes 17     Arm Ergometer   Level 1   Minutes 17     Track   Laps 15   Minutes 17      History  Smoking Status  . Former Smoker  . Packs/day: 1.00  . Years: 58.00  . Types: Cigarettes  . Start date: 10/26/1956  . Quit date: 09/23/2014  Smokeless Tobacco  . Never Used    Comment: She only took a break for 2 months    Goals Met:  Exercise tolerated well Strength training completed today  Goals Unmet:  Not Applicable  Comments: Service time is from 1030 to [place]    Dr. Rush Farmer is Medical Director for Pulmonary Rehab at [facility].

## 2016-11-14 ENCOUNTER — Encounter (HOSPITAL_COMMUNITY)
Admission: RE | Admit: 2016-11-14 | Discharge: 2016-11-14 | Disposition: A | Discharge status: Home / Self Care | Payer: Medicare Other | Source: Ambulatory Visit | Attending: Pulmonary Disease | Admitting: Pulmonary Disease

## 2016-11-14 VITALS — Wt 146.8 lb

## 2016-11-14 DIAGNOSIS — R911 Solitary pulmonary nodule: Secondary | ICD-10-CM | POA: Insufficient documentation

## 2016-11-14 DIAGNOSIS — J849 Interstitial pulmonary disease, unspecified: Secondary | ICD-10-CM | POA: Insufficient documentation

## 2016-11-14 DIAGNOSIS — J449 Chronic obstructive pulmonary disease, unspecified: Secondary | ICD-10-CM | POA: Diagnosis not present

## 2016-11-14 NOTE — Progress Notes (Signed)
Daily Session Note  Patient Details  Name: Catherine Parks MRN: [id number] Date of Birth: Jun 20, 1941 Referring Provider:   April Manson Pulmonary Rehab Walk Test from 10/31/2016 in [place]  Referring Provider  Dr. Ashok Cordia      Encounter Date: 11/14/2016  Check In:     Session Check In - 11/14/16 1030      Check-In   [facility]   Staff Present Rosebud Poles, RN, BSN;Molly diVincenzo, MS, ACSM RCEP, Exercise Physiologist;Karanveer Ramakrishnan Ysidro Evert, RN;Portia Rollene Rotunda, RN, BSN   Supervising physician immediately available to respond to emergencies [facility] immediately available   Physician(s) Dr. Sloan Leiter   Medication changes reported     No   Fall or balance concerns reported    No   Tobacco Cessation No Change   Warm-up and Cool-down Performed as group-led instruction   Resistance Training Performed Yes   VAD Patient? No     Pain Assessment   Multiple Pain Sites No      Capillary Blood Glucose: No results found for this or any previous visit (from the past 24 hour(s)).      Exercise Prescription Changes - 11/14/16 1200      Response to Exercise   Blood Pressure (Admit) 116/62   Blood Pressure (Exercise) 134/74   Blood Pressure (Exit) 100/70   Heart Rate (Admit) 80 bpm   Heart Rate (Exercise) 98 bpm   Heart Rate (Exit) 75 bpm   Oxygen Saturation (Admit) 100 %   Oxygen Saturation (Exercise) 98 %   Oxygen Saturation (Exit) 100 %   Rating of Perceived Exertion (Exercise) 13   Perceived Dyspnea (Exercise) 0   Duration Progress to 45 minutes of aerobic exercise without signs/symptoms of physical distress   Intensity THRR unchanged     Progression   Progression Continue to progress workloads to maintain intensity without signs/symptoms of physical distress.     Resistance Training   Training Prescription Yes   Weight orange bands   Reps 10-15   Time 10 Minutes     NuStep   Level 2   Minutes 17   METs 1.8      Track   Laps 12   Minutes 17      History  Smoking Status  . Former Smoker  . Packs/day: 1.00  . Years: 58.00  . Types: Cigarettes  . Start date: 10/26/1956  . Quit date: 09/23/2014  Smokeless Tobacco  . Never Used    Comment: She only took a break for 2 months    Goals Met:  Exercise tolerated well No report of cardiac concerns or symptoms Strength training completed today  Goals Unmet:  Not Applicable  Comments: Service time is from 1030 to 1220    Dr. Rush Farmer is Medical Director for Pulmonary Rehab at [facility].

## 2016-11-19 ENCOUNTER — Telehealth: Payer: Self-pay | Admitting: Pulmonary Disease

## 2016-11-19 ENCOUNTER — Encounter (HOSPITAL_COMMUNITY)
Admission: RE | Admit: 2016-11-19 | Discharge: 2016-11-19 | Disposition: A | Discharge status: Home / Self Care | Payer: Medicare Other | Source: Ambulatory Visit | Attending: Pulmonary Disease | Admitting: Pulmonary Disease

## 2016-11-19 VITALS — Wt 150.4 lb

## 2016-11-19 DIAGNOSIS — J849 Interstitial pulmonary disease, unspecified: Secondary | ICD-10-CM | POA: Diagnosis not present

## 2016-11-19 NOTE — Progress Notes (Signed)
Daily Session Note  Patient Details  Name: Catherine Parks MRN: [id number] Date of Birth: June 15, 1941 Referring Provider:   April Manson Pulmonary Rehab Walk Test from 10/31/2016 in [place]  Referring Provider  Dr. Ashok Cordia      Encounter Date: 11/19/2016  Check In:     Session Check In - 11/19/16 1030      Check-In   Location [facility]   Staff Present Rosebud Poles, RN, BSN;Areal Cochrane, MS, ACSM RCEP, Exercise Physiologist;Lisa Ysidro Evert, RN;Portia Rollene Rotunda, RN, BSN   Supervising physician immediately available to respond to emergencies [facility] immediately available   Physician(s) Dr. Candiss Norse   Medication changes reported     No   Fall or balance concerns reported    No   Tobacco Cessation No Change   Warm-up and Cool-down Performed as group-led instruction   Resistance Training Performed Yes   VAD Patient? No     Pain Assessment   Currently in Pain? No/denies   Multiple Pain Sites No      Capillary Blood Glucose: No results found for this or any previous visit (from the past 24 hour(s)).      Exercise Prescription Changes - 11/19/16 1200      Response to Exercise   Blood Pressure (Admit) 130/70   Blood Pressure (Exercise) 124/60   Blood Pressure (Exit) 104/60   Heart Rate (Admit) 62 bpm   Heart Rate (Exercise) 129 bpm   Heart Rate (Exit) 69 bpm   Oxygen Saturation (Admit) 100 %   Oxygen Saturation (Exercise) 97 %   Oxygen Saturation (Exit) 99 %   Rating of Perceived Exertion (Exercise) 15   Perceived Dyspnea (Exercise) 1   Duration Progress to 45 minutes of aerobic exercise without signs/symptoms of physical distress   Intensity THRR unchanged     Progression   Progression Continue to progress workloads to maintain intensity without signs/symptoms of physical distress.     Resistance Training   Training Prescription Yes   Weight orange bands   Reps 10-15   Time 10 Minutes     Bike   Level 0.5    Minutes 17     NuStep   Level 4   Minutes 17   METs 2     Track   Laps 15   Minutes 17      History  Smoking Status  . Former Smoker  . Packs/day: 1.00  . Years: 58.00  . Types: Cigarettes  . Start date: 10/26/1956  . Quit date: 09/23/2014  Smokeless Tobacco  . Never Used    Comment: She only took a break for 2 months    Goals Met:  Exercise tolerated well No report of cardiac concerns or symptoms Strength training completed today  Goals Unmet:  Not Applicable  Comments: Service time is from 10:30a to 12:05p    Dr. Rush Farmer is Medical Director for Pulmonary Rehab at [facility].

## 2016-11-19 NOTE — Telephone Encounter (Signed)
Notes Recorded by Javier Glazier, MD on 11/18/2016 at 9:13 AM EST Please let the patient know that the nodule in her lung hasn't changed in size. We will discuss this more at her follow-up appointment. Thanks. --------------------------------------------- lmtcb x1 for pt.

## 2016-11-20 NOTE — Telephone Encounter (Signed)
Spoke with pt, aware of results/recs.  Nothing further needed.  

## 2016-11-21 ENCOUNTER — Encounter (HOSPITAL_COMMUNITY): Payer: Medicare Other

## 2016-11-26 ENCOUNTER — Encounter (HOSPITAL_COMMUNITY)
Admission: RE | Admit: 2016-11-26 | Discharge: 2016-11-26 | Disposition: A | Discharge status: Home / Self Care | Payer: Medicare Other | Source: Ambulatory Visit | Attending: Pulmonary Disease | Admitting: Pulmonary Disease

## 2016-11-26 VITALS — Wt 147.0 lb

## 2016-11-26 DIAGNOSIS — J849 Interstitial pulmonary disease, unspecified: Secondary | ICD-10-CM | POA: Diagnosis not present

## 2016-11-26 NOTE — Progress Notes (Signed)
Daily Session Note  Patient Details  Name: Catherine Parks MRN: [id number] Date of Birth: [birth year] Referring Provider:   April Manson Pulmonary Rehab Walk Test from 10/31/2016 in [place]  Referring Provider  Dr. Ashok Cordia      Encounter Date: 11/26/2016  Check In:     Session Check In - 11/26/16 1233      Check-In   Location [facility]   Staff Present Su Hilt, MS, ACSM RCEP, Exercise Physiologist;Olinty [place], MS, ACSM CEP, Exercise Physiologist;Maria Whitaker, RN, Roque Cash, RN   Supervising physician immediately available to respond to emergencies [facility] immediately available   Physician(s) Dr Maylene Roes   Medication changes reported     No   Fall or balance concerns reported    No   Tobacco Cessation No Change   Warm-up and Cool-down Performed as group-led instruction   Resistance Training Performed Yes   VAD Patient? No     Pain Assessment   Currently in Pain? No/denies   Multiple Pain Sites No      Capillary Blood Glucose: No results found for this or any previous visit (from the past 24 hour(s)).      Exercise Prescription Changes - 11/26/16 1200      Response to Exercise   Blood Pressure (Admit) 128/60   Blood Pressure (Exercise) 138/80   Blood Pressure (Exit) 110/74   Heart Rate (Admit) 74 bpm   Heart Rate (Exercise) 116 bpm   Heart Rate (Exit) 83 bpm   Oxygen Saturation (Admit) 100 %   Oxygen Saturation (Exercise) 96 %   Oxygen Saturation (Exit) 100 %   Rating of Perceived Exertion (Exercise) 13   Perceived Dyspnea (Exercise) 1   Duration Progress to 45 minutes of aerobic exercise without signs/symptoms of physical distress   Intensity THRR unchanged     Progression   Progression Continue to progress workloads to maintain intensity without signs/symptoms of physical distress.     Resistance Training   Training Prescription Yes   Weight orange bands   Reps 10-15   Time 10  Minutes     Bike   Level 0.5   Minutes 17     NuStep   Level 3   Minutes 17   METs 2.6     Track   Laps 16   Minutes 17      History  Smoking Status  . Former Smoker  . Packs/day: 1.00  . Years: 58.00  . Types: Cigarettes  . Start date: 10/26/1956  . Quit date: 09/23/2014  Smokeless Tobacco  . Never Used    Comment: She only took a break for 2 months    Goals Met:  Exercise tolerated well No report of cardiac concerns or symptoms Strength training completed today  Goals Unmet: NA   Comments: Service time is from 1030 to 1210    Dr. Rush Farmer is Medical Director for Pulmonary Rehab at [facility].

## 2016-11-26 NOTE — Progress Notes (Signed)
Pulmonary Individual Treatment Plan  Patient Details  Name: Catherine Parks MRN: [id number] Date of Birth: 05-07-1941 Referring Provider:   April Manson Pulmonary Rehab Walk Test from 10/31/2016 in [place]  Referring Provider  Dr. Ashok Cordia      Initial Encounter Date:  Flowsheet Row Pulmonary Rehab Walk Test from 10/31/2016 in [place]  Date  10/31/16  Referring Provider  Dr. Ashok Cordia      Visit Diagnosis: ILD (interstitial lung disease) ([place])  Patient's Home Medications on Admission:   Current Outpatient Prescriptions:  .  acetaminophen (TYLENOL) 500 MG tablet, Take 500 mg by mouth every 6 (six) hours as needed (pain)., Disp: , Rfl:  .  amLODipine (NORVASC) 10 MG tablet, TAKE 1 TABLET BY MOUTH EVERY DAY FOR BLOOD PRESSURE, Disp: 90 tablet, Rfl: 3 .  aspirin EC 81 MG tablet, Take 81 mg by mouth 2 (two) times a week.  , Disp: , Rfl:  .  Calcium Citrate (CAL-CITRATE PO), Take by mouth., Disp: , Rfl:  .  cholecalciferol (VITAMIN D) 1000 units tablet, Take 4,000 Units by mouth daily after lunch., Disp: , Rfl:  .  cloNIDine (CATAPRES) 0.2 MG tablet, TAKE 1 TABLET BY MOUTH TWICE A DAY FOR BLOOD PRESSURE, Disp: 180 tablet, Rfl: 1 .  Flaxseed, Linseed, (FLAX SEED OIL) 1000 MG CAPS, Take 1,000 mg by mouth daily after lunch. , Disp: , Rfl:  .  halobetasol (ULTRAVATE) 0.05 % cream, Apply topically 2 (two) times daily., Disp: 50 g, Rfl: 0 .  Magnesium 500 MG TABS, Take 500 mg by mouth., Disp: , Rfl:  .  Naphazoline HCl (CLEAR EYES OP), Place 1 drop into both eyes daily., Disp: , Rfl:  .  Psyllium (METAMUCIL PO), Take 5 mLs by mouth daily after lunch. Mix in 8 oz liquid and drink, Disp: , Rfl:  .  ranitidine (ZANTAC) 300 MG tablet, Take 300 mg by mouth at bedtime. PRN, Disp: , Rfl:  .  traMADol (ULTRAM) 50 MG tablet, Take 1 tablet (50 mg total) by mouth every 12 (twelve) hours as needed for moderate pain. 1 pill twice daily as  needed for pain, Disp: 60 tablet, Rfl: 0 .  triamterene-hydrochlorothiazide (MAXZIDE-25) 37.5-25 MG tablet, TAKE 1 TABLET BY MOUTH DAILY., Disp: 90 tablet, Rfl: 1  Past Medical History: Past Medical History:  Diagnosis Date  . Allergic rhinitis   . Arthritis   . Bronchiectasis ([place])   . Colon polyps   . Diverticulosis   . Emphysema of lung ([place])   . GERD (gastroesophageal reflux disease)   . HSV-1 (herpes simplex virus 1) infection   . Hypertension     Tobacco Use: History  Smoking Status  . Former Smoker  . Packs/day: 1.00  . Years: 58.00  . Types: Cigarettes  . Start date: 10/26/1956  . Quit date: 09/23/2014  Smokeless Tobacco  . Never Used    Comment: She only took a break for 2 months    Labs: Recent Review Flowsheet Data    Labs for ITP Cardiac and Pulmonary Rehab Latest Ref Rng & Units 04/10/2015 08/04/2015 02/28/2016 06/07/2016 09/17/2016   Cholestrol <200 mg/dL 204(H) 197 221(H) 207(H) 211(H)   LDLCALC <100 mg/dL 102 108 132(H) 129 116(H)   HDL >50 mg/dL 85 73 73 65 76   Trlycerides <150 mg/dL 83 80 79 66 94   Hemoglobin A1c <5.7 % 6.0(H) 5.9(H) 6.1(H) 5.7(H) 5.6      Capillary Blood Glucose: Lab  Results  Component Value Date   GLUCAP 83 11/19/2015   GLUCAP 107 (H) 11/01/2014     ADL UCSD:     Pulmonary Assessment Scores    Row Name 10/29/16 1154         ADL UCSD   ADL Phase Entry     SOB Score total 50        Pulmonary Function Assessment:   Exercise Target Goals:    Exercise Program Goal: Individual exercise prescription set with THRR, safety & activity barriers. Participant demonstrates ability to understand and report RPE using BORG scale, to self-measure pulse accurately, and to acknowledge the importance of the exercise prescription.  Exercise Prescription Goal: Starting with aerobic activity 30 plus minutes a day, 3 days per week for initial exercise prescription. Provide home exercise prescription and guidelines that participant  acknowledges understanding prior to discharge.  Activity Barriers & Risk Stratification:     Activity Barriers & Cardiac Risk Stratification - 10/28/16 1015      Activity Barriers & Cardiac Risk Stratification   Activity Barriers Arthritis;Shortness of Breath      6 Minute Walk:     6 Minute Walk    Row Name 10/31/16 1513         6 Minute Walk   Phase Initial     Distance 1175 feet     Walk Time 6 minutes     # of Rest Breaks 0     MPH 2.22     METS 2.68     RPE 12     Perceived Dyspnea  3     Symptoms No     Resting HR 80 bpm     Resting BP 124/84     Max Ex. HR 105 bpm     Max Ex. BP 140/80       Interval HR   Baseline HR 80     1 Minute HR 95     2 Minute HR 85     3 Minute HR 92     4 Minute HR 94     5 Minute HR 105     6 Minute HR 104     2 Minute Post HR 95     Interval Heart Rate? Yes       Interval Oxygen   Interval Oxygen? Yes     Baseline Oxygen Saturation % 100 %     Baseline Liters of Oxygen 0 L     1 Minute Oxygen Saturation % 94 %     1 Minute Liters of Oxygen 0 L     2 Minute Oxygen Saturation % 85 %  questionable reading     2 Minute Liters of Oxygen 0 L     3 Minute Oxygen Saturation % 100 %     3 Minute Liters of Oxygen 0 L     4 Minute Oxygen Saturation % 95 %     4 Minute Liters of Oxygen 0 L     5 Minute Oxygen Saturation % 94 %     5 Minute Liters of Oxygen 0 L     6 Minute Oxygen Saturation % 94 %     6 Minute Liters of Oxygen 0 L     2 Minute Post Oxygen Saturation % 96 %     2 Minute Post Liters of Oxygen 0 L        Oxygen Initial Assessment:     Oxygen Initial Assessment - 11/25/16  [place] Oxygen Device None      Oxygen Re-Evaluation:   Oxygen Discharge (Final Oxygen Re-Evaluation):   Initial Exercise Prescription:     Initial Exercise Prescription - 10/31/16 1500      Date of Initial Exercise RX and Referring Provider   Date 10/31/16   Referring Provider Dr. Vevelyn Royals    Level 0.5   Minutes 17     NuStep   Level 2   Minutes 17   METs 1.5     Track   Laps 8   Minutes 17     Prescription Details   Frequency (times per week) 2   Duration Progress to 45 minutes of aerobic exercise without signs/symptoms of physical distress     Intensity   THRR 40-80% of Max Heartrate 58-115   Ratings of Perceived Exertion 11-13   Perceived Dyspnea 0-4     Progression   Progression Continue progressive overload as per policy without signs/symptoms or physical distress.     Resistance Training   Training Prescription Yes   Weight orange bands   Reps 10-12      Perform Capillary Blood Glucose checks as needed.  Exercise Prescription Changes:     Exercise Prescription Changes    Row Name 11/07/16 1200 11/12/16 1200 11/14/16 1200 11/19/16 1200 11/26/16 1200     Response to Exercise   Blood Pressure (Admit) 120/84 134/84 116/62 130/70 128/60   Blood Pressure (Exercise) 156/84 150/74 134/74 124/60 138/80   Blood Pressure (Exit) 108/66 104/72 100/70 104/60 110/74   Heart Rate (Admit) 79 bpm 71 bpm 80 bpm 62 bpm 74 bpm   Heart Rate (Exercise) 110 bpm 97 bpm 98 bpm 129 bpm 116 bpm   Heart Rate (Exit) 78 bpm 79 bpm 75 bpm 69 bpm 83 bpm   Oxygen Saturation (Admit) 100 % 100 % 100 % 100 % 100 %   Oxygen Saturation (Exercise) 100 % 99 % 98 % 97 % 96 %   Oxygen Saturation (Exit) 99 % 100 % 100 % 99 % 100 %   Rating of Perceived Exertion (Exercise) '15 15 13 15 13   ' Perceived Dyspnea (Exercise) 2 1 0 1 1   Duration Progress to 45 minutes of aerobic exercise without signs/symptoms of physical distress Progress to 45 minutes of aerobic exercise without signs/symptoms of physical distress Progress to 45 minutes of aerobic exercise without signs/symptoms of physical distress Progress to 45 minutes of aerobic exercise without signs/symptoms of physical distress Progress to 45 minutes of aerobic exercise without signs/symptoms of physical distress   Intensity Other  (comment) THRR unchanged THRR unchanged THRR unchanged THRR unchanged     Progression   Progression -  40-80% of HRR Continue to progress workloads to maintain intensity without signs/symptoms of physical distress. Continue to progress workloads to maintain intensity without signs/symptoms of physical distress. Continue to progress workloads to maintain intensity without signs/symptoms of physical distress. Continue to progress workloads to maintain intensity without signs/symptoms of physical distress.     Resistance Training   Training Prescription Yes Yes Yes Yes Yes   Weight orange bands orange bands orange bands orange bands orange bands   Reps 10-12  10 mniutes of strength training 10-15 10-15 10-15 10-15   Time  - 10 Minutes 10 Minutes 10 Minutes 10 Minutes     Bike   Level 0.5 0.5  - 0.5 0.5   Minutes 17  17  - 17 17     NuStep   Level  -  - '2 4 3   ' Minutes  -  - '17 17 17   ' METs  -  - 1.8 2 2.6     Arm Ergometer   Level  - 1  -  -  -   Minutes  - 17  -  -  -     Track   Laps '16 15 12 15 16   ' Minutes '17 17 17 17 17      ' Exercise Comments:   Exercise Goals and Review:   Exercise Goals Re-Evaluation :     Exercise Goals Re-Evaluation    Row Name 11/25/16 1618             Exercise Goal Re-Evaluation   Exercise Goals Review Increase Physical Activity;Increase Strenth and Stamina       Comments Patient has only attended four exercise sessions. Will cont. to monitor and progress.       Expected Outcomes Through the exercise here at rehab the patient with increase physical capacity, strength, and stamina.          Discharge Exercise Prescription (Final Exercise Prescription Changes):     Exercise Prescription Changes - 11/26/16 1200      Response to Exercise   Blood Pressure (Admit) 128/60   Blood Pressure (Exercise) 138/80   Blood Pressure (Exit) 110/74   Heart Rate (Admit) 74 bpm   Heart Rate (Exercise) 116 bpm   Heart Rate (Exit) 83 bpm   Oxygen  Saturation (Admit) 100 %   Oxygen Saturation (Exercise) 96 %   Oxygen Saturation (Exit) 100 %   Rating of Perceived Exertion (Exercise) 13   Perceived Dyspnea (Exercise) 1   Duration Progress to 45 minutes of aerobic exercise without signs/symptoms of physical distress   Intensity THRR unchanged     Progression   Progression Continue to progress workloads to maintain intensity without signs/symptoms of physical distress.     Resistance Training   Training Prescription Yes   Weight orange bands   Reps 10-15   Time 10 Minutes     Bike   Level 0.5   Minutes 17     NuStep   Level 3   Minutes 17   METs 2.6     Track   Laps 16   Minutes 17      Nutrition:  Target Goals: Understanding of nutrition guidelines, daily intake of sodium <1573m, cholesterol <2014m calories 30% from fat and 7% or less from saturated fats, daily to have 5 or more servings of fruits and vegetables.  Biometrics:     Pre Biometrics - 10/28/16 1028      Pre Biometrics   Grip Strength 20 kg       Nutrition Therapy Plan and Nutrition Goals:   Nutrition Discharge: Rate Your Plate Scores:   Nutrition Goals Re-Evaluation:   Nutrition Goals Discharge (Final Nutrition Goals Re-Evaluation):   Psychosocial: Target Goals: Acknowledge presence or absence of significant depression and/or stress, maximize coping skills, provide positive support system. Participant is able to verbalize types and ability to use techniques and skills needed for reducing stress and depression.  Initial Review & Psychosocial Screening:     Initial Psych Review & Screening - 10/28/16 [id number]     Barriers   Psychosocial barriers to participate in program There are no identifiable barriers or  psychosocial needs.;The patient should benefit from training in stress management and relaxation.     Screening Interventions   Interventions Encouraged to exercise      Quality  of Life Scores:     Quality of Life - 10/29/16 1154      Quality of Life Scores   Health/Function Pre 14.29 %   Socioeconomic Pre 14.07 %   Psych/Spiritual Pre 18.29 %   Family Pre 10.17 %   GLOBAL Pre 14.74 %      PHQ-9: Recent Review Flowsheet Data    Depression screen [facility] 2/9 10/28/2016 09/17/2016 09/14/2016 11/22/2015 08/04/2015   Decreased Interest 1 0 0 0 0   Down, Depressed, Hopeless 0 0 0 0 0   PHQ - 2 Score 1 0 0 0 0     Interpretation of Total Score  Total Score Depression Severity:  1-4 = Minimal depression, 5-9 = Mild depression, 10-14 = Moderate depression, 15-19 = Moderately severe depression, 20-27 = Severe depression   Psychosocial Evaluation and Intervention:     Psychosocial Evaluation - 10/28/16 1035      Psychosocial Evaluation & Interventions   Interventions Encouraged to exercise with the program and follow exercise prescription      Psychosocial Re-Evaluation:     Psychosocial Re-Evaluation    [place] Name 11/25/16 1059             Psychosocial Re-Evaluation   Current issues with None Identified       Interventions Encouraged to attend Pulmonary Rehabilitation for the exercise       Continue Psychosocial Services  No Follow up required          Psychosocial Discharge (Final Psychosocial Re-Evaluation):     Psychosocial Re-Evaluation - 11/25/16 1059      Psychosocial Re-Evaluation   Current issues with None Identified   Interventions Encouraged to attend Pulmonary Rehabilitation for the exercise   Continue Psychosocial Services  No Follow up required      Education: Education Goals: Education classes will be provided on a weekly basis, covering required topics. Participant will state understanding/return demonstration of topics presented.  Learning Barriers/Preferences:     Learning Barriers/Preferences - 10/28/16 1025      Learning Barriers/Preferences   Learning Barriers None   Learning Preferences Group Instruction;Individual  Instruction;Computer/Internet;Verbal Instruction;Written Material      Education Topics: Risk Factor Reduction:  -Group instruction that is supported by a PowerPoint presentation. Instructor discusses the definition of a risk factor, different risk factors for pulmonary disease, and how the heart and lungs work together.     Nutrition for Pulmonary Patient:  -Group instruction provided by PowerPoint slides, verbal discussion, and written materials to support subject matter. The instructor gives an explanation and review of healthy diet recommendations, which includes a discussion on weight management, recommendations for fruit and vegetable consumption, as well as protein, fluid, caffeine, fiber, sodium, sugar, and alcohol. Tips for eating when patients are short of breath are discussed.   Pursed Lip Breathing:  -Group instruction that is supported by demonstration and informational handouts. Instructor discusses the benefits of pursed lip and diaphragmatic breathing and detailed demonstration on how to preform both.     Oxygen Safety:  -Group instruction provided by PowerPoint, verbal discussion, and written material to support subject matter. There is an overview of "What is Oxygen" and "Why do we need it".  Instructor also reviews how to create a safe environment for oxygen use, the importance of using oxygen as prescribed, and the  risks of noncompliance. There is a brief discussion on traveling with oxygen and resources the patient may utilize.   Oxygen Equipment:  -Group instruction provided by [facility] Staff utilizing handouts, written materials, and equipment demonstrations.   Signs and Symptoms:  -Group instruction provided by written material and verbal discussion to support subject matter. Warning signs and symptoms of infection, stroke, and heart attack are reviewed and when to call the physician/911 reinforced. Tips for preventing the spread of infection  discussed.   Advanced Directives:  -Group instruction provided by verbal instruction and written material to support subject matter. Instructor reviews Advanced Directive laws and proper instruction for filling out document.   Pulmonary Video:  -Group video education that reviews the importance of medication and oxygen compliance, exercise, good nutrition, pulmonary hygiene, and pursed lip and diaphragmatic breathing for the pulmonary patient. Flowsheet Row PULMONARY REHAB OTHER RESPIRATORY from 11/14/2016 in [place]  Date  11/07/16  Instruction Review Code  2- meets goals/outcomes      Exercise for the Pulmonary Patient:  -Group instruction that is supported by a PowerPoint presentation. Instructor discusses benefits of exercise, core components of exercise, frequency, duration, and intensity of an exercise routine, importance of utilizing pulse oximetry during exercise, safety while exercising, and options of places to exercise outside of rehab.     Pulmonary Medications:  -Verbally interactive group education provided by instructor with focus on inhaled medications and proper administration.   Anatomy and Physiology of the Respiratory System and Intimacy:  -Group instruction provided by PowerPoint, verbal discussion, and written material to support subject matter. Instructor reviews respiratory cycle and anatomical components of the respiratory system and their functions. Instructor also reviews differences in obstructive and restrictive respiratory diseases with examples of each. Intimacy, Sex, and Sexuality differences are reviewed with a discussion on how relationships can change when diagnosed with pulmonary disease. Common sexual concerns are reviewed.   Knowledge Questionnaire Score:     Knowledge Questionnaire Score - 10/29/16 1154      Knowledge Questionnaire Score   Pre Score 12/13      Core Components/Risk Factors/Patient Goals at  Admission:     Personal Goals and Risk Factors at Admission - 10/28/16 1030      Core Components/Risk Factors/Patient Goals on Admission   Increase Strength and Stamina Yes   Improve shortness of breath with ADL's Yes   Intervention Provide education, individualized exercise plan and daily activity instruction to help decrease symptoms of SOB with activities of daily living.   Expected Outcomes Short Term: Achieves a reduction of symptoms when performing activities of daily living.   Develop more efficient breathing techniques such as purse lipped breathing and diaphragmatic breathing; and practicing self-pacing with activity Yes   Intervention Provide education, demonstration and support about specific breathing techniuqes utilized for more efficient breathing. Include techniques such as pursed lipped breathing, diaphragmatic breathing and self-pacing activity.   Expected Outcomes Short Term: Participant will be able to demonstrate and use breathing techniques as needed throughout daily activities.   Hypertension Yes   Intervention Provide education on lifestyle modifcations including regular physical activity/exercise, weight management, moderate sodium restriction and increased consumption of fresh fruit, vegetables, and low fat dairy, alcohol moderation, and smoking cessation.;Monitor prescription use compliance.   Expected Outcomes Short Term: Continued assessment and intervention until BP is < 140/80m HG in hypertensive participants. < 130/849mHG in hypertensive participants with diabetes, heart failure or chronic kidney disease.;Long Term: Maintenance of blood pressure  at goal levels.      Core Components/Risk Factors/Patient Goals Review:      Goals and Risk Factor Review    Row Name 11/25/16 1056             Core Components/Risk Factors/Patient Goals Review   Personal Goals Review Improve shortness of breath with ADL's;Develop more efficient breathing techniques such as purse  lipped breathing and diaphragmatic breathing and practicing self-pacing with activity.;Hypertension       Review see comments section on ITP       Expected Outcomes see admission expected outcomes on ITP          Core Components/Risk Factors/Patient Goals at Discharge (Final Review):      Goals and Risk Factor Review - 11/25/16 1056      Core Components/Risk Factors/Patient Goals Review   Personal Goals Review Improve shortness of breath with ADL's;Develop more efficient breathing techniques such as purse lipped breathing and diaphragmatic breathing and practicing self-pacing with activity.;Hypertension   Review see comments section on ITP   Expected Outcomes see admission expected outcomes on ITP      ITP Comments:   Comments: ITP REVIEW Pt is making expected progress toward pulmonary rehab goals after completing 5 sessions. Her blood pressure is staying within the parameters for exercise and she is monitoring her pressures at home. Goal is 130/80 or less. She states her stamina and strength are beginning to improve. She is tolerating workload increases.Recommend continued exercise, life style modification, education, and utilization of breathing techniques to increase stamina and strength and decrease shortness of breath with exertion.

## 2016-11-28 ENCOUNTER — Encounter (HOSPITAL_COMMUNITY)
Admission: RE | Admit: 2016-11-28 | Discharge: 2016-11-28 | Disposition: A | Discharge status: Home / Self Care | Payer: Medicare Other | Source: Ambulatory Visit | Attending: Pulmonary Disease | Admitting: Pulmonary Disease

## 2016-11-28 VITALS — Wt 148.1 lb

## 2016-11-28 DIAGNOSIS — J849 Interstitial pulmonary disease, unspecified: Secondary | ICD-10-CM | POA: Diagnosis not present

## 2016-11-28 NOTE — Progress Notes (Signed)
Daily Session Note  Patient Details  Name: Catherine Parks MRN: [id number] Date of Birth: [birth year] Referring Provider:     Pulmonary Rehab Walk Test from 10/31/2016 in [place]  Referring Provider  Dr. Ashok Cordia      Encounter Date: 11/28/2016  Check In:     Session Check In - 11/28/16 1030      Check-In   Location [facility]   Staff Present Su Hilt, MS, ACSM RCEP, Exercise Physiologist;Joan Leonia Reeves, RN, BSN;Dorma Altman, RN;Portia Rollene Rotunda, RN, BSN   Supervising physician immediately available to respond to emergencies [facility] immediately available   Physician(s) Dr. Sloan Leiter   Medication changes reported     No   Fall or balance concerns reported    No   Tobacco Cessation No Change   Warm-up and Cool-down Performed as group-led instruction   Resistance Training Performed Yes   VAD Patient? No     Pain Assessment   Currently in Pain? No/denies   Multiple Pain Sites No      Capillary Blood Glucose: No results found for this or any previous visit (from the past 24 hour(s)).      Exercise Prescription Changes - 11/28/16 1300      Response to Exercise   Blood Pressure (Admit) 118/64   Blood Pressure (Exercise) 130/78   Blood Pressure (Exit) 98/64   Heart Rate (Admit) 70 bpm   Heart Rate (Exercise) 100 bpm   Heart Rate (Exit) 82 bpm   Oxygen Saturation (Admit) 100 %   Oxygen Saturation (Exercise) 99 %   Oxygen Saturation (Exit) 97 %   Rating of Perceived Exertion (Exercise) 13   Perceived Dyspnea (Exercise) 1   Duration Progress to 45 minutes of aerobic exercise without signs/symptoms of physical distress   Intensity THRR unchanged     Progression   Progression Continue to progress workloads to maintain intensity without signs/symptoms of physical distress.     Resistance Training   Training Prescription Yes   Weight orange bands   Reps 10-15   Time 10 Minutes     Bike   Level 0.5   Minutes  17     NuStep   Level 3   Minutes 17   METs 2.5      History  Smoking Status  . Former Smoker  . Packs/day: 1.00  . Years: 58.00  . Types: Cigarettes  . Start date: 10/26/1956  . Quit date: 09/23/2014  Smokeless Tobacco  . Never Used    Comment: She only took a break for 2 months    Goals Met:  Exercise tolerated well No report of cardiac concerns or symptoms Strength training completed today  Goals Unmet:  Not Applicable  Comments: Service time is from 1030 to Bren[place]    Dr. Rush Farmer is Medical Director for Pulmonary Rehab at [facility].

## 2016-12-03 ENCOUNTER — Encounter (HOSPITAL_COMMUNITY)
Admission: RE | Admit: 2016-12-03 | Discharge: 2016-12-03 | Disposition: A | Discharge status: Home / Self Care | Payer: Medicare Other | Source: Ambulatory Visit | Attending: Pulmonary Disease | Admitting: Pulmonary Disease

## 2016-12-03 VITALS — Wt 149.0 lb

## 2016-12-03 DIAGNOSIS — J849 Interstitial pulmonary disease, unspecified: Secondary | ICD-10-CM | POA: Diagnosis not present

## 2016-12-03 NOTE — Progress Notes (Signed)
Daily Session Note  Patient Details  Name: Catherine Parks MRN: [id number] Date of Birth: 06/14/1941 Referring Provider:     Pulmonary Rehab Walk Test from 10/31/2016 in [facility]  Referring Provider  Dr. Nestor      Encounter Date: 12/03/2016  Check In:     Session Check In - 12/03/16 1212      Check-In   [facility]   Staff Present Molly diVincenzo, MS, ACSM RCEP, Exercise Physiologist;Joan Behrens, RN, BSN;Lisa Hughes, RN;Portia Payne, RN, BSN   Supervising physician immediately available to respond to emergencies [facility] immediately available   Physician(s) Dr. Singh   Medication changes reported     No   Fall or balance concerns reported    No   Tobacco Cessation No Change   Warm-up and Cool-down Performed as group-led instruction   Resistance Training Performed Yes   VAD Patient? No     Pain Assessment   Currently in Pain? No/denies   Multiple Pain Sites No      Capillary Blood Glucose: No results found for this or any previous visit (from the past 24 hour(s)).      Exercise Prescription Changes - 12/03/16 1200      Response to Exercise   Blood Pressure (Admit) 106/66   Blood Pressure (Exercise) 120/70   Blood Pressure (Exit) 100/62   Heart Rate (Admit) 74 bpm   Heart Rate (Exercise) 100 bpm   Heart Rate (Exit) 70 bpm   Oxygen Saturation (Admit) 98 %   Oxygen Saturation (Exercise) 98 %   Oxygen Saturation (Exit) 99 %   Rating of Perceived Exertion (Exercise) 13   Perceived Dyspnea (Exercise) 1   Duration Progress to 45 minutes of aerobic exercise without signs/symptoms of physical distress   Intensity THRR unchanged     Progression   Progression Continue to progress workloads to maintain intensity without signs/symptoms of physical distress.     Resistance Training   Training Prescription Yes   Weight orange bands   Reps 10-15   Time 10 Minutes     Bike   Level 3   Minutes 17      NuStep   Level 4   Minutes 17   METs 2.1     Track   Laps 16   Minutes 17      History  Smoking Status  . Former Smoker  . Packs/day: 1.00  . Years: 58.00  . Types: Cigarettes  . Start date: 10/26/1956  . Quit date: 09/23/2014  Smokeless Tobacco  . Never Used    Comment: She only took a break for 2 months    Goals Met:  Exercise tolerated well No report of cardiac concerns or symptoms Strength training completed today  Goals Unmet:  Not Applicable  Comments: Service time is from 10:30a to 12:00p    Dr. Wesam G. Yacoub is Medical Director for Pulmonary Rehab at  Hospital. 

## 2016-12-05 ENCOUNTER — Encounter (HOSPITAL_COMMUNITY)
Admission: RE | Admit: 2016-12-05 | Discharge: 2016-12-05 | Disposition: A | Discharge status: Home / Self Care | Payer: Medicare Other | Source: Ambulatory Visit | Attending: Pulmonary Disease | Admitting: Pulmonary Disease

## 2016-12-05 VITALS — Wt 147.9 lb

## 2016-12-05 DIAGNOSIS — J849 Interstitial pulmonary disease, unspecified: Secondary | ICD-10-CM

## 2016-12-05 NOTE — Progress Notes (Signed)
Daily Session Note  Patient Details  Name: Catherine Parks MRN: [id number] Date of Birth: 03-Sep-1941 Referring Provider:     Pulmonary Rehab Walk Test from 10/31/2016 in [place]  Referring Provider  Dr. Ashok Cordia      Encounter Date: 12/05/2016  Check In:     Session Check In - 12/05/16 1030      Check-In   [facility]   Staff Present Su Hilt, MS, ACSM RCEP, Exercise Physiologist;Lisa Ysidro Evert, RN;Portia Rollene Rotunda, RN, BSN   Supervising physician immediately available to respond to emergencies [facility] immediately available   Physician(s) Dr. Allyson Sabal   Medication changes reported     No   Fall or balance concerns reported    No   Tobacco Cessation No Change   Warm-up and Cool-down Performed as group-led instruction   Resistance Training Performed Yes   VAD Patient? No     Pain Assessment   Currently in Pain? No/denies   Multiple Pain Sites No      Capillary Blood Glucose: No results found for this or any previous visit (from the past 24 hour(s)).      Exercise Prescription Changes - 12/05/16 1200      Response to Exercise   Blood Pressure (Admit) 144/68   Blood Pressure (Exercise) 130/70   Blood Pressure (Exit) 98/64   Heart Rate (Admit) 64 bpm   Heart Rate (Exercise) 92 bpm   Heart Rate (Exit) 71 bpm   Oxygen Saturation (Admit) 100 %   Oxygen Saturation (Exercise) 99 %   Oxygen Saturation (Exit) 100 %   Rating of Perceived Exertion (Exercise) 13   Perceived Dyspnea (Exercise) 1   Duration Progress to 45 minutes of aerobic exercise without signs/symptoms of physical distress   Intensity THRR unchanged     Progression   Progression Continue to progress workloads to maintain intensity without signs/symptoms of physical distress.     Resistance Training   Training Prescription Yes   Weight orange bands   Reps 10-15   Time 10 Minutes     NuStep   Level 4   Minutes 17   METs 2.7     Track   Laps 14   Minutes 17      History  Smoking Status  . Former Smoker  . Packs/day: 1.00  . Years: 58.00  . Types: Cigarettes  . Start date: 10/26/1956  . Quit date: 09/23/2014  Smokeless Tobacco  . Never Used    Comment: She only took a break for 2 months    Goals Met:  Exercise tolerated well No report of cardiac concerns or symptoms Strength training completed today  Goals Unmet:  Not Applicable  Comments: Service time is from 10:30a to 12:20p    Dr. Rush Farmer is Medical Director for Pulmonary Rehab at [facility].

## 2016-12-10 ENCOUNTER — Encounter (HOSPITAL_COMMUNITY)
Admission: RE | Admit: 2016-12-10 | Discharge: 2016-12-10 | Disposition: A | Discharge status: Home / Self Care | Payer: Medicare Other | Source: Ambulatory Visit | Attending: Pulmonary Disease | Admitting: Pulmonary Disease

## 2016-12-10 VITALS — Wt 148.1 lb

## 2016-12-10 DIAGNOSIS — J849 Interstitial pulmonary disease, unspecified: Secondary | ICD-10-CM | POA: Diagnosis not present

## 2016-12-10 NOTE — Progress Notes (Signed)
Daily Session Note  Patient Details  Name: Catherine Parks MRN: [id number] Date of Birth: 12-10-1940 Referring Provider:     Pulmonary Rehab Walk Test from 10/31/2016 in [place]  Referring Provider  Dr. Ashok Cordia      Encounter Date: 12/10/2016  Check In:     Session Check In - 12/10/16 1030      Check-In   Location [facility]   Staff Present Rosebud Poles, RN, BSN;Fitzhugh Vizcarrondo, MS, ACSM RCEP, Exercise Physiologist;Annedrea Rosezella [place], RN, MHA;Portia Rollene Rotunda, RN, BSN   Supervising physician immediately available to respond to emergencies [facility] immediately available   Physician(s) Dr. Tana Coast   Medication changes reported     No   Fall or balance concerns reported    No   Tobacco Cessation No Change   Warm-up and Cool-down Performed as group-led instruction   Resistance Training Performed Yes   VAD Patient? No     Pain Assessment   Currently in Pain? No/denies   Multiple Pain Sites No      Capillary Blood Glucose: No results found for this or any previous visit (from the past 24 hour(s)).      Exercise Prescription Changes - 12/10/16 1200      Response to Exercise   Blood Pressure (Admit) 108/88   Blood Pressure (Exercise) 110/60   Blood Pressure (Exit) 116/62   Heart Rate (Admit) 69 bpm   Heart Rate (Exercise) 106 bpm   Heart Rate (Exit) 68 bpm   Oxygen Saturation (Admit) 100 %   Oxygen Saturation (Exercise) 95 %   Oxygen Saturation (Exit) 100 %   Rating of Perceived Exertion (Exercise) 13   Perceived Dyspnea (Exercise) 1   Duration Progress to 45 minutes of aerobic exercise without signs/symptoms of physical distress   Intensity THRR unchanged     Progression   Progression Continue to progress workloads to maintain intensity without signs/symptoms of physical distress.     Resistance Training   Training Prescription Yes   Weight orange bands   Reps 10-15   Time 10 Minutes     Bike   Level 3    Minutes 17     NuStep   Level 4   Minutes 17   METs 2.9     Track   Laps 12   Minutes 17      History  Smoking Status  . Former Smoker  . Packs/day: 1.00  . Years: 58.00  . Types: Cigarettes  . Start date: 10/26/1956  . Quit date: 09/23/2014  Smokeless Tobacco  . Never Used    Comment: She only took a break for 2 months    Goals Met:  Exercise tolerated well No report of cardiac concerns or symptoms Strength training completed today  Goals Unmet:  Not Applicable  Comments: Service time is from 10:30a to 12:05p    Dr. Rush Farmer is Medical Director for Pulmonary Rehab at [facility].

## 2016-12-10 NOTE — Progress Notes (Signed)
Catherine Parks 76 y.o. female Nutrition Note Spoke with pt. Pt eats 2 meals a day. There are some ways the pt can make her eating habits healthier. Pt had some misconceptions re: diet (e.g. Pt stopped eating Salmon because of her cholesterol). Pt's Rate Your Plate results reviewed with pt. Pt avoids some salty food; sometimes uses canned soups. The role of sodium in lung disease reviewed with pt. Pt expressed understanding of the information reviewed via feedback method.    Lab Results  Component Value Date   HGBA1C 5.6 09/17/2016   Nutrition Diagnosis ? Food-and nutrition-related knowledge deficit related to lack of exposure to information as related to diagnosis of pulmonary disease Nutrition Intervention ? Pt's individual nutrition plan and goals reviewed with pt. ? Benefits of adopting healthy eating habits discussed when pt's Rate Your Plate reviewed. ? Pt to attend the Nutrition and Lung Disease class ? Continual client-centered nutrition education by RD, as part of interdisciplinary care. Goal(s) 1. Pt to increase consumption of fish, especially fish high in omega 3 fats such as salmon, tuna, sardines, to 2-3 times per week. Monitor and Evaluate progress toward nutrition goal with team.   Derek Mound, M.Ed, RD, LDN, CDE 12/10/2016 12:06 PM

## 2016-12-12 ENCOUNTER — Encounter (HOSPITAL_COMMUNITY)
Admission: RE | Admit: 2016-12-12 | Discharge: 2016-12-12 | Disposition: A | Discharge status: Home / Self Care | Payer: Medicare Other | Source: Ambulatory Visit | Attending: Pulmonary Disease | Admitting: Pulmonary Disease

## 2016-12-12 VITALS — Wt 146.4 lb

## 2016-12-12 DIAGNOSIS — J849 Interstitial pulmonary disease, unspecified: Secondary | ICD-10-CM | POA: Diagnosis not present

## 2016-12-12 NOTE — Progress Notes (Signed)
Daily Session Note  Patient Details  Name: Catherine Parks MRN: [id number] Date of Birth: 07-02-1941 Referring Provider:     Pulmonary Rehab Walk Test from 10/31/2016 in [place]  Referring Provider  Dr. Ashok Cordia      Encounter Date: 12/12/2016  Check In:     Session Check In - 12/12/16 1022      Check-In   Location [facility]   Staff Present Rosebud Poles, RN, BSN;Devonte Migues Ysidro Evert, RN;Molly diVincenzo, MS, ACSM RCEP, Exercise Physiologist   Supervising physician immediately available to respond to emergencies [facility] immediately available   Physician(s) Dr. Sloan Leiter   Medication changes reported     No   Fall or balance concerns reported    No   Tobacco Cessation No Change   Warm-up and Cool-down Performed as group-led instruction   Resistance Training Performed Yes   VAD Patient? No     Pain Assessment   Currently in Pain? No/denies   Multiple Pain Sites No      Capillary Blood Glucose: No results found for this or any previous visit (from the past 24 hour(s)).      Exercise Prescription Changes - 12/12/16 1300      Response to Exercise   Blood Pressure (Admit) 116/64   Blood Pressure (Exercise) 120/70   Blood Pressure (Exit) 110/60   Heart Rate (Admit) 77 bpm   Heart Rate (Exercise) 90 bpm   Heart Rate (Exit) 68 bpm   Oxygen Saturation (Admit) 98 %   Oxygen Saturation (Exercise) 99 %   Oxygen Saturation (Exit) 100 %   Rating of Perceived Exertion (Exercise) 13   Perceived Dyspnea (Exercise) 1   Duration Progress to 45 minutes of aerobic exercise without signs/symptoms of physical distress   Intensity THRR unchanged     Progression   Progression Continue to progress workloads to maintain intensity without signs/symptoms of physical distress.     Resistance Training   Training Prescription Yes   Weight orange bands   Reps 10-15   Time 10 Minutes     NuStep   Level 4   Minutes 17   METs 2.9     Track   Laps 16   Minutes 17      History  Smoking Status  . Former Smoker  . Packs/day: 1.00  . Years: 58.00  . Types: Cigarettes  . Start date: 10/26/1956  . Quit date: 09/23/2014  Smokeless Tobacco  . Never Used    Comment: She only took a break for 2 months    Goals Met:  Exercise tolerated well No report of cardiac concerns or symptoms Strength training completed today  Goals Unmet:  Not Applicable  Comments: Service time is from 1030 to 1230    Dr. Rush Farmer is Medical Director for Pulmonary Rehab at [facility].

## 2016-12-17 ENCOUNTER — Encounter (HOSPITAL_COMMUNITY)
Admission: RE | Admit: 2016-12-17 | Discharge: 2016-12-17 | Disposition: A | Discharge status: Home / Self Care | Payer: Medicare Other | Source: Ambulatory Visit | Attending: Pulmonary Disease | Admitting: Pulmonary Disease

## 2016-12-17 VITALS — Wt 147.7 lb

## 2016-12-17 DIAGNOSIS — R911 Solitary pulmonary nodule: Secondary | ICD-10-CM | POA: Diagnosis not present

## 2016-12-17 DIAGNOSIS — J849 Interstitial pulmonary disease, unspecified: Secondary | ICD-10-CM | POA: Insufficient documentation

## 2016-12-17 DIAGNOSIS — J449 Chronic obstructive pulmonary disease, unspecified: Secondary | ICD-10-CM | POA: Diagnosis not present

## 2016-12-17 NOTE — Progress Notes (Signed)
Daily Session Note  Patient Details  Name: Catherine Parks MRN: [id number] Date of Birth: 02-28-1941 Referring Provider:     Pulmonary Rehab Walk Test from 10/31/2016 in [place]  Referring Provider  Dr. Ashok Cordia      Encounter Date: 12/17/2016  Check In:     Session Check In - 12/17/16 1052      Check-In   Location [facility]   Staff Present Su Hilt, MS, ACSM RCEP, Exercise Physiologist;Lisa Ysidro Evert, RN;Portia [place], RN, BSN;Joann Rion, RN, BSN   Supervising physician immediately available to respond to emergencies [facility] immediately available   Physician(s) Dr. Doyle Askew   Medication changes reported     No   Fall or balance concerns reported    No   Tobacco Cessation No Change   Warm-up and Cool-down Performed as group-led instruction   Resistance Training Performed Yes   VAD Patient? No     Pain Assessment   Currently in Pain? No/denies   Multiple Pain Sites No      Capillary Blood Glucose: No results found for this or any previous visit (from the past 24 hour(s)).      Exercise Prescription Changes - 12/17/16 1200      Response to Exercise   Blood Pressure (Admit) 128/68   Blood Pressure (Exercise) 120/70   Blood Pressure (Exit) 104/62   Heart Rate (Admit) 61 bpm   Heart Rate (Exercise) 100 bpm   Heart Rate (Exit) 75 bpm   Oxygen Saturation (Admit) 100 %   Oxygen Saturation (Exercise) 98 %   Oxygen Saturation (Exit) 97 %   Rating of Perceived Exertion (Exercise) 13   Perceived Dyspnea (Exercise) 1   Duration Progress to 45 minutes of aerobic exercise without signs/symptoms of physical distress   Intensity THRR unchanged     Progression   Progression Continue to progress workloads to maintain intensity without signs/symptoms of physical distress.     Resistance Training   Training Prescription Yes   Weight orange bands   Reps 10-15   Time 10 Minutes     Bike   Level 3   Minutes 17     NuStep   Level 5   Minutes 17   METs 2.6     Track   Laps 16   Minutes 17      History  Smoking Status  . Former Smoker  . Packs/day: 1.00  . Years: 58.00  . Types: Cigarettes  . Start date: 10/26/1956  . Quit date: 09/23/2014  Smokeless Tobacco  . Never Used    Comment: She only took a break for 2 months    Goals Met:  Exercise tolerated well No report of cardiac concerns or symptoms Strength training completed today  Goals Unmet:  Not Applicable  Comments: Service time is from 10:30a to 12:00p    Dr. Rush Farmer is Medical Director for Pulmonary Rehab at [facility].

## 2016-12-18 ENCOUNTER — Encounter: Payer: Self-pay | Admitting: Internal Medicine

## 2016-12-18 ENCOUNTER — Ambulatory Visit (INDEPENDENT_AMBULATORY_CARE_PROVIDER_SITE_OTHER): Payer: Medicare Other | Admitting: Internal Medicine

## 2016-12-18 VITALS — BP 126/70 | HR 68 | Temp 98.0°F | Resp 14 | Ht 67.25 in | Wt 146.0 lb

## 2016-12-18 DIAGNOSIS — N182 Chronic kidney disease, stage 2 (mild): Secondary | ICD-10-CM

## 2016-12-18 DIAGNOSIS — R300 Dysuria: Secondary | ICD-10-CM

## 2016-12-18 DIAGNOSIS — M1990 Unspecified osteoarthritis, unspecified site: Secondary | ICD-10-CM

## 2016-12-18 DIAGNOSIS — R7309 Other abnormal glucose: Secondary | ICD-10-CM

## 2016-12-18 DIAGNOSIS — K5791 Diverticulosis of intestine, part unspecified, without perforation or abscess with bleeding: Secondary | ICD-10-CM

## 2016-12-18 DIAGNOSIS — R1013 Epigastric pain: Secondary | ICD-10-CM

## 2016-12-18 DIAGNOSIS — E782 Mixed hyperlipidemia: Secondary | ICD-10-CM

## 2016-12-18 DIAGNOSIS — J479 Bronchiectasis, uncomplicated: Secondary | ICD-10-CM | POA: Diagnosis not present

## 2016-12-18 DIAGNOSIS — R6889 Other general symptoms and signs: Secondary | ICD-10-CM | POA: Diagnosis not present

## 2016-12-18 DIAGNOSIS — B009 Herpesviral infection, unspecified: Secondary | ICD-10-CM

## 2016-12-18 DIAGNOSIS — E559 Vitamin D deficiency, unspecified: Secondary | ICD-10-CM

## 2016-12-18 DIAGNOSIS — I10 Essential (primary) hypertension: Secondary | ICD-10-CM | POA: Diagnosis not present

## 2016-12-18 DIAGNOSIS — J302 Other seasonal allergic rhinitis: Secondary | ICD-10-CM | POA: Diagnosis not present

## 2016-12-18 DIAGNOSIS — Z6822 Body mass index (BMI) 22.0-22.9, adult: Secondary | ICD-10-CM | POA: Diagnosis not present

## 2016-12-18 DIAGNOSIS — J849 Interstitial pulmonary disease, unspecified: Secondary | ICD-10-CM

## 2016-12-18 DIAGNOSIS — Z79899 Other long term (current) drug therapy: Secondary | ICD-10-CM

## 2016-12-18 DIAGNOSIS — K219 Gastro-esophageal reflux disease without esophagitis: Secondary | ICD-10-CM

## 2016-12-18 DIAGNOSIS — J449 Chronic obstructive pulmonary disease, unspecified: Secondary | ICD-10-CM | POA: Diagnosis not present

## 2016-12-18 DIAGNOSIS — Z0000 Encounter for general adult medical examination without abnormal findings: Secondary | ICD-10-CM

## 2016-12-18 DIAGNOSIS — Z0001 Encounter for general adult medical examination with abnormal findings: Secondary | ICD-10-CM | POA: Diagnosis not present

## 2016-12-18 DIAGNOSIS — K635 Polyp of colon: Secondary | ICD-10-CM | POA: Diagnosis not present

## 2016-12-18 LAB — URINALYSIS, ROUTINE W REFLEX MICROSCOPIC
BILIRUBIN URINE: NEGATIVE
GLUCOSE, UA: NEGATIVE
Hgb urine dipstick: NEGATIVE
Ketones, ur: NEGATIVE
LEUKOCYTES UA: NEGATIVE
Nitrite: NEGATIVE
PH: 7.5 (ref 5.0–8.0)
Protein, ur: NEGATIVE
Specific Gravity, Urine: 1.013 (ref 1.001–1.035)

## 2016-12-18 MED ORDER — RANITIDINE HCL 300 MG PO TABS: 300.0000 mg | ORAL_TABLET | Freq: Every day | ORAL | 1 refills | Status: DC

## 2016-12-18 NOTE — Progress Notes (Signed)
MEDICARE ANNUAL WELLNESS VISIT AND FOLLOW UP Assessment:    1. Dysuria -yeast infection vs. UTI -will wait for results prior to ordering medication - Urinalysis, Routine w reflex microscopic - Urine culture  2. Essential hypertension -well controlled -dash diet -exercise as tolerated -monitor at home -cont meds  3. Bronchiectasis without complication ([place]) -followed by pulmonary  4. Chronic seasonal allergic rhinitis, unspecified trigger -cont allergy medications  5. COPD, mild ([place]) -followed by pulmonary  6. ILD (interstitial lung disease) ([place]) -followed by pulmonary  7. Polyp of colon, unspecified part of colon, unspecified type -seen on colonoscopy  8. Diverticulosis of intestine with bleeding, unspecified intestinal tract location -seen on colonoscopy  9. Gastroesophageal reflux disease, esophagitis presence not specified -avoid trigger foods -cont medications  10. Arthritis -tylenol   11. CKD Stage II (GFR 65 ml/min) -cont monitoring BMET  12. Body mass index (BMI) of 22.0-22.9 in adult -cont diet and exercise  13. Dyspepsia -likely related to GERD as seen above  14. HSV-1 (herpes simplex virus 1) infection -cont antiviral if outbreaks  15. Hyperlipidemia -cont diet and exercise -not at goal -given age not appropriate to start medication   16. Medicare annual wellness visit, subsequent -due next year  41. Medication management   18. Other abnormal glucose -cont diet and exercise  19. Vitamin D deficiency -cont Vit D    Over 30 minutes of exam, counseling, chart review, and critical decision making was performed  Future Appointments Date Time Provider [place]  12/19/2016 10:30 AM [facility]AD [facility] None  12/24/2016 10:30 AM [facility] UNDERGRAD [facility] None  12/26/2016 10:30 AM [facility] UNDERGRAD [facility] None  12/27/2016 9:00 AM [facility] 6 MINUTE WALK [facility] None  12/27/2016  10:00 AM [facility] PFT ROOM [facility] None  12/27/2016 10:30 AM Javier Glazier, MD [facility] None  12/31/2016 10:30 AM [facility] UNDERGRAD [facility] None  01/02/2017 10:30 AM [facility] UNDERGRAD [facility] None  01/07/2017 10:30 AM [facility] UNDERGRAD [facility] None  01/09/2017 10:30 AM [facility] UNDERGRAD [facility] None  01/14/2017 10:30 AM [facility] UNDERGRAD [facility] None  01/16/2017 10:30 AM [facility] UNDER[facility]AD [facility] None  01/21/2017 10:30 AM [facility] UNDERGRAD [facility] None  01/23/2017 10:30 AM [facility] UNDER[facility]AD [facility] None  01/28/2017 10:30 AM [facility] UNDER[facility]AD [facility] None  01/30/2017 10:30 AM [facility] UNDER[facility]AD [facility] None  02/04/2017 10:30 AM [facility] UNDER[facility]AD [facility] None  02/06/2017 10:30 AM [facility] UNDERGRAD [facility] None  02/11/2017 10:30 AM [facility] UNDER[facility]AD [facility] None  02/13/2017 10:30 AM [facility] UNDER[facility]AD [facility] None  02/18/2017 10:30 AM [facility] UNDER[facility]AD [facility] None  02/20/2017 10:30 AM [facility] UNDER[facility]AD [facility] None  02/25/2017 10:30 AM [facility] UNDER[facility]AD [facility] None  02/27/2017 10:30 AM [facility] UNDER[facility]AD [facility] None  03/04/2017 10:30 AM [facility] UND[facility]AD [facility] None  03/27/2017 10:30 AM Unk Pinto, MD [facility] None  10/17/2017 10:00 AM Unk Pinto, MD [facility] None     Plan:   During the course of the visit the patient was educated and counseled about appropriate screening and preventive services including:    Pneumococcal vaccine   Influenza vaccine  Prevnar 13  Td vaccine  Screening electrocardiogram  Colorectal cancer screening  Diabetes screening  Glaucoma screening  Nutrition counseling    Subjective:  Catherine Parks is a 75 y.o. female who presents for Medicare Annual Wellness Visit and 3 month follow up for HTN, hyperlipidemia,  prediabetes, and vitamin D Def.   Her blood pressure has  been controlled at home, today their BP is BP: 126/70 She does not workout. She denies chest pain, shortness of breath, dizziness.  She is on cholesterol medication and denies myalgias. Her cholesterol is at goal. The cholesterol last visit was:   Lab Results  Component Value Date   CHOL 211 (H) 09/17/2016   HDL 76 09/17/2016   LDLCALC 116 (H) 09/17/2016   TRIG 94 09/17/2016   CHOLHDL 2.8 09/17/2016   : She does have a historical diagnosis of prediabetes, but more recently A1c level has been well controlled. Lab Results  Component Value Date   HGBA1C 5.6 09/17/2016   Last GFR Lab Results  Component Value Date   GFRNONAA 67 09/17/2016     Lab Results  Component Value Date   GFRAA 78 09/17/2016   Patient is on Vitamin D supplement.   Lab Results  Component Value Date   VD25OH 61 09/17/2016     Patient reports that she is seeing Dr. Lucia Gaskins for tinnitus.  She reports that there was nothing that they can do.    She reports that she is following with Dr. Ashok Cordia for pulmonary scarring and shortness of breath.  He feels that her scarring is stable and she is doing pulmonary rehabilitation.  She is walking 16 laps per day.  She is getting increased stamina.    She has had some burning with urination.  She though that this was caused by her having a yeast infection.  She did not have vaginal discharge.  She is having urinary frequency and urgency.    Medication Review: Current Outpatient Prescriptions on File Prior to Visit  Medication Sig Dispense Refill  . acetaminophen (TYLENOL) 500 MG tablet Take 500 mg by mouth every 6 (six) hours as needed (pain).    Marland Kitchen amLODipine (NORVASC) 10 MG tablet TAKE 1 TABLET BY MOUTH EVERY DAY FOR BLOOD PRESSURE 90 tablet 3  . aspirin EC 81 MG tablet Take 81 mg by mouth 2 (two) times a week.      . Calcium Citrate (CAL-CITRATE PO) Take by mouth.    . cholecalciferol (VITAMIN D) 1000 units  tablet Take 4,000 Units by mouth daily after lunch.    . cloNIDine (CATAPRES) 0.2 MG tablet TAKE 1 TABLET BY MOUTH TWICE A DAY FOR BLOOD PRESSURE 180 tablet 1  . Flaxseed, Linseed, (FLAX SEED OIL) 1000 MG CAPS Take 1,000 mg by mouth daily after lunch.     . halobetasol (ULTRAVATE) 0.05 % cream Apply topically 2 (two) times daily. 50 g 0  . Magnesium 500 MG TABS Take 500 mg by mouth.    . Naphazoline HCl (CLEAR EYES OP) Place 1 drop into both eyes daily.    . Psyllium (METAMUCIL PO) Take 5 mLs by mouth daily after lunch. Mix in 8 oz liquid and drink    . traMADol (ULTRAM) 50 MG tablet Take 1 tablet (50 mg total) by mouth every 12 (twelve) hours as needed for moderate pain. 1 pill twice daily as needed for pain 60 tablet 0  . triamterene-hydrochlorothiazide (MAXZIDE-25) 37.5-25 MG tablet TAKE 1 TABLET BY MOUTH DAILY. 90 tablet 1   No current facility-administered medications on file prior to visit.     Allergies: Allergies  Allergen Reactions  . Lemon Oil Hives    Reaction to lemons  . Shellfish Allergy Swelling    Current Problems (verified) has Hypertension; HSV-1 (herpes simplex virus 1) infection; Diverticulosis; Colon polyps; Hyperlipidemia; Other abnormal glucose; Vitamin D deficiency; CKD Stage  II (GFR 65 ml/min); Medication management; GERD ; Body mass index (BMI) of 22.0-22.9 in adult; Medicare annual wellness visit, subsequent; COPD, mild ([place]); Pulmonary emphysema ([place]); Bronchiectasis without acute exacerbation ([place]); Dyspepsia; Arthritis; Chronic seasonal allergic rhinitis; and ILD (interstitial lung disease) ([place]) on her problem list.  Screening Tests Immunization History  Administered Date(s) Administered  . DTaP 07/30/2011  . Influenza, High Dose Seasonal PF 06/07/2016  . Pneumococcal Conjugate-13 11/22/2015  . Pneumococcal Polysaccharide-23 05/11/2013  . Pneumococcal-Unspecified 01/07/2002  . Td 01/07/2002, 05/07/2012    Preventative care: Last colonoscopy:  2012 Mammogram:2017 DEXA: 2017   Names of Other Physician/Practitioners you currently use: 1. [facility] here for primary care 2. [facility], eye doctor, last visit 2017 3. Dr. Desma Mcgregor, dentist, last visit 2018, new partial Patient Care Team: Unk Pinto, MD as PCP - General (Internal Medicine) Warden Fillers, MD as Consulting Physician (Optometry) Sanda Klein, MD as Consulting Physician (Cardiology) Sable Feil, MD as Consulting Physician (Gastroenterology) Mickle Plumb, MD (Gynecology) Druscilla Brownie, MD as Consulting Physician (Dermatology) Volanda Napoleon, MD as Consulting Physician (Oncology) Rozetta Nunnery, MD as Consulting Physician (Otolaryngology)  Surgical: She  has a past surgical history that includes Eye surgery; Abdominal hysterectomy; Colonoscopy; Direct laryngoscopy (N/A, 10/21/2014); Mass excision (Right, 10/21/2014); and Excision nasal mass (Right, 10/21/2014). Family Her family history includes Breast cancer in her maternal grandmother and sister; Cancer in her brother; Heart attack in her father; Hypertension in her mother; Prostate cancer in her brother; Stroke in her mother. Social history  She reports that she quit smoking about 2 years ago. Her smoking use included Cigarettes. She started smoking about 60 years ago. She has a 58.00 pack-year smoking history. She has never used smokeless tobacco. She reports that she does not drink alcohol or use drugs.  MEDICARE WELLNESS OBJECTIVES: Physical activity: Current Exercise Habits: Structured exercise class, Type of exercise: walking, Time (Minutes): 40, Frequency (Times/Week): 7, Weekly Exercise (Minutes/Week): 280, Intensity: Moderate Cardiac risk factors: Cardiac Risk Factors include: advanced age (>75men, >22 women);dyslipidemia;hypertension Depression/mood screen:   Depression screen [facility] 2/9 12/18/2016  Decreased Interest 0  Down, Depressed,  Hopeless 0  PHQ - 2 Score 0    ADLs:  In your present state of health, do you have any difficulty performing the following activities: 12/18/2016 09/17/2016  Hearing? N N  Vision? N N  Difficulty concentrating or making decisions? N N  Walking or climbing stairs? N N  Dressing or bathing? N N  Doing errands, shopping? N N  Preparing Food and eating ? N -  Using the Toilet? N -  In the past six months, have you accidently leaked urine? N -  Do you have problems with loss of bowel control? N -  Managing your Medications? N -  Managing your Finances? N -  Housekeeping or managing your Housekeeping? N -  Some recent data might be hidden     Cognitive Testing  Alert? Yes  Normal Appearance?Yes  Oriented to person? Yes  Place? Yes   Time? Yes  Recall of three objects?  Yes  Can perform simple calculations? Yes  Displays appropriate judgment?Yes  Can read the correct time from a watch face?Yes  EOL planning: Does Patient Have a Medical Advance Directive?: Yes Type of Advance Directive: Healthcare Power of Attorney, Living will Copy of [place] in Chart?: No - copy requested   Objective:   Today's Vitals   12/18/16 0938  BP: 126/70  Pulse: 68  Resp: 14  Temp: 98 F (36.7 C)  TempSrc: Temporal  SpO2: 98%  Weight: 146 lb (66.2 kg)  Height: 5' 7.25" (1.708 m)   Body mass index is 22.7 kg/m.  General appearance: alert, no distress, WD/WN, female HEENT: normocephalic, sclerae anicteric, TMs pearly, nares patent, no discharge or erythema, pharynx normal Oral cavity: MMM, no lesions Neck: supple, no lymphadenopathy, no thyromegaly, no masses Heart: RRR, normal S1, S2, no murmurs Lungs: CTA bilaterally, no wheezes, rhonchi, or rales Abdomen: +bs, soft, non tender, non distended, no masses, no hepatomegaly, no splenomegaly Musculoskeletal: nontender, no swelling, no obvious deformity Extremities: no edema, no cyanosis, no clubbing Pulses: 2+ symmetric,  upper and lower extremities, normal cap refill Neurological: alert, oriented x 3, CN2-12 intact, strength normal upper extremities and lower extremities, sensation normal throughout, DTRs 2+ throughout, no cerebellar signs, gait normal Psychiatric: normal affect, behavior normal, pleasant   Medicare Attestation I have personally reviewed: The patient's medical and social history Their use of alcohol, tobacco or illicit drugs Their current medications and supplements The patient's functional ability including ADLs,fall risks, home safety risks, cognitive, and hearing and visual impairment Diet and physical activities Evidence for depression or mood disorders  The patient's weight, height, BMI, and visual acuity have been recorded in the chart.  I have made referrals, counseling, and provided education to the patient based on review of the above and I have provided the patient with a written personalized care plan for preventive services.     [name], PA-C   12/18/2016

## 2016-12-19 ENCOUNTER — Encounter (HOSPITAL_COMMUNITY)
Admission: RE | Admit: 2016-12-19 | Discharge: 2016-12-19 | Disposition: A | Discharge status: Home / Self Care | Payer: Medicare Other | Source: Ambulatory Visit | Attending: Pulmonary Disease | Admitting: Pulmonary Disease

## 2016-12-19 VITALS — Wt 148.4 lb

## 2016-12-19 DIAGNOSIS — J849 Interstitial pulmonary disease, unspecified: Secondary | ICD-10-CM | POA: Diagnosis not present

## 2016-12-19 LAB — URINE CULTURE: Organism ID, Bacteria: NO GROWTH

## 2016-12-19 NOTE — Progress Notes (Signed)
Daily Session Note  Patient Details  Name: Catherine Parks MRN: [id number] Date of Birth: 08-Jun-1941 Referring Provider:     Pulmonary Rehab Walk Test from 10/31/2016 in [place]  Referring Provider  Dr. Ashok Cordia      Encounter Date: 12/19/2016  Check In:     Session Check In - 12/19/16 1055      Check-In   Location [facility]   Staff Present Su Hilt, MS, ACSM RCEP, Exercise Physiologist;Bartley Vuolo Ysidro Evert, RN;Portia Rollene Rotunda, RN, BSN   Supervising physician immediately available to respond to emergencies [facility] immediately available   Physician(s) Dr. Allyson Sabal   Medication changes reported     No   Fall or balance concerns reported    No   Tobacco Cessation No Change   Warm-up and Cool-down Performed as group-led instruction   Resistance Training Performed Yes   VAD Patient? No     Pain Assessment   Currently in Pain? No/denies   Multiple Pain Sites No      Capillary Blood Glucose: No results found for this or any previous visit (from the past 24 hour(s)).      Exercise Prescription Changes - 12/19/16 1200      Response to Exercise   Blood Pressure (Admit) 126/62   Blood Pressure (Exercise) 144/80   Blood Pressure (Exit) 108/68   Heart Rate (Admit) 63 bpm   Heart Rate (Exercise) 108 bpm   Heart Rate (Exit) 73 bpm   Oxygen Saturation (Admit) 100 %   Oxygen Saturation (Exercise) 99 %   Oxygen Saturation (Exit) 100 %   Rating of Perceived Exertion (Exercise) 13   Perceived Dyspnea (Exercise) 1   Duration Progress to 45 minutes of aerobic exercise without signs/symptoms of physical distress   Intensity THRR unchanged     Progression   Progression Continue to progress workloads to maintain intensity without signs/symptoms of physical distress.     Resistance Training   Training Prescription Yes   Weight orange bands   Reps 10-15   Time 10 Minutes     Bike   Level 4   Minutes 17     Track   Laps  16   Minutes 17      History  Smoking Status  . Former Smoker  . Packs/day: 1.00  . Years: 58.00  . Types: Cigarettes  . Start date: 10/26/1956  . Quit date: 09/23/2014  Smokeless Tobacco  . Never Used    Comment: She only took a break for 2 months    Goals Met:  Exercise tolerated well No report of cardiac concerns or symptoms Strength training completed today  Goals Unmet:  Not Applicable  Comments: Service time is from 1030 to 1240    Dr. Rush Farmer is Medical Director for Pulmonary Rehab at [facility].

## 2016-12-20 ENCOUNTER — Other Ambulatory Visit: Payer: Self-pay | Admitting: Internal Medicine

## 2016-12-20 MED ORDER — FLUCONAZOLE 150 MG PO TABS: 150.0000 mg | ORAL_TABLET | Freq: Once | ORAL | 0 refills | Status: AC

## 2016-12-24 ENCOUNTER — Encounter (HOSPITAL_COMMUNITY)
Admission: RE | Admit: 2016-12-24 | Discharge: 2016-12-24 | Disposition: A | Discharge status: Home / Self Care | Payer: Medicare Other | Source: Ambulatory Visit | Attending: Pulmonary Disease | Admitting: Pulmonary Disease

## 2016-12-24 VITALS — Wt 148.6 lb

## 2016-12-24 DIAGNOSIS — J849 Interstitial pulmonary disease, unspecified: Secondary | ICD-10-CM | POA: Diagnosis not present

## 2016-12-24 NOTE — Progress Notes (Signed)
Daily Session Note  Patient Details  Name: Catherine Parks MRN: [id number] Date of Birth: Jul 08, 1941 Referring Provider:     Pulmonary Rehab Walk Test from 10/31/2016 in [place]  Referring Provider  Dr. Ashok Cordia      Encounter Date: 12/24/2016  Check In:   Capillary Blood Glucose: No results found for this or any previous visit (from the past 24 hour(s)).      Exercise Prescription Changes - 12/24/16 1200      Response to Exercise   Blood Pressure (Admit) 112/64   Blood Pressure (Exercise) 140/64   Blood Pressure (Exit) 108/60   Heart Rate (Admit) 66 bpm   Heart Rate (Exercise) 93 bpm   Heart Rate (Exit) 70 bpm   Oxygen Saturation (Admit) 100 %   Oxygen Saturation (Exercise) 98 %   Oxygen Saturation (Exit) 99 %   Rating of Perceived Exertion (Exercise) 13   Perceived Dyspnea (Exercise) 1   Duration Progress to 45 minutes of aerobic exercise without signs/symptoms of physical distress   Intensity THRR unchanged     Progression   Progression Continue to progress workloads to maintain intensity without signs/symptoms of physical distress.     Resistance Training   Training Prescription Yes   Weight orange bands   Reps 10-15   Time 10 Minutes     Bike   Level 4   Minutes 17     NuStep   Level 5   Minutes 17   METs 2.8     Track   Laps 18   Minutes 17      History  Smoking Status  . Former Smoker  . Packs/day: 1.00  . Years: 58.00  . Types: Cigarettes  . Start date: 10/26/1956  . Quit date: 09/23/2014  Smokeless Tobacco  . Never Used    Comment: She only took a break for 2 months    Goals Met:  Exercise tolerated well No report of cardiac concerns or symptoms Strength training completed today  Goals Unmet:  Not Applicable  Comments: Service time is from 10:30a to 12:00p    Dr. Rush Farmer is Medical Director for Pulmonary Rehab at [facility].

## 2016-12-24 NOTE — Progress Notes (Signed)
Pulmonary Individual Treatment Plan  Patient Details  Name: Catherine Parks MRN: [id number] Date of Birth: [birth year] Referring Provider:     Pulmonary Rehab Walk Test from 10/31/2016 in [facility]  Referring Provider  Dr. Ashok Cordia      Initial Encounter Date:    Pulmonary Rehab Walk Test from 10/31/2016 in [facility]  Date  10/31/16  Referring Provider  Dr. Ashok Cordia      Visit Diagnosis: ILD (interstitial lung disease) ([place])  Patient's Home Medications on Admission:   Current Outpatient Prescriptions:  .  acetaminophen (TYLENOL) 500 MG tablet, Take 500 mg by mouth every 6 (six) hours as needed (pain)., Disp: , Rfl:  .  amLODipine (NORVASC) 10 MG tablet, TAKE 1 TABLET BY MOUTH EVERY DAY FOR BLOOD PRESSURE, Disp: 90 tablet, Rfl: 3 .  aspirin EC 81 MG tablet, Take 81 mg by mouth 2 (two) times a week.  , Disp: , Rfl:  .  Calcium Citrate (CAL-CITRATE PO), Take by mouth., Disp: , Rfl:  .  cholecalciferol (VITAMIN D) 1000 units tablet, Take 4,000 Units by mouth daily after lunch., Disp: , Rfl:  .  cloNIDine (CATAPRES) 0.2 MG tablet, TAKE 1 TABLET BY MOUTH TWICE A DAY FOR BLOOD PRESSURE, Disp: 180 tablet, Rfl: 1 .  Flaxseed, Linseed, (FLAX SEED OIL) 1000 MG CAPS, Take 1,000 mg by mouth daily after lunch. , Disp: , Rfl:  .  halobetasol (ULTRAVATE) 0.05 % cream, Apply topically 2 (two) times daily., Disp: 50 g, Rfl: 0 .  Magnesium 500 MG TABS, Take 500 mg by mouth., Disp: , Rfl:  .  Naphazoline HCl (CLEAR EYES OP), Place 1 drop into both eyes daily., Disp: , Rfl:  .  Psyllium (METAMUCIL PO), Take 5 mLs by mouth daily after lunch. Mix in 8 oz liquid and drink, Disp: , Rfl:  .  ranitidine (ZANTAC) 300 MG tablet, Take 1 tablet (300 mg total) by mouth at bedtime. PRN, Disp: 90 tablet, Rfl: 1 .  traMADol (ULTRAM) 50 MG tablet, Take 1 tablet (50 mg total) by mouth every 12 (twelve) hours as needed for moderate pain. 1 pill twice daily as  needed for pain, Disp: 60 tablet, Rfl: 0 .  triamterene-hydrochlorothiazide (MAXZIDE-25) 37.5-25 MG tablet, TAKE 1 TABLET BY MOUTH DAILY., Disp: 90 tablet, Rfl: 1  Past Medical History: Past Medical History:  Diagnosis Date  . Allergic rhinitis   . Arthritis   . Bronchiectasis ([place])   . Colon polyps   . Diverticulosis   . Emphysema of lung ([place])   . GERD (gastroesophageal reflux disease)   . HSV-1 (herpes simplex virus 1) infection   . Hypertension     Tobacco Use: History  Smoking Status  . Former Smoker  . Packs/day: 1.00  . Years: 58.00  . Types: Cigarettes  . Start date: 10/26/1956  . Quit date: 09/23/2014  Smokeless Tobacco  . Never Used    Comment: She only took a break for 2 months    Labs: Recent Review Flowsheet Data    Labs for ITP Cardiac and Pulmonary Rehab Latest Ref Rng & Units 04/10/2015 08/04/2015 02/28/2016 06/07/2016 09/17/2016   Cholestrol <200 mg/dL 204(H) 197 221(H) 207(H) 211(H)   LDLCALC <100 mg/dL 102 108 132(H) 129 116(H)   HDL >50 mg/dL 85 73 73 65 76   Trlycerides <150 mg/dL 83 80 79 66 94   Hemoglobin A1c <5.7 % 6.0(H) 5.9(H) 6.1(H) 5.7(H) 5.6  Capillary Blood Glucose: Lab Results  Component Value Date   GLUCAP 83 11/19/2015   GLUCAP 107 (H) 11/01/2014     ADL UCSD:     Pulmonary Assessment Scores    Row Name 10/29/16 1154         ADL UCSD   ADL Phase Entry     SOB Score total 50        Pulmonary Function Assessment:   Exercise Target Goals:    Exercise Program Goal: Individual exercise prescription set with THRR, safety & activity barriers. Participant demonstrates ability to understand and report RPE using BORG scale, to self-measure pulse accurately, and to acknowledge the importance of the exercise prescription.  Exercise Prescription Goal: Starting with aerobic activity 30 plus minutes a day, 3 days per week for initial exercise prescription. Provide home exercise prescription and guidelines that participant  acknowledges understanding prior to discharge.  Activity Barriers & Risk Stratification:     Activity Barriers & Cardiac Risk Stratification - 10/28/16 1015      Activity Barriers & Cardiac Risk Stratification   Activity Barriers Arthritis;Shortness of Breath      6 Minute Walk:     6 Minute Walk    Row Name 10/31/16 1513         6 Minute Walk   Phase Initial     Distance 1175 feet     Walk Time 6 minutes     # of Rest Breaks 0     MPH 2.22     METS 2.68     RPE 12     Perceived Dyspnea  3     Symptoms No     Resting HR 80 bpm     Resting BP 124/84     Max Ex. HR 105 bpm     Max Ex. BP 140/80       Interval HR   Baseline HR 80     1 Minute HR 95     2 Minute HR 85     3 Minute HR 92     4 Minute HR 94     5 Minute HR 105     6 Minute HR 104     2 Minute Post HR 95     Interval Heart Rate? Yes       Interval Oxygen   Interval Oxygen? Yes     Baseline Oxygen Saturation % 100 %     Baseline Liters of Oxygen 0 L     1 Minute Oxygen Saturation % 94 %     1 Minute Liters of Oxygen 0 L     2 Minute Oxygen Saturation % 85 %  questionable reading     2 Minute Liters of Oxygen 0 L     3 Minute Oxygen Saturation % 100 %     3 Minute Liters of Oxygen 0 L     4 Minute Oxygen Saturation % 95 %     4 Minute Liters of Oxygen 0 L     5 Minute Oxygen Saturation % 94 %     5 Minute Liters of Oxygen 0 L     6 Minute Oxygen Saturation % 94 %     6 Minute Liters of Oxygen 0 L     2 Minute Post Oxygen Saturation % 96 %     2 Minute Post Liters of Oxygen 0 L        Oxygen Initial Assessment:     Oxygen  Initial Assessment - 11/25/16 1054      Home Oxygen   Home Oxygen Device None      Oxygen Re-Evaluation:     Oxygen Re-Evaluation    Row Name 12/24/16 0709             Program Oxygen Prescription   Program Oxygen Prescription None         Home Oxygen   Home Oxygen Device None          Oxygen Discharge (Final Oxygen Re-Evaluation):     Oxygen  Re-Evaluation - 12/24/16 0709      Program Oxygen Prescription   Program Oxygen Prescription None     Home Oxygen   Home Oxygen Device None      Initial Exercise Prescription:     Initial Exercise Prescription - 10/31/16 1500      Date of Initial Exercise RX and Referring Provider   Date 10/31/16   Referring Provider Dr. Vevelyn Royals   Level 0.5   Minutes 17     NuStep   Level 2   Minutes 17   METs 1.5     Track   Laps 8   Minutes 17     Prescription Details   Frequency (times per week) 2   Duration Progress to 45 minutes of aerobic exercise without signs/symptoms of physical distress     Intensity   THRR 40-80% of Max Heartrate 58-115   Ratings of Perceived Exertion 11-13   Perceived Dyspnea 0-4     Progression   Progression Continue progressive overload as per policy without signs/symptoms or physical distress.     Resistance Training   Training Prescription Yes   Weight orange bands   Reps 10-12      Perform Capillary Blood Glucose checks as needed.  Exercise Prescription Changes:     Exercise Prescription Changes    Row Name 11/07/16 1200 11/12/16 1200 11/14/16 1200 11/19/16 1200 11/26/16 1200     Response to Exercise   Blood Pressure (Admit) 120/84 134/84 116/62 130/70 128/60   Blood Pressure (Exercise) 156/84 150/74 134/74 124/60 138/80   Blood Pressure (Exit) 108/66 104/72 100/70 104/60 110/74   Heart Rate (Admit) 79 bpm 71 bpm 80 bpm 62 bpm 74 bpm   Heart Rate (Exercise) 110 bpm 97 bpm 98 bpm 129 bpm 116 bpm   Heart Rate (Exit) 78 bpm 79 bpm 75 bpm 69 bpm 83 bpm   Oxygen Saturation (Admit) 100 % 100 % 100 % 100 % 100 %   Oxygen Saturation (Exercise) 100 % 99 % 98 % 97 % 96 %   Oxygen Saturation (Exit) 99 % 100 % 100 % 99 % 100 %   Rating of Perceived Exertion (Exercise) _0 Perceived Dyspnea (Exercise) 2 1 0 1 1   Duration Progress to 45 minutes of aerobic exercise without signs/symptoms of physical distress Progress to 45  minutes of aerobic exercise without signs/symptoms of physical distress Progress to 45 minutes of aerobic exercise without signs/symptoms of physical distress Progress to 45 minutes of aerobic exercise without signs/symptoms of physical distress Progress to 45 minutes of aerobic exercise without signs/symptoms of physical distress   Intensity Other (comment) THRR unchanged THRR unchanged THRR unchanged THRR unchanged     Progression   Progression -  40-80% of HRR Continue to progress workloads to maintain intensity without signs/symptoms of physical distress. Continue to progress workloads to maintain intensity without signs/symptoms of  physical distress. Continue to progress workloads to maintain intensity without signs/symptoms of physical distress. Continue to progress workloads to maintain intensity without signs/symptoms of physical distress.     Resistance Training   Training Prescription _0    Weight _1    Reps 10-12  10 mniutes of strength training 10-15 10-15 10-15 10-15   Time  - 10 Minutes 10 Minutes 10 Minutes 10 Minutes     Bike   Level 0.5 0.5  - 0.5 0.5   Minutes 17 17  - 17 17     NuStep   Level  -  - _2 Minutes  -  - _3 METs  -  - 1.8 2 2.6     Arm Ergometer   Level  - 1  -  -  -   Minutes  - 17  -  -  -     Track   Laps _4 Minutes _5 Row Name 11/28/16 1300 12/03/16 1200 12/05/16 1200 12/10/16 1200 12/12/16 1300     Response to Exercise   Blood Pressure (Admit) 118/64 106/66 144/68 108/88 116/64   Blood Pressure (Exercise) 130/78 120/70 130/70 110/60 120/70   Blood Pressure (Exit) 98/64 100/62 98/64 116/62 110/60   Heart Rate (Admit) 70 bpm 74 bpm 64 bpm 69 bpm 77 bpm   Heart Rate (Exercise) 100 bpm 100 bpm 92 bpm 106 bpm 90 bpm   Heart Rate (Exit) 82 bpm 70 bpm 71 bpm 68 bpm 68 bpm   Oxygen Saturation (Admit) 100 % 98 % 100 % 100 % 98 %    Oxygen Saturation (Exercise) 99 % 98 % 99 % 95 % 99 %   Oxygen Saturation (Exit) 97 % 99 % 100 % 100 % 100 %   Rating of Perceived Exertion (Exercise) _6 Perceived Dyspnea (Exercise) _7 Duration Progress to 45 minutes of aerobic exercise without signs/symptoms of physical distress Progress to 45 minutes of aerobic exercise without signs/symptoms of physical distress Progress to 45 minutes of aerobic exercise without signs/symptoms of physical distress Progress to 45 minutes of aerobic exercise without signs/symptoms of physical distress Progress to 45 minutes of aerobic exercise without signs/symptoms of physical distress   Intensity _8      Progression   Progression Continue to progress workloads to maintain intensity without signs/symptoms of physical distress. Continue to progress workloads to maintain intensity without signs/symptoms of physical distress. Continue to progress workloads to maintain intensity without signs/symptoms of physical distress. Continue to progress workloads to maintain intensity without signs/symptoms of physical distress. Continue to progress workloads to maintain intensity without signs/symptoms of physical distress.     Resistance Training   Training Prescription _9    Weight _10    Reps 10-15 10-15 10-15 10-15 10-15   Time 10 Minutes 10 Minutes 10 Minutes 10 Minutes 10 Minutes     Bike   Level 0.5 3  - 3  -   Minutes 17 17  - 17  -     NuStep   Level _11 Minutes _12 METs 2.5 2.1 2.7 2.9  2.9     Track   Laps  - _0 Minutes  - _1 Row Name 12/17/16 1200 12/19/16 1200           Response to Exercise   Blood Pressure (Admit) 128/68 126/62      Blood Pressure (Exercise) 120/70 144/80      Blood Pressure (Exit) 104/62 108/68      Heart Rate  (Admit) 61 bpm 63 bpm      Heart Rate (Exercise) 100 bpm 108 bpm      Heart Rate (Exit) 75 bpm 73 bpm      Oxygen Saturation (Admit) 100 % 100 %      Oxygen Saturation (Exercise) 98 % 99 %      Oxygen Saturation (Exit) 97 % 100 %      Rating of Perceived Exertion (Exercise) 13 13      Perceived Dyspnea (Exercise) 1 1      Duration Progress to 45 minutes of aerobic exercise without signs/symptoms of physical distress Progress to 45 minutes of aerobic exercise without signs/symptoms of physical distress      Intensity THRR unchanged THRR unchanged        Progression   Progression Continue to progress workloads to maintain intensity without signs/symptoms of physical distress. Continue to progress workloads to maintain intensity without signs/symptoms of physical distress.        Resistance Training   Training Prescription Yes Yes      Weight orange bands orange bands      Reps 10-15 10-15      Time 10 Minutes 10 Minutes        Bike   Level 3 4      Minutes 17 17        NuStep   Level 5  -      Minutes 17  -      METs 2.6  -        Track   Laps 16 16      Minutes 17 17         Exercise Comments:   Exercise Goals and Review:   Exercise Goals Re-Evaluation :     Exercise Goals Re-Evaluation    Row Name 11/25/16 1618 12/23/16 1622           Exercise Goal Re-Evaluation   Exercise Goals Review Increase Physical Activity;Increase Strenth and Stamina Increase Physical Activity;Increase Strenth and Stamina      Comments Patient has only attended four exercise sessions. Will cont. to monitor and progress. Patient is walking up to 16 laps (200 feet each) in 15 minutes. When I asked if she could pick up her pace she said she was not able to because she is limited by her fear of falling. Patient was asked if she needed an assistive device--she declined. Will cont. to monitor and progress as able.      Expected Outcomes Through the exercise here at rehab the patient with increase  physical capacity, strength, and stamina.  -         Discharge Exercise Prescription (Final Exercise Prescription Changes):     Exercise Prescription Changes - 12/19/16 1200      Response to Exercise   Blood Pressure (Admit) 126/62   Blood Pressure (Exercise) 144/80   Blood Pressure (Exit) 108/68   Heart Rate (Admit) 63 bpm   Heart Rate (Exercise) 108 bpm   Heart Rate (Exit) 73 bpm  Oxygen Saturation (Admit) 100 %   Oxygen Saturation (Exercise) 99 %   Oxygen Saturation (Exit) 100 %   Rating of Perceived Exertion (Exercise) 13   Perceived Dyspnea (Exercise) 1   Duration Progress to 45 minutes of aerobic exercise without signs/symptoms of physical distress   Intensity THRR unchanged     Progression   Progression Continue to progress workloads to maintain intensity without signs/symptoms of physical distress.     Resistance Training   Training Prescription Yes   Weight orange bands   Reps 10-15   Time 10 Minutes     Bike   Level 4   Minutes 17     Track   Laps 16   Minutes 17      Nutrition:  Target Goals: Understanding of nutrition guidelines, daily intake of sodium <1538m, cholesterol <2034m calories 30% from fat and 7% or less from saturated fats, daily to have 5 or more servings of fruits and vegetables.  Biometrics:     Pre Biometrics - 10/28/16 1028      Pre Biometrics   Grip Strength 20 kg       Nutrition Therapy Plan and Nutrition Goals:     Nutrition Therapy & Goals - 12/10/16 1213      Nutrition Therapy   Diet Therapeutic Lifestyle Changes     Personal Nutrition Goals   Nutrition Goal Pt to increase consumption of fish, especially fish high in omega 3 fats such as salmon, tuna, sardines, to 2-3 times per week.     Intervention Plan   Intervention Prescribe, educate and counsel regarding individualized specific dietary modifications aiming towards targeted core components such as weight, hypertension, lipid management, diabetes, heart  failure and other comorbidities.   Expected Outcomes Short Term Goal: Understand basic principles of dietary content, such as calories, fat, sodium, cholesterol and nutrients.;Long Term Goal: Adherence to prescribed nutrition plan.      Nutrition Discharge: Rate Your Plate Scores:     Nutrition Assessments - 12/10/16 1213      Rate Your Plate Scores   Pre Score 56      Nutrition Goals Re-Evaluation:   Nutrition Goals Discharge (Final Nutrition Goals Re-Evaluation):   Psychosocial: Target Goals: Acknowledge presence or absence of significant depression and/or stress, maximize coping skills, provide positive support system. Participant is able to verbalize types and ability to use techniques and skills needed for reducing stress and depression.  Initial Review & Psychosocial Screening:     Initial Psych Review & Screening - 10/28/16 10TeninoYes     Barriers   Psychosocial barriers to participate in program There are no identifiable barriers or psychosocial needs.;The patient should benefit from training in stress management and relaxation.     Screening Interventions   Interventions Encouraged to exercise      Quality of Life Scores:     Quality of Life - 10/29/16 1154      Quality of Life Scores   Health/Function Pre 14.29 %   Socioeconomic Pre 14.07 %   Psych/Spiritual Pre 18.29 %   Family Pre 10.17 %   GLOBAL Pre 14.74 %      PHQ-9: Recent Review Flowsheet Data    [facility] 12/18/2016 10/28/2016 09/17/2016 09/14/2016 11/22/2015   Decreased Interest 0 1 0 0 0   Down, Depressed, Hopeless 0 0 0 0 0   PHQ - 2 Score 0 1 0 0 0  Interpretation of Total Score  Total Score Depression Severity:  1-4 = Minimal depression, 5-9 = Mild depression, 10-14 = Moderate depression, 15-19 = Moderately severe depression, 20-27 = Severe depression   Psychosocial Evaluation and Intervention:     Psychosocial Evaluation  - 10/28/16 1035      Psychosocial Evaluation & Interventions   Interventions Encouraged to exercise with the program and follow exercise prescription      Psychosocial Re-Evaluation:     Psychosocial Re-Evaluation    [place] Name 11/25/16 1059 12/24/16 0711           Psychosocial Re-Evaluation   Current issues with None Identified None Identified      Interventions Encouraged to attend Pulmonary Rehabilitation for the exercise Encouraged to attend Pulmonary Rehabilitation for the exercise      Continue Psychosocial Services  No Follow up required No Follow up required         Psychosocial Discharge (Final Psychosocial Re-Evaluation):     Psychosocial Re-Evaluation - 12/24/16 0711      Psychosocial Re-Evaluation   Current issues with None Identified   Interventions Encouraged to attend Pulmonary Rehabilitation for the exercise   Continue Psychosocial Services  No Follow up required      Education: Education Goals: Education classes will be provided on a weekly basis, covering required topics. Participant will state understanding/return demonstration of topics presented.  Learning Barriers/Preferences:     Learning Barriers/Preferences - 10/28/16 1025      Learning Barriers/Preferences   Learning Barriers None   Learning Preferences Group Instruction;Individual Instruction;Computer/Internet;Verbal Instruction;Written Material      Education Topics: Risk Factor Reduction:  -Group instruction that is supported by a PowerPoint presentation. Instructor discusses the definition of a risk factor, different risk factors for pulmonary disease, and how the heart and lungs work together.     PULMONARY REHAB OTHER RESPIRATORY from 12/19/2016 in [facility]  Date  12/05/16  Educator  EP  Instruction Review Code  2- meets goals/outcomes      Nutrition for Pulmonary Patient:  -Group instruction provided by PowerPoint slides, verbal discussion, and  written materials to support subject matter. The instructor gives an explanation and review of healthy diet recommendations, which includes a discussion on weight management, recommendations for fruit and vegetable consumption, as well as protein, fluid, caffeine, fiber, sodium, sugar, and alcohol. Tips for eating when patients are short of breath are discussed.   PULMONARY REHAB OTHER RESPIRATORY from 12/19/2016 in [facility]  Date  11/28/16  Educator  RD  Instruction Review Code  2- meets goals/outcomes      Pursed Lip Breathing:  -Group instruction that is supported by demonstration and informational handouts. Instructor discusses the benefits of pursed lip and diaphragmatic breathing and detailed demonstration on how to preform both.     Oxygen Safety:  -Group instruction provided by PowerPoint, verbal discussion, and written material to support subject matter. There is an overview of "What is Oxygen" and "Why do we need it".  Instructor also reviews how to create a safe environment for oxygen use, the importance of using oxygen as prescribed, and the risks of noncompliance. There is a brief discussion on traveling with oxygen and resources the patient may utilize.   PULMONARY REHAB OTHER RESPIRATORY from 12/19/2016 in [place]  Date  12/19/16  Educator  Truddie Crumble  Instruction Review Code  2- meets goals/outcomes      Oxygen Equipment:  -Group instruction  provided by [facility] Staff utilizing handouts, written materials, and equipment demonstrations.   Signs and Symptoms:  -Group instruction provided by written material and verbal discussion to support subject matter. Warning signs and symptoms of infection, stroke, and heart attack are reviewed and when to call the physician/911 reinforced. Tips for preventing the spread of infection discussed.   Advanced Directives:  -Group instruction provided by verbal instruction and  written material to support subject matter. Instructor reviews Advanced Directive laws and proper instruction for filling out document.   Pulmonary Video:  -Group video education that reviews the importance of medication and oxygen compliance, exercise, good nutrition, pulmonary hygiene, and pursed lip and diaphragmatic breathing for the pulmonary patient.   PULMONARY REHAB OTHER RESPIRATORY from 12/19/2016 in [place]  Date  11/07/16  Instruction Review Code  2- meets goals/outcomes      Exercise for the Pulmonary Patient:  -Group instruction that is supported by a PowerPoint presentation. Instructor discusses benefits of exercise, core components of exercise, frequency, duration, and intensity of an exercise routine, importance of utilizing pulse oximetry during exercise, safety while exercising, and options of places to exercise outside of rehab.     Pulmonary Medications:  -Verbally interactive group education provided by instructor with focus on inhaled medications and proper administration.   PULMONARY REHAB OTHER RESPIRATORY from 12/19/2016 in [place]  Date  12/12/16  Educator  pharm  Instruction Review Code  2- meets goals/outcomes      Anatomy and Physiology of the Respiratory System and Intimacy:  -Group instruction provided by PowerPoint, verbal discussion, and written material to support subject matter. Instructor reviews respiratory cycle and anatomical components of the respiratory system and their functions. Instructor also reviews differences in obstructive and restrictive respiratory diseases with examples of each. Intimacy, Sex, and Sexuality differences are reviewed with a discussion on how relationships can change when diagnosed with pulmonary disease. Common sexual concerns are reviewed.   Knowledge Questionnaire Score:     Knowledge Questionnaire Score - 10/29/16 1154      Knowledge Questionnaire  Score   Pre Score 12/13      Core Components/Risk Factors/Patient Goals at Admission:     Personal Goals and Risk Factors at Admission - 10/28/16 1030      Core Components/Risk Factors/Patient Goals on Admission   Increase Strength and Stamina Yes   Improve shortness of breath with ADL's Yes   Intervention Provide education, individualized exercise plan and daily activity instruction to help decrease symptoms of SOB with activities of daily living.   Expected Outcomes Short Term: Achieves a reduction of symptoms when performing activities of daily living.   Develop more efficient breathing techniques such as purse lipped breathing and diaphragmatic breathing; and practicing self-pacing with activity Yes   Intervention Provide education, demonstration and support about specific breathing techniuqes utilized for more efficient breathing. Include techniques such as pursed lipped breathing, diaphragmatic breathing and self-pacing activity.   Expected Outcomes Short Term: Participant will be able to demonstrate and use breathing techniques as needed throughout daily activities.   Hypertension Yes   Intervention Provide education on lifestyle modifcations including regular physical activity/exercise, weight management, moderate sodium restriction and increased consumption of fresh fruit, vegetables, and low fat dairy, alcohol moderation, and smoking cessation.;Monitor prescription use compliance.   Expected Outcomes Short Term: Continued assessment and intervention until BP is < 140/36m HG in hypertensive participants. < 130/865mHG in hypertensive participants with diabetes, heart failure  or chronic kidney disease.;Long Term: Maintenance of blood pressure at goal levels.      Core Components/Risk Factors/Patient Goals Review:      Goals and Risk Factor Review    Row Name 11/25/16 1056 12/24/16 0711           Core Components/Risk Factors/Patient Goals Review   Personal Goals Review  Improve shortness of breath with ADL's;Develop more efficient breathing techniques such as purse lipped breathing and diaphragmatic breathing and practicing self-pacing with activity.;Hypertension Improve shortness of breath with ADL's;Develop more efficient breathing techniques such as purse lipped breathing and diaphragmatic breathing and practicing self-pacing with activity.;Hypertension      Review see comments section on ITP see comments section on ITP      Expected Outcomes see admission expected outcomes on ITP see admission expected outcomes on ITP         Core Components/Risk Factors/Patient Goals at Discharge (Final Review):      Goals and Risk Factor Review - 12/24/16 0711      Core Components/Risk Factors/Patient Goals Review   Personal Goals Review Improve shortness of breath with ADL's;Develop more efficient breathing techniques such as purse lipped breathing and diaphragmatic breathing and practicing self-pacing with activity.;Hypertension   Review see comments section on ITP   Expected Outcomes see admission expected outcomes on ITP      ITP Comments:   Comments: ITP REVIEW Pt is making expected progress toward pulmonary rehab goals after completing 12  sessions. Recommend continued exercise, life style modification, education, and utilization of breathing techniques to increase stamina and strength and decrease shortness of breath with exertion.

## 2016-12-24 NOTE — Progress Notes (Deleted)
Daily Session Note  Patient Details  Name: Catherine Parks MRN: [id number] Date of Birth: 1941/06/20 Referring Provider:     Pulmonary Rehab Walk Test from 10/31/2016 in [place]  Referring Provider  Dr. Ashok Cordia      Encounter Date: 12/24/2016  Check In:     Session Check In - 12/24/16 1215      Check-In   Location [facility]   Staff Present Su Hilt, MS, ACSM RCEP, Exercise Physiologist;Lisa Ysidro Evert, RN;Portia [place], RN, Maxcine Ham, RN, BSN   Supervising physician immediately available to respond to emergencies [facility] immediately available   Physician(s) Dr. Ree Kida   Medication changes reported     No   Fall or balance concerns reported    No   Tobacco Cessation No Change   Warm-up and Cool-down Performed as group-led instruction   Resistance Training Performed Yes   VAD Patient? No     Pain Assessment   Currently in Pain? No/denies   Multiple Pain Sites No      Capillary Blood Glucose: No results found for this or any previous visit (from the past 24 hour(s)).      Exercise Prescription Changes - 12/24/16 1200      Response to Exercise   Blood Pressure (Admit) 112/64   Blood Pressure (Exercise) 140/64   Blood Pressure (Exit) 108/60   Heart Rate (Admit) 66 bpm   Heart Rate (Exercise) 93 bpm   Heart Rate (Exit) 70 bpm   Oxygen Saturation (Admit) 100 %   Oxygen Saturation (Exercise) 98 %   Oxygen Saturation (Exit) 99 %   Rating of Perceived Exertion (Exercise) 13   Perceived Dyspnea (Exercise) 1   Duration Progress to 45 minutes of aerobic exercise without signs/symptoms of physical distress   Intensity THRR unchanged     Progression   Progression Continue to progress workloads to maintain intensity without signs/symptoms of physical distress.     Resistance Training   Training Prescription Yes   Weight orange bands   Reps 10-15   Time 10 Minutes     Bike   Level 4   Minutes  17     NuStep   Level 5   Minutes 17   METs 2.8     Track   Laps 18   Minutes 17      History  Smoking Status  . Former Smoker  . Packs/day: 1.00  . Years: 58.00  . Types: Cigarettes  . Start date: 10/26/1956  . Quit date: 09/23/2014  Smokeless Tobacco  . Never Used    Comment: She only took a break for 2 months    Goals Met:  Exercise tolerated well No report of cardiac concerns or symptoms Strength training completed today  Goals Unmet:  Not Applicable  Comments: Service time is from 10:30a to 12:00p    Dr. Rush Farmer is Medical Director for Pulmonary Rehab at [facility].

## 2016-12-26 ENCOUNTER — Encounter (HOSPITAL_COMMUNITY)
Admission: RE | Admit: 2016-12-26 | Discharge: 2016-12-26 | Disposition: A | Discharge status: Home / Self Care | Payer: Medicare Other | Source: Ambulatory Visit | Attending: Pulmonary Disease | Admitting: Pulmonary Disease

## 2016-12-26 VITALS — Wt 147.9 lb

## 2016-12-26 DIAGNOSIS — J849 Interstitial pulmonary disease, unspecified: Secondary | ICD-10-CM

## 2016-12-26 NOTE — Progress Notes (Signed)
Daily Session Note  Patient Details  Name: Catherine Parks MRN: [id number] Date of Birth: Dec 12, 1940 Referring Provider:     Pulmonary Rehab Walk Test from 10/31/2016 in [place]  Referring Provider  Dr. Ashok Cordia      Encounter Date: 12/26/2016  Check In:     Session Check In - 12/26/16 1030      Check-In   [facility]   Staff Present Rosebud Poles, RN, BSN;Garrie Elenes, MS, ACSM RCEP, Exercise Physiologist;Lisa Ysidro Evert, RN;Portia Rollene Rotunda, RN, BSN   Supervising physician immediately available to respond to emergencies [facility] immediately available   Physician(s) Dr. Cathlean Sauer   Medication changes reported     No   Fall or balance concerns reported    No   Tobacco Cessation No Change   Warm-up and Cool-down Performed as group-led instruction   Resistance Training Performed Yes   VAD Patient? No     Pain Assessment   Currently in Pain? No/denies   Multiple Pain Sites No      Capillary Blood Glucose: No results found for this or any previous visit (from the past 24 hour(s)).      Exercise Prescription Changes - 12/26/16 1200      Response to Exercise   Blood Pressure (Admit) 110/68   Blood Pressure (Exercise) 132/70   Blood Pressure (Exit) 102/70   Heart Rate (Admit) 60 bpm   Heart Rate (Exercise) 89 bpm   Heart Rate (Exit) 66 bpm   Oxygen Saturation (Admit) 99 %   Oxygen Saturation (Exercise) 98 %   Oxygen Saturation (Exit) 97 %   Rating of Perceived Exertion (Exercise) 11   Perceived Dyspnea (Exercise) 1   Duration Progress to 45 minutes of aerobic exercise without signs/symptoms of physical distress   Intensity THRR unchanged     Progression   Progression Continue to progress workloads to maintain intensity without signs/symptoms of physical distress.     Resistance Training   Training Prescription Yes   Weight orange bands   Reps 10-15   Time 10 Minutes     NuStep   Level 5   Minutes  17   METs 2.4     Track   Laps 17   Minutes 17      History  Smoking Status  . Former Smoker  . Packs/day: 1.00  . Years: 58.00  . Types: Cigarettes  . Start date: 10/26/1956  . Quit date: 09/23/2014  Smokeless Tobacco  . Never Used    Comment: She only took a break for 2 months    Goals Met:  Exercise tolerated well No report of cardiac concerns or symptoms Strength training completed today  Goals Unmet:  Not Applicable  Comments: Service time is from 10:30a to 12:05p    Dr. Rush Farmer is Medical Director for Pulmonary Rehab at [facility].

## 2016-12-27 ENCOUNTER — Ambulatory Visit (INDEPENDENT_AMBULATORY_CARE_PROVIDER_SITE_OTHER): Payer: Medicare Other | Admitting: Pulmonary Disease

## 2016-12-27 ENCOUNTER — Ambulatory Visit: Payer: Medicare Other

## 2016-12-27 ENCOUNTER — Encounter: Payer: Self-pay | Admitting: Pulmonary Disease

## 2016-12-27 ENCOUNTER — Ambulatory Visit: Payer: Medicare Other | Admitting: Pulmonary Disease

## 2016-12-27 ENCOUNTER — Ambulatory Visit (INDEPENDENT_AMBULATORY_CARE_PROVIDER_SITE_OTHER): Payer: Medicare Other | Admitting: *Deleted

## 2016-12-27 VITALS — BP 120/80 | HR 56 | Ht 66.0 in | Wt 149.0 lb

## 2016-12-27 DIAGNOSIS — K219 Gastro-esophageal reflux disease without esophagitis: Secondary | ICD-10-CM | POA: Diagnosis not present

## 2016-12-27 DIAGNOSIS — J449 Chronic obstructive pulmonary disease, unspecified: Secondary | ICD-10-CM

## 2016-12-27 DIAGNOSIS — J849 Interstitial pulmonary disease, unspecified: Secondary | ICD-10-CM

## 2016-12-27 DIAGNOSIS — R0600 Dyspnea, unspecified: Secondary | ICD-10-CM

## 2016-12-27 LAB — PULMONARY FUNCTION TEST
DL/VA % pred: 48 %
DL/VA: 2.44 ml/min/mmHg/L
DLCO UNC % PRED: 40 %
DLCO UNC: 10.95 ml/min/mmHg
FEF 25-75 PRE: 0.93 L/s
FEF2575-%Pred-Pre: 58 %
FEV1-%Pred-Pre: 99 %
FEV1-Pre: 1.85 L
FEV1FVC-%PRED-PRE: 86 %
FEV6-%PRED-PRE: 119 %
FEV6-Pre: 2.76 L
FEV6FVC-%PRED-PRE: 102 %
FVC-%Pred-Pre: 117 %
FVC-Pre: 2.81 L
PRE FEV1/FVC RATIO: 66 %
PRE FEV6/FVC RATIO: 98 %

## 2016-12-27 NOTE — Progress Notes (Signed)
PFT done today. 

## 2016-12-27 NOTE — Progress Notes (Signed)
Subjective:    Patient ID: Catherine Parks, female    DOB: 05/08/1941, 76 y.o.   MRN: [id number]  C.C.:  Follow-up for Mild COPD w/ Emphysema, ILD, Left Lung Nodule, Chronic Seasonal Allergic Rhinitis, & GERD.   HPI Mild COPD with emphysema: Not currently on inhaler medications. Patient denies any coughing or wheezing. No new dyspnea.   ILD: Serum workup suggestive of lupus. Referred to rheumatology and not currently on immunosuppression. Pattern is atypical UIP versus fibrotic NSIP. Questionable honeycomb changes on recent CT scan. She is participating in pulmonary rehab.   Left lung nodule: 6 mm left lower lobe nodule noted on high-resolution CT scan in November 2017. Stable on repeat CT imaging in February.  Chronic seasonal allergic rhinitis: Not currently on treatment. She reports some mild sinus congestion & post-nasal drainage.   GERD: Currently on Zantac. No reflux or dyspepsia. Rarely still has some brash water taste but her Zantac was recently increased last week.   Review of Systems No new rashes or bruising. No chest pain or pressure. No fever, chills, or new sweats.   Allergies  Allergen Reactions  . Lemon Oil Hives    Reaction to lemons  . Shellfish Allergy Swelling    Current Outpatient Prescriptions on File Prior to Visit  Medication Sig Dispense Refill  . acetaminophen (TYLENOL) 500 MG tablet Take 500 mg by mouth every 6 (six) hours as needed (pain).    Marland Kitchen amLODipine (NORVASC) 10 MG tablet TAKE 1 TABLET BY MOUTH EVERY DAY FOR BLOOD PRESSURE 90 tablet 3  . aspirin EC 81 MG tablet Take 81 mg by mouth 2 (two) times a week.      . Calcium Citrate (CAL-CITRATE PO) Take by mouth.    . cholecalciferol (VITAMIN D) 1000 units tablet Take 4,000 Units by mouth daily after lunch.    . cloNIDine (CATAPRES) 0.2 MG tablet TAKE 1 TABLET BY MOUTH TWICE A DAY FOR BLOOD PRESSURE 180 tablet 1  . Flaxseed, Linseed, (FLAX SEED OIL) 1000 MG CAPS Take 1,000 mg by mouth daily after lunch.      . halobetasol (ULTRAVATE) 0.05 % cream Apply topically 2 (two) times daily. 50 g 0  . Magnesium 500 MG TABS Take 500 mg by mouth.    . Naphazoline HCl (CLEAR EYES OP) Place 1 drop into both eyes daily.    . Psyllium (METAMUCIL PO) Take 5 mLs by mouth daily after lunch. Mix in 8 oz liquid and drink    . ranitidine (ZANTAC) 300 MG tablet Take 1 tablet (300 mg total) by mouth at bedtime. PRN 90 tablet 1  . traMADol (ULTRAM) 50 MG tablet Take 1 tablet (50 mg total) by mouth every 12 (twelve) hours as needed for moderate pain. 1 pill twice daily as needed for pain 60 tablet 0  . triamterene-hydrochlorothiazide (MAXZIDE-25) 37.5-25 MG tablet TAKE 1 TABLET BY MOUTH DAILY. 90 tablet 1   No current facility-administered medications on file prior to visit.     Past Medical History:  Diagnosis Date  . Allergic rhinitis   . Arthritis   . Bronchiectasis ([place])   . Colon polyps   . Diverticulosis   . Emphysema of lung ([place])   . GERD (gastroesophageal reflux disease)   . HSV-1 (herpes simplex virus 1) infection   . Hypertension     Past Surgical History:  Procedure Laterality Date  . ABDOMINAL HYSTERECTOMY    . COLONOSCOPY    . DIRECT LARYNGOSCOPY N/A 10/21/2014   Procedure:  DIRECT LARYNGOSCOPY;  Surgeon: Rozetta Nunnery, MD;  Location: [facility];  Service: ENT;  Laterality: N/A;  . EXCISION NASAL MASS Right 10/21/2014   Procedure: EXCISION NASOPHARYNGEAL MASS;  Surgeon: Rozetta Nunnery, MD;  Location: [facility];  Service: ENT;  Laterality: Right;  . EYE SURGERY     cataracts  . MASS EXCISION Right 10/21/2014   Procedure: RIGHT NECK NODE BIOPSY;  Surgeon: Rozetta Nunnery, MD;  Location: [facility];  Service: ENT;  Laterality: Right;    Family History  Problem Relation Age of Onset  . Heart attack Father   . Hypertension Mother   . Stroke Mother   . Breast cancer Sister   . Cancer Brother   . Prostate cancer Brother   . Breast  cancer Maternal Grandmother   . Lung disease Neg Hx   . Rheumatologic disease Neg Hx     Social History   Social History  . Marital status: Widowed    Spouse name: N/A  . Number of children: N/A  . Years of education: N/A   Social History Main Topics  . Smoking status: Former Smoker    Packs/day: 1.00    Years: 58.00    Types: Cigarettes    Start date: 10/26/1956    Quit date: 09/23/2014  . Smokeless tobacco: Never Used     Comment: She only took a break for 2 months  . Alcohol use No  . Drug use: No  . Sexual activity: Not Asked     Comment: has not smoked since 09/27/14-using nicotin gum   Other Topics Concern  . None   Social History Narrative   Originally from [place]. She previously worked as a Pharmacist, hospital. Currently has a dog. Denies any bird exposure. No mold or hot tub exposure.       Objective:   Physical Exam BP 120/80 (BP Location: Right Arm, Patient Position: Sitting, Cuff Size: Normal)   Pulse (!) 56   Ht '5\' 6"'  (1.676 m)   Wt 149 lb (67.6 kg)   SpO2 100%   BMI 24.05 kg/m   Gen.: Elderly female. No distress. Appears comfortable. Integument: No rash or bruising on exposed skin. Warm and dry. Musculoskeletal: No joint deformity. No effusion appreciated. No synovial thickening appreciated. HEENT: Minimal nasal turbinate swelling. No oral ulcers. Moist mucous membranes. Pulmonary: Generally clear with auscultation. No accessory muscle use. Speaking in complete sentences. Cardiac: Regular rate. Regular rhythm. No edema.  PFT 12/27/16: FVC 2.81 L (117%) FEV1 1.85 L (99%) FEV1/FVC 0.66 FEF 25-75 0.93 L (58%)                                                                                                                        DLCO uncorrected 40% 09/27/16: FVC 2.92 L (121%) FEV1 2.01 L (180%) FEV1/FVC 0.69 FEF 25-75 1.20 L (74%) negative bronchodilator response TLC 5.05 L (94%) RV 85% ERV 501% DLCO corrected 50% (Hgb 12.7)  6MWT  12/27/16:  Walked 408 meters / Baseline  Sat 100% on RA / Nadir Sat 98% @ end of walk 09/27/16:  Walked 377 meters / Baseline Sat 100% on RA / Nadir Sat 90% @ end of walk  IMAGING CT CHEST W/O 10/22/16 (personally reviewed by me): Tiny bilateral lung nodules. 4 mm left upper lobe nodule & 6 mm left lower lobe nodule stable. Reticulation with some suggestion of honeycombing noted. No pleural effusion or thickening. No pericardial effusion. No pathologic mediastinal adenopathy.  ESOPHAGRAM/BARIUM SWALLOW 08/13/16 (per radiologist): Laryngeal penetration during swallowing due to delayed initiation with no tracheal aspiration. Slight feline esophagus appearance can sometimes correlate with mild esophagitis. No ulceration or erosions identified. No dysmotility. No reflux.  HRCT CHEST W/O 08/07/16 (previously reviewed by me):  Mild to moderate apical predominant centrilobular emphysema. 6 mm left lower lobe nodule adjacent the diaphragm is noted. Slight craniocaudal progression of subpleural reticulation with some traction bronchiectasis within right lower lobe as well as possible early honeycombing within same lobe. No obvious groundglass opacities. No pleural effusion or thickening. No pericardial effusion. No pathologic mediastinal adenopathy.   CT CHEST W/O 06/17/16 (previously reviewed by me): Patchy opacification with some subpleural reticulation suggested in right upper lung zone. Was focal and lower lung suggestion of bronchiectasis. Upper lobe predominant emphysematous changes. No pleural effusion or thickening. No pericardial effusion. No pathologic mediastinal adenopathy.  CARDIAC  TTE (11/01/14): LV normal in size with EF 60-65%. Hypokinesis of basal inferior myocardium with global lateral strain -21.7%. LA & RA normal in size. RV normal in size and function. Artery systolic pressure 40 mmHg. No aortic stenosis or regurgitation. Aortic root normal in size. Mild mitral regurgitation without stenosis. No colonic stenosis with trivial  regurgitation. Moderate tricuspid regurgitation. No pericardial effusion.  LABS 08/02/16 Alpha-1 antitrypsin: MM (153) IgG: 1276 IgA: 658 IgM: 76 IgE: 1652 RAST Panel:  D farinae 1.75 / Cockroach 1.66 & other weak positives CRP: 0.1 ESR: 59 ANA:  Positive DS DNA Ab:  181 Smith Ab:  <0.2 RNP Ab:  <0.2 SSA:  <0.2 SSB:  <0.2 Anti-CCP:  <16    Assessment & Plan:  76 y.o. female with mild COPD with emphysema, interstitial lung disease, left lung nodule, chronic seasonal allergic rhinitis, & GERD. Her spirometry today has significantly worsened but her walk test distance has improved significantly and her oxygenation remained stable on my review. Given her absence of symptoms I do not believe that her interstitial lung disease isn't active at this time and I do not believe that inhaler medication or immunosuppression would be of great symptomatic benefit to the patient. We reviewed her chest CT scan which shows stability in her lung nodules. We will continue to monitor these nodules. We did review the fact that reflux control is necessary and she will notify me if her reflux does not improve on her higher dose of Zantac.   1. Mild COPD with emphysema: Currently asymptomatic. Continuing to monitor symptoms and spirometry. 2. ILD: Likely secondary to lupus. Previously evaluated by rheumatology. No indication for immunosuppression at this time. Continue to monitor with spirometry with DLCO and 6 minute walk test at next appointment. 3. Left lung nodule:  Stable. Plan to repeat CT imaging in May 2018. Further imaging depending upon this result. 4. Chronic seasonal allergic rhinitis: No new medications. Patient declines further treatment. 5. GERD: Continuing on increased dose of Zantac. Patient to notify one of her physicians if reflux remains uncontrolled. 6. Health maintenance: Status post Influenza Vaccine  September 2017, [place] March 2017, Pneumovax 08 May 2013, & Tdap November  2012. 7. Follow-up: Return to clinic in 6 months or sooner if needed.  Sonia Baller Ashok Cordia, M.D. [facility] Pulmonary & Critical Care Pager:  [phone] After 3pm or if no response, call [phone] 10:57 AM 12/27/16

## 2016-12-27 NOTE — Patient Instructions (Addendum)
   Call me if you have any new breathing problems or questions before your next appointment.  Make sure you let one of [place] know if your reflux doesn't improve with the higher dose of Zantac.   TESTS ORDERED: 1. CT Chest w/o May 2018 2. Spirometry with DLCO at next appointment 3. 6MWT on room air at next appointment

## 2016-12-27 NOTE — Progress Notes (Signed)
SIX MIN WALK 12/27/2016 09/27/2016  Medications Triamterene/HCTZ, clonodine taken at 7 am NORVASC 10MG, CALCIUM CITRATE, VITAMIN D 1000 units,FLEX SEED 1000MG, MAXZIDE-25 all taken at 7:15am   Supplimental Oxygen during Test? (L/min) No No  Laps 8 7  Partial Lap (in Meters) 24 41  Baseline BP (sitting) 118/70 130/72  Baseline Heartrate 66 72  Baseline Dyspnea (Borg Scale) 0 5  Baseline Fatigue (Borg Scale) 0 0  Baseline SPO2 100 100  BP (sitting) 130/80 140/80  Heartrate 79 73  Dyspnea (Borg Scale) 1 8  Fatigue (Borg Scale) 1 2  SPO2 98 90  BP (sitting) 122/76 136/80  Heartrate 64 68  SPO2 100 99  Stopped or Paused before Six Minutes No No  Interpretation - Hip pain  Distance Completed 408 377  Tech Comments: pt completed the test without difficulty pt completed test with no pauses or stops. pt did c/o hip pain but states she doesn't normally exert herself this much.    

## 2016-12-31 ENCOUNTER — Encounter (HOSPITAL_COMMUNITY)
Admission: RE | Admit: 2016-12-31 | Discharge: 2016-12-31 | Disposition: A | Discharge status: Home / Self Care | Payer: Medicare Other | Source: Ambulatory Visit | Attending: Pulmonary Disease | Admitting: Pulmonary Disease

## 2016-12-31 VITALS — Wt 148.1 lb

## 2016-12-31 DIAGNOSIS — J849 Interstitial pulmonary disease, unspecified: Secondary | ICD-10-CM | POA: Diagnosis not present

## 2016-12-31 NOTE — Progress Notes (Signed)
Daily Session Note  Patient Details  Name: Catherine Parks MRN: [id number] Date of Birth: 10-Nov-1940 Referring Provider:     Pulmonary Rehab Walk Test from 10/31/2016 in [place]  Referring Provider  Dr. Ashok Cordia      Encounter Date: 12/31/2016  Check In:     Session Check In - 12/31/16 1227      Check-In   Location [facility]   Staff Present Su Hilt, MS, ACSM RCEP, Exercise Physiologist;Joan Leonia Reeves, RN, BSN;Lisa Ysidro Evert, RN;Portia Rollene Rotunda, RN, BSN   Supervising physician immediately available to respond to emergencies [facility] immediately available   Physician(s) Dr. Cathlean Sauer   Medication changes reported     No   Fall or balance concerns reported    No   Tobacco Cessation No Change   Warm-up and Cool-down Performed as group-led instruction   Resistance Training Performed Yes   VAD Patient? No     Pain Assessment   Currently in Pain? No/denies   Multiple Pain Sites No      Capillary Blood Glucose: No results found for this or any previous visit (from the past 24 hour(s)).      Exercise Prescription Changes - 12/31/16 1200      Response to Exercise   Blood Pressure (Admit) 106/60   Blood Pressure (Exercise) 136/74   Blood Pressure (Exit) 104/64   Heart Rate (Admit) 67 bpm   Heart Rate (Exercise) 128 bpm   Heart Rate (Exit) 76 bpm   Oxygen Saturation (Admit) 100 %   Oxygen Saturation (Exercise) 98 %   Oxygen Saturation (Exit) 100 %   Rating of Perceived Exertion (Exercise) 13   Perceived Dyspnea (Exercise) 1   Duration Progress to 45 minutes of aerobic exercise without signs/symptoms of physical distress   Intensity THRR unchanged     Progression   Progression Continue to progress workloads to maintain intensity without signs/symptoms of physical distress.     Resistance Training   Training Prescription Yes   Weight orange bands   Reps 10-15   Time 10 Minutes     Bike   Level 4   Minutes  17     NuStep   Level 5   Minutes 17   METs 2.5     Track   Laps 17   Minutes 17      History  Smoking Status  . Former Smoker  . Packs/day: 1.00  . Years: 58.00  . Types: Cigarettes  . Start date: 10/26/1956  . Quit date: 09/23/2014  Smokeless Tobacco  . Never Used    Comment: She only took a break for 2 months    Goals Met:  Exercise tolerated well No report of cardiac concerns or symptoms Strength training completed today  Goals Unmet:  Not Applicable  Comments: Service time is from 10:30a to 12:10p    Dr. Rush Farmer is Medical Director for Pulmonary Rehab at [facility].

## 2017-01-02 ENCOUNTER — Encounter (HOSPITAL_COMMUNITY)
Admission: RE | Admit: 2017-01-02 | Discharge: 2017-01-02 | Disposition: A | Discharge status: Home / Self Care | Payer: Medicare Other | Source: Ambulatory Visit | Attending: Pulmonary Disease | Admitting: Pulmonary Disease

## 2017-01-02 VITALS — Wt 147.7 lb

## 2017-01-02 DIAGNOSIS — J849 Interstitial pulmonary disease, unspecified: Secondary | ICD-10-CM | POA: Diagnosis not present

## 2017-01-02 NOTE — Progress Notes (Signed)
Daily Session Note  Patient Details  Name: Catherine Parks MRN: [id number] Date of Birth: 04/02/1941 Referring Provider:     Pulmonary Rehab Walk Test from 10/31/2016 in [place]  Referring Provider  Dr. Ashok Cordia      Encounter Date: 01/02/2017  Check In:     Session Check In - 01/02/17 1030      Check-In   [facility]   Staff Present Su Hilt, MS, ACSM RCEP, Exercise Physiologist;Joan Leonia Reeves, RN, Luisa Hart, RN, BSN   Supervising physician immediately available to respond to emergencies [facility] immediately available   Physician(s) Dr. Candiss Norse   Medication changes reported     No   Fall or balance concerns reported    No   Tobacco Cessation No Change   Warm-up and Cool-down Performed as group-led instruction   Resistance Training Performed Yes   VAD Patient? No     Pain Assessment   Currently in Pain? No/denies   Multiple Pain Sites No      Capillary Blood Glucose: No results found for this or any previous visit (from the past 24 hour(s)).      Exercise Prescription Changes - 01/02/17 1200      Response to Exercise   Blood Pressure (Admit) 132/72   Blood Pressure (Exercise) 148/82   Blood Pressure (Exit) 120/70   Heart Rate (Admit) 90 bpm   Heart Rate (Exercise) 91 bpm   Heart Rate (Exit) 71 bpm   Oxygen Saturation (Admit) 100 %   Oxygen Saturation (Exercise) 99 %   Oxygen Saturation (Exit) 97 %   Rating of Perceived Exertion (Exercise) 13   Perceived Dyspnea (Exercise) 1   Duration Progress to 45 minutes of aerobic exercise without signs/symptoms of physical distress   Intensity THRR unchanged     Progression   Progression Continue to progress workloads to maintain intensity without signs/symptoms of physical distress.     Resistance Training   Training Prescription Yes   Weight orange bands   Reps 10-15   Time 10 Minutes     Bike   Level 4   Minutes 17     NuStep   Level 5   Minutes 17   METs 2.5      History  Smoking Status  . Former Smoker  . Packs/day: 1.00  . Years: 58.00  . Types: Cigarettes  . Start date: 10/26/1956  . Quit date: 09/23/2014  Smokeless Tobacco  . Never Used    Comment: She only took a break for 2 months    Goals Met:  Exercise tolerated well No report of cardiac concerns or symptoms Strength training completed today  Goals Unmet:  Not Applicable  Comments: Service time is from 10:30a to 12:30p    Dr. Rush Farmer is Medical Director for Pulmonary Rehab at [facility].

## 2017-01-02 NOTE — Progress Notes (Signed)
I have reviewed a Home Exercise Prescription with Catherine Parks . Catherine Parks is not currently exercising at home.  The patient was advised to walk 2-3 days a week for 30 minutes.  Catherine Parks and I discussed how to progress their exercise prescription.  The patient stated that their goals were to increase walking distance, increase strength, and improve balance.  The patient stated that they understand the exercise prescription.  We reviewed exercise guidelines, target heart rate during exercise, oxygen use, weather, home pulse oximeter, endpoints for exercise, and goals.  Patient is encouraged to come to me with any questions. I will continue to follow up with the patient to assist them with progression and safety.

## 2017-01-07 ENCOUNTER — Encounter (HOSPITAL_COMMUNITY)
Admission: RE | Admit: 2017-01-07 | Discharge: 2017-01-07 | Disposition: A | Discharge status: Home / Self Care | Payer: Medicare Other | Source: Ambulatory Visit | Attending: Pulmonary Disease | Admitting: Pulmonary Disease

## 2017-01-07 VITALS — Wt 147.7 lb

## 2017-01-07 DIAGNOSIS — J849 Interstitial pulmonary disease, unspecified: Secondary | ICD-10-CM | POA: Diagnosis not present

## 2017-01-07 NOTE — Progress Notes (Signed)
Daily Session Note  Patient Details  Name: Catherine Parks MRN: [id number] Date of Birth: 10-24-1940 Referring Provider:     Pulmonary Rehab Walk Test from 10/31/2016 in [place]  Referring Provider  Dr. Ashok Cordia      Encounter Date: 01/07/2017  Check In:     Session Check In - 01/07/17 1025      Check-In   Location [facility]   Staff Present Su Hilt, MS, ACSM RCEP, Exercise Physiologist;Joan Leonia Reeves, RN, Luisa Hart, RN, Roque Cash, RN   Supervising physician immediately available to respond to emergencies [facility] immediately available   Physician(s) Dr. Candiss Norse   Medication changes reported     No   Fall or balance concerns reported    No   Tobacco Cessation No Change   Warm-up and Cool-down Performed as group-led instruction   Resistance Training Performed Yes   VAD Patient? No     Pain Assessment   Currently in Pain? No/denies   Multiple Pain Sites No      Capillary Blood Glucose: No results found for this or any previous visit (from the past 24 hour(s)).      Exercise Prescription Changes - 01/07/17 1200      Response to Exercise   Blood Pressure (Admit) 104/60   Blood Pressure (Exercise) 140/60   Blood Pressure (Exit) 106/68   Heart Rate (Admit) 67 bpm   Heart Rate (Exercise) 95 bpm   Heart Rate (Exit) 63 bpm   Oxygen Saturation (Admit) 100 %   Oxygen Saturation (Exercise) 94 %   Oxygen Saturation (Exit) 100 %   Rating of Perceived Exertion (Exercise) 13   Perceived Dyspnea (Exercise) 1   Duration Progress to 45 minutes of aerobic exercise without signs/symptoms of physical distress   Intensity THRR unchanged     Progression   Progression Continue to progress workloads to maintain intensity without signs/symptoms of physical distress.     Resistance Training   Training Prescription Yes   Weight orange bands   Reps 10-15   Time 10 Minutes     Bike   Level 4   Minutes 17      NuStep   Level 5   Minutes 17   METs 2     Track   Laps 18   Minutes 17      History  Smoking Status  . Former Smoker  . Packs/day: 1.00  . Years: 58.00  . Types: Cigarettes  . Start date: 10/26/1956  . Quit date: 09/23/2014  Smokeless Tobacco  . Never Used    Comment: She only took a break for 2 months    Goals Met:  Exercise tolerated well No report of cardiac concerns or symptoms Strength training completed today  Goals Unmet:  Not Applicable  Comments: Service time is from 10:30a to 12:00p     Dr. Rush Farmer is Medical Director for Pulmonary Rehab at [facility].

## 2017-01-09 ENCOUNTER — Encounter (HOSPITAL_COMMUNITY)
Admission: RE | Admit: 2017-01-09 | Discharge: 2017-01-09 | Disposition: A | Discharge status: Home / Self Care | Payer: Medicare Other | Source: Ambulatory Visit | Attending: Pulmonary Disease | Admitting: Pulmonary Disease

## 2017-01-09 VITALS — Wt 147.7 lb

## 2017-01-09 DIAGNOSIS — J849 Interstitial pulmonary disease, unspecified: Secondary | ICD-10-CM

## 2017-01-09 NOTE — Progress Notes (Signed)
Daily Session Note  Patient Details  Name: Catherine Parks MRN: [id number] Date of Birth: 03/19/1941 Referring Provider:     Pulmonary Rehab Walk Test from 10/31/2016 in [place]  Referring Provider  Dr. Ashok Cordia      Encounter Date: 01/09/2017  Check In:     Session Check In - 01/09/17 1103      Check-In   Location [facility]   Staff Present Trish Fountain, RN, Maxcine Ham, RN, BSN;Jasleen Riepe, RN;Molly diVincenzo, MS, ACSM RCEP, Exercise Physiologist   Supervising physician immediately available to respond to emergencies [facility] immediately available   Physician(s) Dr. Posey Pronto   Medication changes reported     No   Fall or balance concerns reported    No   Tobacco Cessation No Change   Warm-up and Cool-down Performed as group-led instruction   Resistance Training Performed Yes   VAD Patient? No     Pain Assessment   Currently in Pain? No/denies   Multiple Pain Sites No      Capillary Blood Glucose: No results found for this or any previous visit (from the past 24 hour(s)).      Exercise Prescription Changes - 01/09/17 1200      Response to Exercise   Blood Pressure (Admit) 108/60   Blood Pressure (Exercise) 148/70   Blood Pressure (Exit) 110/60   Heart Rate (Admit) 58 bpm   Heart Rate (Exercise) 89 bpm   Heart Rate (Exit) 60 bpm   Oxygen Saturation (Admit) 100 %   Oxygen Saturation (Exercise) 96 %   Oxygen Saturation (Exit) 100 %   Rating of Perceived Exertion (Exercise) 13   Perceived Dyspnea (Exercise) 1   Duration Progress to 45 minutes of aerobic exercise without signs/symptoms of physical distress   Intensity THRR unchanged     Progression   Progression Continue to progress workloads to maintain intensity without signs/symptoms of physical distress.     Resistance Training   Training Prescription Yes   Weight orange bands   Reps 10-15   Time 10 Minutes     Bike   Level 4   Minutes 17      Track   Laps 16   Minutes 17      History  Smoking Status  . Former Smoker  . Packs/day: 1.00  . Years: 58.00  . Types: Cigarettes  . Start date: 10/26/1956  . Quit date: 09/23/2014  Smokeless Tobacco  . Never Used    Comment: She only took a break for 2 months    Goals Met:  Exercise tolerated well No report of cardiac concerns or symptoms Strength training completed today  Goals Unmet:  Not Applicable  Comments: Service time is from 1030 to 1225 Pt attended Doctor Day today.    Dr. Rush Farmer is Medical Director for Pulmonary Rehab at [facility].

## 2017-01-14 ENCOUNTER — Encounter (HOSPITAL_COMMUNITY)
Admission: RE | Admit: 2017-01-14 | Discharge: 2017-01-14 | Disposition: A | Discharge status: Home / Self Care | Payer: Medicare Other | Source: Ambulatory Visit | Attending: Pulmonary Disease | Admitting: Pulmonary Disease

## 2017-01-14 VITALS — Wt 147.3 lb

## 2017-01-14 DIAGNOSIS — J449 Chronic obstructive pulmonary disease, unspecified: Secondary | ICD-10-CM | POA: Insufficient documentation

## 2017-01-14 DIAGNOSIS — J849 Interstitial pulmonary disease, unspecified: Secondary | ICD-10-CM | POA: Diagnosis not present

## 2017-01-14 DIAGNOSIS — R911 Solitary pulmonary nodule: Secondary | ICD-10-CM | POA: Insufficient documentation

## 2017-01-14 NOTE — Progress Notes (Signed)
Daily Session Note  Patient Details  Name: Catherine Parks MRN: [id number] Date of Birth: Aug 25, 1941 Referring Provider:     Pulmonary Rehab Walk Test from 10/31/2016 in [place]  Referring Provider  Dr. Ashok Cordia      Encounter Date: 01/14/2017  Check In:     Session Check In - 01/14/17 1030      Check-In   Location [facility]   Staff Present Rosebud Poles, RN, BSN;Molly diVincenzo, MS, ACSM RCEP, Exercise Physiologist;Lisa Ysidro Evert, RN;Richell Corker Rollene Rotunda, RN, BSN   Supervising physician immediately available to respond to emergencies [facility] immediately available   Physician(s) Dr, Posey Pronto   Medication changes reported     No   Fall or balance concerns reported    No   Tobacco Cessation No Change   Warm-up and Cool-down Performed as group-led instruction   Resistance Training Performed Yes   VAD Patient? No     Pain Assessment   Currently in Pain? No/denies   Multiple Pain Sites No      Capillary Blood Glucose: No results found for this or any previous visit (from the past 24 hour(s)).      Exercise Prescription Changes - 01/14/17 1215      Response to Exercise   Blood Pressure (Admit) 120/72   Blood Pressure (Exercise) 120/80   Blood Pressure (Exit) 92/70  increased to 118/64   Heart Rate (Admit) 57 bpm   Heart Rate (Exercise) 93 bpm   Heart Rate (Exit) 64 bpm   Oxygen Saturation (Admit) 100 %   Oxygen Saturation (Exercise) 97 %   Oxygen Saturation (Exit) 98 %   Rating of Perceived Exertion (Exercise) 13   Perceived Dyspnea (Exercise) 1   Duration Progress to 45 minutes of aerobic exercise without signs/symptoms of physical distress   Intensity THRR unchanged     Progression   Progression Continue to progress workloads to maintain intensity without signs/symptoms of physical distress.     Resistance Training   Training Prescription Yes   Weight orange bands   Reps 10-15   Time 10 Minutes     Bike   Level 4   Minutes 17     NuStep   Level 5   Minutes 17   METs 2.3     Track   Laps 17   Minutes 17      History  Smoking Status  . Former Smoker  . Packs/day: 1.00  . Years: 58.00  . Types: Cigarettes  . Start date: 10/26/1956  . Quit date: 09/23/2014  Smokeless Tobacco  . Never Used    Comment: She only took a break for 2 months    Goals Met:  Independence with exercise equipment Improved SOB with ADL's Using PLB without cueing & demonstrates good technique Exercise tolerated well No report of cardiac concerns or symptoms Strength training completed today  Goals Unmet:  Not Applicable  Comments: Service time is from 1030 to 1210   Dr. Rush Farmer is Medical Director for Pulmonary Rehab at [facility].

## 2017-01-16 ENCOUNTER — Encounter (HOSPITAL_COMMUNITY)
Admission: RE | Admit: 2017-01-16 | Discharge: 2017-01-16 | Disposition: A | Discharge status: Home / Self Care | Payer: Medicare Other | Source: Ambulatory Visit | Attending: Pulmonary Disease | Admitting: Pulmonary Disease

## 2017-01-16 VITALS — Wt 146.2 lb

## 2017-01-16 DIAGNOSIS — J849 Interstitial pulmonary disease, unspecified: Secondary | ICD-10-CM

## 2017-01-16 NOTE — Progress Notes (Signed)
Daily Session Note  Patient Details  Name: Catherine Parks MRN: [id number] Date of Birth: Feb 09, 1941 Referring Provider:     Pulmonary Rehab Walk Test from 10/31/2016 in [place]  Referring Provider  Dr. Ashok Cordia      Encounter Date: 01/16/2017  Check In:     Session Check In - 01/16/17 1109      Check-In   Location [facility]   Staff Present Rodney Langton, RN;Joan Leonia Reeves, RN, Luisa Hart, RN, BSN   Supervising physician immediately available to respond to emergencies [facility] immediately available   Physician(s) Dr. Sloan Leiter   Medication changes reported     No   Fall or balance concerns reported    No   Tobacco Cessation No Change   Warm-up and Cool-down Performed as group-led instruction   Resistance Training Performed Yes   VAD Patient? No     Pain Assessment   Currently in Pain? No/denies   Multiple Pain Sites No      Capillary Blood Glucose: No results found for this or any previous visit (from the past 24 hour(s)).      Exercise Prescription Changes - 01/16/17 1200      Response to Exercise   Blood Pressure (Admit) 122/64   Blood Pressure (Exercise) 154/84   Blood Pressure (Exit) 118/64   Heart Rate (Admit) 70 bpm   Heart Rate (Exercise) 64 bpm   Heart Rate (Exit) 67 bpm   Oxygen Saturation (Admit) 99 %   Oxygen Saturation (Exercise) 99 %   Oxygen Saturation (Exit) 97 %   Rating of Perceived Exertion (Exercise) 13   Perceived Dyspnea (Exercise) 1   Duration Progress to 45 minutes of aerobic exercise without signs/symptoms of physical distress   Intensity THRR unchanged     Progression   Progression Continue to progress workloads to maintain intensity without signs/symptoms of physical distress.     Resistance Training   Training Prescription Yes   Weight orange bands   Reps 10-15   Time 10 Minutes     Bike   Level 4   Minutes 17     NuStep   Level 5   Minutes 17   METs 2.3       History  Smoking Status  . Former Smoker  . Packs/day: 1.00  . Years: 58.00  . Types: Cigarettes  . Start date: 10/26/1956  . Quit date: 09/23/2014  Smokeless Tobacco  . Never Used    Comment: She only took a break for 2 months    Goals Met:  Exercise tolerated well No report of cardiac concerns or symptoms Strength training completed today  Goals Unmet:  Not Applicable  Comments: Service time is from 1030 to 1220    Dr. Rush Farmer is Medical Director for Pulmonary Rehab at [facility].

## 2017-01-21 ENCOUNTER — Encounter (HOSPITAL_COMMUNITY)
Admission: RE | Admit: 2017-01-21 | Discharge: 2017-01-21 | Disposition: A | Discharge status: Home / Self Care | Payer: Medicare Other | Source: Ambulatory Visit | Attending: Pulmonary Disease | Admitting: Pulmonary Disease

## 2017-01-21 VITALS — Wt 147.3 lb

## 2017-01-21 DIAGNOSIS — J849 Interstitial pulmonary disease, unspecified: Secondary | ICD-10-CM | POA: Diagnosis not present

## 2017-01-21 NOTE — Progress Notes (Signed)
Pulmonary Individual Treatment Plan  Patient Details  Name: Catherine Parks MRN: [id number] Date of Birth: [birth year] Referring Provider:     Pulmonary Rehab Walk Test from 10/31/2016 in [place]  Referring Provider  Dr. Ashok Cordia      Initial Encounter Date:    Pulmonary Rehab Walk Test from 10/31/2016 in [facility]  Date  10/31/16  Referring Provider  Dr. Ashok Cordia      Visit Diagnosis: ILD (interstitial lung disease) ([place])  Patient's Home Medications on Admission:   Current Outpatient Prescriptions:  .  acetaminophen (TYLENOL) 500 MG tablet, Take 500 mg by mouth every 6 (six) hours as needed (pain)., Disp: , Rfl:  .  amLODipine (NORVASC) 10 MG tablet, TAKE 1 TABLET BY MOUTH EVERY DAY FOR BLOOD PRESSURE, Disp: 90 tablet, Rfl: 3 .  aspirin EC 81 MG tablet, Take 81 mg by mouth 2 (two) times a week.  , Disp: , Rfl:  .  Calcium Citrate (CAL-CITRATE PO), Take by mouth., Disp: , Rfl:  .  cholecalciferol (VITAMIN D) 1000 units tablet, Take 4,000 Units by mouth daily after lunch., Disp: , Rfl:  .  cloNIDine (CATAPRES) 0.2 MG tablet, TAKE 1 TABLET BY MOUTH TWICE A DAY FOR BLOOD PRESSURE, Disp: 180 tablet, Rfl: 1 .  Flaxseed, Linseed, (FLAX SEED OIL) 1000 MG CAPS, Take 1,000 mg by mouth daily after lunch. , Disp: , Rfl:  .  halobetasol (ULTRAVATE) 0.05 % cream, Apply topically 2 (two) times daily., Disp: 50 g, Rfl: 0 .  Magnesium 500 MG TABS, Take 500 mg by mouth., Disp: , Rfl:  .  Naphazoline HCl (CLEAR EYES OP), Place 1 drop into both eyes daily., Disp: , Rfl:  .  Psyllium (METAMUCIL PO), Take 5 mLs by mouth daily after lunch. Mix in 8 oz liquid and drink, Disp: , Rfl:  .  ranitidine (ZANTAC) 300 MG tablet, Take 1 tablet (300 mg total) by mouth at bedtime. PRN, Disp: 90 tablet, Rfl: 1 .  traMADol (ULTRAM) 50 MG tablet, Take 1 tablet (50 mg total) by mouth every 12 (twelve) hours as needed for moderate pain. 1 pill twice daily as  needed for pain, Disp: 60 tablet, Rfl: 0 .  triamterene-hydrochlorothiazide (MAXZIDE-25) 37.5-25 MG tablet, TAKE 1 TABLET BY MOUTH DAILY., Disp: 90 tablet, Rfl: 1  Past Medical History: Past Medical History:  Diagnosis Date  . Allergic rhinitis   . Arthritis   . Bronchiectasis ([place])   . Colon polyps   . Diverticulosis   . Emphysema of lung ([place])   . GERD (gastroesophageal reflux disease)   . HSV-1 (herpes simplex virus 1) infection   . Hypertension     Tobacco Use: History  Smoking Status  . Former Smoker  . Packs/day: 1.00  . Years: 58.00  . Types: Cigarettes  . Start date: 10/26/1956  . Quit date: 09/23/2014  Smokeless Tobacco  . Never Used    Comment: She only took a break for 2 months    Labs: Recent Review Flowsheet Data    Labs for ITP Cardiac and Pulmonary Rehab Latest Ref Rng & Units 04/10/2015 08/04/2015 02/28/2016 06/07/2016 09/17/2016   Cholestrol <200 mg/dL 204(H) 197 221(H) 207(H) 211(H)   LDLCALC <100 mg/dL 102 108 132(H) 129 116(H)   HDL >50 mg/dL 85 73 73 65 76   Trlycerides <150 mg/dL 83 80 79 66 94   Hemoglobin A1c <5.7 % 6.0(H) 5.9(H) 6.1(H) 5.7(H) 5.6  Capillary Blood Glucose: Lab Results  Component Value Date   GLUCAP 83 11/19/2015   GLUCAP 107 (H) 11/01/2014     ADL UCSD:   Pulmonary Function Assessment:   Exercise Target Goals:    Exercise Program Goal: Individual exercise prescription set with THRR, safety & activity barriers. Participant demonstrates ability to understand and report RPE using BORG scale, to self-measure pulse accurately, and to acknowledge the importance of the exercise prescription.  Exercise Prescription Goal: Starting with aerobic activity 30 plus minutes a day, 3 days per week for initial exercise prescription. Provide home exercise prescription and guidelines that participant acknowledges understanding prior to discharge.  Activity Barriers & Risk Stratification:     Activity Barriers & Cardiac Risk  Stratification - 10/28/16 1015      Activity Barriers & Cardiac Risk Stratification   Activity Barriers Arthritis;Shortness of Breath      6 Minute Walk:     6 Minute Walk    Row Name 10/31/16 1513         6 Minute Walk   Phase Initial     Distance 1175 feet     Walk Time 6 minutes     # of Rest Breaks 0     MPH 2.22     METS 2.68     RPE 12     Perceived Dyspnea  3     Symptoms No     Resting HR 80 bpm     Resting BP 124/84     Max Ex. HR 105 bpm     Max Ex. BP 140/80       Interval HR   Baseline HR 80     1 Minute HR 95     2 Minute HR 85     3 Minute HR 92     4 Minute HR 94     5 Minute HR 105     6 Minute HR 104     2 Minute Post HR 95     Interval Heart Rate? Yes       Interval Oxygen   Interval Oxygen? Yes     Baseline Oxygen Saturation % 100 %     Baseline Liters of Oxygen 0 L     1 Minute Oxygen Saturation % 94 %     1 Minute Liters of Oxygen 0 L     2 Minute Oxygen Saturation % 85 %  questionable reading     2 Minute Liters of Oxygen 0 L     3 Minute Oxygen Saturation % 100 %     3 Minute Liters of Oxygen 0 L     4 Minute Oxygen Saturation % 95 %     4 Minute Liters of Oxygen 0 L     5 Minute Oxygen Saturation % 94 %     5 Minute Liters of Oxygen 0 L     6 Minute Oxygen Saturation % 94 %     6 Minute Liters of Oxygen 0 L     2 Minute Post Oxygen Saturation % 96 %     2 Minute Post Liters of Oxygen 0 L        Oxygen Initial Assessment:     Oxygen Initial Assessment - 11/25/16 1054      Home Oxygen   Home Oxygen Device None      Oxygen Re-Evaluation:     Oxygen Re-Evaluation    Row Name 12/24/16 [phone] 01/21/17 1443  Program Oxygen Prescription   Program Oxygen Prescription None None        Home Oxygen   Home Oxygen Device None None         Oxygen Discharge (Final Oxygen Re-Evaluation):     Oxygen Re-Evaluation - 01/21/17 1443      Program Oxygen Prescription   Program Oxygen Prescription None     Home  Oxygen   Home Oxygen Device None      Initial Exercise Prescription:     Initial Exercise Prescription - 10/31/16 1500      Date of Initial Exercise RX and Referring Provider   Date 10/31/16   Referring Provider Dr. Vevelyn Royals   Level 0.5   Minutes 17     NuStep   Level 2   Minutes 17   METs 1.5     Track   Laps 8   Minutes 17     Prescription Details   Frequency (times per week) 2   Duration Progress to 45 minutes of aerobic exercise without signs/symptoms of physical distress     Intensity   THRR 40-80% of Max Heartrate 58-115   Ratings of Perceived Exertion 11-13   Perceived Dyspnea 0-4     Progression   Progression Continue progressive overload as per policy without signs/symptoms or physical distress.     Resistance Training   Training Prescription Yes   Weight orange bands   Reps 10-12      Perform Capillary Blood Glucose checks as needed.  Exercise Prescription Changes:     Exercise Prescription Changes    Row Name 11/07/16 1200 11/12/16 1200 11/14/16 1200 11/19/16 1200 11/26/16 1200     Response to Exercise   Blood Pressure (Admit) 120/84 134/84 116/62 130/70 128/60   Blood Pressure (Exercise) 156/84 150/74 134/74 124/60 138/80   Blood Pressure (Exit) 108/66 104/72 100/70 104/60 110/74   Heart Rate (Admit) 79 bpm 71 bpm 80 bpm 62 bpm 74 bpm   Heart Rate (Exercise) 110 bpm 97 bpm 98 bpm 129 bpm 116 bpm   Heart Rate (Exit) 78 bpm 79 bpm 75 bpm 69 bpm 83 bpm   Oxygen Saturation (Admit) 100 % 100 % 100 % 100 % 100 %   Oxygen Saturation (Exercise) 100 % 99 % 98 % 97 % 96 %   Oxygen Saturation (Exit) 99 % 100 % 100 % 99 % 100 %   Rating of Perceived Exertion (Exercise) '15 15 13 15 13   '$ Perceived Dyspnea (Exercise) 2 1 0 1 1   Duration Progress to 45 minutes of aerobic exercise without signs/symptoms of physical distress Progress to 45 minutes of aerobic exercise without signs/symptoms of physical distress Progress to 45 minutes of aerobic  exercise without signs/symptoms of physical distress Progress to 45 minutes of aerobic exercise without signs/symptoms of physical distress Progress to 45 minutes of aerobic exercise without signs/symptoms of physical distress   Intensity Other (comment) THRR unchanged THRR unchanged THRR unchanged THRR unchanged     Progression   Progression -  40-80% of HRR Continue to progress workloads to maintain intensity without signs/symptoms of physical distress. Continue to progress workloads to maintain intensity without signs/symptoms of physical distress. Continue to progress workloads to maintain intensity without signs/symptoms of physical distress. Continue to progress workloads to maintain intensity without signs/symptoms of physical distress.     Resistance Training   Training Prescription Yes Yes Yes Yes Yes   Weight orange bands orange bands orange bands  orange bands orange bands   Reps 10-12  10 mniutes of strength training 10-15 10-15 10-15 10-15   Time  - 10 Minutes 10 Minutes 10 Minutes 10 Minutes     Bike   Level 0.5 0.5  - 0.5 0.5   Minutes 17 17  - 17 17     NuStep   Level  -  - '2 4 3   '$ Minutes  -  - '17 17 17   '$ METs  -  - 1.8 2 2.6     Arm Ergometer   Level  - 1  -  -  -   Minutes  - 17  -  -  -     Track   Laps '16 15 12 15 16   '$ Minutes '17 17 17 17 17   '$ Row Name 11/28/16 1300 12/03/16 1200 12/05/16 1200 12/10/16 1200 12/12/16 1300     Response to Exercise   Blood Pressure (Admit) 118/64 106/66 144/68 108/88 116/64   Blood Pressure (Exercise) 130/78 120/70 130/70 110/60 120/70   Blood Pressure (Exit) 98/64 100/62 98/64 116/62 110/60   Heart Rate (Admit) 70 bpm 74 bpm 64 bpm 69 bpm 77 bpm   Heart Rate (Exercise) 100 bpm 100 bpm 92 bpm 106 bpm 90 bpm   Heart Rate (Exit) 82 bpm 70 bpm 71 bpm 68 bpm 68 bpm   Oxygen Saturation (Admit) 100 % 98 % 100 % 100 % 98 %   Oxygen Saturation (Exercise) 99 % 98 % 99 % 95 % 99 %   Oxygen Saturation (Exit) 97 % 99 % 100 % 100 % 100 %    Rating of Perceived Exertion (Exercise) '13 13 13 13 13   '$ Perceived Dyspnea (Exercise) '1 1 1 1 1   '$ Duration Progress to 45 minutes of aerobic exercise without signs/symptoms of physical distress Progress to 45 minutes of aerobic exercise without signs/symptoms of physical distress Progress to 45 minutes of aerobic exercise without signs/symptoms of physical distress Progress to 45 minutes of aerobic exercise without signs/symptoms of physical distress Progress to 45 minutes of aerobic exercise without signs/symptoms of physical distress   Intensity THRR unchanged THRR unchanged THRR unchanged THRR unchanged THRR unchanged     Progression   Progression Continue to progress workloads to maintain intensity without signs/symptoms of physical distress. Continue to progress workloads to maintain intensity without signs/symptoms of physical distress. Continue to progress workloads to maintain intensity without signs/symptoms of physical distress. Continue to progress workloads to maintain intensity without signs/symptoms of physical distress. Continue to progress workloads to maintain intensity without signs/symptoms of physical distress.     Resistance Training   Training Prescription Yes Yes Yes Yes Yes   Weight orange bands orange bands orange bands orange bands orange bands   Reps 10-15 10-15 10-15 10-15 10-15   Time 10 Minutes 10 Minutes 10 Minutes 10 Minutes 10 Minutes     Bike   Level 0.5 3  - 3  -   Minutes 17 17  - 17  -     NuStep   Level '3 4 4 4 4   '$ Minutes '17 17 17 17 17   '$ METs 2.5 2.1 2.7 2.9 2.9     Track   Laps  - '16 14 12 16   '$ Minutes  - '17 17 17 17   '$ Row Name 12/17/16 1200 12/19/16 1200 12/24/16 1200 12/26/16 1200 12/31/16 1200     Response to Exercise   Blood Pressure (Admit)  128/68 126/62 112/64 110/68 106/60   Blood Pressure (Exercise) 120/70 144/80 140/64 132/70 136/74   Blood Pressure (Exit) 104/62 108/68 108/60 102/70 104/64   Heart Rate (Admit) 61 bpm 63 bpm 66 bpm  60 bpm 67 bpm   Heart Rate (Exercise) 100 bpm 108 bpm 93 bpm 89 bpm 128 bpm   Heart Rate (Exit) 75 bpm 73 bpm 70 bpm 66 bpm 76 bpm   Oxygen Saturation (Admit) 100 % 100 % 100 % 99 % 100 %   Oxygen Saturation (Exercise) 98 % 99 % 98 % 98 % 98 %   Oxygen Saturation (Exit) 97 % 100 % 99 % 97 % 100 %   Rating of Perceived Exertion (Exercise) '13 13 13 11 13   '$ Perceived Dyspnea (Exercise) '1 1 1 1 1   '$ Duration Progress to 45 minutes of aerobic exercise without signs/symptoms of physical distress Progress to 45 minutes of aerobic exercise without signs/symptoms of physical distress Progress to 45 minutes of aerobic exercise without signs/symptoms of physical distress Progress to 45 minutes of aerobic exercise without signs/symptoms of physical distress Progress to 45 minutes of aerobic exercise without signs/symptoms of physical distress   Intensity THRR unchanged THRR unchanged THRR unchanged THRR unchanged THRR unchanged     Progression   Progression Continue to progress workloads to maintain intensity without signs/symptoms of physical distress. Continue to progress workloads to maintain intensity without signs/symptoms of physical distress. Continue to progress workloads to maintain intensity without signs/symptoms of physical distress. Continue to progress workloads to maintain intensity without signs/symptoms of physical distress. Continue to progress workloads to maintain intensity without signs/symptoms of physical distress.     Resistance Training   Training Prescription Yes Yes Yes Yes Yes   Weight orange bands orange bands orange bands orange bands orange bands   Reps 10-15 10-15 10-15 10-15 10-15   Time 10 Minutes 10 Minutes 10 Minutes 10 Minutes 10 Minutes     Bike   Level '3 4 4  '$ - 4   Minutes '17 17 17  '$ - 17     NuStep   Level 5  - '5 5 5   '$ Minutes 17  - '17 17 17   '$ METs 2.6  - 2.8 2.4 2.5     Track   Laps '16 16 18 17 17   '$ Minutes '17 17 17 17 17     '$ Home Exercise Plan   Plans to  continue exercise at  -  -  -  - Home (comment)   Frequency  -  -  -  - Add 3 additional days to program exercise sessions.   Row Name 01/02/17 1200 01/07/17 1200 01/09/17 1200 01/14/17 1215 01/16/17 1200     Response to Exercise   Blood Pressure (Admit) 132/72 104/60 108/60 120/72 122/64   Blood Pressure (Exercise) 148/82 140/60 148/70 120/80 154/84   Blood Pressure (Exit) 120/70 106/68 110/60 92/70  increased to 118/64 118/64   Heart Rate (Admit) 90 bpm 67 bpm 58 bpm 57 bpm 70 bpm   Heart Rate (Exercise) 91 bpm 95 bpm 89 bpm 93 bpm 64 bpm   Heart Rate (Exit) 71 bpm 63 bpm 60 bpm 64 bpm 67 bpm   Oxygen Saturation (Admit) 100 % 100 % 100 % 100 % 99 %   Oxygen Saturation (Exercise) 99 % 94 % 96 % 97 % 99 %   Oxygen Saturation (Exit) 97 % 100 % 100 % 98 % 97 %   Rating of  Perceived Exertion (Exercise) '13 13 13 13 13   '$ Perceived Dyspnea (Exercise) '1 1 1 1 1   '$ Duration Progress to 45 minutes of aerobic exercise without signs/symptoms of physical distress Progress to 45 minutes of aerobic exercise without signs/symptoms of physical distress Progress to 45 minutes of aerobic exercise without signs/symptoms of physical distress Progress to 45 minutes of aerobic exercise without signs/symptoms of physical distress Progress to 45 minutes of aerobic exercise without signs/symptoms of physical distress   Intensity THRR unchanged THRR unchanged THRR unchanged THRR unchanged THRR unchanged     Progression   Progression Continue to progress workloads to maintain intensity without signs/symptoms of physical distress. Continue to progress workloads to maintain intensity without signs/symptoms of physical distress. Continue to progress workloads to maintain intensity without signs/symptoms of physical distress. Continue to progress workloads to maintain intensity without signs/symptoms of physical distress. Continue to progress workloads to maintain intensity without signs/symptoms of physical distress.      Resistance Training   Training Prescription Yes Yes Yes Yes Yes   Weight orange bands orange bands orange bands orange bands orange bands   Reps 10-15 10-15 10-15 10-15 10-15   Time 10 Minutes 10 Minutes 10 Minutes 10 Minutes 10 Minutes     Bike   Level '4 4 4 4 4   '$ Minutes '17 17 17 17 17     '$ NuStep   Level 5 5  - 5 5   Minutes 17 17  - 17 17   METs 2.5 2  - 2.3 2.3     Track   Laps  - '18 16 17  '$ -   Minutes  - '17 17 17  '$ -   Row Name 01/21/17 1200             Response to Exercise   Blood Pressure (Admit) 106/60       Blood Pressure (Exercise) 130/82       Blood Pressure (Exit) 110/64       Heart Rate (Admit) 67 bpm       Heart Rate (Exercise) 89 bpm       Heart Rate (Exit) 73 bpm       Oxygen Saturation (Admit) 100 %       Oxygen Saturation (Exercise) 99 %       Oxygen Saturation (Exit) 100 %       Rating of Perceived Exertion (Exercise) 12       Perceived Dyspnea (Exercise) 1       Duration Progress to 45 minutes of aerobic exercise without signs/symptoms of physical distress       Intensity THRR unchanged         Progression   Progression Continue to progress workloads to maintain intensity without signs/symptoms of physical distress.         Resistance Training   Training Prescription Yes       Weight orange bands       Reps 10-15       Time 10 Minutes         Interval Training   Interval Training No         Bike   Level 4       Minutes 17         NuStep   Level 5       Minutes 17       METs 2.5         Track   Laps 18  Minutes 17          Exercise Comments:     Exercise Comments    Row Name 01/02/17 0717           Exercise Comments Home exercise completed          Exercise Goals and Review:   Exercise Goals Re-Evaluation :     Exercise Goals Re-Evaluation    Row Name 11/25/16 1618 12/23/16 1622 01/20/17 0747         Exercise Goal Re-Evaluation   Exercise Goals Review Increase Physical Activity;Increase Strenth and  Stamina Increase Physical Activity;Increase Strenth and Stamina Increase Physical Activity;Increase Strenth and Stamina     Comments Patient has only attended four exercise sessions. Will cont. to monitor and progress. Patient is walking up to 16 laps (200 feet each) in 15 minutes. When I asked if she could pick up her pace she said she was not able to because she is limited by her fear of falling. Patient was asked if she needed an assistive device--she declined. Will cont. to monitor and progress as able. Patient is walking up to 17 laps (200 feet each) in 15 minutes. When I asked if she could pick up her pace she said she was not able to because she is limited by her fear of falling. Patient was asked if she needed an assistive device--she declined. Will reinforce the importance of home exercise. Will cont. to monitor and progress as able.     Expected Outcomes Through the exercise here at rehab the patient with increase physical capacity, strength, and stamina.  - Through exercise at rehab and home patient will increase physical capacity, stength, and stamina.        Discharge Exercise Prescription (Final Exercise Prescription Changes):     Exercise Prescription Changes - 01/21/17 1200      Response to Exercise   Blood Pressure (Admit) 106/60   Blood Pressure (Exercise) 130/82   Blood Pressure (Exit) 110/64   Heart Rate (Admit) 67 bpm   Heart Rate (Exercise) 89 bpm   Heart Rate (Exit) 73 bpm   Oxygen Saturation (Admit) 100 %   Oxygen Saturation (Exercise) 99 %   Oxygen Saturation (Exit) 100 %   Rating of Perceived Exertion (Exercise) 12   Perceived Dyspnea (Exercise) 1   Duration Progress to 45 minutes of aerobic exercise without signs/symptoms of physical distress   Intensity THRR unchanged     Progression   Progression Continue to progress workloads to maintain intensity without signs/symptoms of physical distress.     Resistance Training   Training Prescription Yes   Weight  orange bands   Reps 10-15   Time 10 Minutes     Interval Training   Interval Training No     Bike   Level 4   Minutes 17     NuStep   Level 5   Minutes 17   METs 2.5     Track   Laps 18   Minutes 17      Nutrition:  Target Goals: Understanding of nutrition guidelines, daily intake of sodium '1500mg'$ , cholesterol '200mg'$ , calories 30% from fat and 7% or less from saturated fats, daily to have 5 or more servings of fruits and vegetables.  Biometrics:     Pre Biometrics - 10/28/16 1028      Pre Biometrics   Grip Strength 20 kg       Nutrition Therapy Plan and Nutrition Goals:     Nutrition Therapy &  Goals - 12/10/16 1213      Nutrition Therapy   Diet Therapeutic Lifestyle Changes     Personal Nutrition Goals   Nutrition Goal Pt to increase consumption of fish, especially fish high in omega 3 fats such as salmon, tuna, sardines, to 2-3 times per week.     Intervention Plan   Intervention Prescribe, educate and counsel regarding individualized specific dietary modifications aiming towards targeted core components such as weight, hypertension, lipid management, diabetes, heart failure and other comorbidities.   Expected Outcomes Short Term Goal: Understand basic principles of dietary content, such as calories, fat, sodium, cholesterol and nutrients.;Long Term Goal: Adherence to prescribed nutrition plan.      Nutrition Discharge: Rate Your Plate Scores:     Nutrition Assessments - 12/10/16 1213      Rate Your Plate Scores   Pre Score 56      Nutrition Goals Re-Evaluation:   Nutrition Goals Discharge (Final Nutrition Goals Re-Evaluation):   Psychosocial: Target Goals: Acknowledge presence or absence of significant depression and/or stress, maximize coping skills, provide positive support system. Participant is able to verbalize types and ability to use techniques and skills needed for reducing stress and depression.  Initial Review & Psychosocial  Screening:     Initial Psych Review & Screening - 10/28/16 [place]? Yes     Barriers   Psychosocial barriers to participate in program There are no identifiable barriers or psychosocial needs.;The patient should benefit from training in stress management and relaxation.     Screening Interventions   Interventions Encouraged to exercise      Quality of Life Scores:   PHQ-9: Recent Review Flowsheet Data    Depression screen [facility] 2/9 12/18/2016 10/28/2016 09/17/2016 09/14/2016 11/22/2015   Decreased Interest 0 1 0 0 0   Down, Depressed, Hopeless 0 0 0 0 0   PHQ - 2 Score 0 1 0 0 0     Interpretation of Total Score  Total Score Depression Severity:  1-4 = Minimal depression, 5-9 = Mild depression, 10-14 = Moderate depression, 15-19 = Moderately severe depression, 20-27 = Severe depression   Psychosocial Evaluation and Intervention:     Psychosocial Evaluation - 10/28/16 1035      Psychosocial Evaluation & Interventions   Interventions Encouraged to exercise with the program and follow exercise prescription      Psychosocial Re-Evaluation:     Psychosocial Re-Evaluation    [place] Name 11/25/16 1059 12/24/16 0711 01/21/17 1443         Psychosocial Re-Evaluation   Current issues with None Identified None Identified None Identified     Expected Outcomes  -  - patient will remain free from psychosocial barriers over the next 30 days     Interventions Encouraged to attend Pulmonary Rehabilitation for the exercise Encouraged to attend Pulmonary Rehabilitation for the exercise Encouraged to attend Pulmonary Rehabilitation for the exercise     Continue Psychosocial Services  No Follow up required No Follow up required No Follow up required        Psychosocial Discharge (Final Psychosocial Re-Evaluation):     Psychosocial Re-Evaluation - 01/21/17 1443      Psychosocial Re-Evaluation   Current issues with None Identified   Expected  Outcomes patient will remain free from psychosocial barriers over the next 30 days   Interventions Encouraged to attend Pulmonary Rehabilitation for the exercise   Continue Psychosocial Services  No Follow up required  Education: Education Goals: Education classes will be provided on a weekly basis, covering required topics. Participant will state understanding/return demonstration of topics presented.  Learning Barriers/Preferences:     Learning Barriers/Preferences - 10/28/16 1025      Learning Barriers/Preferences   Learning Barriers None   Learning Preferences Group Instruction;Individual Instruction;Computer/Internet;Verbal Instruction;Written Material      Education Topics: Risk Factor Reduction:  -Group instruction that is supported by a PowerPoint presentation. Instructor discusses the definition of a risk factor, different risk factors for pulmonary disease, and how the heart and lungs work together.     PULMONARY REHAB OTHER RESPIRATORY from 01/16/2017 in [place]  Date  12/05/16  Educator  EP  Instruction Review Code  2- meets goals/outcomes      Nutrition for Pulmonary Patient:  -Group instruction provided by PowerPoint slides, verbal discussion, and written materials to support subject matter. The instructor gives an explanation and review of healthy diet recommendations, which includes a discussion on weight management, recommendations for fruit and vegetable consumption, as well as protein, fluid, caffeine, fiber, sodium, sugar, and alcohol. Tips for eating when patients are short of breath are discussed.   PULMONARY REHAB OTHER RESPIRATORY from 01/16/2017 in [place]  Date  11/28/16  Educator  RD  Instruction Review Code  2- meets goals/outcomes      Pursed Lip Breathing:  -Group instruction that is supported by demonstration and informational handouts. Instructor discusses the benefits of  pursed lip and diaphragmatic breathing and detailed demonstration on how to preform both.     Oxygen Safety:  -Group instruction provided by PowerPoint, verbal discussion, and written material to support subject matter. There is an overview of "What is Oxygen" and "Why do we need it".  Instructor also reviews how to create a safe environment for oxygen use, the importance of using oxygen as prescribed, and the risks of noncompliance. There is a brief discussion on traveling with oxygen and resources the patient may utilize.   PULMONARY REHAB OTHER RESPIRATORY from 01/16/2017 in [place]  Date  12/19/16  Educator  Truddie Crumble  Instruction Review Code  2- meets goals/outcomes      Oxygen Equipment:  -Group instruction provided by [employer] Staff utilizing handouts, written materials, and equipment demonstrations.   Signs and Symptoms:  -Group instruction provided by written material and verbal discussion to support subject matter. Warning signs and symptoms of infection, stroke, and heart attack are reviewed and when to call the physician/911 reinforced. Tips for preventing the spread of infection discussed.   PULMONARY REHAB OTHER RESPIRATORY from 01/16/2017 in [place]  Date  01/16/17  Educator  rn  Instruction Review Code  2- meets goals/outcomes      Advanced Directives:  -Group instruction provided by verbal instruction and written material to support subject matter. Instructor reviews Advanced Directive laws and proper instruction for filling out document.   Pulmonary Video:  -Group video education that reviews the importance of medication and oxygen compliance, exercise, good nutrition, pulmonary hygiene, and pursed lip and diaphragmatic breathing for the pulmonary patient.   PULMONARY REHAB OTHER RESPIRATORY from 01/16/2017 in [place]  Date  12/26/16  Instruction Review Code  R-  Review/reinforce      Exercise for the Pulmonary Patient:  -Group instruction that is supported by a PowerPoint presentation. Instructor discusses benefits of exercise, core components of exercise, frequency, duration, and  intensity of an exercise routine, importance of utilizing pulse oximetry during exercise, safety while exercising, and options of places to exercise outside of rehab.     PULMONARY REHAB OTHER RESPIRATORY from 01/16/2017 in [place]  Date  01/02/17  Educator  ep  Instruction Review Code  R- Review/reinforce      Pulmonary Medications:  -Verbally interactive group education provided by instructor with focus on inhaled medications and proper administration.   PULMONARY REHAB OTHER RESPIRATORY from 01/16/2017 in [place]  Date  12/12/16  Educator  pharm  Instruction Review Code  2- meets goals/outcomes      Anatomy and Physiology of the Respiratory System and Intimacy:  -Group instruction provided by PowerPoint, verbal discussion, and written material to support subject matter. Instructor reviews respiratory cycle and anatomical components of the respiratory system and their functions. Instructor also reviews differences in obstructive and restrictive respiratory diseases with examples of each. Intimacy, Sex, and Sexuality differences are reviewed with a discussion on how relationships can change when diagnosed with pulmonary disease. Common sexual concerns are reviewed.   Knowledge Questionnaire Score:   Core Components/Risk Factors/Patient Goals at Admission:     Personal Goals and Risk Factors at Admission - 10/28/16 1030      Core Components/Risk Factors/Patient Goals on Admission   Increase Strength and Stamina Yes   Improve shortness of breath with ADL's Yes   Intervention Provide education, individualized exercise plan and daily activity instruction to help decrease symptoms of SOB with  activities of daily living.   Expected Outcomes Short Term: Achieves a reduction of symptoms when performing activities of daily living.   Develop more efficient breathing techniques such as purse lipped breathing and diaphragmatic breathing; and practicing self-pacing with activity Yes   Intervention Provide education, demonstration and support about specific breathing techniuqes utilized for more efficient breathing. Include techniques such as pursed lipped breathing, diaphragmatic breathing and self-pacing activity.   Expected Outcomes Short Term: Participant will be able to demonstrate and use breathing techniques as needed throughout daily activities.   Hypertension Yes   Intervention Provide education on lifestyle modifcations including regular physical activity/exercise, weight management, moderate sodium restriction and increased consumption of fresh fruit, vegetables, and low fat dairy, alcohol moderation, and smoking cessation.;Monitor prescription use compliance.   Expected Outcomes Short Term: Continued assessment and intervention until BP is < 140/56m HG in hypertensive participants. < 130/840mHG in hypertensive participants with diabetes, heart failure or chronic kidney disease.;Long Term: Maintenance of blood pressure at goal levels.      Core Components/Risk Factors/Patient Goals Review:      Goals and Risk Factor Review    Row Name 11/25/16 1056 12/24/16 0711 01/21/17 1443         Core Components/Risk Factors/Patient Goals Review   Personal Goals Review Improve shortness of breath with ADL's;Develop more efficient breathing techniques such as purse lipped breathing and diaphragmatic breathing and practicing self-pacing with activity.;Hypertension Improve shortness of breath with ADL's;Develop more efficient breathing techniques such as purse lipped breathing and diaphragmatic breathing and practicing self-pacing with activity.;Hypertension Improve shortness of breath with  ADL's;Develop more efficient breathing techniques such as purse lipped breathing and diaphragmatic breathing and practicing self-pacing with activity.;Hypertension     Review see comments section on ITP see comments section on ITP see comments section on ITP     Expected Outcomes see admission expected outcomes on ITP see admission expected outcomes on ITP see admission expected outcomes  on ITP        Core Components/Risk Factors/Patient Goals at Discharge (Final Review):      Goals and Risk Factor Review - 01/21/17 1443      Core Components/Risk Factors/Patient Goals Review   Personal Goals Review Improve shortness of breath with ADL's;Develop more efficient breathing techniques such as purse lipped breathing and diaphragmatic breathing and practicing self-pacing with activity.;Hypertension   Review see comments section on ITP   Expected Outcomes see admission expected outcomes on ITP      ITP Comments:   Comments: ITP REVIEW Pt is making expected progress toward pulmonary rehab goals after completing 21 sessions. She verbalizes an increased in her stamina and strength and an improved exercise tolerance. She is able to do more at home with less shortness of breath. Recommend continued exercise, life style modification, education, and utilization of breathing techniques to increase stamina and strength and decrease shortness of breath with exertion.

## 2017-01-21 NOTE — Progress Notes (Signed)
Daily Session Note  Patient Details  Name: Catherine Parks MRN: [id number] Date of Birth: 03/05/1941 Referring Provider:     Pulmonary Rehab Walk Test from 10/31/2016 in [place]  Referring Provider  Dr. Ashok Cordia      Encounter Date: 01/21/2017  Check In:     Session Check In - 01/21/17 1030      Check-In   Location [facility]   Staff Present Rosebud Poles, RN, BSN;Molly diVincenzo, MS, ACSM RCEP, Exercise Physiologist;Lisa Ysidro Evert, RN;Portia Rollene Rotunda, RN, BSN   Supervising physician immediately available to respond to emergencies [facility] immediately available   Physician(s) Dr. Sloan Leiter   Medication changes reported     No   Fall or balance concerns reported    No   Tobacco Cessation No Change   Warm-up and Cool-down Performed as group-led instruction   Resistance Training Performed Yes   VAD Patient? No     Pain Assessment   Currently in Pain? No/denies   Multiple Pain Sites No      Capillary Blood Glucose: No results found for this or any previous visit (from the past 24 hour(s)).      Exercise Prescription Changes - 01/21/17 1200      Response to Exercise   Blood Pressure (Admit) 106/60   Blood Pressure (Exercise) 130/82   Blood Pressure (Exit) 110/64   Heart Rate (Admit) 67 bpm   Heart Rate (Exercise) 89 bpm   Heart Rate (Exit) 73 bpm   Oxygen Saturation (Admit) 100 %   Oxygen Saturation (Exercise) 99 %   Oxygen Saturation (Exit) 100 %   Rating of Perceived Exertion (Exercise) 12   Perceived Dyspnea (Exercise) 1   Duration Progress to 45 minutes of aerobic exercise without signs/symptoms of physical distress   Intensity THRR unchanged     Progression   Progression Continue to progress workloads to maintain intensity without signs/symptoms of physical distress.     Resistance Training   Training Prescription Yes   Weight orange bands   Reps 10-15   Time 10 Minutes     Interval Training   Interval Training No     Bike   Level 4   Minutes 17     NuStep   Level 5   Minutes 17   METs 2.5     Track   Laps 18   Minutes 17      History  Smoking Status  . Former Smoker  . Packs/day: 1.00  . Years: 58.00  . Types: Cigarettes  . Start date: 10/26/1956  . Quit date: 09/23/2014  Smokeless Tobacco  . Never Used    Comment: She only took a break for 2 months    Goals Met:  Exercise tolerated well Strength training completed today  Goals Unmet:  Not Applicable  Comments: Service time is from 1030 to [place]    Dr. Rush Farmer is Medical Director for Pulmonary Rehab at [facility].

## 2017-01-23 ENCOUNTER — Encounter (HOSPITAL_COMMUNITY)
Admission: RE | Admit: 2017-01-23 | Discharge: 2017-01-23 | Disposition: A | Discharge status: Home / Self Care | Payer: Medicare Other | Source: Ambulatory Visit | Attending: Pulmonary Disease | Admitting: Pulmonary Disease

## 2017-01-23 VITALS — Wt 148.4 lb

## 2017-01-23 DIAGNOSIS — J849 Interstitial pulmonary disease, unspecified: Secondary | ICD-10-CM

## 2017-01-23 NOTE — Progress Notes (Signed)
Daily Session Note  Patient Details  Name: Catherine Parks MRN: [id number] Date of Birth: 01/08/1941 Referring Provider:     Pulmonary Rehab Walk Test from 10/31/2016 in [place]  Referring Provider  Dr. Ashok Cordia      Encounter Date: 01/23/2017  Check In:     Session Check In - 01/23/17 1030      Check-In   Location [facility]   Staff Present Rosebud Poles, RN, BSN;Damonie Furney Ysidro Evert, RN;Portia Rollene Rotunda, RN, BSN   Supervising physician immediately available to respond to emergencies [facility] immediately available   Physician(s) Dr. Wendee Beavers   Medication changes reported     No   Fall or balance concerns reported    No   Tobacco Cessation No Change   Warm-up and Cool-down Performed as group-led instruction   Resistance Training Performed Yes   VAD Patient? No     Pain Assessment   Currently in Pain? No/denies   Multiple Pain Sites No      Capillary Blood Glucose: No results found for this or any previous visit (from the past 24 hour(s)).      Exercise Prescription Changes - 01/23/17 1200      Response to Exercise   Blood Pressure (Admit) 100/60   Blood Pressure (Exercise) 138/82   Blood Pressure (Exit) 100/60   Heart Rate (Admit) 60 bpm   Heart Rate (Exercise) 88 bpm   Heart Rate (Exit) 68 bpm   Oxygen Saturation (Admit) 100 %   Oxygen Saturation (Exercise) 99 %   Oxygen Saturation (Exit) 96 %   Rating of Perceived Exertion (Exercise) 12   Perceived Dyspnea (Exercise) 1   Duration Progress to 45 minutes of aerobic exercise without signs/symptoms of physical distress   Intensity THRR unchanged     Progression   Progression Continue to progress workloads to maintain intensity without signs/symptoms of physical distress.     Resistance Training   Training Prescription Yes   Weight orange bands   Reps 10-15   Time 10 Minutes     Bike   Level 4   Minutes 17     Track   Laps 15   Minutes 17      History   Smoking Status  . Former Smoker  . Packs/day: 1.00  . Years: 58.00  . Types: Cigarettes  . Start date: 10/26/1956  . Quit date: 09/23/2014  Smokeless Tobacco  . Never Used    Comment: She only took a break for 2 months    Goals Met:  Exercise tolerated well No report of cardiac concerns or symptoms Strength training completed today  Goals Unmet:  Not Applicable  Comments: Service time is from 1030 to 1230    Dr. Rush Farmer is Medical Director for Pulmonary Rehab at [facility].

## 2017-01-28 ENCOUNTER — Encounter (HOSPITAL_COMMUNITY)
Admission: RE | Admit: 2017-01-28 | Discharge: 2017-01-28 | Disposition: A | Discharge status: Home / Self Care | Payer: Medicare Other | Source: Ambulatory Visit | Attending: Pulmonary Disease | Admitting: Pulmonary Disease

## 2017-01-28 VITALS — Wt 147.9 lb

## 2017-01-28 DIAGNOSIS — J849 Interstitial pulmonary disease, unspecified: Secondary | ICD-10-CM

## 2017-01-28 NOTE — Progress Notes (Signed)
Daily Session Note  Patient Details  Name: Catherine Parks MRN: [id number] Date of Birth: 07/28/1941 Referring Provider:     Pulmonary Rehab Walk Test from 10/31/2016 in [place]  Referring Provider  Dr. Ashok Cordia      Encounter Date: 01/28/2017  Check In:     Session Check In - 01/28/17 1030      Check-In   Location [facility]   Staff Present Rosebud Poles, RN, BSN;Sincere Berlanga, MS, ACSM RCEP, Exercise Physiologist;Lisa Ysidro Evert, RN;Portia Rollene Rotunda, RN, BSN   Supervising physician immediately available to respond to emergencies [facility] immediately available   Physician(s) Dr. Wendee Beavers   Medication changes reported     No   Fall or balance concerns reported    No   Tobacco Cessation No Change   Warm-up and Cool-down Performed as group-led instruction   Resistance Training Performed Yes   VAD Patient? No     Pain Assessment   Currently in Pain? No/denies   Multiple Pain Sites No      Capillary Blood Glucose: No results found for this or any previous visit (from the past 24 hour(s)).      Exercise Prescription Changes - 01/28/17 1200      Response to Exercise   Blood Pressure (Admit) 108/60   Blood Pressure (Exercise) 120/80   Blood Pressure (Exit) 112/70   Heart Rate (Admit) 66 bpm   Heart Rate (Exercise) 107 bpm   Heart Rate (Exit) 75 bpm   Oxygen Saturation (Admit) 100 %   Oxygen Saturation (Exercise) 98 %   Oxygen Saturation (Exit) 99 %   Rating of Perceived Exertion (Exercise) 13   Perceived Dyspnea (Exercise) 1   Duration Progress to 45 minutes of aerobic exercise without signs/symptoms of physical distress   Intensity THRR unchanged     Progression   Progression Continue to progress workloads to maintain intensity without signs/symptoms of physical distress.     Resistance Training   Training Prescription Yes   Weight orange bands   Reps 10-15   Time 10 Minutes     Bike   Level 4   Minutes 17      NuStep   Level 6   Minutes 17   METs 2.6     Track   Laps 17   Minutes 17      History  Smoking Status  . Former Smoker  . Packs/day: 1.00  . Years: 58.00  . Types: Cigarettes  . Start date: 10/26/1956  . Quit date: 09/23/2014  Smokeless Tobacco  . Never Used    Comment: She only took a break for 2 months    Goals Met:  Exercise tolerated well No report of cardiac concerns or symptoms Strength training completed today  Goals Unmet:  Not Applicable  Comments: Service time is from 10:30a to 12:15p    Dr. Rush Farmer is Medical Director for Pulmonary Rehab at [facility].

## 2017-01-29 ENCOUNTER — Ambulatory Visit (INDEPENDENT_AMBULATORY_CARE_PROVIDER_SITE_OTHER)
Admission: RE | Admit: 2017-01-29 | Discharge: 2017-01-29 | Disposition: A | Discharge status: Home / Self Care | Payer: Medicare Other | Source: Ambulatory Visit | Attending: Pulmonary Disease | Admitting: Pulmonary Disease

## 2017-01-29 DIAGNOSIS — J849 Interstitial pulmonary disease, unspecified: Secondary | ICD-10-CM

## 2017-01-30 ENCOUNTER — Encounter (HOSPITAL_COMMUNITY)
Admission: RE | Admit: 2017-01-30 | Discharge: 2017-01-30 | Disposition: A | Discharge status: Home / Self Care | Payer: Medicare Other | Source: Ambulatory Visit | Attending: Pulmonary Disease | Admitting: Pulmonary Disease

## 2017-01-30 DIAGNOSIS — J849 Interstitial pulmonary disease, unspecified: Secondary | ICD-10-CM | POA: Diagnosis not present

## 2017-02-03 ENCOUNTER — Other Ambulatory Visit: Payer: Self-pay | Admitting: Pulmonary Disease

## 2017-02-03 DIAGNOSIS — J849 Interstitial pulmonary disease, unspecified: Secondary | ICD-10-CM

## 2017-02-03 NOTE — Progress Notes (Signed)
Spoke with the patient and informed her of results and recommendations. Pt agreed to have scan performed in November. She verbalized understanding and did not have any questions. Order was placed for scan. Nothing further is needed.

## 2017-02-04 ENCOUNTER — Encounter (HOSPITAL_COMMUNITY): Payer: Medicare Other

## 2017-02-06 ENCOUNTER — Encounter (HOSPITAL_COMMUNITY): Payer: Medicare Other

## 2017-02-11 ENCOUNTER — Encounter (HOSPITAL_COMMUNITY): Payer: Medicare Other

## 2017-02-13 ENCOUNTER — Encounter (HOSPITAL_COMMUNITY): Payer: Medicare Other

## 2017-02-18 ENCOUNTER — Encounter (HOSPITAL_COMMUNITY): Payer: Medicare Other

## 2017-02-20 ENCOUNTER — Encounter (HOSPITAL_COMMUNITY): Payer: Medicare Other

## 2017-02-25 ENCOUNTER — Encounter (HOSPITAL_COMMUNITY): Payer: Medicare Other

## 2017-02-25 ENCOUNTER — Encounter (HOSPITAL_COMMUNITY): Payer: Self-pay

## 2017-02-25 DIAGNOSIS — J849 Interstitial pulmonary disease, unspecified: Secondary | ICD-10-CM

## 2017-02-25 NOTE — Progress Notes (Signed)
Discharge Summary  Patient Details  Name: Catherine Parks MRN: [id number] Date of Birth: 05/14/1941 Referring Provider:     Pulmonary Rehab Walk Test from 10/31/2016 in [place]  Referring Provider  Dr. Ashok Cordia       Number of Visits: 23  Reason for Discharge:  Patient reached a stable level of exercise. Patient independent in their exercise.  Smoking History:  History  Smoking Status  . Former Smoker  . Packs/day: 1.00  . Years: 58.00  . Types: Cigarettes  . Start date: 10/26/1956  . Quit date: 09/23/2014  Smokeless Tobacco  . Never Used    Comment: She only took a break for 2 months    Diagnosis:  ILD (interstitial lung disease) ([place])  ADL [facility]:     Pulmonary Assessment Scores    Row Name 01/29/17 0955         ADL UCSD   ADL Phase Exit     SOB Score total 43        Initial Exercise Prescription:   Discharge Exercise Prescription (Final Exercise Prescription Changes):     Exercise Prescription Changes - 01/28/17 1200      Response to Exercise   Blood Pressure (Admit) 108/60   Blood Pressure (Exercise) 120/80   Blood Pressure (Exit) 112/70   Heart Rate (Admit) 66 bpm   Heart Rate (Exercise) 107 bpm   Heart Rate (Exit) 75 bpm   Oxygen Saturation (Admit) 100 %   Oxygen Saturation (Exercise) 98 %   Oxygen Saturation (Exit) 99 %   Rating of Perceived Exertion (Exercise) 13   Perceived Dyspnea (Exercise) 1   Duration Progress to 45 minutes of aerobic exercise without signs/symptoms of physical distress   Intensity THRR unchanged     Progression   Progression Continue to progress workloads to maintain intensity without signs/symptoms of physical distress.     Resistance Training   Training Prescription Yes   Weight orange bands   Reps 10-15   Time 10 Minutes     Bike   Level 4   Minutes 17     NuStep   Level 6   Minutes 17   METs 2.6     Track   Laps 17   Minutes 17      Functional Capacity:      [place] Name 01/30/17 1359 01/30/17 1613       6 Minute Walk   Phase Discharge Discharge    Distance 1600 feet 1600 feet    Walk Time 6 minutes 6 minutes    # of Rest Breaks 0 0    MPH 3.03 3.03    METS 3.3 3.3    RPE 11 11    Perceived Dyspnea  1 1    Symptoms No No    Resting HR 64 bpm 64 bpm    Resting BP 118/64 118/64    Max Ex. HR 89 bpm 89 bpm    Max Ex. BP 144/70 144/70      Interval HR   Baseline HR 64 64    1 Minute HR 66 66    2 Minute HR 85 85    3 Minute HR 85 85    4 Minute HR 85 85    5 Minute HR 85 85    6 Minute HR 89 89    2 Minute Post HR 84 84    Interval Heart Rate? Yes Yes  Interval Oxygen   Interval Oxygen? Yes Yes    Baseline Oxygen Saturation % 100 % 100 %    Baseline Liters of Oxygen 0 L 0 L    1 Minute Oxygen Saturation % 99 % 99 %    1 Minute Liters of Oxygen 0 L 0 L    2 Minute Oxygen Saturation % 97 % 97 %    2 Minute Liters of Oxygen 0 L 0 L    3 Minute Oxygen Saturation % 96 % 96 %    3 Minute Liters of Oxygen 0 L 0 L    4 Minute Oxygen Saturation % 100 % 100 %    4 Minute Liters of Oxygen 0 L 0 L    5 Minute Oxygen Saturation % 96 % 96 %    5 Minute Liters of Oxygen 0 L 0 L    6 Minute Oxygen Saturation % 100 % 100 %    6 Minute Liters of Oxygen 0 L 0 L    2 Minute Post Oxygen Saturation % 99 % 99 %    2 Minute Post Liters of Oxygen 0 L 0 L       Psychological, QOL, Others - Outcomes: PHQ 2/9: Depression screen [facility] 2/9 01/30/2017 12/18/2016 10/28/2016 09/17/2016 09/14/2016  Decreased Interest 0 0 1 0 0  Down, Depressed, Hopeless 0 0 0 0 0  PHQ - 2 Score 0 0 1 0 0    Quality of Life:     Quality of Life - 01/29/17 0944      Quality of Life Scores   Health/Function Post 22.46 %   Socioeconomic Post 26.78 %   Psych/Spiritual Post 27 %   Family Post 28 %   GLOBAL Post 25.11 %      Personal Goals: Goals established at orientation with interventions provided to work toward goal.    Personal Goals  Discharge:     Goals and Risk Factor Review    Row Name 01/21/17 1443 02/25/17 0746           Core Components/Risk Factors/Patient Goals Review   Personal Goals Review Improve shortness of breath with ADL's;Develop more efficient breathing techniques such as purse lipped breathing and diaphragmatic breathing and practicing self-pacing with activity.;Hypertension  -      Review see comments section on ITP patient me all of her pulmonary rehab goals      Expected Outcomes see admission expected outcomes on ITP  -         Nutrition & Weight - Outcomes:      Post Biometrics - 01/30/17 1613       Post  Biometrics   Grip Strength 31 kg      Nutrition:   Nutrition Discharge:     Nutrition Assessments - 02/11/17 1116      Rate Your Plate Scores   Pre Score 56   Post Score 59      Education Questionnaire Score:     Knowledge Questionnaire Score - 01/29/17 0944      Knowledge Questionnaire Score   Post Score 11/13      Goals reviewed with patient; copy given to patient. Patient plans to continues her home exercise by walking at home

## 2017-02-27 ENCOUNTER — Encounter (HOSPITAL_COMMUNITY): Payer: Medicare Other

## 2017-03-04 ENCOUNTER — Encounter (HOSPITAL_COMMUNITY): Payer: Medicare Other

## 2017-03-27 ENCOUNTER — Ambulatory Visit (INDEPENDENT_AMBULATORY_CARE_PROVIDER_SITE_OTHER): Payer: Medicare Other | Admitting: Internal Medicine

## 2017-03-27 ENCOUNTER — Other Ambulatory Visit: Payer: Self-pay | Admitting: *Deleted

## 2017-03-27 VITALS — BP 108/72 | HR 58 | Temp 97.2°F | Resp 16 | Ht 66.0 in | Wt 146.2 lb

## 2017-03-27 DIAGNOSIS — Z79899 Other long term (current) drug therapy: Secondary | ICD-10-CM | POA: Diagnosis not present

## 2017-03-27 DIAGNOSIS — I10 Essential (primary) hypertension: Secondary | ICD-10-CM

## 2017-03-27 DIAGNOSIS — K219 Gastro-esophageal reflux disease without esophagitis: Secondary | ICD-10-CM

## 2017-03-27 DIAGNOSIS — E782 Mixed hyperlipidemia: Secondary | ICD-10-CM | POA: Diagnosis not present

## 2017-03-27 DIAGNOSIS — E559 Vitamin D deficiency, unspecified: Secondary | ICD-10-CM | POA: Diagnosis not present

## 2017-03-27 DIAGNOSIS — C119 Malignant neoplasm of nasopharynx, unspecified: Secondary | ICD-10-CM | POA: Diagnosis not present

## 2017-03-27 DIAGNOSIS — R7309 Other abnormal glucose: Secondary | ICD-10-CM | POA: Diagnosis not present

## 2017-03-27 DIAGNOSIS — R7303 Prediabetes: Secondary | ICD-10-CM

## 2017-03-27 LAB — CBC WITH DIFFERENTIAL/PLATELET
Basophils Absolute: 0 cells/uL (ref 0–200)
Basophils Relative: 0 %
Eosinophils Absolute: 315 cells/uL (ref 15–500)
Eosinophils Relative: 7 %
HEMATOCRIT: 35.9 % (ref 35.0–45.0)
HEMOGLOBIN: 12 g/dL (ref 11.7–15.5)
LYMPHS ABS: 2025 {cells}/uL (ref 850–3900)
Lymphocytes Relative: 45 %
MCH: 29.4 pg (ref 27.0–33.0)
MCHC: 33.4 g/dL (ref 32.0–36.0)
MCV: 88 fL (ref 80.0–100.0)
MONO ABS: 450 {cells}/uL (ref 200–950)
MPV: 8.4 fL (ref 7.5–12.5)
Monocytes Relative: 10 %
NEUTROS PCT: 38 %
Neutro Abs: 1710 cells/uL (ref 1500–7800)
Platelets: 336 10*3/uL (ref 140–400)
RBC: 4.08 MIL/uL (ref 3.80–5.10)
RDW: 15.4 % — AB (ref 11.0–15.0)
WBC: 4.5 10*3/uL (ref 3.8–10.8)

## 2017-03-27 LAB — LIPID PANEL
CHOL/HDL RATIO: 2.7 ratio (ref <5.0)
CHOLESTEROL: 208 mg/dL — AB (ref <200)
HDL: 76 mg/dL (ref >50)
LDL CALC: 114 mg/dL — AB (ref <100)
TRIGLYCERIDES: 88 mg/dL (ref <150)
VLDL: 18 mg/dL (ref <30)

## 2017-03-27 LAB — HEPATIC FUNCTION PANEL
ALK PHOS: 56 U/L (ref 33–130)
ALT: 10 U/L (ref 6–29)
AST: 14 U/L (ref 10–35)
Albumin: 3.8 g/dL (ref 3.6–5.1)
BILIRUBIN DIRECT: 0 mg/dL (ref <=0.2)
BILIRUBIN INDIRECT: 0.3 mg/dL (ref 0.2–1.2)
Total Bilirubin: 0.3 mg/dL (ref 0.2–1.2)
Total Protein: 7 g/dL (ref 6.1–8.1)

## 2017-03-27 LAB — BASIC METABOLIC PANEL WITH GFR
BUN: 13 mg/dL (ref 7–25)
CALCIUM: 10.1 mg/dL (ref 8.6–10.4)
CO2: 26 mmol/L (ref 20–31)
Chloride: 103 mmol/L (ref 98–110)
Creat: 1.05 mg/dL — ABNORMAL HIGH (ref 0.60–0.93)
GFR, EST AFRICAN AMERICAN: 60 mL/min (ref >=60)
GFR, EST NON AFRICAN AMERICAN: 52 mL/min — AB (ref >=60)
Glucose, Bld: 97 mg/dL (ref 65–99)
POTASSIUM: 3.6 mmol/L (ref 3.5–5.3)
SODIUM: 138 mmol/L (ref 135–146)

## 2017-03-27 LAB — TSH: TSH: 1.19 mIU/L

## 2017-03-27 LAB — MAGNESIUM: Magnesium: 2.4 mg/dL (ref 1.5–2.5)

## 2017-03-27 MED ORDER — CLONIDINE HCL 0.2 MG PO TABS: ORAL_TABLET | ORAL | 1 refills | Status: DC

## 2017-03-27 MED ORDER — TRIAMTERENE-HCTZ 37.5-25 MG PO TABS: 1.0000 | ORAL_TABLET | Freq: Every day | ORAL | 1 refills | Status: DC

## 2017-03-27 NOTE — Progress Notes (Signed)
This very nice 76 y.o. WBF presents for 3 month follow up with Hypertension, Hyperlipidemia, Pre-Diabetes and Vitamin D Deficiency. Also she has GERD controlled on current treatment. Patient is also followed by Dr Ashok Cordia for Mild COPD & a LLL 6 mm nodule.     Patient is treated for HTN since age 52 in 21 & BP has been controlled at home. Today's BP is at goal - 108/72. Patient has had no complaints of any cardiac type chest pain, palpitations, dyspnea/orthopnea/PND, dizziness, claudication, or dependent edema.     Hyperlipidemia is controlled with diet & meds. Patient denies myalgias or other med SE's. Last Lipids were not at goal: Lab Results  Component Value Date   CHOL 211 (H) 09/17/2016   HDL 76 09/17/2016   LDLCALC 116 (H) 09/17/2016   TRIG 94 09/17/2016   CHOLHDL 2.8 09/17/2016      Also, the patient has history of PreDiabetes (A1c 6.0% in 2014) and has had no symptoms of reactive hypoglycemia, diabetic polys, paresthesias or visual blurring.  Last A1c was at goal:  Lab Results  Component Value Date   HGBA1C 5.6 09/17/2016      Further, the patient also has history of Vitamin D Deficiency ("18" in 2008)  and supplements vitamin D without any suspected side-effects. Last vitamin D was at goal:  Lab Results  Component Value Date   VD25OH 61 09/17/2016   Current Outpatient Prescriptions on File Prior to Visit  Medication Sig  . acetaminophen (TYLENOL) 500 MG tablet Take 500 mg by mouth every 6 (six) hours as needed (pain).  Marland Kitchen amLODipine (NORVASC) 10 MG tablet TAKE 1 TABLET BY MOUTH EVERY DAY FOR BLOOD PRESSURE  . aspirin EC 81 MG tablet Take 81 mg by mouth 2 (two) times a week.    . Calcium Citrate (CAL-CITRATE PO) Take by mouth.  . cholecalciferol (VITAMIN D) 1000 units tablet Take 4,000 Units by mouth daily after lunch.  . Flaxseed, Linseed, (FLAX SEED OIL) 1000 MG CAPS Take 1,000 mg by mouth daily after lunch.   . halobetasol (ULTRAVATE) 0.05 % cream Apply topically 2  (two) times daily.  . Magnesium 500 MG TABS Take 500 mg by mouth.  . Naphazoline HCl (CLEAR EYES OP) Place 1 drop into both eyes daily.  . Psyllium (METAMUCIL PO) Take 5 mLs by mouth daily after lunch. Mix in 8 oz liquid and drink  . ranitidine (ZANTAC) 300 MG tablet Take 1 tablet (300 mg total) by mouth at bedtime. PRN  . traMADol (ULTRAM) 50 MG tablet Take 1 tablet (50 mg total) by mouth every 12 (twelve) hours as needed for moderate pain. 1 pill twice daily as needed for pain   No current facility-administered medications on file prior to visit.    Allergies  Allergen Reactions  . Lemon Oil Hives    Reaction to lemons  . Shellfish Allergy Swelling   PMHx:   Past Medical History:  Diagnosis Date  . Allergic rhinitis   . Arthritis   . Bronchiectasis ([place])   . Colon polyps   . Diverticulosis   . Emphysema of lung ([place])   . GERD (gastroesophageal reflux disease)   . HSV-1 (herpes simplex virus 1) infection   . Hypertension    Immunization History  Administered Date(s) Administered  . DTaP 07/30/2011  . Influenza, High Dose Seasonal PF 06/07/2016  . Pneumococcal Conjugate-13 11/22/2015  . Pneumococcal Polysaccharide-23 05/11/2013  . Pneumococcal-Unspecified 01/07/2002  . Td 01/07/2002, 05/07/2012  Past Surgical History:  Procedure Laterality Date  . ABDOMINAL HYSTERECTOMY    . COLONOSCOPY    . DIRECT LARYNGOSCOPY N/A 10/21/2014   Procedure: DIRECT LARYNGOSCOPY;  Surgeon: Rozetta Nunnery, MD;  Location: [facility];  Service: ENT;  Laterality: N/A;  . EXCISION NASAL MASS Right 10/21/2014   Procedure: EXCISION NASOPHARYNGEAL MASS;  Surgeon: Rozetta Nunnery, MD;  Location: [facility];  Service: ENT;  Laterality: Right;  . EYE SURGERY     cataracts  . MASS EXCISION Right 10/21/2014   Procedure: RIGHT NECK NODE BIOPSY;  Surgeon: Rozetta Nunnery, MD;  Location: [facility];  Service: ENT;  Laterality: Right;   FHx:     Reviewed / unchanged  SHx:    Reviewed / unchanged  Systems Review:  Constitutional: Denies fever, chills, wt changes, headaches, insomnia, fatigue, night sweats, change in appetite. Eyes: Denies redness, blurred vision, diplopia, discharge, itchy, watery eyes.  ENT: Denies discharge, congestion, post nasal drip, epistaxis, sore throat, earache, hearing loss, dental pain, tinnitus, vertigo, sinus pain, snoring.  CV: Denies chest pain, palpitations, irregular heartbeat, syncope, dyspnea, diaphoresis, orthopnea, PND, claudication or edema. Respiratory: denies cough, dyspnea, DOE, pleurisy, hoarseness, laryngitis, wheezing.  Gastrointestinal: Denies dysphagia, odynophagia, heartburn, reflux, water brash, abdominal pain or cramps, nausea, vomiting, bloating, diarrhea, constipation, hematemesis, melena, hematochezia  or hemorrhoids. Genitourinary: Denies dysuria, frequency, urgency, nocturia, hesitancy, discharge, hematuria or flank pain. Musculoskeletal: Denies arthralgias, myalgias, stiffness, jt. swelling, pain, limping or strain/sprain.  Skin: Denies pruritus, rash, hives, warts, acne, eczema or change in skin lesion(s). Neuro: No weakness, tremor, incoordination, spasms, paresthesia or pain. Psychiatric: Denies confusion, memory loss or sensory loss. Endo: Denies change in weight, skin or hair change.  Heme/Lymph: No excessive bleeding, bruising or enlarged lymph nodes.  Physical Exam  BP 108/72   Pulse (!) 58   Temp (!) 97.2 F (36.2 C)   Resp 16   Ht 5\' 6"  (1.676 m)   Wt 146 lb 3.2 oz (66.3 kg)   BMI 23.60 kg/m   Appears well nourished, well groomed  and in no distress.  Eyes: PERRLA, EOMs, conjunctiva no swelling or erythema. Sinuses: No frontal/maxillary tenderness ENT/Mouth: EAC's clear, TM's nl w/o erythema, bulging. Nares clear w/o erythema, swelling, exudates. Oropharynx clear without erythema or exudates. Oral hygiene is good. Tongue normal, non obstructing. Hearing  intact.  Neck: Supple. Thyroid nl. Car 2+/2+ without bruits, nodes or JVD. Chest: Respirations nl with BS clear & equal w/o rales, rhonchi, wheezing or stridor.  Cor: Heart sounds normal w/ regular rate and rhythm without sig. murmurs, gallops, clicks or rubs. Peripheral pulses normal and equal  without edema.  Abdomen: Soft & bowel sounds normal. Non-tender w/o guarding, rebound, hernias, masses or organomegaly.  Lymphatics: Unremarkable.  Musculoskeletal: Full ROM all peripheral extremities, joint stability, 5/5 strength and normal gait.  Skin: Warm, dry without exposed rashes, lesions or ecchymosis apparent.  Neuro: Cranial nerves intact, reflexes equal bilaterally. Sensory-motor testing grossly intact. Tendon reflexes grossly intact.  Pysch: Alert & oriented x 3.  Insight and judgement nl & appropriate. No ideations.  Assessment and Plan:  1. Essential hypertension  - Continue medication, monitor blood pressure at home.  - Continue DASH diet. Reminder to go to the ER if any CP,  SOB, nausea, dizziness, severe HA, changes vision/speech.  - CBC with Differential/Platelet - BASIC METABOLIC PANEL WITH GFR - Magnesium - TSH  2. Hyperlipidemia, mixed  - Continue diet/meds, exercise,& lifestyle modifications.  -  Continue monitor periodic cholesterol/liver & renal functions   - Hepatic function panel - Lipid panel - TSH  3. Prediabetes  - Continue diet, exercise, lifestyle modifications.  - Monitor appropriate labs.  - Hemoglobin A1c - Insulin, random  4. Vitamin D deficiency  - Continue supplementation.  - VITAMIN D 25 Hydroxy   5. Abnormal glucose  - Hemoglobin A1c - Insulin, random  6. Gastroesophageal reflux disease   7. Medication management   8. Nasopharyngeal carcinoma ([place])  - CBC with Differential/Platelet - BASIC METABOLIC PANEL WITH GFR - Hepatic function panel - Magnesium - Lipid panel - TSH - Hemoglobin A1c - Insulin, random - VITAMIN D 25  Hydroxy        Discussed  regular exercise, BP monitoring, weight control to achieve/maintain BMI less than 25 and discussed med and SE's. Recommended labs to assess and monitor clinical status with further disposition pending results of labs. Over 30 minutes of exam, counseling, chart review was performed.

## 2017-03-27 NOTE — Patient Instructions (Signed)

## 2017-03-28 LAB — VITAMIN D 25 HYDROXY (VIT D DEFICIENCY, FRACTURES): Vit D, 25-Hydroxy: 69 ng/mL (ref 30–100)

## 2017-03-28 LAB — INSULIN, RANDOM: Insulin: 6.5 u[IU]/mL (ref 2.0–19.6)

## 2017-03-28 LAB — HEMOGLOBIN A1C
Hgb A1c MFr Bld: 5.9 % — ABNORMAL HIGH (ref <5.7)
Mean Plasma Glucose: 123 mg/dL

## 2017-03-29 ENCOUNTER — Encounter: Payer: Self-pay | Admitting: Internal Medicine

## 2017-04-17 ENCOUNTER — Encounter: Payer: Self-pay | Admitting: Internal Medicine

## 2017-05-03 ENCOUNTER — Other Ambulatory Visit: Payer: Self-pay | Admitting: Internal Medicine

## 2017-06-13 ENCOUNTER — Ambulatory Visit (INDEPENDENT_AMBULATORY_CARE_PROVIDER_SITE_OTHER): Payer: Medicare Other | Admitting: Pulmonary Disease

## 2017-06-13 DIAGNOSIS — J449 Chronic obstructive pulmonary disease, unspecified: Secondary | ICD-10-CM

## 2017-06-13 LAB — PULMONARY FUNCTION TEST
DL/VA % pred: 51 %
DL/VA: 2.61 ml/min/mmHg/L
DLCO COR % PRED: 46 %
DLCO UNC: 12.85 ml/min/mmHg
DLCO cor: 12.56 ml/min/mmHg
DLCO unc % pred: 47 %
FEF 25-75 PRE: 1 L/s
FEF2575-%PRED-PRE: 63 %
FEV1-%PRED-PRE: 103 %
FEV1-PRE: 1.91 L
FEV1FVC-%Pred-Pre: 87 %
FEV6-%PRED-PRE: 123 %
FEV6-PRE: 2.83 L
FEV6FVC-%Pred-Pre: 101 %
FVC-%PRED-PRE: 121 %
FVC-PRE: 2.89 L
PRE FEV1/FVC RATIO: 66 %
Pre FEV6/FVC Ratio: 98 %

## 2017-06-13 NOTE — Progress Notes (Signed)
PFT done today. 

## 2017-06-25 ENCOUNTER — Ambulatory Visit (INDEPENDENT_AMBULATORY_CARE_PROVIDER_SITE_OTHER): Payer: Medicare Other | Admitting: *Deleted

## 2017-06-25 ENCOUNTER — Encounter: Payer: Self-pay | Admitting: Pulmonary Disease

## 2017-06-25 ENCOUNTER — Ambulatory Visit (INDEPENDENT_AMBULATORY_CARE_PROVIDER_SITE_OTHER): Payer: Medicare Other | Admitting: Pulmonary Disease

## 2017-06-25 VITALS — BP 118/80 | HR 71 | Ht <= 58 in | Wt 143.6 lb

## 2017-06-25 DIAGNOSIS — J449 Chronic obstructive pulmonary disease, unspecified: Secondary | ICD-10-CM | POA: Diagnosis not present

## 2017-06-25 DIAGNOSIS — J849 Interstitial pulmonary disease, unspecified: Secondary | ICD-10-CM

## 2017-06-25 DIAGNOSIS — Z23 Encounter for immunization: Secondary | ICD-10-CM | POA: Diagnosis not present

## 2017-06-25 DIAGNOSIS — K219 Gastro-esophageal reflux disease without esophagitis: Secondary | ICD-10-CM

## 2017-06-25 DIAGNOSIS — R911 Solitary pulmonary nodule: Secondary | ICD-10-CM

## 2017-06-25 DIAGNOSIS — IMO0001 Reserved for inherently not codable concepts without codable children: Secondary | ICD-10-CM

## 2017-06-25 MED ORDER — RANITIDINE HCL 300 MG PO TABS: 300.0000 mg | ORAL_TABLET | Freq: Every day | ORAL | 6 refills | Status: DC

## 2017-06-25 NOTE — Progress Notes (Signed)
SIX MIN WALK 12/27/2016 09/27/2016  Medications Triamterene/HCTZ, clonodine taken at 7 am NORVASC 10MG , CALCIUM CITRATE, VITAMIN D 1000 units,FLEX SEED 1000MG , MAXZIDE-25 all taken at 7:15am   Supplimental Oxygen during Test? (L/min) No No  Laps 8 7  Partial Lap (in Meters) 24 41  Baseline BP (sitting) 118/70 130/72  Baseline Heartrate 66 72  Baseline Dyspnea (Borg Scale) 0 5  Baseline Fatigue (Borg Scale) 0 0  Baseline SPO2 100 100  BP (sitting) 130/80 140/80  Heartrate 79 73  Dyspnea (Borg Scale) 1 8  Fatigue (Borg Scale) 1 2  SPO2 98 90  BP (sitting) 122/76 136/80  Heartrate 64 68  SPO2 100 99  Stopped or Paused before Six Minutes No No  Interpretation - Hip pain  Distance Completed 408 377  Tech Comments: pt completed the test without difficulty pt completed test with no pauses or stops. pt did c/o hip pain but states she doesn't normally exert herself this much.

## 2017-06-25 NOTE — Patient Instructions (Addendum)
   Call me if you have any new breathing problems or questions before your next appointment.  We are sending in refills for your Zantac.

## 2017-06-25 NOTE — Addendum Note (Signed)
Addended by: Tyson Dense on: 06/25/2017 10:30 AM   Modules accepted: Orders

## 2017-06-25 NOTE — Progress Notes (Signed)
Subjective:    Patient ID: Catherine Parks, female    DOB: 07/31/1941, 76 y.o.   MRN: [id number]  C.C.:  Follow-up for Mild COPD w/ Emphysema, ILD, Left Lung Nodule, Chronic Seasonal Allergic Rhinitis, & GERD.   HPI Mild COPD with emphysema: Not currently on inhaler medication. She reports some fluctuance to her dyspnea. She reports no recent coughing or wheezing today. Did have some coughing yesterday that was brief.   ILD: Serum workup suggestive of lupus. Evaluated by rheumatology. Not currently on immunosuppression. Pattern is either fibrotic NSIP or atypical UIP. Previously referred to pulmonary rehabilitation. She is walking for 30 minutes at the [facility] 2-3 times weekly for exercise.   Left lower lobe nodule: Initially seen on high-resolution CT scan November 2017. No signs of progression on previous CT in May. Plan for repeat CT in November. She continues to have some joint pain but no swelling or erythema.   Chronic seasonal allergic rhinitis: Not previously on medication. She reports minimal sinus congestion & drainage.   GERD: Zantac dose increased at last appointment due to uncontrolled reflux. She reports improved reflux & dyspepsia. She does have rare morning brash water taste.   Review of Systems No chest pain or pressure. No fever or chills. Has chronic sweats. No new rashes or bruising.   Allergies  Allergen Reactions  . Lemon Oil Hives    Reaction to lemons  . Shellfish Allergy Swelling    Current Outpatient Prescriptions on File Prior to Visit  Medication Sig Dispense Refill  . acetaminophen (TYLENOL) 500 MG tablet Take 500 mg by mouth every 6 (six) hours as needed (pain).    Marland Kitchen amLODipine (NORVASC) 10 MG tablet TAKE 1 TABLET BY MOUTH EVERY DAY FOR BLOOD PRESSURE 90 tablet 3  . aspirin EC 81 MG tablet Take 81 mg by mouth 2 (two) times a week.      . Calcium Citrate (CAL-CITRATE PO) Take by mouth.    . cholecalciferol (VITAMIN D) 1000 units tablet Take 4,000 Units by  mouth daily after lunch.    . cloNIDine (CATAPRES) 0.2 MG tablet TAKE 1 TABLET BY MOUTH TWICE A DAY FOR BLOOD PRESSURE 180 tablet 1  . Flaxseed, Linseed, (FLAX SEED OIL) 1000 MG CAPS Take 1,000 mg by mouth daily after lunch.     . halobetasol (ULTRAVATE) 0.05 % cream Apply topically 2 (two) times daily. 50 g 0  . Magnesium 500 MG TABS Take 500 mg by mouth.    . Naphazoline HCl (CLEAR EYES OP) Place 1 drop into both eyes daily.    . Psyllium (METAMUCIL PO) Take 5 mLs by mouth daily after lunch. Mix in 8 oz liquid and drink    . ranitidine (ZANTAC) 300 MG tablet Take 1 tablet (300 mg total) by mouth at bedtime. PRN 90 tablet 1  . traMADol (ULTRAM) 50 MG tablet Take 1 tablet (50 mg total) by mouth every 12 (twelve) hours as needed for moderate pain. 1 pill twice daily as needed for pain 60 tablet 0  . triamterene-hydrochlorothiazide (MAXZIDE-25) 37.5-25 MG tablet Take 1 tablet by mouth daily. 90 tablet 1   No current facility-administered medications on file prior to visit.     Past Medical History:  Diagnosis Date  . Allergic rhinitis   . Arthritis   . Bronchiectasis ([place])   . Colon polyps   . Diverticulosis   . Emphysema of lung ([place])   . GERD (gastroesophageal reflux disease)   . HSV-1 (herpes simplex virus  1) infection   . Hypertension     Past Surgical History:  Procedure Laterality Date  . ABDOMINAL HYSTERECTOMY    . COLONOSCOPY    . DIRECT LARYNGOSCOPY N/A 10/21/2014   Procedure: DIRECT LARYNGOSCOPY;  Surgeon: Rozetta Nunnery, MD;  Location: [facility];  Service: ENT;  Laterality: N/A;  . EXCISION NASAL MASS Right 10/21/2014   Procedure: EXCISION NASOPHARYNGEAL MASS;  Surgeon: Rozetta Nunnery, MD;  Location: [facility];  Service: ENT;  Laterality: Right;  . EYE SURGERY     cataracts  . MASS EXCISION Right 10/21/2014   Procedure: RIGHT NECK NODE BIOPSY;  Surgeon: Rozetta Nunnery, MD;  Location: [facility];  Service: ENT;   Laterality: Right;    Family History  Problem Relation Age of Onset  . Heart attack Father   . Hypertension Mother   . Stroke Mother   . Breast cancer Sister   . Cancer Brother   . Prostate cancer Brother   . Breast cancer Maternal Grandmother   . Lung disease Neg Hx   . Rheumatologic disease Neg Hx     Social History   Social History  . Marital status: Widowed    Spouse name: N/A  . Number of children: N/A  . Years of education: N/A   Social History Main Topics  . Smoking status: Former Smoker    Packs/day: 1.00    Years: 58.00    Types: Cigarettes    Start date: 10/26/1956    Quit date: 09/23/2014  . Smokeless tobacco: Never Used     Comment: She only took a break for 2 months  . Alcohol use No  . Drug use: No  . Sexual activity: Not Asked     Comment: has not smoked since 09/27/14-using nicotin gum   Other Topics Concern  . None   Social History Narrative   Originally from [place]. She previously worked as a Pharmacist, hospital. Currently has a dog. Denies any bird exposure. No mold or hot tub exposure.       Objective:   Physical Exam BP 118/80 (BP Location: Right Arm, Patient Position: Sitting, Cuff Size: Normal)   Pulse 71   Ht 1' (0.305 m)   Wt 143 lb 9.6 oz (65.1 kg)   SpO2 100%   BMI 701.12 kg/m   General:  Awake. Alert. Elderly female. No distress. Integument:  No rash. Warm. Dry. Extremities:  No cyanosis or clubbing.  HEENT:  No scleral icterus. No scleral injection Moist mucus membranes. No oral ulcers.  Cardiovascular:  Regular rate. No edema. Regular rhythm.  Pulmonary:  Normal work of breathing on room air. Clear with auscultation. Abdomen: Soft. Nondistended. Nontender. Musculoskeletal:  Normal bulk and tone. Hand grip strength 5/5 bilaterally. No joint deformity or effusion appreciated. Neurological:  Cranial nerves 2-12 grossly in tact. No meningismus. Moving all 4 extremities equally.   PFT 06/13/17: FVC 2.89 L (121%) FEV1 1.91 L (103%) FEV1/FVC 0.66  FEF 25-75 1.00 L (63%)  DLCO corrected 46% 12/27/16: FVC 2.81 L (117%) FEV1 1.85 L (99%) FEV1/FVC 0.66 FEF 25-75 0.93 L (58%)                                                                                                                         DLCO uncorrected 40% 09/27/16: FVC 2.92 L (121%) FEV1 2.01 L (180%) FEV1/FVC 0.69 FEF 25-75 1.20 L (74%) negative bronchodilator response TLC 5.05 L (94%) RV 85% ERV 501% DLCO corrected 50% (Hgb 12.7)  6MWT 06/25/17:  Walked 321 meters / Baseline Sat 100% on RA / Nadir Sat 100% @ end of walk (reports she wasn't walking as fast as she could have) 12/27/16:  Walked 408 meters / Baseline Sat 100% on RA / Nadir Sat 98% @ end of walk 09/27/16:  Walked 377 meters / Baseline Sat 100% on RA / Nadir Sat 90% @ end of walk  IMAGING CT CHEST W/O 01/29/17 (personally reviewed by me):  Predominantly subpleural reticulation consistent with known interstitial lung disease. Subcentimeter pulmonary nodules noted with nodule in left lower lobe continuing to measure approximately 6 mm and without sign of progression. No developing nodule appreciated. No pleural effusion or thickening appreciated. No pericardial effusion. No pathologic mediastinal adenopathy.  CT CHEST W/O 10/22/16 (previously reviewed by me): Tiny bilateral lung nodules. 4 mm left upper lobe nodule & 6 mm left lower lobe nodule stable. Reticulation with some suggestion of honeycombing noted. No pleural effusion or thickening. No pericardial effusion. No pathologic mediastinal adenopathy.  ESOPHAGRAM/BARIUM SWALLOW 08/13/16 (per radiologist): Laryngeal penetration during swallowing due to delayed initiation with no tracheal aspiration. Slight feline esophagus appearance can sometimes correlate with mild esophagitis. No ulceration or erosions identified. No dysmotility. No reflux.  HRCT CHEST W/O  08/07/16 (previously reviewed by me):  Mild to moderate apical predominant centrilobular emphysema. 6 mm left lower lobe nodule adjacent the diaphragm is noted. Slight craniocaudal progression of subpleural reticulation with some traction bronchiectasis within right lower lobe as well as possible early honeycombing within same lobe. No obvious groundglass opacities. No pleural effusion or thickening. No pericardial effusion. No pathologic mediastinal adenopathy.   CT CHEST W/O 06/17/16 (previously reviewed by me): Patchy opacification with some subpleural reticulation suggested in right upper lung zone. Was focal and lower lung suggestion of bronchiectasis. Upper lobe predominant emphysematous changes. No pleural effusion or thickening. No pericardial effusion. No pathologic mediastinal adenopathy.  CARDIAC  TTE (11/01/14): LV normal in size with EF 60-65%. Hypokinesis of basal inferior myocardium with global lateral strain -21.7%. LA & RA normal in size. RV normal in size and function. Artery systolic pressure 40 mmHg. No aortic stenosis or regurgitation. Aortic root normal in size. Mild mitral regurgitation without stenosis. No colonic stenosis with trivial regurgitation. Moderate tricuspid regurgitation. No pericardial effusion.  LABS 08/02/16 Alpha-1 antitrypsin: MM (153) IgG: 1276 IgA: 658 IgM: 76 IgE: 1652 RAST Panel:  D farinae 1.75 / Cockroach 1.66 & other weak positives CRP: 0.1 ESR: 59 ANA:  Positive  DS DNA Ab:  181 Smith Ab:  <0.2 RNP Ab:  <0.2 SSA:  <0.2 SSB:  <0.2 Anti-CCP:  <16    Assessment & Plan:  76 y.o. female with persistent mild airway obstruction on her spirometry consistent with mild COPD and emphysema. Patient spirometry remains stable without evidence of significant worsening. Her short-term walk test distance is likely due to effort rather than respiratory limitation. Her saturation continues to remain stable as well. Overall her reflux is doing better on the  higher dose of Zantac which I encouraged her to continue. Given her continued clinical stability and stability in pulmonary function testing I am continuing to defer immunosuppression. I instructed the patient to contact our office if she had any new breathing problems or questions before next appointment.  1. Mild COPD with emphysema: Continuing to hold on inhaler medications at this time. Monitoring for any symptoms. 2. ILD: Possibly secondary to lupus. No sign of progression. Holding on immunosuppression. 3. Left lung nodule: Continued monitoring with repeat CT chest without contrast in November as planned. 4. Chronic seasonal allergic rhinitis: Minimal symptoms. Holding off on the medications. 5. GERD: Controlled with Zantac 300 mg by mouth daily at bedtime. Prescription refill today. 6. Health maintenance:  Status post Prevnar March 2017, Pneumovax 08 May 2013, & Tdap November 2012. Administering high-dose influenza vaccine today. 7. Follow-up: Return to clinic in 6 months or sooner if needed.  Sonia Baller Ashok Cordia, M.D. [facility] Pager:  [phone] After 3pm or if no response, call [phone] 9:38 AM 06/25/17

## 2017-07-10 ENCOUNTER — Encounter: Payer: Self-pay | Admitting: Physician Assistant

## 2017-07-10 ENCOUNTER — Ambulatory Visit (INDEPENDENT_AMBULATORY_CARE_PROVIDER_SITE_OTHER): Payer: Medicare Other | Admitting: Physician Assistant

## 2017-07-10 VITALS — BP 120/80 | HR 80 | Temp 97.5°F | Resp 16 | Ht 66.0 in | Wt 141.8 lb

## 2017-07-10 DIAGNOSIS — J479 Bronchiectasis, uncomplicated: Secondary | ICD-10-CM

## 2017-07-10 DIAGNOSIS — E782 Mixed hyperlipidemia: Secondary | ICD-10-CM | POA: Diagnosis not present

## 2017-07-10 DIAGNOSIS — Z79899 Other long term (current) drug therapy: Secondary | ICD-10-CM

## 2017-07-10 DIAGNOSIS — J449 Chronic obstructive pulmonary disease, unspecified: Secondary | ICD-10-CM | POA: Diagnosis not present

## 2017-07-10 DIAGNOSIS — R7309 Other abnormal glucose: Secondary | ICD-10-CM | POA: Diagnosis not present

## 2017-07-10 DIAGNOSIS — I700 Atherosclerosis of aorta: Secondary | ICD-10-CM | POA: Diagnosis not present

## 2017-07-10 DIAGNOSIS — R911 Solitary pulmonary nodule: Secondary | ICD-10-CM

## 2017-07-10 DIAGNOSIS — J849 Interstitial pulmonary disease, unspecified: Secondary | ICD-10-CM

## 2017-07-10 DIAGNOSIS — I10 Essential (primary) hypertension: Secondary | ICD-10-CM

## 2017-07-10 HISTORY — DX: Solitary pulmonary nodule: R91.1

## 2017-07-10 MED ORDER — RANITIDINE HCL 300 MG PO TABS: 300.0000 mg | ORAL_TABLET | Freq: Every day | ORAL | 1 refills | Status: DC

## 2017-07-10 NOTE — Patient Instructions (Signed)
You can take tylenol (500mg) or tylenol arthritis (650mg) with the meloxicam/antiinflammatories. The max you can take of tylenol a day is 3000mg daily, this is a max of 6 pills a day of the regular tyelnol (500mg) or a max of 4 a day of the tylenol arthritis (650mg) as long as no other medications you are taking contain tylenol.   Community Programs Location  Contact Information Description of Services Cost  A Matter of Balance Class locations vary. Call [facility] Area Agency on Aging for more information.  www.ptrc.org [phone] 8-Session program addressing the fear of falling and increasing activity levels of older adults Free to minimal cost  [facility] [address], [place], [place] [address].  [email or web address] [phone]  Personal training, gym, classes including Silver Sneakers* and ACTion for Aging Adults Fee-based  A.H.O.Y. (Add Health to Our Years) Airs on [employer] Channel 13, M-F at 8am and 1pm  [place] Locations: [facility],  [address] [facility], [address] [facility] [address] [facility],  [address] [facility], [address] [facility], [address] [facility], [address] [facility], [address] [facility], [address] [facility] [address]  [place] Location: [facility] [address]      [phone]  [phone]  [phone]  [phone]  [phone]  [phone]  [phone]  [phone]  [phone]  [phone]    [phone] A total-body conditioning class for adults 55 and older; designed to increase muscular strength, endurance, range of movement, flexibility, balance, agility and coordination Free  [facility] [address] [place], [place] [address] [phone] Silver Sneakers* Tai Chi Fee-based        [facility]      [address]      [phone]      Pilate's class for individualsreturning to exercise after an injury, before or after surgery or for individuals with complex musculoskeletal issues; designed to improve strength, balance , flexibility      $15/class  [facility] [address] [place], [place] [address] [email or web address] [phone] Tai Chi classes for beginners to advanced Donation-based  [facility] [address] [place], [place] [address] [email or web address] [phone]  Tai Chi Chair Exercises Free, ages 60 and older; Ages 55-59 fee based  [facility] [address] [place],  [address] [email or web address] [phone]  A.H.O.Y. Tai Chi Fee-based Donation based or free  [facility] Tai Chi Class locations vary.  Call or email Eric Reiss or view website for more information. [email or web address] [phone] Ongoing classes at local YMCAs and gyms Fee-based  Silver Sneakers [facility]  [facility] [employer]: Area [facility] [phone]  [phone] Classes designed for older adults who want to improve their strength, flexibility, balance and endurance.   Silver sneakers is covered by some insurance plans and includes a fitness center membership at participating locations. Find out more by calling [phone] or visiting  [email or web address] Covered by some insurance plans  [facility] [address]  [place] [phone] A.H.O.Y., fitness room, personal training, fitness classes for injury prevention, strength, balance, flexibility, water fitness classes Ages 55+: $60 for 6 months; Ages 18-54: $70 for 6 months  T[facility] [address] [place], [place] [address] [email or web address] [phone] Tai Chi classes for beginners to advanced; geared for seniors Donation Based      [employer] [facility] [employer] Participate in Exercise     Paul G. Davis, PhD, RCEP 4643 [email or web address] [facility] [facility]     [phone]     A comprehensive fitness program for adults.  The program [employer]is senior-level undergraduates Kinesiology students with adults who desire to learn how to exercise safely.  Includes a structural exercise class focusing on functional fitnesss     $100/semester in fall and spring; $75 in summer (no trainers)    *Silver Sneakers is covered by some insurance plans and includes a  Fitness center membership at participating locations.  Find out more by calling [phone] or visiting [email or web address]  For additional health and human services resources for senior adults, please contact [facility] at [phone] in [place] and [place] at [phone] in all other areas. 

## 2017-07-10 NOTE — Progress Notes (Signed)
Patient ID: Catherine Parks, female   DOB: Jun 02, 1941, 76 y.o.   MRN: [id number]  Assessment and Plan:  Hypertension:  -Continue medication,  -monitor blood pressure at home.  -Continue DASH diet.   -Reminder to go to the ER if any CP, SOB, nausea, dizziness, severe HA, changes vision/speech, left arm numbness and tingling, and jaw pain.  Cholesterol: -Continue diet and exercise.  -Check cholesterol.   Pre-diabetes: -Continue diet and exercise.  -Check A1C  Vitamin D Def: -check level -continue medications.   COPD, mild ([place]) Continue follow up pulmonary, continue zantac  Bronchiectasis without acute exacerbation ([place]) Continue follow up pulmonary, continue zantac  ILD (interstitial lung disease) ([place]) Continue follow up pulmonary, continue zantac  Aortic atherosclerosis (HCC) Control blood pressure, cholesterol, glucose, increase exercise.   Continue diet and meds as discussed. Further disposition pending results of labs. Future Appointments Date Time Provider [place]  07/17/2017 1:00 PM LBCT-CT 1 LBCT-CT LB-CT [facility]  10/17/2017 10:00 AM Unk Pinto, MD [facility] None    HPI 76 y.o. AA female  presents for 3 month follow up with hypertension, hyperlipidemia, prediabetes and vitamin D.   Her blood pressure has been controlled at home, today their BP is BP: 120/80.   She does workout.  She reports that she does try to walk some days.  She reports that she also tries to go to the [facility] at least once per week.   She denies chest pain, shortness of breath, dizziness.   She is not on cholesterol medication and denies myalgias. Her cholesterol is not at goal. The cholesterol last visit was:   Lab Results  Component Value Date   CHOL 208 (H) 03/27/2017   HDL 76 03/27/2017   LDLCALC 114 (H) 03/27/2017   TRIG 88 03/27/2017   CHOLHDL 2.7 03/27/2017    She has been working on diet and exercise for prediabetes, and denies foot ulcerations, hyperglycemia,  hypoglycemia , increased appetite, nausea, paresthesia of the feet, polydipsia, polyuria, visual disturbances, vomiting and weight loss. Last A1C in the office was:  Lab Results  Component Value Date   HGBA1C 5.9 (H) 03/27/2017   Patient is on Vitamin D supplement.  Lab Results  Component Value Date   VD25OH 69 03/27/2017     BMI is Body mass index is 22.89 kg/m., she is working on diet and exercise. Wt Readings from Last 3 Encounters:  07/10/17 141 lb 12.8 oz (64.3 kg)  06/25/17 143 lb 9.6 oz (65.1 kg)  03/27/17 146 lb 3.2 oz (66.3 kg)     Current Medications:  Current Outpatient Prescriptions on File Prior to Visit  Medication Sig Dispense Refill  . acetaminophen (TYLENOL) 500 MG tablet Take 500 mg by mouth every 6 (six) hours as needed (pain).    Marland Kitchen amLODipine (NORVASC) 10 MG tablet TAKE 1 TABLET BY MOUTH EVERY DAY FOR BLOOD PRESSURE 90 tablet 3  . aspirin EC 81 MG tablet Take 81 mg by mouth 2 (two) times a week.      . Calcium Citrate (CAL-CITRATE PO) Take by mouth.    . cholecalciferol (VITAMIN D) 1000 units tablet Take 4,000 Units by mouth daily after lunch.    . cloNIDine (CATAPRES) 0.2 MG tablet TAKE 1 TABLET BY MOUTH TWICE A DAY FOR BLOOD PRESSURE 180 tablet 1  . Flaxseed, Linseed, (FLAX SEED OIL) 1000 MG CAPS Take 1,000 mg by mouth daily after lunch.     . halobetasol (ULTRAVATE) 0.05 % cream Apply topically 2 (two) times  daily. 50 g 0  . Magnesium 500 MG TABS Take 500 mg by mouth.    . Naphazoline HCl (CLEAR EYES OP) Place 1 drop into both eyes daily.    . Psyllium (METAMUCIL PO) Take 5 mLs by mouth daily after lunch. Mix in 8 oz liquid and drink    . ranitidine (ZANTAC) 300 MG tablet Take 1 tablet (300 mg total) by mouth at bedtime. 30 tablet 6  . traMADol (ULTRAM) 50 MG tablet Take 1 tablet (50 mg total) by mouth every 12 (twelve) hours as needed for moderate pain. 1 pill twice daily as needed for pain 60 tablet 0  . triamterene-hydrochlorothiazide (MAXZIDE-25) 37.5-25  MG tablet Take 1 tablet by mouth daily. 90 tablet 1   No current facility-administered medications on file prior to visit.     Medical History:  Past Medical History:  Diagnosis Date  . Allergic rhinitis   . Arthritis   . Bronchiectasis ([place])   . Colon polyps   . Diverticulosis   . Emphysema of lung ([place])   . GERD (gastroesophageal reflux disease)   . HSV-1 (herpes simplex virus 1) infection   . Hypertension     Allergies:  Allergies  Allergen Reactions  . Lemon Oil Hives    Reaction to lemons  . Shellfish Allergy Swelling     Review of Systems:  Review of Systems  Constitutional: Negative for chills, fever and malaise/fatigue.  HENT: Negative for congestion, ear pain and sore throat.   Respiratory: Negative for cough, shortness of breath and wheezing.   Cardiovascular: Negative for chest pain, palpitations and leg swelling.  Gastrointestinal: Negative for abdominal pain, blood in stool, constipation, diarrhea, heartburn and melena.  Genitourinary: Negative.   Skin: Negative.   Neurological: Negative for dizziness, sensory change, loss of consciousness and headaches.  Psychiatric/Behavioral: Negative for depression. The patient is not nervous/anxious and does not have insomnia.     Family history- Review and unchanged  Social history- Review and unchanged  Physical Exam: BP 120/80   Pulse 80   Temp (!) 97.5 F (36.4 C)   Resp 16   Ht 5\' 6"  (1.676 m)   Wt 141 lb 12.8 oz (64.3 kg)   SpO2 99%   BMI 22.89 kg/m  Wt Readings from Last 3 Encounters:  07/10/17 141 lb 12.8 oz (64.3 kg)  06/25/17 143 lb 9.6 oz (65.1 kg)  03/27/17 146 lb 3.2 oz (66.3 kg)    General Appearance: Well nourished well developed, in no apparent distress. Eyes: PERRLA, EOMs, conjunctiva no swelling or erythema ENT/Mouth: Ear canals normal without obstruction, swelling, erythma, discharge.  TMs normal bilaterally.  Oropharynx moist, clear, without exudate, or postoropharyngeal  swelling. Neck: Supple, thyroid normal,no cervical adenopathy  Respiratory: Respiratory effort normal, Breath sounds clear A&P without rhonchi, wheeze, or rale.  No retractions, no accessory usage. Cardio: RRR with no MRGs. Brisk peripheral pulses without edema.  Abdomen: Soft, + BS,  Non tender, no guarding, rebound, hernias, masses. Musculoskeletal: Full ROM, 5/5 strength, Normal gait Skin: Warm, dry without rashes, lesions, ecchymosis.  Neuro: Awake and oriented X 3, Cranial nerves intact. Normal muscle tone, no cerebellar symptoms. Psych: Normal affect, Insight and Judgment appropriate.    Vicie Mutters, PA-C 11:44 AM [facility]

## 2017-07-11 LAB — CBC WITH DIFFERENTIAL/PLATELET
BASOS ABS: 23 {cells}/uL (ref 0–200)
Basophils Relative: 0.4 %
EOS ABS: 251 {cells}/uL (ref 15–500)
Eosinophils Relative: 4.4 %
HEMATOCRIT: 36 % (ref 35.0–45.0)
Hemoglobin: 12.4 g/dL (ref 11.7–15.5)
LYMPHS ABS: 1756 {cells}/uL (ref 850–3900)
MCH: 29.6 pg (ref 27.0–33.0)
MCHC: 34.4 g/dL (ref 32.0–36.0)
MCV: 85.9 fL (ref 80.0–100.0)
MPV: 8.8 fL (ref 7.5–12.5)
Monocytes Relative: 12.2 %
NEUTROS PCT: 52.2 %
Neutro Abs: 2975 cells/uL (ref 1500–7800)
Platelets: 376 10*3/uL (ref 140–400)
RBC: 4.19 10*6/uL (ref 3.80–5.10)
RDW: 13.7 % (ref 11.0–15.0)
Total Lymphocyte: 30.8 %
WBC: 5.7 10*3/uL (ref 3.8–10.8)
WBCMIX: 695 {cells}/uL (ref 200–950)

## 2017-07-11 LAB — LIPID PANEL
Cholesterol: 217 mg/dL — ABNORMAL HIGH (ref <200)
HDL: 71 mg/dL (ref >50)
LDL Cholesterol (Calc): 126 mg/dL (calc) — ABNORMAL HIGH
NON-HDL CHOLESTEROL (CALC): 146 mg/dL — AB (ref <130)
Total CHOL/HDL Ratio: 3.1 (calc) (ref <5.0)
Triglycerides: 94 mg/dL (ref <150)

## 2017-07-11 LAB — TSH: TSH: 1.23 mIU/L (ref 0.40–4.50)

## 2017-07-11 LAB — HEPATIC FUNCTION PANEL
AG Ratio: 1.3 (calc) (ref 1.0–2.5)
ALBUMIN MSPROF: 4 g/dL (ref 3.6–5.1)
ALT: 9 U/L (ref 6–29)
AST: 13 U/L (ref 10–35)
Alkaline phosphatase (APISO): 55 U/L (ref 33–130)
BILIRUBIN DIRECT: 0.1 mg/dL (ref 0.0–0.2)
BILIRUBIN TOTAL: 0.2 mg/dL (ref 0.2–1.2)
Globulin: 3.2 g/dL (calc) (ref 1.9–3.7)
Indirect Bilirubin: 0.1 mg/dL (calc) — ABNORMAL LOW (ref 0.2–1.2)
Total Protein: 7.2 g/dL (ref 6.1–8.1)

## 2017-07-11 LAB — MAGNESIUM: Magnesium: 2 mg/dL (ref 1.5–2.5)

## 2017-07-11 LAB — HEMOGLOBIN A1C
Hgb A1c MFr Bld: 5.8 % of total Hgb — ABNORMAL HIGH (ref <5.7)
Mean Plasma Glucose: 120 (calc)
eAG (mmol/L): 6.6 (calc)

## 2017-07-11 LAB — BASIC METABOLIC PANEL WITH GFR
BUN/Creatinine Ratio: 19 (calc) (ref 6–22)
BUN: 20 mg/dL (ref 7–25)
CALCIUM: 10.3 mg/dL (ref 8.6–10.4)
CO2: 28 mmol/L (ref 20–32)
Chloride: 102 mmol/L (ref 98–110)
Creat: 1.05 mg/dL — ABNORMAL HIGH (ref 0.60–0.93)
GFR, EST AFRICAN AMERICAN: 60 mL/min/{1.73_m2} (ref >=60)
GFR, EST NON AFRICAN AMERICAN: 52 mL/min/{1.73_m2} — AB (ref >=60)
Glucose, Bld: 95 mg/dL (ref 65–99)
POTASSIUM: 4.2 mmol/L (ref 3.5–5.3)
Sodium: 137 mmol/L (ref 135–146)

## 2017-07-16 NOTE — Progress Notes (Signed)
Pt aware of lab results & voiced understanding of those results.

## 2017-07-16 NOTE — Progress Notes (Signed)
LVM for pt to return office call for LAB results.

## 2017-07-17 ENCOUNTER — Ambulatory Visit (INDEPENDENT_AMBULATORY_CARE_PROVIDER_SITE_OTHER)
Admission: RE | Admit: 2017-07-17 | Discharge: 2017-07-17 | Disposition: A | Discharge status: Home / Self Care | Payer: Medicare Other | Source: Ambulatory Visit | Attending: Pulmonary Disease | Admitting: Pulmonary Disease

## 2017-07-17 DIAGNOSIS — J849 Interstitial pulmonary disease, unspecified: Secondary | ICD-10-CM

## 2017-09-25 ENCOUNTER — Other Ambulatory Visit: Payer: Self-pay | Admitting: Internal Medicine

## 2017-10-16 NOTE — Progress Notes (Signed)
[place] [facility] Unk Pinto, M.D.     [name], [name] Liane Comber, [place] [address] [place] [address] Telephone [phone] Telefax [phone] Annual Screening/Preventative Visit & Comprehensive Evaluation &  Examination     This very nice 77 y.o. WBF  presents for a Screening/Preventative Visit & comprehensive evaluation and management of multiple medical co-morbidities.  Patient has been followed for HTN, T2_NIDDM  Prediabetes, Hyperlipidemia and Vitamin D Deficiency. Patient has been followed by Dr Ashok Cordia for COPD and a 6 mm LLL nodule by CT scans last in Nov 2019 which has remained stable in size since 1st discovered in May 2018. Patient has hx/o GERD stable on prudent diet & Ranitidine.      HTN predates since 38 (age 65) . Patient's BP has been controlled at home and patient denies any cardiac symptoms as chest pain, palpitations, shortness of breath, dizziness or ankle swelling. Today's BP is at goal - 128/82.      Patient's hyperlipidemia is not  controlled with diet.  Last lipids were not at goal: Lab Results  Component Value Date   CHOL 217 (H) 07/10/2017   HDL 71 07/10/2017   LDLCALC 114 (H) 03/27/2017   TRIG 94 07/10/2017   CHOLHDL 3.1 07/10/2017      Patient has prediabetes (A1c 6.0%/2014) and patient denies reactive hypoglycemic symptoms, visual blurring, diabetic polys, or paresthesias. Last A1c was not at goal: Lab Results  Component Value Date   HGBA1C 5.8 (H) 07/10/2017      Finally, patient has history of Vitamin D Deficiency ("18"/2008) and last Vitamin D was at goal:  Lab Results  Component Value Date   VD25OH 69 03/27/2017   Current Outpatient Medications on File Prior to Visit  Medication Sig  . acetaminophen (TYLENOL) 500 MG tablet Take 500 mg by mouth every 6 (six) hours as needed (pain).  Marland Kitchen amLODipine (NORVASC) 10 MG tablet TAKE 1 TABLET BY MOUTH  EVERY DAY FOR BLOOD PRESSURE  . aspirin EC 81 MG tablet Take 81 mg by mouth 2 (two) times a week.    . Calcium Citrate (CAL-CITRATE PO) Take by mouth.  . cholecalciferol (VITAMIN D) 1000 units tablet Take 4,000 Units by mouth daily after lunch.  . cloNIDine (CATAPRES) 0.2 MG tablet TAKE 1 TABLET BY MOUTH TWICE A DAY FOR BLOOD PRESSURE  . Flaxseed, Linseed, (FLAX SEED OIL) 1000 MG CAPS Take 1,000 mg by mouth daily after lunch.   . halobetasol (ULTRAVATE) 0.05 % cream Apply topically 2 (two) times daily.  . Magnesium 500 MG TABS Take 500 mg by mouth.  . Naphazoline HCl (CLEAR EYES OP) Place 1 drop into both eyes daily.  Marland Kitchen OVER THE COUNTER MEDICATION Takes OTC eczema cream PRN.  . ranitidine (ZANTAC) 300 MG tablet Take 1 tablet (300 mg total) by mouth at bedtime.  . traMADol (ULTRAM) 50 MG tablet Take 1 tablet (50 mg total) by mouth every 12 (twelve) hours as needed for moderate pain. 1 pill twice daily as needed for pain  . triamterene-hydrochlorothiazide (MAXZIDE-25) 37.5-25 MG tablet TAKE 1 TABLET BY MOUTH EVERY DAY  . Wheat Dextrin (BENEFIBER PO) Take 1 Dose by mouth daily.   No current facility-administered medications on file prior to visit.    Allergies  Allergen Reactions  . Lemon Oil Hives    Reaction to lemons  . Shellfish Allergy Swelling   Past Medical History:  Diagnosis Date  . Allergic rhinitis   .  Arthritis   . Bronchiectasis ([place])   . Colon polyps   . Diverticulosis   . Emphysema of lung ([place])   . GERD (gastroesophageal reflux disease)   . HSV-1 (herpes simplex virus 1) infection   . Hypertension    Health Maintenance  Topic Date Due  . MAMMOGRAM  04/04/2018  . TETANUS/TDAP  05/07/2022  . INFLUENZA VACCINE  Completed  . DEXA SCAN  Completed  . PNA vac Low Risk Adult  Completed   Immunization History  Administered Date(s) Administered  . DTaP 07/30/2011  . Influenza, High Dose Seasonal PF 06/07/2016, 06/25/2017  . Pneumococcal Conjugate-13 11/22/2015  .  Pneumococcal Polysaccharide-23 05/11/2013  . Pneumococcal-Unspecified 01/07/2002  . Td 01/07/2002, 05/07/2012   Last Colon - Colon 04/04/2017 Dr Sharlett Iles - recc 10 yr f/u in Feb 2022. Last MGM - 04/04/2017  Past Surgical History:  Procedure Laterality Date  . ABDOMINAL HYSTERECTOMY    . COLONOSCOPY    . DIRECT LARYNGOSCOPY N/A 10/21/2014   Procedure: DIRECT LARYNGOSCOPY;  Surgeon: Rozetta Nunnery, MD;  Location: [place];  Service: ENT;  Laterality: N/A;  . EXCISION NASAL MASS Right 10/21/2014   Procedure: EXCISION NASOPHARYNGEAL MASS;  Surgeon: Rozetta Nunnery, MD;  Location: [facility];  Service: ENT;  Laterality: Right;  . EYE SURGERY     cataracts  . MASS EXCISION Right 10/21/2014   Procedure: RIGHT NECK NODE BIOPSY;  Surgeon: Rozetta Nunnery, MD;  Location: [facility];  Service: ENT;  Laterality: Right;   Family History  Problem Relation Age of Onset  . Heart attack Father   . Hypertension Mother   . Stroke Mother   . Breast cancer Sister   . Cancer Brother   . Prostate cancer Brother   . Breast cancer Maternal Grandmother   . Lung disease Neg Hx   . Rheumatologic disease Neg Hx    Social History   Tobacco Use  . Smoking status: Former Smoker    Packs/day: 1.00    Years: 58.00    Pack years: 58.00    Types: Cigarettes    Start date: 10/26/1956    Last attempt to quit: 09/23/2014    Years since quitting: 3.0  . Smokeless tobacco: Never Used  . Tobacco comment: She only took a break for 2 months  Substance Use Topics  . Alcohol use: No    Alcohol/week: 0.0 oz  . Drug use: No    ROS Constitutional: Denies fever, chills, weight loss/gain, headaches, insomnia,  night sweats, and change in appetite. Does c/o fatigue. Eyes: Denies redness, blurred vision, diplopia, discharge, itchy, watery eyes.  ENT: Denies discharge, congestion, post nasal drip, epistaxis, sore throat, earache, hearing loss, dental pain,  Tinnitus, Vertigo, Sinus pain, snoring.  Cardio: Denies chest pain, palpitations, irregular heartbeat, syncope, dyspnea, diaphoresis, orthopnea, PND, claudication, edema Respiratory: denies cough, dyspnea, DOE, pleurisy, hoarseness, laryngitis, wheezing.  Gastrointestinal: Denies dysphagia, heartburn, reflux, water brash, pain, cramps, nausea, vomiting, bloating, diarrhea, constipation, hematemesis, melena, hematochezia, jaundice, hemorrhoids Genitourinary: Denies dysuria, frequency, urgency, nocturia, hesitancy, discharge, hematuria, flank pain Breast: Breast lumps, nipple discharge, bleeding.  Musculoskeletal: Denies arthralgia, myalgia, stiffness, Jt. Swelling, pain, limp, and strain/sprain. Denies falls. Skin: Denies puritis, rash, hives, warts, acne, eczema, changing in skin lesion Neuro: No weakness, tremor, incoordination, spasms, paresthesia, pain Psychiatric: Denies confusion, memory loss, sensory loss. Denies Depression. Endocrine: Denies change in weight, skin, hair change, nocturia, and paresthesia, diabetic polys, visual blurring, hyper / hypo glycemic episodes.  Heme/Lymph: No excessive bleeding, bruising, enlarged lymph nodes.  Physical Exam  BP 128/82   Pulse 64   Temp (!) 97.5 F (36.4 C)   Resp 16   Ht 5\' 6"  (1.676 m)   Wt 145 lb 9.6 oz (66 kg)   BMI 23.50 kg/m   General Appearance: Well nourished, well groomed and in no apparent distress.  Eyes: PERRLA, EOMs, conjunctiva no swelling or erythema, normal fundi and vessels. Sinuses: No frontal/maxillary tenderness ENT/Mouth: EACs patent / TMs  nl. Nares clear without erythema, swelling, mucoid exudates. Oral hygiene is good. No erythema, swelling, or exudate. Tongue normal, non-obstructing. Tonsils not swollen or erythematous. Hearing normal.  Neck: Supple, thyroid normal. No bruits, nodes or JVD. Respiratory: Respiratory effort normal.  BS equal and clear bilateral without rales, rhonci, wheezing or stridor. Cardio:  Heart sounds are normal with regular rate and rhythm and no murmurs, rubs or gallops. Peripheral pulses are normal and equal bilaterally without edema. No aortic or femoral bruits. Chest: symmetric with normal excursions and percussion. Breasts: Symmetric, without lumps, nipple discharge, retractions, or fibrocystic changes.  Abdomen: Flat, soft with bowel sounds active. Nontender, no guarding, rebound, hernias, masses, or organomegaly.  Lymphatics: Non tender without lymphadenopathy.  Genitourinary:  Musculoskeletal: Full ROM all peripheral extremities, joint stability, 5/5 strength, and normal gait. Skin: Warm and dry without rashes, lesions, cyanosis, clubbing or  ecchymosis.  Neuro: Cranial nerves intact, reflexes equal bilaterally. Normal muscle tone, no cerebellar symptoms. Sensation intact.  Pysch: Alert and oriented X 3, normal affect, Insight and Judgment appropriate.   Assessment and Plan  1. Annual Preventative Screening Examination  2. Essential hypertension  - EKG 12-Lead - Urinalysis, Routine w reflex microscopic - Microalbumin / creatinine urine ratio - CBC with Differential/Platelet - BASIC METABOLIC PANEL WITH GFR - Magnesium - TSH  3. Hyperlipidemia, mixed  - EKG 12-Lead - Hepatic function panel - Lipid panel - TSH  4. Prediabetes  - EKG 12-Lead - Hemoglobin A1c - Insulin, random  5. Vitamin D deficiency  - VITAMIN D 25 Hydroxy  6. Abnormal glucose  - Hemoglobin A1c - Insulin, random  7. COPD, mild ([place])   8. Gastroesophageal reflux disease  - CBC with Differential/Platelet  9. Screening for colorectal cancer  - POC Hemoccult Bld/Stl   10. Screening for ischemic heart disease  - EKG 12-Lead  11. Aortic atherosclerosis (HCC)  - EKG 12-Lead  12. Former smoker  - EKG 12-Lead  13. Medication management  - Urinalysis, Routine w reflex microscopic - Microalbumin / creatinine urine ratio - CBC with Differential/Platelet - BASIC  METABOLIC PANEL WITH GFR - Hepatic function panel - Magnesium - Lipid panel - TSH - Hemoglobin A1c - Insulin, random - VITAMIN D 25 Hydroxy        Patient was counseled in prudent diet to achieve/maintain BMI less than 25 for weight control, BP monitoring, regular exercise and medications. Discussed med's effects and SE's. Screening labs and tests as requested with regular follow-up as recommended. Over 40 minutes of exam, counseling, chart review and high complex critical decision making was performed.

## 2017-10-16 NOTE — Patient Instructions (Signed)

## 2017-10-17 ENCOUNTER — Ambulatory Visit (INDEPENDENT_AMBULATORY_CARE_PROVIDER_SITE_OTHER): Payer: Medicare Other | Admitting: Internal Medicine

## 2017-10-17 VITALS — BP 128/82 | HR 64 | Temp 97.5°F | Resp 16 | Ht 66.0 in | Wt 145.6 lb

## 2017-10-17 DIAGNOSIS — Z136 Encounter for screening for cardiovascular disorders: Secondary | ICD-10-CM

## 2017-10-17 DIAGNOSIS — Z0000 Encounter for general adult medical examination without abnormal findings: Secondary | ICD-10-CM | POA: Diagnosis not present

## 2017-10-17 DIAGNOSIS — Z1212 Encounter for screening for malignant neoplasm of rectum: Secondary | ICD-10-CM

## 2017-10-17 DIAGNOSIS — E782 Mixed hyperlipidemia: Secondary | ICD-10-CM

## 2017-10-17 DIAGNOSIS — I700 Atherosclerosis of aorta: Secondary | ICD-10-CM

## 2017-10-17 DIAGNOSIS — I10 Essential (primary) hypertension: Secondary | ICD-10-CM | POA: Diagnosis not present

## 2017-10-17 DIAGNOSIS — E559 Vitamin D deficiency, unspecified: Secondary | ICD-10-CM

## 2017-10-17 DIAGNOSIS — Z79899 Other long term (current) drug therapy: Secondary | ICD-10-CM

## 2017-10-17 DIAGNOSIS — R7303 Prediabetes: Secondary | ICD-10-CM

## 2017-10-17 DIAGNOSIS — Z87891 Personal history of nicotine dependence: Secondary | ICD-10-CM

## 2017-10-17 DIAGNOSIS — Z1211 Encounter for screening for malignant neoplasm of colon: Secondary | ICD-10-CM

## 2017-10-17 DIAGNOSIS — K219 Gastro-esophageal reflux disease without esophagitis: Secondary | ICD-10-CM

## 2017-10-17 DIAGNOSIS — Z0001 Encounter for general adult medical examination with abnormal findings: Secondary | ICD-10-CM

## 2017-10-17 DIAGNOSIS — J449 Chronic obstructive pulmonary disease, unspecified: Secondary | ICD-10-CM

## 2017-10-17 DIAGNOSIS — R7309 Other abnormal glucose: Secondary | ICD-10-CM

## 2017-10-18 ENCOUNTER — Encounter: Payer: Self-pay | Admitting: Internal Medicine

## 2017-10-20 LAB — BASIC METABOLIC PANEL WITH GFR
BUN/Creatinine Ratio: 16 (calc) (ref 6–22)
BUN: 15 mg/dL (ref 7–25)
CO2: 27 mmol/L (ref 20–32)
CREATININE: 0.96 mg/dL — AB (ref 0.60–0.93)
Calcium: 10.6 mg/dL — ABNORMAL HIGH (ref 8.6–10.4)
Chloride: 104 mmol/L (ref 98–110)
GFR, EST AFRICAN AMERICAN: 67 mL/min/{1.73_m2} (ref >=60)
GFR, EST NON AFRICAN AMERICAN: 57 mL/min/{1.73_m2} — AB (ref >=60)
Glucose, Bld: 94 mg/dL (ref 65–99)
POTASSIUM: 4 mmol/L (ref 3.5–5.3)
SODIUM: 140 mmol/L (ref 135–146)

## 2017-10-20 LAB — MICROALBUMIN / CREATININE URINE RATIO
Creatinine, Urine: 177 mg/dL (ref 20–275)
MICROALB/CREAT RATIO: 14 ug/mg{creat} (ref <30)
Microalb, Ur: 2.5 mg/dL

## 2017-10-20 LAB — URINALYSIS, ROUTINE W REFLEX MICROSCOPIC
BILIRUBIN URINE: NEGATIVE
GLUCOSE, UA: NEGATIVE
Hgb urine dipstick: NEGATIVE
KETONES UR: NEGATIVE
Leukocytes, UA: NEGATIVE
Nitrite: NEGATIVE
PROTEIN: NEGATIVE
Specific Gravity, Urine: 1.021 (ref 1.001–1.03)
pH: 5.5 (ref 5.0–8.0)

## 2017-10-20 LAB — HEPATIC FUNCTION PANEL
AG RATIO: 1.4 (calc) (ref 1.0–2.5)
ALT: 8 U/L (ref 6–29)
AST: 11 U/L (ref 10–35)
Albumin: 4.2 g/dL (ref 3.6–5.1)
Alkaline phosphatase (APISO): 62 U/L (ref 33–130)
BILIRUBIN DIRECT: 0.1 mg/dL (ref 0.0–0.2)
BILIRUBIN TOTAL: 0.4 mg/dL (ref 0.2–1.2)
Globulin: 3.1 g/dL (calc) (ref 1.9–3.7)
Indirect Bilirubin: 0.3 mg/dL (calc) (ref 0.2–1.2)
Total Protein: 7.3 g/dL (ref 6.1–8.1)

## 2017-10-20 LAB — CBC WITH DIFFERENTIAL/PLATELET
BASOS ABS: 20 {cells}/uL (ref 0–200)
Basophils Relative: 0.5 %
EOS PCT: 5.4 %
Eosinophils Absolute: 211 cells/uL (ref 15–500)
HEMATOCRIT: 38.6 % (ref 35.0–45.0)
Hemoglobin: 13.1 g/dL (ref 11.7–15.5)
LYMPHS ABS: 1771 {cells}/uL (ref 850–3900)
MCH: 29.2 pg (ref 27.0–33.0)
MCHC: 33.9 g/dL (ref 32.0–36.0)
MCV: 86 fL (ref 80.0–100.0)
MPV: 9.1 fL (ref 7.5–12.5)
Monocytes Relative: 12.2 %
Neutro Abs: 1424 cells/uL — ABNORMAL LOW (ref 1500–7800)
Neutrophils Relative %: 36.5 %
PLATELETS: 359 10*3/uL (ref 140–400)
RBC: 4.49 10*6/uL (ref 3.80–5.10)
RDW: 13.9 % (ref 11.0–15.0)
TOTAL LYMPHOCYTE: 45.4 %
WBC mixed population: 476 cells/uL (ref 200–950)
WBC: 3.9 10*3/uL (ref 3.8–10.8)

## 2017-10-20 LAB — INSULIN, RANDOM: Insulin: 9.1 u[IU]/mL (ref 2.0–19.6)

## 2017-10-20 LAB — LIPID PANEL
CHOL/HDL RATIO: 3.2 (calc) (ref <5.0)
Cholesterol: 245 mg/dL — ABNORMAL HIGH (ref <200)
HDL: 76 mg/dL (ref >50)
LDL Cholesterol (Calc): 147 mg/dL (calc) — ABNORMAL HIGH
NON-HDL CHOLESTEROL (CALC): 169 mg/dL — AB (ref <130)
TRIGLYCERIDES: 102 mg/dL (ref <150)

## 2017-10-20 LAB — HEMOGLOBIN A1C
HEMOGLOBIN A1C: 5.8 %{Hb} — AB (ref <5.7)
MEAN PLASMA GLUCOSE: 120 (calc)
eAG (mmol/L): 6.6 (calc)

## 2017-10-20 LAB — VITAMIN D 25 HYDROXY (VIT D DEFICIENCY, FRACTURES): VIT D 25 HYDROXY: 57 ng/mL (ref 30–100)

## 2017-10-20 LAB — TSH: TSH: 1.31 m[IU]/L (ref 0.40–4.50)

## 2017-10-20 LAB — MAGNESIUM: Magnesium: 1.9 mg/dL (ref 1.5–2.5)

## 2017-10-29 ENCOUNTER — Other Ambulatory Visit: Payer: Self-pay

## 2017-10-29 DIAGNOSIS — Z1211 Encounter for screening for malignant neoplasm of colon: Secondary | ICD-10-CM

## 2017-10-29 DIAGNOSIS — Z1212 Encounter for screening for malignant neoplasm of rectum: Principal | ICD-10-CM

## 2017-10-29 LAB — POC HEMOCCULT BLD/STL (HOME/3-CARD/SCREEN)
Card #2 Fecal Occult Blod, POC: NEGATIVE
FECAL OCCULT BLD: NEGATIVE
FECAL OCCULT BLD: NEGATIVE

## 2017-10-30 DIAGNOSIS — Z1212 Encounter for screening for malignant neoplasm of rectum: Secondary | ICD-10-CM | POA: Diagnosis not present

## 2017-11-22 ENCOUNTER — Other Ambulatory Visit: Payer: Self-pay | Admitting: Internal Medicine

## 2017-11-24 ENCOUNTER — Ambulatory Visit: Payer: Medicare Other | Admitting: Adult Health

## 2017-11-24 ENCOUNTER — Encounter: Payer: Self-pay | Admitting: Adult Health

## 2017-11-24 VITALS — BP 126/78 | HR 74 | Temp 98.1°F | Ht 66.0 in | Wt 144.0 lb

## 2017-11-24 DIAGNOSIS — R55 Syncope and collapse: Secondary | ICD-10-CM

## 2017-11-24 DIAGNOSIS — R079 Chest pain, unspecified: Secondary | ICD-10-CM

## 2017-11-24 DIAGNOSIS — R42 Dizziness and giddiness: Secondary | ICD-10-CM

## 2017-11-24 LAB — BASIC METABOLIC PANEL WITH GFR
BUN/Creatinine Ratio: 15 (calc) (ref 6–22)
BUN: 17 mg/dL (ref 7–25)
CALCIUM: 10.6 mg/dL — AB (ref 8.6–10.4)
CHLORIDE: 102 mmol/L (ref 98–110)
CO2: 30 mmol/L (ref 20–32)
CREATININE: 1.11 mg/dL — AB (ref 0.60–0.93)
GFR, Est African American: 55 mL/min/{1.73_m2} — ABNORMAL LOW (ref >=60)
GFR, Est Non African American: 48 mL/min/{1.73_m2} — ABNORMAL LOW (ref >=60)
GLUCOSE: 90 mg/dL (ref 65–99)
Potassium: 4.5 mmol/L (ref 3.5–5.3)
Sodium: 139 mmol/L (ref 135–146)

## 2017-11-24 LAB — CBC WITH DIFFERENTIAL/PLATELET
BASOS PCT: 0.4 %
Basophils Absolute: 21 cells/uL (ref 0–200)
Eosinophils Absolute: 191 cells/uL (ref 15–500)
Eosinophils Relative: 3.6 %
HCT: 37.1 % (ref 35.0–45.0)
HEMOGLOBIN: 12.8 g/dL (ref 11.7–15.5)
Lymphs Abs: 1558 cells/uL (ref 850–3900)
MCH: 29.7 pg (ref 27.0–33.0)
MCHC: 34.5 g/dL (ref 32.0–36.0)
MCV: 86.1 fL (ref 80.0–100.0)
MONOS PCT: 12.7 %
MPV: 9.1 fL (ref 7.5–12.5)
NEUTROS ABS: 2857 {cells}/uL (ref 1500–7800)
Neutrophils Relative %: 53.9 %
PLATELETS: 333 10*3/uL (ref 140–400)
RBC: 4.31 10*6/uL (ref 3.80–5.10)
RDW: 13.9 % (ref 11.0–15.0)
TOTAL LYMPHOCYTE: 29.4 %
WBC: 5.3 10*3/uL (ref 3.8–10.8)
WBCMIX: 673 {cells}/uL (ref 200–950)

## 2017-11-24 LAB — CK TOTAL AND CKMB (NOT AT ARMC): CK TOTAL: 46 U/L (ref 29–143)

## 2017-11-24 LAB — TROPONIN I

## 2017-11-24 NOTE — Progress Notes (Signed)
Assessment and Plan:  Catherine Parks was seen today for near syncope.  Diagnoses and all orders for this visit:  Chest pain, unspecified type/ near syncope/dizziness Has nearly 60 pack year hx of smoking, no recent stress testing - continue ASA, r/o cardiac, discussed could also check carotic [place] if all unremarkable - low risk factors for PE - will proceed with referral to cardiology to discuss stress testing  -     Ambulatory referral to Cardiology -     CBC with Differential/Platelet -     BASIC METABOLIC PANEL WITH GFR -     EKG 12-Lead -     CK total and CKMB (cardiac)not at [facility] - STAT -     Troponin I - STAT  Further disposition pending results of labs. Discussed med's effects and SE's.   Over 30 minutes of exam, counseling, chart review, and critical decision making was performed.   Future Appointments  Date Time Provider [place]  12/23/2017  9:00 AM Parrett, Fonnie Mu, NP [facility] None  01/29/2018  9:00 AM Liane Comber, NP [facility] None  05/08/2018  9:30 AM Unk Pinto, MD [facility] None  11/13/2018 10:00 AM Unk Pinto, MD [facility] None    ------------------------------------------------------------------------------------------------------------------   HPI BP 126/78   Pulse 74   Temp 98.1 F (36.7 C)   Ht 5\' 6"  (1.676 m)   Wt 144 lb (65.3 kg)   SpO2 99%   BMI 23.24 kg/m   77 y.o.AA female with hx of htn, bronchiectasis/emphysema (mild) followed closely by pulmonology, prediabetes presents for an episode dizziness while singing at church. She reports she had some itching that began the night before, took a benadryl that Sunday morning, drank extra water "in case I was dehydrated" - she reports she got up in the choir loft and sang 4 song before starting to "feel funny," hot, nauseous and very dizzy which lasted all day.   She reports similar episode last year with syncope and LOC last year and was evaluated at ER - reports was unremarkable.  Previously established with cardiology but hasn't seen in several years.   She has 58 pack year smoking hx, quit less than 5 years ago.   She recently was seen for CPE in January with EKG 1/31- NSR/unremarkable, however she reports an episode of L sided CP/jaw pain at night in February, went to urgent care the next morning (after forced by family) where EKG was unremarkable, but apparently troponin/CKMB was not checked at that time -   Orthostats today unremarkable -   She denies chest pain, dyspnea, palpitations, dizziness with exertion.   10/2014 ECHO - Left ventricle: The cavity size was normal. Wall thickness was normal. Systolic function was normal. The estimated ejection fraction was in the range of 60% to 65%. There is hypokinesis of the basalinferior myocardium. - Mitral valve: There was mild regurgitation. - Tricuspid valve: There was moderate regurgitation. - Pulmonary arteries: Systolic pressure was mildly increased. PA peak pressure: 40 mm Hg (S).  Past Medical History:  Diagnosis Date  . Allergic rhinitis   . Arthritis   . Bronchiectasis ([place])   . Colon polyps   . Diverticulosis   . Emphysema of lung ([place])   . GERD (gastroesophageal reflux disease)   . HSV-1 (herpes simplex virus 1) infection   . Hypertension      Allergies  Allergen Reactions  . Lemon Oil Hives    Reaction to lemons  . Shellfish Allergy Swelling    Current Outpatient Medications  on File Prior to Visit  Medication Sig  . acetaminophen (TYLENOL) 500 MG tablet Take 500 mg by mouth every 6 (six) hours as needed (pain).  Marland Kitchen amLODipine (NORVASC) 10 MG tablet TAKE 1 TABLET BY MOUTH EVERY DAY FOR BLOOD PRESSURE  . aspirin EC 81 MG tablet Take 81 mg by mouth 2 (two) times a week.    . Calcium Citrate (CAL-CITRATE PO) Take by mouth.  . cholecalciferol (VITAMIN D) 1000 units tablet Take 6,000 Units by mouth daily after lunch.   . cloNIDine (CATAPRES) 0.2 MG tablet TAKE 1 TABLET BY MOUTH TWICE A  DAY FOR BLOOD PRESSURE  . Flaxseed, Linseed, (FLAX SEED OIL) 1000 MG CAPS Take 1,000 mg by mouth daily after lunch.   . halobetasol (ULTRAVATE) 0.05 % cream Apply topically 2 (two) times daily.  . Magnesium 500 MG TABS Take 500 mg by mouth.  . Naphazoline HCl (CLEAR EYES OP) Place 1 drop into both eyes daily.  Marland Kitchen OVER THE COUNTER MEDICATION Takes OTC eczema cream PRN.  . ranitidine (ZANTAC) 300 MG tablet Take 1 tablet (300 mg total) by mouth at bedtime.  . traMADol (ULTRAM) 50 MG tablet Take 1 tablet (50 mg total) by mouth every 12 (twelve) hours as needed for moderate pain. 1 pill twice daily as needed for pain  . triamterene-hydrochlorothiazide (MAXZIDE-25) 37.5-25 MG tablet TAKE 1 TABLET BY MOUTH EVERY DAY  . Wheat Dextrin (BENEFIBER PO) Take 1 Dose by mouth daily.   No current facility-administered medications on file prior to visit.     ROS: Review of Systems  Constitutional: Positive for malaise/fatigue. Negative for chills, diaphoresis and fever.  HENT: Negative for congestion, ear discharge, ear pain, hearing loss, sinus pain, sore throat and tinnitus.   Eyes: Negative for blurred vision, pain, discharge and redness.  Respiratory: Negative for cough, hemoptysis, sputum production, shortness of breath, wheezing and stridor.   Cardiovascular: Positive for chest pain (With jaw pain a few weeks ago - ). Negative for palpitations, orthopnea, claudication, leg swelling and PND.  Gastrointestinal: Positive for nausea. Negative for abdominal pain, diarrhea and vomiting.  Genitourinary: Negative.   Musculoskeletal: Negative for falls, joint pain and myalgias.  Skin: Negative for rash.  Neurological: Positive for dizziness. Negative for tremors, sensory change, speech change, focal weakness, loss of consciousness, weakness and headaches.  Endo/Heme/Allergies: Negative for environmental allergies.  Psychiatric/Behavioral: Negative.   All other systems reviewed and are negative.   Physical  Exam:  BP 126/78   Pulse 74   Temp 98.1 F (36.7 C)   Ht 5\' 6"  (1.676 m)   Wt 144 lb (65.3 kg)   SpO2 99%   BMI 23.24 kg/m   General Appearance: Well nourished, in no apparent distress. Eyes: PERRLA, EOMs, conjunctiva no swelling or erythema ENT/Mouth: Ext aud canals clear, TMs without erythema, bulging. No erythema, swelling, or exudate on post pharynx.  Tonsils not swollen or erythematous. Hearing normal.  Neck: Supple, thyroid normal.  Respiratory: Respiratory effort normal, BS equal bilaterally without rales, rhonchi, wheezing or stridor.  Cardio: RRR with no MRGs. Brisk peripheral pulses without symmetrical edema (mild injection and puffiness to pedal area of R foot, per patient was painful yesterday now pain resolved and significantly improved).  Abdomen: Soft, + BS.  Non tender, no guarding, rebound, hernias, masses. Lymphatics: Non tender without lymphadenopathy.  Musculoskeletal: Full ROM, 5/5 strength, normal gait.  Skin: Warm, dry without rashes, lesions, ecchymosis.  Neuro: Cranial nerves intact. Normal muscle tone, no cerebellar symptoms. Sensation intact.  Psych: Awake and oriented X 3, normal affect, Insight and Judgment appropriate.    Catherine Ribas, NP 2:12 PM [facility]

## 2017-11-24 NOTE — Patient Instructions (Signed)
Near-Syncope °Near-syncope is when you suddenly become weak or dizzy, or you feel like you might pass out (faint). During an episode of near-syncope, you may: °· Feel dizzy or light-headed. °· Feel nauseous. °· See all white or all black in your field of vision. °· Have cold, clammy skin. ° °This condition is caused by a sudden decrease in blood flow to the brain. This decrease can result from various causes, but most of those causes are not dangerous. However, near-syncope can be a sign of a serious medical problem, so it is important to seek medical care. °If you fainted, get medical help right away.Call your local emergency services (911 in the [place] Do not drive yourself to the hospital. °Follow these instructions at home: °Pay attention to any changes in your symptoms. Take these actions to help with your condition: °· Have someone stay with you until you feel stable. °· Do not drive, use machinery, or play sports until your health care provider says it is okay. °· Keep all follow-up visits as told by your health care provider. This is important. °· If you start to feel like you might faint, lie down right away and raise (elevate) your feet above the level of your heart. Breathe deeply and steadily. Wait until all of the symptoms have passed. °· Drink enough fluid to keep your urine clear or pale yellow. °· If you are taking blood pressure or heart medicine, get up slowly and take several minutes to sit and then stand. This can reduce dizziness. °· Take over-the-counter and prescription medicines only as told by your health care provider. ° °Get help right away if: °· You have a severe headache. °· You have unusual pain in your chest, abdomen, or back. °· You are bleeding from your mouth or rectum, or you have black or tarry stool. °· You have a very fast or irregular heartbeat (palpitations). °· You faint once or repeatedly. °· You have a seizure. °· You are confused. °· You have trouble walking. °· You have  severe weakness. °· You have vision problems. °These symptoms may represent a serious problem that is an emergency. Do not wait to see if your symptoms will go away. Get medical help right away. Call your local emergency services (911 in the [place] Do not drive yourself to the hospital. °This information is not intended to replace advice given to you by your health care provider. Make sure you discuss any questions you have with your health care provider. °Document Released: 09/02/2005 Document Revised: 02/08/2016 Document Reviewed: 05/17/2015 °Elsevier Interactive Patient Education © 2017 Else[employer] Inc. ° °

## 2017-12-22 ENCOUNTER — Encounter: Payer: Self-pay | Admitting: Cardiology

## 2017-12-22 ENCOUNTER — Ambulatory Visit: Payer: Medicare Other | Admitting: Cardiology

## 2017-12-22 DIAGNOSIS — E785 Hyperlipidemia, unspecified: Secondary | ICD-10-CM | POA: Diagnosis not present

## 2017-12-22 DIAGNOSIS — R9431 Abnormal electrocardiogram [ECG] [EKG]: Secondary | ICD-10-CM | POA: Insufficient documentation

## 2017-12-22 DIAGNOSIS — I10 Essential (primary) hypertension: Secondary | ICD-10-CM | POA: Diagnosis not present

## 2017-12-22 DIAGNOSIS — R079 Chest pain, unspecified: Secondary | ICD-10-CM

## 2017-12-22 DIAGNOSIS — J449 Chronic obstructive pulmonary disease, unspecified: Secondary | ICD-10-CM | POA: Diagnosis not present

## 2017-12-22 DIAGNOSIS — R55 Syncope and collapse: Secondary | ICD-10-CM | POA: Diagnosis not present

## 2017-12-22 DIAGNOSIS — R42 Dizziness and giddiness: Secondary | ICD-10-CM | POA: Diagnosis not present

## 2017-12-22 MED ORDER — NITROGLYCERIN 0.4 MG SL SUBL: 0.4000 mg | SUBLINGUAL_TABLET | SUBLINGUAL | 3 refills | Status: DC | PRN

## 2017-12-22 NOTE — Assessment & Plan Note (Signed)
Q wave -V2

## 2017-12-22 NOTE — Assessment & Plan Note (Signed)
Followed by Dr Creig Hines

## 2017-12-22 NOTE — Progress Notes (Signed)
12/22/2017 Catherine Parks Catherine Parks   1941-07-08  [id number]  Primary Physician Unk Pinto, MD Primary Cardiologist: Dr Debara Pickett  HPI:  Pleasant 77 y/o female referred to [place] for evaluation of chest and jaw pain. The pt has a history of "enlarged heart" and was followed by a cardiologist for some time (she declined to name him). Dr Melford Aase follows her for HTN, pre DM, and dyslipidemia. Dr Creig Hines follows her for COPD.   The pt relates a history of jaw pain followed by chest pain about 3 weeks ago, This was while she was awake in bed. There was no associated nausea or diaphoresis and she thinks her symptoms only lasted a minute ot two. She mentioned this to her son the next day who suggested she go to the hospital but the patient declined. She denies exertional chest discomfort, she has chronic DOE. Then on 11/24/17 while in church she had a near syncopal spell that sounds orthostatic. This has happened to her before- similar episode last March in church. She again declined EMS but agreed to go to an Urgent care which arranged this appointment. Her labs were unremarkable, EKG WNL.    Current Outpatient Medications  Medication Sig Dispense Refill  . acetaminophen (TYLENOL) 500 MG tablet Take 500 mg by mouth every 6 (six) hours as needed (pain).    Marland Kitchen amLODipine (NORVASC) 10 MG tablet TAKE 1 TABLET BY MOUTH EVERY DAY FOR BLOOD PRESSURE 90 tablet 3  . aspirin EC 81 MG tablet Take 81 mg by mouth 2 (two) times a week.      . Calcium Citrate (CAL-CITRATE PO) Take by mouth.    . cholecalciferol (VITAMIN D) 1000 units tablet Take 6,000 Units by mouth daily after lunch.     . cloNIDine (CATAPRES) 0.2 MG tablet TAKE 1 TABLET BY MOUTH TWICE A DAY FOR BLOOD PRESSURE 180 tablet 1  . Flaxseed, Linseed, (FLAX SEED OIL) 1000 MG CAPS Take 1,000 mg by mouth daily after lunch.     . halobetasol (ULTRAVATE) 0.05 % cream Apply topically 2 (two) times daily. 50 g 0  . Magnesium 500 MG TABS Take 500 mg by mouth.    .  Naphazoline HCl (CLEAR EYES OP) Place 1 drop into both eyes daily.    Marland Kitchen OVER THE COUNTER MEDICATION Takes OTC eczema cream PRN.    . ranitidine (ZANTAC) 300 MG tablet Take 1 tablet (300 mg total) by mouth at bedtime. 90 tablet 1  . traMADol (ULTRAM) 50 MG tablet Take 1 tablet (50 mg total) by mouth every 12 (twelve) hours as needed for moderate pain. 1 pill twice daily as needed for pain 60 tablet 0  . triamterene-hydrochlorothiazide (MAXZIDE-25) 37.5-25 MG tablet TAKE 1 TABLET BY MOUTH EVERY DAY 90 tablet 1  . Wheat Dextrin (BENEFIBER PO) Take 1 Dose by mouth daily.     No current facility-administered medications for this visit.     Allergies  Allergen Reactions  . Lemon Oil Hives    Reaction to lemons  . Shellfish Allergy Swelling and Hives    Past Medical History:  Diagnosis Date  . Allergic rhinitis   . Arthritis   . Bronchiectasis ([place])   . Colon polyps   . Diverticulosis   . Emphysema of lung ([place])   . GERD (gastroesophageal reflux disease)   . HSV-1 (herpes simplex virus 1) infection   . Hypertension     Social History   Socioeconomic History  . Marital status: Widowed    Spouse  name: Not on file  . Number of children: Not on file  . Years of education: Not on file  . Highest education level: Not on file  Occupational History  . Not on file  Social Needs  . Financial resource strain: Not on file  . Food insecurity:    Worry: Not on file    Inability: Not on file  . Transportation needs:    Medical: Not on file    Non-medical: Not on file  Tobacco Use  . Smoking status: Former Smoker    Packs/day: 1.00    Years: 58.00    Pack years: 58.00    Types: Cigarettes    Start date: 10/26/1956    Last attempt to quit: 09/23/2014    Years since quitting: 3.2  . Smokeless tobacco: Never Used  . Tobacco comment: She only took a break for 2 months  Substance and Sexual Activity  . Alcohol use: No    Alcohol/week: 0.0 oz  . Drug use: No  . Sexual activity: Not on  file    Comment: has not smoked since 09/27/14-using nicotin gum  Lifestyle  . Physical activity:    Days per week: Not on file    Minutes per session: Not on file  . Stress: Not on file  Relationships  . Social connections:    Talks on phone: Not on file    Gets together: Not on file    Attends religious service: Not on file    Active member of club or organization: Not on file    Attends meetings of clubs or organizations: Not on file    Relationship status: Not on file  . Intimate partner violence:    Fear of current or ex partner: Not on file    Emotionally abused: Not on file    Physically abused: Not on file    Forced sexual activity: Not on file  Other Topics Concern  . Not on file  Social History Narrative   Originally from [place]. She previously worked as a Pharmacist, hospital. Currently has a dog. Denies any bird exposure. No mold or hot tub exposure.      Family History  Problem Relation Age of Onset  . Heart attack Father   . Hypertension Mother   . Stroke Mother   . Breast cancer Sister   . Cancer Brother   . Prostate cancer Brother   . Breast cancer Maternal Grandmother   . Lung disease Neg Hx   . Rheumatologic disease Neg Hx      Review of Systems: General: negative for chills, fever, night sweats or weight changes.  Cardiovascular: negative for chest pain, dyspnea on exertion, edema, orthopnea, palpitations, paroxysmal nocturnal dyspnea or shortness of breath Dermatological: negative for rash Respiratory: negative for cough or wheezing Urologic: negative for hematuria Abdominal: negative for nausea, vomiting, diarrhea, bright red blood per rectum, melena, or hematemesis Neurologic: negative for visual changes, syncope, or dizziness All other systems reviewed and are otherwise negative except as noted above.    Blood pressure 120/80, pulse (!) 54, height 5' 6.5" (1.689 m), weight 147 lb 6.4 oz (66.9 kg), SpO2 98 %.  General appearance: alert, cooperative, appears  stated age and no distress Neck: no carotid bruit and no JVD Lungs: decreased breath sounds but clear Heart: regular rate and rhythm Abdomen: soft, non-tender; bowel sounds normal; no masses,  no organomegaly Extremities: extremities normal, atraumatic, no cyanosis or edema Pulses: 2+ and symmetric Skin: Skin color, texture, turgor normal.  No rashes or lesions Neurologic: Grossly normal  EKG NSR, Q V2.   ASSESSMENT AND PLAN:   Chest pain with moderate risk of acute coronary syndrome Pt awakened with jaw pain and chest pain two weeks ago.  Postural dizziness with near syncope While in church 11/24/17- this prompted her Urgent Care visit- which led to this appointment  Essential hypertension Controlled  COPD, mild ([place]) Followed by Dr Creig Hines  Dyslipidemia LDL 147  Abnormal EKG Q wave -V2   PLAN  Discussed with Dr Debara Pickett. She had an echo in 2016 that showed normal LVF with a WMA- basilar-inferior HK. We'll obtain an echo and GXT/Myoview. F/U with Dr Debara Pickett after the above test. I also suggested she take an ASA 81 mg and gave her an Rx for SL: NTG.   Kerin Ransom PA-C 12/22/2017 10:46 AM

## 2017-12-22 NOTE — Patient Instructions (Signed)
Medication Instructions:  Start Asprin 81 mg daily Nitroglycerin SL 0.4mg  tablet- take as needed for emergency chest pain    Testing/Procedures: Your physician has requested that you have an echocardiogram. Echocardiography is a painless test that uses sound waves to create images of your heart. It provides your doctor with information about the size and shape of your heart and how well your heart's chambers and valves are working. This procedure takes approximately one hour. There are no restrictions for this procedure. [place] [address], [place] physician has requested that you have en exercise stress myoview. For further information please visit [email or web address]. Please follow instruction sheet, as given. [place]: Your physician recommends that you schedule a follow-up appointment in: After testing with Dr. Debara Pickett   Any Other Special Instructions Will Be Listed Below (If Applicable).     If you need a refill on your cardiac medications before your next appointment, please call your pharmacy.

## 2017-12-22 NOTE — Assessment & Plan Note (Signed)
LDL 147

## 2017-12-22 NOTE — Assessment & Plan Note (Signed)
While in church 11/24/17- this prompted her Urgent Care visit- which led to this appointment

## 2017-12-22 NOTE — Assessment & Plan Note (Signed)
Controlled.  

## 2017-12-22 NOTE — Assessment & Plan Note (Signed)
Pt awakened with jaw pain and chest pain two weeks ago.

## 2017-12-23 ENCOUNTER — Ambulatory Visit: Payer: Medicare Other | Admitting: Adult Health

## 2017-12-24 ENCOUNTER — Ambulatory Visit: Payer: Medicare Other | Admitting: Adult Health

## 2017-12-24 ENCOUNTER — Encounter: Payer: Self-pay | Admitting: Adult Health

## 2017-12-24 DIAGNOSIS — J449 Chronic obstructive pulmonary disease, unspecified: Secondary | ICD-10-CM | POA: Diagnosis not present

## 2017-12-24 DIAGNOSIS — J849 Interstitial pulmonary disease, unspecified: Secondary | ICD-10-CM | POA: Diagnosis not present

## 2017-12-24 DIAGNOSIS — R911 Solitary pulmonary nodule: Secondary | ICD-10-CM | POA: Diagnosis not present

## 2017-12-24 NOTE — Progress Notes (Signed)
'@Patient'  ID: Catherine Parks, female    DOB: [birth year], 77 y.o.   MRN: [id number]  No chief complaint on file.   Referring provider: Unk Pinto, MD  HPI: 77 year old female former smoker followed for mild COPD with emphysema , lung nodule and ILD   TEST  High resolution CT chest November 2017 showed ILD changes with  basilar predominant patchy subpleural reticulation, traction bronchiectasis and mild architectural distortion with mild honeycombing at the right base.  CT chest May 2018 showed no change in numerous tiny pulmonary nodules scattered throughout the lung 6 mm dating back to October 2017, stable ILD changes CT chest November 2018 showed stable pulmonary nodules felt to be benign with no specific follow-up noted.  Chronic changes of interstitial fibrosis without change.  PFT 06/13/17: FVC 2.89 L (121%) FEV1 1.91 L (103%) FEV1/FVC 0.66 FEF 25-75 1.00 L (63%)                                                                                                                        DLCO corrected 46% 12/27/16: FVC 2.81 L (117%) FEV1 1.85 L (99%) FEV1/FVC 0.66 FEF 25-75 0.93 L (58%)                                                                                                                         DLCO uncorrected 40% 09/27/16: FVC 2.92 L (121%) FEV1 2.01 L (180%) FEV1/FVC 0.69 FEF 25-75 1.20 L (74%) negative bronchodilator response TLC 5.05 L (94%) RV 85% ERV 501% DLCO corrected 50% (Hgb 12.7)  LABS 08/02/16 Alpha-1 antitrypsin: MM (153) IgG: 1276 IgA: 658 IgM: 76 IgE: 1652 RAST Panel: D farinae 1.75 / Cockroach 1.66 &other weak positives CRP: 0.1 ESR: 59 ANA: Positive DS DNA Ab: 181 Smith Ab: <0.2 RNP Ab: <0.2 SSA: <0.2 SSB: <0.2 Anti-CCP: <16   12/24/2017 Follow up : Mild COPD , ILD , pulmonary nodules  She returns for a six-month follow-up.  Says that overall breathing is doing about the same.  She is not seen any decrease in her activity tolerance or  any increase in her shortness of breath. Goes to walk at [facility] 3 x  Week. Does own housework.  Gets winded with heavy activity .  She has not on any inhalers  She has known ILD changes on CT chest dating back to October 2017.  Serial CT chest has shown stable changes.  PFT in September 2018 was stable. Previous autoimmune workup  was suggestive of lupus , referred to Rheumatology . Not on Immunosuppression .   Patient had pulmonary nodules show up on CT chest serial follow-up has shown no change.  Consistent with a benign etiology  Has had some intermittent chest pains . following up with Cardiology for recent chest pain . Has echo and stress test .   Allergies  Allergen Reactions  . Lemon Oil Hives    Reaction to lemons  . Shellfish Allergy Swelling and Hives    Immunization History  Administered Date(s) Administered  . DTaP 07/30/2011  . Influenza, High Dose Seasonal PF 06/07/2016, 06/25/2017  . Pneumococcal Conjugate-13 11/22/2015  . Pneumococcal Polysaccharide-23 05/11/2013  . Pneumococcal-Unspecified 01/07/2002  . Td 01/07/2002, 05/07/2012    Past Medical History:  Diagnosis Date  . Allergic rhinitis   . Arthritis   . Bronchiectasis ([place])   . Colon polyps   . Diverticulosis   . Emphysema of lung ([place])   . GERD (gastroesophageal reflux disease)   . HSV-1 (herpes simplex virus 1) infection   . Hypertension     Tobacco History: Social History   Tobacco Use  Smoking Status Former Smoker  . Packs/day: 1.00  . Years: 58.00  . Pack years: 58.00  . Types: Cigarettes  . Start date: 10/26/1956  . Last attempt to quit: 09/23/2014  . Years since quitting: 3.2  Smokeless Tobacco Never Used  Tobacco Comment   She only took a break for 2 months   Counseling given: Not Answered Comment: She only took a break for 2 months   Outpatient Encounter Medications as of 12/24/2017  Medication Sig  . acetaminophen (TYLENOL) 500 MG tablet Take 500 mg by mouth every 6 (six) hours  as needed (pain).  Marland Kitchen amLODipine (NORVASC) 10 MG tablet TAKE 1 TABLET BY MOUTH EVERY DAY FOR BLOOD PRESSURE  . aspirin EC 81 MG tablet Take 81 mg by mouth daily.    . Calcium Citrate (CAL-CITRATE PO) Take by mouth.  . cholecalciferol (VITAMIN D) 1000 units tablet Take 6,000 Units by mouth daily after lunch.   . cloNIDine (CATAPRES) 0.2 MG tablet TAKE 1 TABLET BY MOUTH TWICE A DAY FOR BLOOD PRESSURE  . Flaxseed, Linseed, (FLAX SEED OIL) 1000 MG CAPS Take 1,000 mg by mouth daily after lunch.   . halobetasol (ULTRAVATE) 0.05 % cream Apply topically 2 (two) times daily.  . Magnesium 500 MG TABS Take 500 mg by mouth.  . Naphazoline HCl (CLEAR EYES OP) Place 1 drop into both eyes daily.  . nitroGLYCERIN (NITROSTAT) 0.4 MG SL tablet Place 1 tablet (0.4 mg total) under the tongue every 5 (five) minutes as needed for chest pain.  Marland Kitchen OVER THE COUNTER MEDICATION Takes OTC eczema cream PRN.  . ranitidine (ZANTAC) 300 MG tablet Take 1 tablet (300 mg total) by mouth at bedtime.  . traMADol (ULTRAM) 50 MG tablet Take 1 tablet (50 mg total) by mouth every 12 (twelve) hours as needed for moderate pain. 1 pill twice daily as needed for pain  . triamterene-hydrochlorothiazide (MAXZIDE-25) 37.5-25 MG tablet TAKE 1 TABLET BY MOUTH EVERY DAY  . Wheat Dextrin (BENEFIBER PO) Take 1 Dose by mouth daily.   No facility-administered encounter medications on file as of 12/24/2017.      Review of Systems  Constitutional:   No  weight loss, night sweats,  Fevers, chills, fatigue, or  lassitude.  HEENT:   No headaches,  Difficulty swallowing,  Tooth/dental problems, or  Sore throat,  No sneezing, itching, ear ache, nasal congestion, post nasal drip,   CV:  No chest pain,  Orthopnea, PND, swelling in lower extremities, anasarca, dizziness, palpitations, syncope.   GI  No heartburn, indigestion, abdominal pain, nausea, vomiting, diarrhea, change in bowel habits, loss of appetite, bloody stools.   Resp: No  shortness of breath with exertion or at rest.  No excess mucus, no productive cough,  No non-productive cough,  No coughing up of blood.  No change in color of mucus.  No wheezing.  No chest wall deformity  Skin: no rash or lesions.  GU: no dysuria, change in color of urine, no urgency or frequency.  No flank pain, no hematuria   MS:  No joint pain or swelling.  No decreased range of motion.  No back pain.    Physical Exam  BP 140/78 (BP Location: Left Arm, Cuff Size: Normal)   Pulse (!) 58   Ht 5' 6.5" (1.689 m)   Wt 147 lb (66.7 kg)   SpO2 100%   BMI 23.37 kg/m   GEN: A/Ox3; pleasant , NAD, well nourished    HEENT:  [place]/AT,  EACs-clear, TMs-wnl, NOSE-clear, THROAT-clear, no lesions, no postnasal drip or exudate noted.   NECK:  Supple w/ fair ROM; no JVD; normal carotid impulses w/o bruits; no thyromegaly or nodules palpated; no lymphadenopathy.    RESP  Clear  P & A; w/o, wheezes/ rales/ or rhonchi. no accessory muscle use, no dullness to percussion  CARD:  RRR, no m/r/g, no peripheral edema, pulses intact, no cyanosis or clubbing.  GI:   Soft & nt; nml bowel sounds; no organomegaly or masses detected.   Musco: Warm bil, no deformities or joint swelling noted.   Neuro: alert, no focal deficits noted.    Skin: Warm, no lesions or rashes    Lab Results:  CBC    Component Value Date/Time   WBC 5.3 11/24/2017 1514   RBC 4.31 11/24/2017 1514   HGB 12.8 11/24/2017 1514   HGB 12.6 10/24/2014 0838   HCT 37.1 11/24/2017 1514   HCT 37.5 10/24/2014 0838   PLT 333 11/24/2017 1514   PLT 372 10/24/2014 0838   MCV 86.1 11/24/2017 1514   MCV 91 10/24/2014 0838   MCH 29.7 11/24/2017 1514   MCHC 34.5 11/24/2017 1514   RDW 13.9 11/24/2017 1514   RDW 14.3 10/24/2014 0838   LYMPHSABS 1,558 11/24/2017 1514   LYMPHSABS 2.5 10/24/2014 0838   MONOABS 450 03/27/2017 1132   EOSABS 191 11/24/2017 1514   EOSABS 0.4 10/24/2014 0838   BASOSABS 21 11/24/2017 1514   BASOSABS 0.0  10/24/2014 0838    BMET    Component Value Date/Time   NA 139 11/24/2017 1514   NA 142 10/24/2014 0838   K 4.5 11/24/2017 1514   K 3.5 10/24/2014 0838   CL 102 11/24/2017 1514   CL 102 10/24/2014 0838   CO2 30 11/24/2017 1514   CO2 29 10/24/2014 0838   GLUCOSE 90 11/24/2017 1514   GLUCOSE 100 10/24/2014 0838   BUN 17 11/24/2017 1514   BUN 10 10/24/2014 0838   CREATININE 1.11 (H) 11/24/2017 1514   CALCIUM 10.6 (H) 11/24/2017 1514   CALCIUM 10.2 10/24/2014 0838   GFRNONAA 48 (L) 11/24/2017 1514   GFRAA 55 (L) 11/24/2017 1514    BNP No results found for: BNP  ProBNP No results found for: PROBNP  Imaging: No results found.   Assessment & Plan:   No problem-specific Assessment &  Plan notes found for this encounter.     Rexene Edison, NP 12/24/2017

## 2017-12-24 NOTE — Patient Instructions (Signed)
Activity as tolerated.  Follow up with Dr. Chase Caller in 1 year and As needed

## 2017-12-25 NOTE — Assessment & Plan Note (Signed)
Stable on CT chest , c/w benign etiology .  No further scans indicated.

## 2017-12-25 NOTE — Assessment & Plan Note (Signed)
Appears clinically stable.  Most recent PFT in September 2018 was stable. Will check PFT on return .   Plan  Patient Instructions  Activity as tolerated.  Follow up with Dr. Chase Caller in 1 year and As needed

## 2017-12-25 NOTE — Assessment & Plan Note (Signed)
Appears stable clinically.  Continue to monitor.  Not on maintenance medication.

## 2017-12-26 ENCOUNTER — Ambulatory Visit (HOSPITAL_COMMUNITY): Payer: Medicare Other | Attending: Cardiology

## 2017-12-26 ENCOUNTER — Other Ambulatory Visit: Payer: Self-pay

## 2017-12-26 DIAGNOSIS — I10 Essential (primary) hypertension: Secondary | ICD-10-CM | POA: Insufficient documentation

## 2017-12-26 DIAGNOSIS — I081 Rheumatic disorders of both mitral and tricuspid valves: Secondary | ICD-10-CM | POA: Insufficient documentation

## 2017-12-26 DIAGNOSIS — Z87891 Personal history of nicotine dependence: Secondary | ICD-10-CM | POA: Insufficient documentation

## 2017-12-26 DIAGNOSIS — J449 Chronic obstructive pulmonary disease, unspecified: Secondary | ICD-10-CM | POA: Diagnosis not present

## 2017-12-26 DIAGNOSIS — Z8249 Family history of ischemic heart disease and other diseases of the circulatory system: Secondary | ICD-10-CM | POA: Diagnosis not present

## 2017-12-26 DIAGNOSIS — R9431 Abnormal electrocardiogram [ECG] [EKG]: Secondary | ICD-10-CM

## 2017-12-26 DIAGNOSIS — R55 Syncope and collapse: Secondary | ICD-10-CM | POA: Diagnosis present

## 2017-12-26 DIAGNOSIS — E785 Hyperlipidemia, unspecified: Secondary | ICD-10-CM | POA: Diagnosis not present

## 2017-12-26 DIAGNOSIS — R42 Dizziness and giddiness: Secondary | ICD-10-CM | POA: Diagnosis not present

## 2017-12-26 DIAGNOSIS — R079 Chest pain, unspecified: Secondary | ICD-10-CM | POA: Diagnosis not present

## 2017-12-30 ENCOUNTER — Telehealth (HOSPITAL_COMMUNITY): Payer: Self-pay

## 2017-12-30 NOTE — Telephone Encounter (Signed)
Encounter complete. 

## 2018-01-01 ENCOUNTER — Ambulatory Visit (HOSPITAL_COMMUNITY)
Admission: RE | Admit: 2018-01-01 | Payer: Medicare Other | Source: Ambulatory Visit | Attending: Cardiology | Admitting: Cardiology

## 2018-01-01 ENCOUNTER — Ambulatory Visit (HOSPITAL_COMMUNITY)
Admission: RE | Admit: 2018-01-01 | Discharge: 2018-01-01 | Disposition: A | Discharge status: Home / Self Care | Payer: Medicare Other | Source: Ambulatory Visit | Attending: Cardiovascular Disease | Admitting: Cardiovascular Disease

## 2018-01-01 ENCOUNTER — Encounter (HOSPITAL_COMMUNITY): Payer: Self-pay | Admitting: *Deleted

## 2018-01-01 DIAGNOSIS — I129 Hypertensive chronic kidney disease with stage 1 through stage 4 chronic kidney disease, or unspecified chronic kidney disease: Secondary | ICD-10-CM | POA: Diagnosis not present

## 2018-01-01 DIAGNOSIS — E785 Hyperlipidemia, unspecified: Secondary | ICD-10-CM | POA: Diagnosis not present

## 2018-01-01 DIAGNOSIS — Z8249 Family history of ischemic heart disease and other diseases of the circulatory system: Secondary | ICD-10-CM | POA: Diagnosis not present

## 2018-01-01 DIAGNOSIS — Z87891 Personal history of nicotine dependence: Secondary | ICD-10-CM | POA: Insufficient documentation

## 2018-01-01 DIAGNOSIS — R55 Syncope and collapse: Secondary | ICD-10-CM | POA: Insufficient documentation

## 2018-01-01 DIAGNOSIS — R0609 Other forms of dyspnea: Secondary | ICD-10-CM | POA: Diagnosis not present

## 2018-01-01 DIAGNOSIS — I10 Essential (primary) hypertension: Secondary | ICD-10-CM

## 2018-01-01 DIAGNOSIS — R9431 Abnormal electrocardiogram [ECG] [EKG]: Secondary | ICD-10-CM | POA: Diagnosis not present

## 2018-01-01 DIAGNOSIS — J449 Chronic obstructive pulmonary disease, unspecified: Secondary | ICD-10-CM

## 2018-01-01 DIAGNOSIS — R079 Chest pain, unspecified: Secondary | ICD-10-CM | POA: Diagnosis not present

## 2018-01-01 DIAGNOSIS — R42 Dizziness and giddiness: Secondary | ICD-10-CM | POA: Diagnosis not present

## 2018-01-01 DIAGNOSIS — N182 Chronic kidney disease, stage 2 (mild): Secondary | ICD-10-CM | POA: Insufficient documentation

## 2018-01-01 LAB — MYOCARDIAL PERFUSION IMAGING
Estimated workload: 5.9 METS
Exercise duration (min): 5 min
Exercise duration (sec): 0 s
LV dias vol: 103 mL (ref 46–106)
LV sys vol: 60 mL
MPHR: 143 {beats}/min
Peak HR: 151 {beats}/min
Percent HR: 105 %
RPE: 19
Rest HR: 48 {beats}/min
SDS: 1
SRS: 0
SSS: 1
TID: 1.3

## 2018-01-01 MED ORDER — TECHNETIUM TC 99M TETROFOSMIN IV KIT
10.6000 | PACK | Freq: Once | INTRAVENOUS | Status: AC | PRN
Start: 2018-01-01 — End: 2018-01-01
  Administered 2018-01-01: 10.6 via INTRAVENOUS
  Filled 2018-01-01: qty 11

## 2018-01-01 MED ORDER — TECHNETIUM TC 99M TETROFOSMIN IV KIT
31.2000 | PACK | Freq: Once | INTRAVENOUS | Status: AC | PRN
  Administered 2018-01-01: 31.2 via INTRAVENOUS
  Filled 2018-01-01: qty 32

## 2018-01-01 NOTE — Progress Notes (Signed)
Dr Claiborne Billings reviewed Myoview study. Ok d/c pt home. Pt instructed to take her morning meds when she gets home.

## 2018-01-07 ENCOUNTER — Telehealth: Payer: Self-pay | Admitting: Cardiology

## 2018-01-07 NOTE — Telephone Encounter (Signed)
New Message ° ° ° °Pt is returning call  °

## 2018-01-09 NOTE — Telephone Encounter (Signed)
Patient made aware of results and verbalized her understanding.  Notes recorded by Erlene Quan, PA-C on 01/02/2018 at 1:05 PM EDT Please let the patient know I reviewed her stress test. There was nothing critical seen on her stress, especially since her echo was normal. She should keep her follow up with Dr Debara Pickett in May.

## 2018-01-09 NOTE — Telephone Encounter (Signed)
Follow Up:    Returning Tee's call from 01-07-18,concerning her Stress Test results.

## 2018-01-28 ENCOUNTER — Ambulatory Visit: Payer: Medicare Other | Admitting: Internal Medicine

## 2018-01-28 ENCOUNTER — Encounter: Payer: Self-pay | Admitting: Internal Medicine

## 2018-01-28 VITALS — BP 130/84 | HR 70 | Ht 66.0 in | Wt 145.0 lb

## 2018-01-28 DIAGNOSIS — I10 Essential (primary) hypertension: Secondary | ICD-10-CM | POA: Diagnosis not present

## 2018-01-28 DIAGNOSIS — J449 Chronic obstructive pulmonary disease, unspecified: Secondary | ICD-10-CM | POA: Diagnosis not present

## 2018-01-28 DIAGNOSIS — E785 Hyperlipidemia, unspecified: Secondary | ICD-10-CM

## 2018-01-28 DIAGNOSIS — R079 Chest pain, unspecified: Secondary | ICD-10-CM | POA: Diagnosis not present

## 2018-01-28 NOTE — Progress Notes (Signed)
MEDICARE ANNUAL WELLNESS VISIT AND FOLLOW UP  Assessment:   Diagnoses and all orders for this visit:  Encounter for Medicare annual wellness exam  Essential hypertension Continue medication Monitor blood pressure at home; call if consistently over 130/80 Continue DASH diet.   Reminder to go to the ER if any CP, SOB, nausea, dizziness, severe HA, changes vision/speech, left arm numbness and tingling and jaw pain.  Aortic atherosclerosis (HCC) Control blood pressure, cholesterol, glucose, increase exercise.   Nasopharyngeal carcinoma ([place]) Removed; resolved.  ILD (interstitial lung disease) ([place]) Followed by Dr. Ashok Cordia  COPD, mild [facility]) Followed by Dr. Ashok Cordia  Chronic seasonal allergic rhinitis Continue allergy pill  Bronchiectasis without acute exacerbation ([place]) Followed by Dr. Ashok Cordia  Gastroesophageal reflux disease, esophagitis presence not specified Well managed on current medications Discussed diet, avoiding triggers and other lifestyle changes  Diverticulosis Increase fiber and fluids, UTD on colonoscopy  Polyp of colon, unspecified part of colon, unspecified type Colonoscopy up to date  Arthritis Tylenol PRN  CKD (chronic kidney disease) stage 3, GFR 30-59 ml/min (HCC) Increase fluids, avoid NSAIDS, monitor sugars, will monitor CMP/GFR  Vitamin D deficiency Continue supplementation Check vitamin D level  Pulmonary nodule Followed by Dr. Ashok Cordia  Prediabetes Discussed disease and risks Discussed diet/exercise, weight management  A1C  Medication management CBC, CMP/GFR  HSV-1 (herpes simplex virus 1) infection Antiviral PRN  Dyslipidemia Start low dose medication if remains elevated Continue low cholesterol diet and exercise.  Check lipid panel.    Over 40 minutes of exam, counseling, chart review and critical decision making was performed Future Appointments  Date Time Provider [place]  05/08/2018  9:30 AM Unk Pinto, MD  [facility] None  11/13/2018 10:00 AM Unk Pinto, MD [facility] None     Plan:   During the course of the visit the patient was educated and counseled about appropriate screening and preventive services including:    Pneumococcal vaccine   Prevnar 13  Influenza vaccine  Td vaccine  Screening electrocardiogram  Bone densitometry screening  Colorectal cancer screening  Diabetes screening  Glaucoma screening  Nutrition counseling   Advanced directives: requested   Subjective:  Catherine Parks is a 77 y.o. female who presents for Medicare Annual Wellness Visit and 3 month follow up. Patient has been followed by Dr Ashok Cordia for COPD and a 6 mm LLL nodule by CT scans last in Nov 2019 which has remained stable in size since 1st discovered in May 2018. Patient has hx/o GERD stable by lifestyle & Ranitidine. She recently experienced and episode of chest/jaw pain then a near syncopal episode and was referred to cardiology for evaluation and underwent ECHO and stress test in April 2019 which were both unremarkable.   BMI is Body mass index is 23.4 kg/m., she has been working on diet and exercise. Wt Readings from Last 3 Encounters:  01/29/18 145 lb (65.8 kg)  01/28/18 145 lb (65.8 kg)  01/01/18 147 lb (66.7 kg)    Her blood pressure has been controlled at home, today their BP is BP: 104/64 She does workout. She denies chest pain, shortness of breath, dizziness.   She is not on cholesterol medication and denies myalgias. Her cholesterol is not at goal. The cholesterol last visit was:   Lab Results  Component Value Date   CHOL 245 (H) 10/17/2017   HDL 76 10/17/2017   LDLCALC 147 (H) 10/17/2017   TRIG 102 10/17/2017   CHOLHDL 3.2 10/17/2017    She has been working on diet  and exercise for prediabetes, and denies foot ulcerations, increased appetite, nausea, paresthesia of the feet, polydipsia, polyuria, visual disturbances, vomiting and weight loss. Last A1C in the office  was:  Lab Results  Component Value Date   HGBA1C 5.8 (H) 10/17/2017   Last GFR: Lab Results  Component Value Date   GFRAA 55 (L) 11/24/2017   Patient is on Vitamin D supplement and approaching goal at recent check:    Lab Results  Component Value Date   VD25OH 57 10/17/2017      Medication Review: Current Outpatient Medications on File Prior to Visit  Medication Sig Dispense Refill  . acetaminophen (TYLENOL) 500 MG tablet Take 500 mg by mouth every 6 (six) hours as needed (pain).    Marland Kitchen amLODipine (NORVASC) 10 MG tablet TAKE 1 TABLET BY MOUTH EVERY DAY FOR BLOOD PRESSURE 90 tablet 3  . aspirin EC 81 MG tablet Take 81 mg by mouth daily.      . Calcium Citrate (CAL-CITRATE PO) Take by mouth.    . cholecalciferol (VITAMIN D) 1000 units tablet Take 6,000 Units by mouth daily after lunch.     . cloNIDine (CATAPRES) 0.2 MG tablet TAKE 1 TABLET BY MOUTH TWICE A DAY FOR BLOOD PRESSURE 180 tablet 1  . Flaxseed, Linseed, (FLAX SEED OIL) 1000 MG CAPS Take 1,000 mg by mouth daily after lunch.     . halobetasol (ULTRAVATE) 0.05 % cream Apply topically 2 (two) times daily. 50 g 0  . Magnesium 500 MG TABS Take 500 mg by mouth.    . Naphazoline HCl (CLEAR EYES OP) Place 1 drop into both eyes daily.    . nitroGLYCERIN (NITROSTAT) 0.4 MG SL tablet Place 1 tablet (0.4 mg total) under the tongue every 5 (five) minutes as needed for chest pain. 25 tablet 3  . OVER THE COUNTER MEDICATION Takes OTC eczema cream PRN.    . ranitidine (ZANTAC) 300 MG tablet Take 1 tablet (300 mg total) by mouth at bedtime. 90 tablet 1  . triamterene-hydrochlorothiazide (MAXZIDE-25) 37.5-25 MG tablet TAKE 1 TABLET BY MOUTH EVERY DAY 90 tablet 1  . Wheat Dextrin (BENEFIBER PO) Take 1 Dose by mouth daily.     No current facility-administered medications on file prior to visit.     Allergies  Allergen Reactions  . Lemon Oil Hives    Reaction to lemons  . Shellfish Allergy Swelling and Hives    Current Problems  (verified) Patient Active Problem List   Diagnosis Date Noted  . Dyslipidemia 01/28/2018  . Chest pain with moderate risk of acute coronary syndrome 01/28/2018  . Abnormal EKG 12/22/2017  . Aortic atherosclerosis ([place]) 07/10/2017  . Pulmonary nodule 07/10/2017  . Nasopharyngeal carcinoma ([place]) 03/27/2017  . ILD (interstitial lung disease) ([place]) 09/27/2016  . Bronchiectasis without acute exacerbation ([place]) 08/02/2016  . Arthritis 08/02/2016  . Chronic seasonal allergic rhinitis 08/02/2016  . COPD, mild ([place]) 06/07/2016  . Encounter for Medicare annual wellness exam 08/06/2015  . Body mass index (BMI) of 22.0-22.9 in adult 08/04/2015  . GERD  01/05/2015  . CKD (chronic kidney disease) stage 3, GFR 30-59 ml/min (HCC) 06/08/2014  . Medication management 06/08/2014  . Prediabetes 08/19/2013  . Vitamin D deficiency 08/19/2013  . Essential hypertension   . HSV-1 (herpes simplex virus 1) infection   . Diverticulosis   . Colon polyps     Screening Tests Immunization History  Administered Date(s) Administered  . DTaP 07/30/2011  . Influenza, High Dose Seasonal PF 06/07/2016, 06/25/2017  .  Pneumococcal Conjugate-13 11/22/2015  . Pneumococcal Polysaccharide-23 05/11/2013  . Pneumococcal-Unspecified 01/07/2002  . Td 01/07/2002, 05/07/2012   Preventative care: Last colonoscopy: 2012 Mammogram: 03/2017 DEXA: 03/2016  Prior vaccinations: TD or Tdap: 2013  Influenza: 2018  Pneumococcal: 2003, 2014 Prevnar13: 2017 Shingles/Zostavax: declines  Names of Other Physician/Practitioners you currently use: 1. [facility] here for primary care 2. [facility], eye doctor, last visit 2018 3. Dr. Desma Mcgregor, dentist, last visit 2019, new partial  Patient Care Team: Unk Pinto, MD as PCP - General (Internal Medicine) Warden Fillers, MD as Consulting Physician (Optometry) Croitoru, Dani Gobble, MD as Consulting Physician (Cardiology) Sable Feil, MD as Consulting Physician (Gastroenterology) Mickle Plumb, MD (Gynecology) Druscilla Brownie, MD as Consulting Physician (Dermatology) Volanda Napoleon, MD as Consulting Physician (Oncology) Rozetta Nunnery, MD as Consulting Physician (Otolaryngology)  SURGICAL HISTORY She  has a past surgical history that includes Eye surgery; Abdominal hysterectomy; Colonoscopy; Direct laryngoscopy (N/A, 10/21/2014); Mass excision (Right, 10/21/2014); and Excision nasal mass (Right, 10/21/2014). FAMILY HISTORY Her family history includes Breast cancer in her maternal grandmother and sister; Cancer in her brother and brother; Heart attack in her father; Hypertension in her mother; Other in her brother; Prostate cancer in her brother; Stroke in her mother. SOCIAL HISTORY She  reports that she quit smoking about 3 years ago. Her smoking use included cigarettes. She started smoking about 61 years ago. She has a 58.00 pack-year smoking history. She has never used smokeless tobacco. She reports that she does not drink alcohol or use drugs.   MEDICARE WELLNESS OBJECTIVES: Physical activity: Current Exercise Habits: Home exercise routine, Type of exercise: walking, Time (Minutes): 30, Frequency (Times/Week): 3, Weekly Exercise (Minutes/Week): 90, Intensity: Mild, Exercise limited by: respiratory conditions(s) Cardiac risk factors: Cardiac Risk Factors include: advanced age (>42men, >1 women);hypertension;dyslipidemia Depression/mood screen:   Depression screen [facility] 2/9 01/29/2018  Decreased Interest 0  Down, Depressed, Hopeless 0  PHQ - 2 Score 0    ADLs:  In your present state of health, do you have any difficulty performing the following activities: 01/29/2018 10/18/2017  Hearing? N N  Vision? N N  Difficulty concentrating or making decisions? N N  Walking or climbing stairs? N N  Dressing or bathing? N N  Doing errands, shopping? N N  Some recent data might be hidden     Cognitive  Testing  Alert? Yes  Normal Appearance?Yes  Oriented to person? Yes  Place? Yes   Time? Yes  Recall of three objects?  Yes  Can perform simple calculations? Yes  Displays appropriate judgment?Yes  Can read the correct time from a watch face?Yes  EOL planning: Does Patient Have a Medical Advance Directive?: No Would patient like information on creating a medical advance directive?: No - Patient declined  Review of Systems  Constitutional: Negative for malaise/fatigue and weight loss.  HENT: Negative for hearing loss and tinnitus.   Eyes: Negative for blurred vision and double vision.  Respiratory: Negative for cough, sputum production, shortness of breath and wheezing.   Cardiovascular: Negative for chest pain, palpitations, orthopnea, claudication, leg swelling and PND.  Gastrointestinal: Negative for abdominal pain, blood in stool, constipation, diarrhea, heartburn, melena, nausea and vomiting.  Genitourinary: Negative.   Musculoskeletal: Negative for falls, joint pain and myalgias.  Skin: Negative for rash.  Neurological: Negative for dizziness, tingling, sensory change, weakness and headaches.  Endo/Heme/Allergies: Negative for polydipsia.  Psychiatric/Behavioral: Negative.  Negative for depression, memory loss, substance abuse and suicidal ideas. The  patient is not nervous/anxious and does not have insomnia.   All other systems reviewed and are negative.    Objective:     Today's Vitals   01/29/18 0900  BP: 104/64  Pulse: (!) 56  Temp: (!) 97.3 F (36.3 C)  SpO2: 99%  Weight: 145 lb (65.8 kg)  Height: 5\' 6"  (1.676 m)   Body mass index is 23.4 kg/m.  General appearance: alert, no distress, WD/WN, female HEENT: normocephalic, sclerae anicteric, TMs pearly, nares patent, no discharge or erythema, pharynx normal Oral cavity: MMM, no lesions Neck: supple, no lymphadenopathy, no thyromegaly, no masses Heart: RRR, normal S1, S2, no murmurs Lungs: CTA bilaterally, no  wheezes, rhonchi, or rales Abdomen: +bs, soft, non tender, non distended, no masses, no hepatomegaly, no splenomegaly Musculoskeletal: nontender, no swelling, no obvious deformity Extremities: no edema, no cyanosis, no clubbing Pulses: 2+ symmetric, upper and lower extremities, normal cap refill Neurological: alert, oriented x 3, CN2-12 intact, strength normal upper extremities and lower extremities, sensation normal throughout, DTRs 2+ throughout, no cerebellar signs, gait normal Psychiatric: normal affect, behavior normal, pleasant   Medicare Attestation I have personally reviewed: The patient's medical and social history Their use of alcohol, tobacco or illicit drugs Their current medications and supplements The patient's functional ability including ADLs,fall risks, home safety risks, cognitive, and hearing and visual impairment Diet and physical activities Evidence for depression or mood disorders  The patient's weight, height, BMI, and visual acuity have been recorded in the chart.  I have made referrals, counseling, and provided education to the patient based on review of the above and I have provided the patient with a written personalized care plan for preventive services.     Izora Ribas, NP   01/29/2018

## 2018-01-28 NOTE — Patient Instructions (Signed)
Your physician recommends that you schedule a follow-up appointment as needed with Dr. Hilty.  

## 2018-01-28 NOTE — Progress Notes (Signed)
OFFICE NOTE  Chief Complaint:  Follow-up stress test and echo  Primary Care Physician: Unk Pinto, MD  HPI:  Catherine Parks is a 77 y.o. female with a past medial history significant for chest and jaw pain. The pt has a history of "enlarged heart" and was followed by a cardiologist for some time (she declined to name him). Dr Melford Aase follows her for HTN, pre DM, and dyslipidemia. Dr Creig Hines follows her for COPD. The pt relates a history of jaw pain followed by chest pain about 3 weeks ago, This was while she was awake in bed. There was no associated nausea or diaphoresis and she thinks her symptoms only lasted a minute ot two. She mentioned this to her son the next day who suggested she go to the hospital but the patient declined. She denies exertional chest discomfort, she has chronic DOE. Then on 11/24/17 while in church she had a near syncopal spell that sounds orthostatic. This has happened to her before- similar episode last March in church. She again declined EMS but agreed to go to an Urgent care which arranged this appointment. Her labs were unremarkable, EKG WNL.   01/28/2018  Catherine Parks returns today for follow-up.  She reports no change in her shortness of breath.  She has a couple episodes of discomfort and in the upper chest to her jaw.  Worse at night when laying down which sounds like reflux.  Stress test showed no ischemia.  Echo showed normal systolic function and mild diastolic dysfunction.  I do not think her symptoms are anginal in nature.  PMHx:  Past Medical History:  Diagnosis Date  . Allergic rhinitis   . Arthritis   . Bronchiectasis ([place])   . Colon polyps   . Diverticulosis   . Emphysema of lung ([place])   . GERD (gastroesophageal reflux disease)   . HSV-1 (herpes simplex virus 1) infection   . Hypertension     Past Surgical History:  Procedure Laterality Date  . ABDOMINAL HYSTERECTOMY    . COLONOSCOPY    . DIRECT LARYNGOSCOPY N/A 10/21/2014   Procedure: DIRECT LARYNGOSCOPY;  Surgeon: Rozetta Nunnery, MD;  Location: [place];  Service: ENT;  Laterality: N/A;  . EXCISION NASAL MASS Right 10/21/2014   Procedure: EXCISION NASOPHARYNGEAL MASS;  Surgeon: Rozetta Nunnery, MD;  Location: [place];  Service: ENT;  Laterality: Right;  . EYE SURGERY     cataracts  . MASS EXCISION Right 10/21/2014   Procedure: RIGHT NECK NODE BIOPSY;  Surgeon: Rozetta Nunnery, MD;  Location: [place];  Service: ENT;  Laterality: Right;    FAMHx:  Family History  Problem Relation Age of Onset  . Heart attack Father   . Hypertension Mother   . Stroke Mother   . Breast cancer Sister   . Cancer Brother   . Prostate cancer Brother   . Breast cancer Maternal Grandmother   . Other Brother        Heart Issues  . Cancer Brother   . Lung disease Neg Hx   . Rheumatologic disease Neg Hx     SOCHx:   reports that she quit smoking about 3 years ago. Her smoking use included cigarettes. She started smoking about 61 years ago. She has a 58.00 pack-year smoking history. She has never used smokeless tobacco. She reports that she does not drink alcohol or use drugs.  ALLERGIES:  Allergies  Allergen Reactions  . Lemon Oil  Hives    Reaction to lemons  . Shellfish Allergy Swelling and Hives    ROS: Pertinent items noted in HPI and remainder of comprehensive ROS otherwise negative.  HOME MEDS: Current Outpatient Medications on File Prior to Visit  Medication Sig Dispense Refill  . acetaminophen (TYLENOL) 500 MG tablet Take 500 mg by mouth every 6 (six) hours as needed (pain).    Marland Kitchen amLODipine (NORVASC) 10 MG tablet TAKE 1 TABLET BY MOUTH EVERY DAY FOR BLOOD PRESSURE 90 tablet 3  . aspirin EC 81 MG tablet Take 81 mg by mouth daily.      . Calcium Citrate (CAL-CITRATE PO) Take by mouth.    . cholecalciferol (VITAMIN D) 1000 units tablet Take 6,000 Units by mouth daily after lunch.     . cloNIDine  (CATAPRES) 0.2 MG tablet TAKE 1 TABLET BY MOUTH TWICE A DAY FOR BLOOD PRESSURE 180 tablet 1  . Flaxseed, Linseed, (FLAX SEED OIL) 1000 MG CAPS Take 1,000 mg by mouth daily after lunch.     . halobetasol (ULTRAVATE) 0.05 % cream Apply topically 2 (two) times daily. 50 g 0  . Magnesium 500 MG TABS Take 500 mg by mouth.    . Naphazoline HCl (CLEAR EYES OP) Place 1 drop into both eyes daily.    . nitroGLYCERIN (NITROSTAT) 0.4 MG SL tablet Place 1 tablet (0.4 mg total) under the tongue every 5 (five) minutes as needed for chest pain. 25 tablet 3  . OVER THE COUNTER MEDICATION Takes OTC eczema cream PRN.    . ranitidine (ZANTAC) 300 MG tablet Take 1 tablet (300 mg total) by mouth at bedtime. 90 tablet 1  . traMADol (ULTRAM) 50 MG tablet Take 1 tablet (50 mg total) by mouth every 12 (twelve) hours as needed for moderate pain. 1 pill twice daily as needed for pain 60 tablet 0  . triamterene-hydrochlorothiazide (MAXZIDE-25) 37.5-25 MG tablet TAKE 1 TABLET BY MOUTH EVERY DAY 90 tablet 1  . Wheat Dextrin (BENEFIBER PO) Take 1 Dose by mouth daily.     No current facility-administered medications on file prior to visit.     LABS/IMAGING: No results found for this or any previous visit (from the past 48 hour(s)). No results found.  LIPID PANEL:    Component Value Date/Time   CHOL 245 (H) 10/17/2017 0956   TRIG 102 10/17/2017 0956   HDL 76 10/17/2017 0956   CHOLHDL 3.2 10/17/2017 0956   VLDL 18 03/27/2017 1132   LDLCALC 147 (H) 10/17/2017 0956     WEIGHTS: Wt Readings from Last 3 Encounters:  01/28/18 145 lb (65.8 kg)  01/01/18 147 lb (66.7 kg)  12/24/17 147 lb (66.7 kg)    VITALS: BP 130/84   Pulse 70   Ht 5\' 6"  (1.676 m)   Wt 145 lb (65.8 kg)   BMI 23.40 kg/m   EXAM: Deferred  EKG: Deferred - personally reviewed  ASSESSMENT: 1. Atypical chest pain-likely GERD  PLAN: 1.   Catherine Parks had atypical chest pain - likely GERD.  Stress test was nonischemic and although LVEF was  decreased, her EF on echo was normal suggesting a gating abnormality on stress testing.  She has mild diastolic dysfunction and mild MR and TR which could be followed up by echo at a later date or if she became more short of breath.  According to recent pulmonary follow-up her shortness of breath was stable.  I suspect her symptoms are related to GERD and may benefit from addition of  PPI if she is breaking through her H2 blocker.  Follow-up as needed.  Pixie Casino, MD, [facility], [place] Director of the [facility]  [facility] Diplomate of the American Board of Clinical Lipidology Attending Cardiologist  Direct Dial: [phone]  Fax: [phone]  Website:  [email or web address] 01/28/2018, 4:55 PM

## 2018-01-29 ENCOUNTER — Encounter: Payer: Self-pay | Admitting: Adult Health

## 2018-01-29 ENCOUNTER — Ambulatory Visit: Payer: Medicare Other | Admitting: Adult Health

## 2018-01-29 VITALS — BP 104/64 | HR 56 | Temp 97.3°F | Ht 66.0 in | Wt 145.0 lb

## 2018-01-29 DIAGNOSIS — K5790 Diverticulosis of intestine, part unspecified, without perforation or abscess without bleeding: Secondary | ICD-10-CM | POA: Diagnosis not present

## 2018-01-29 DIAGNOSIS — I10 Essential (primary) hypertension: Secondary | ICD-10-CM

## 2018-01-29 DIAGNOSIS — R7303 Prediabetes: Secondary | ICD-10-CM

## 2018-01-29 DIAGNOSIS — J479 Bronchiectasis, uncomplicated: Secondary | ICD-10-CM

## 2018-01-29 DIAGNOSIS — J849 Interstitial pulmonary disease, unspecified: Secondary | ICD-10-CM

## 2018-01-29 DIAGNOSIS — J449 Chronic obstructive pulmonary disease, unspecified: Secondary | ICD-10-CM

## 2018-01-29 DIAGNOSIS — B009 Herpesviral infection, unspecified: Secondary | ICD-10-CM

## 2018-01-29 DIAGNOSIS — N183 Chronic kidney disease, stage 3 unspecified: Secondary | ICD-10-CM

## 2018-01-29 DIAGNOSIS — K219 Gastro-esophageal reflux disease without esophagitis: Secondary | ICD-10-CM

## 2018-01-29 DIAGNOSIS — E559 Vitamin D deficiency, unspecified: Secondary | ICD-10-CM

## 2018-01-29 DIAGNOSIS — Z0000 Encounter for general adult medical examination without abnormal findings: Secondary | ICD-10-CM

## 2018-01-29 DIAGNOSIS — I700 Atherosclerosis of aorta: Secondary | ICD-10-CM

## 2018-01-29 DIAGNOSIS — C119 Malignant neoplasm of nasopharynx, unspecified: Secondary | ICD-10-CM

## 2018-01-29 DIAGNOSIS — Z0001 Encounter for general adult medical examination with abnormal findings: Secondary | ICD-10-CM | POA: Diagnosis not present

## 2018-01-29 DIAGNOSIS — K635 Polyp of colon: Secondary | ICD-10-CM | POA: Diagnosis not present

## 2018-01-29 DIAGNOSIS — E785 Hyperlipidemia, unspecified: Secondary | ICD-10-CM

## 2018-01-29 DIAGNOSIS — M1990 Unspecified osteoarthritis, unspecified site: Secondary | ICD-10-CM | POA: Diagnosis not present

## 2018-01-29 DIAGNOSIS — R911 Solitary pulmonary nodule: Secondary | ICD-10-CM

## 2018-01-29 DIAGNOSIS — R6889 Other general symptoms and signs: Secondary | ICD-10-CM

## 2018-01-29 DIAGNOSIS — Z79899 Other long term (current) drug therapy: Secondary | ICD-10-CM

## 2018-01-29 DIAGNOSIS — J302 Other seasonal allergic rhinitis: Secondary | ICD-10-CM

## 2018-01-29 NOTE — Patient Instructions (Signed)
Please monitor your blood pressure, as we get older our body can not respond to a low blood pressure as well as it did when we were younger, for this reason we want a bit higher of a blood pressure as you get older to avoid dizziness and fatigue which can lead to falls. [place] call if your blood pressure is consistently above 150/90.   Please monitor your blood pressure. If it is getting below 130/80 AND you are having fatigue with exertion, dizziness we may need to cut your blood pressure medication in half. Please call the office if this is happening. Hypotension As your heart beats, it forces blood through your body. This force is called blood pressure. If you have hypotension, you have low blood pressure. When your blood pressure is too low, you may not get enough blood to your brain. You may feel weak, feel lightheaded, have a fast heartbeat, or even pass out (faint). HOME CARE  Drink enough fluids to keep your pee (urine) clear or pale yellow.  Take all medicines as told by your doctor.  Get up slowly after sitting or lying down.  Wear support stockings as told by your doctor.  Maintain a healthy diet by including foods such as fruits, vegetables, nuts, whole grains, and lean meats. GET HELP IF:  You are throwing up (vomiting) or have watery poop (diarrhea).  You have a fever for more than 2-3 days.  You feel more thirsty than usual.  You feel weak and tired. GET HELP RIGHT AWAY IF:   You pass out (faint).  You have chest pain or a fast or irregular heartbeat.  You lose feeling in part of your body.  You cannot move your arms or legs.  You have trouble speaking.  You get sweaty or feel lightheaded. MAKE SURE YOU:   Understand these instructions.  Will watch your condition.  Will get help right away if you are not doing well or get worse. Document Released: 11/27/2009 Document Revised: 05/05/2013 Document Reviewed: 03/05/2013 Exit[employer] Patient Information 2015  [employer]. This information is not intended to replace advice given to you by your health care provider. Make sure you discuss any questions you have with your health care provider.  

## 2018-01-30 LAB — CBC WITH DIFFERENTIAL/PLATELET
Basophils Absolute: 21 cells/uL (ref 0–200)
Basophils Relative: 0.5 %
EOS PCT: 5.7 %
Eosinophils Absolute: 239 cells/uL (ref 15–500)
HEMATOCRIT: 36.1 % (ref 35.0–45.0)
HEMOGLOBIN: 12.6 g/dL (ref 11.7–15.5)
LYMPHS ABS: 1655 {cells}/uL (ref 850–3900)
MCH: 29.7 pg (ref 27.0–33.0)
MCHC: 34.9 g/dL (ref 32.0–36.0)
MCV: 85.1 fL (ref 80.0–100.0)
MPV: 9.1 fL (ref 7.5–12.5)
Monocytes Relative: 12.6 %
NEUTROS ABS: 1756 {cells}/uL (ref 1500–7800)
NEUTROS PCT: 41.8 %
PLATELETS: 347 10*3/uL (ref 140–400)
RBC: 4.24 10*6/uL (ref 3.80–5.10)
RDW: 13.7 % (ref 11.0–15.0)
Total Lymphocyte: 39.4 %
WBC mixed population: 529 cells/uL (ref 200–950)
WBC: 4.2 10*3/uL (ref 3.8–10.8)

## 2018-01-30 LAB — COMPLETE METABOLIC PANEL WITH GFR
AG RATIO: 1.3 (calc) (ref 1.0–2.5)
ALBUMIN MSPROF: 4 g/dL (ref 3.6–5.1)
ALKALINE PHOSPHATASE (APISO): 64 U/L (ref 33–130)
ALT: 9 U/L (ref 6–29)
AST: 14 U/L (ref 10–35)
BUN: 14 mg/dL (ref 7–25)
CO2: 29 mmol/L (ref 20–32)
Calcium: 10.3 mg/dL (ref 8.6–10.4)
Chloride: 105 mmol/L (ref 98–110)
Creat: 0.88 mg/dL (ref 0.60–0.93)
GFR, EST AFRICAN AMERICAN: 73 mL/min/{1.73_m2} (ref >=60)
GFR, Est Non African American: 63 mL/min/{1.73_m2} (ref >=60)
Globulin: 3.1 g/dL (calc) (ref 1.9–3.7)
Glucose, Bld: 111 mg/dL — ABNORMAL HIGH (ref 65–99)
POTASSIUM: 4.1 mmol/L (ref 3.5–5.3)
Sodium: 138 mmol/L (ref 135–146)
TOTAL PROTEIN: 7.1 g/dL (ref 6.1–8.1)
Total Bilirubin: 0.3 mg/dL (ref 0.2–1.2)

## 2018-01-30 LAB — LIPID PANEL
CHOLESTEROL: 215 mg/dL — AB (ref <200)
HDL: 69 mg/dL (ref >50)
LDL Cholesterol (Calc): 131 mg/dL (calc) — ABNORMAL HIGH
Non-HDL Cholesterol (Calc): 146 mg/dL (calc) — ABNORMAL HIGH (ref <130)
Total CHOL/HDL Ratio: 3.1 (calc) (ref <5.0)
Triglycerides: 54 mg/dL (ref <150)

## 2018-01-30 LAB — HEMOGLOBIN A1C
EAG (MMOL/L): 6.6 (calc)
HEMOGLOBIN A1C: 5.8 %{Hb} — AB (ref <5.7)
Mean Plasma Glucose: 120 (calc)

## 2018-01-30 LAB — TSH: TSH: 1.48 m[IU]/L (ref 0.40–4.50)

## 2018-03-27 IMAGING — CT CT CHEST W/O CM
2 of 3 series · 15 of 36 positions shown, 18 images · non-contrast
Comparison: 08/07/2016 high-resolution chest CT.

CLINICAL DATA: Reported history of interstitial lung disease, COPD
and pulmonary nodule presenting for follow-up.

EXAM:
CT CHEST WITHOUT CONTRAST
TECHNIQUE: Multidetector CT imaging of the chest was performed following the
standard protocol without IV contrast.

[Series 2: thorax · axial · 0.67mm/px · z∈[-297,-29]mm · 12 of 158 slices shown, 15 images]
[im 12/158  mediastinal]
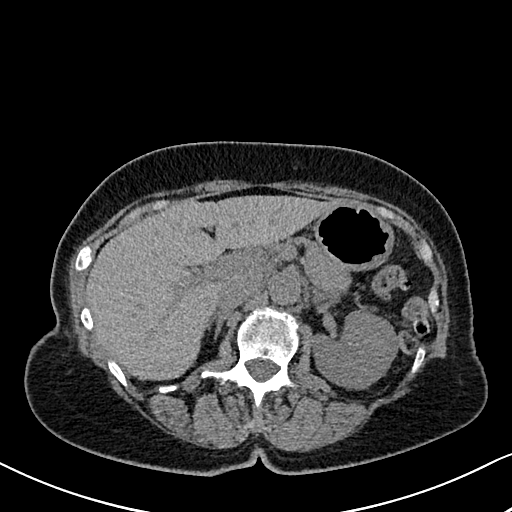
[im 12/158  lung]
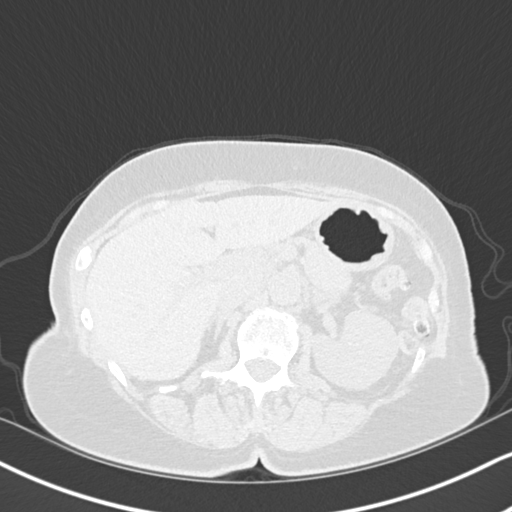
[im 24/158  lung]
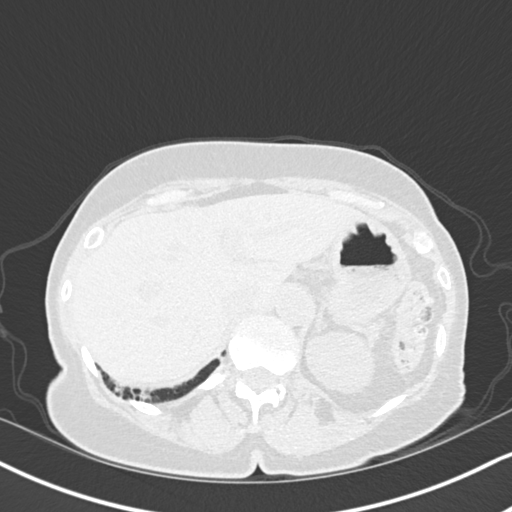
[im 35/158  lung]
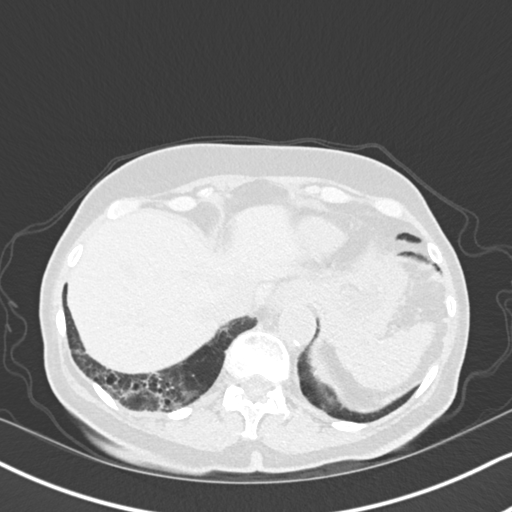
[im 47/158  lung]
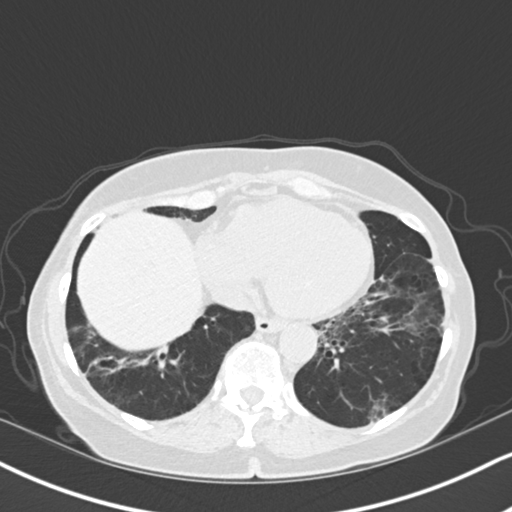
[im 59/158  mediastinal]
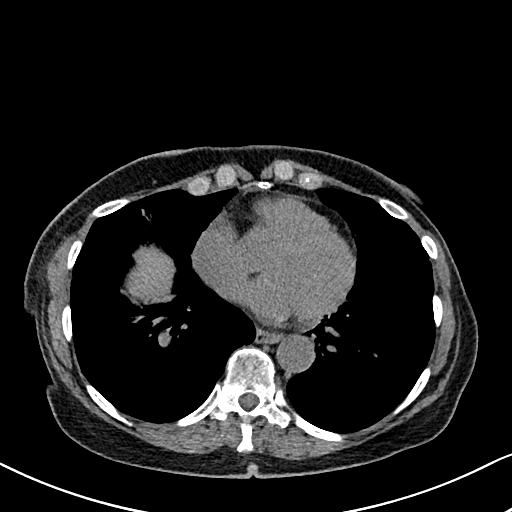
[im 59/158  lung]
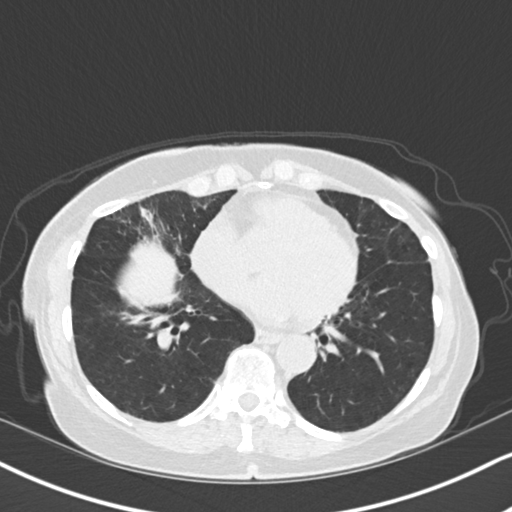
[im 70/158  lung]
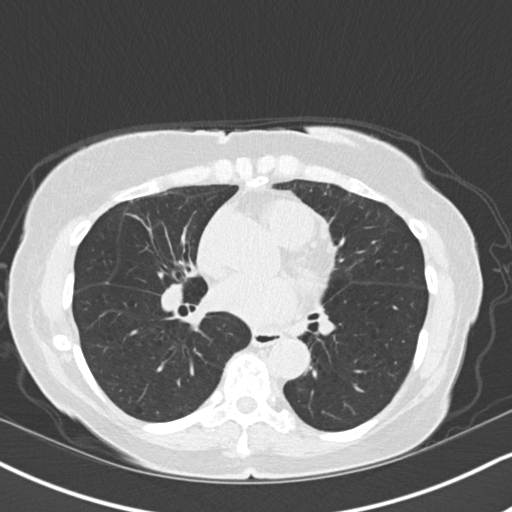
[im 88/158  lung]
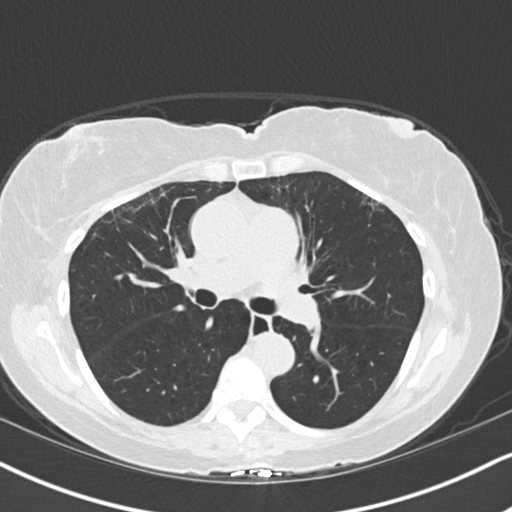
[im 99/158  lung]
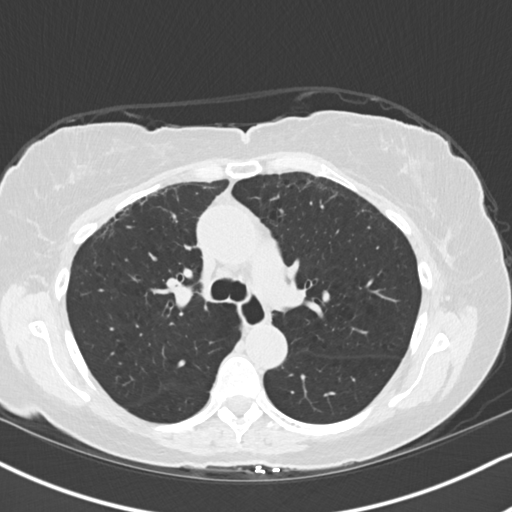
[im 111/158  mediastinal]
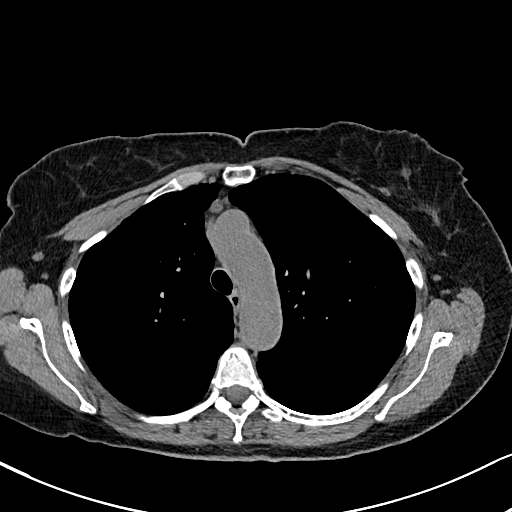
[im 111/158  lung]
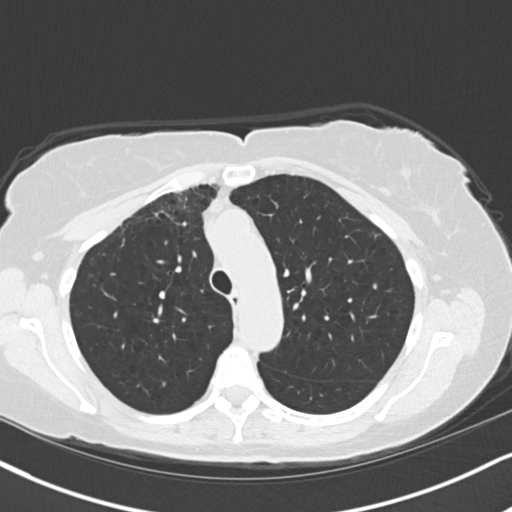
[im 123/158  lung]
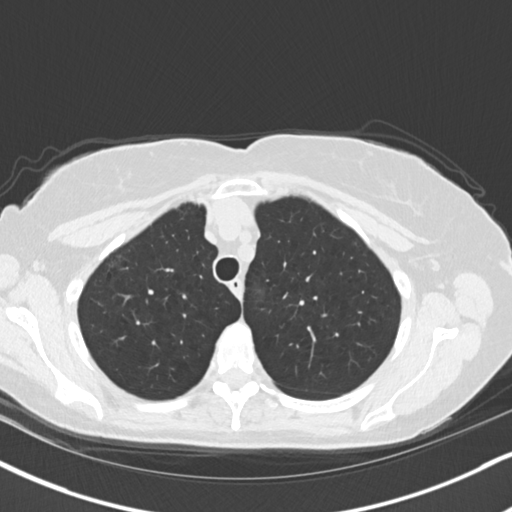
[im 134/158  lung]
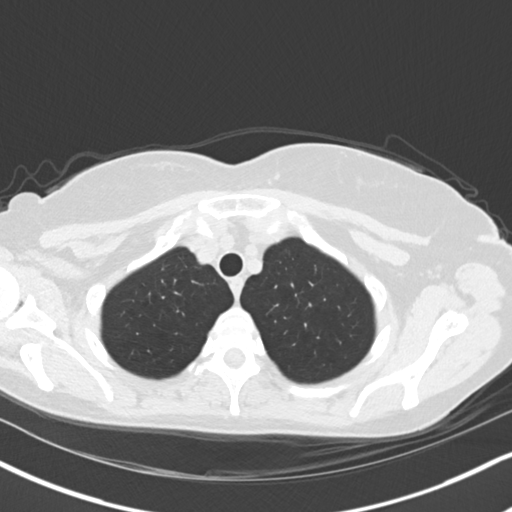
[im 146/158  lung]
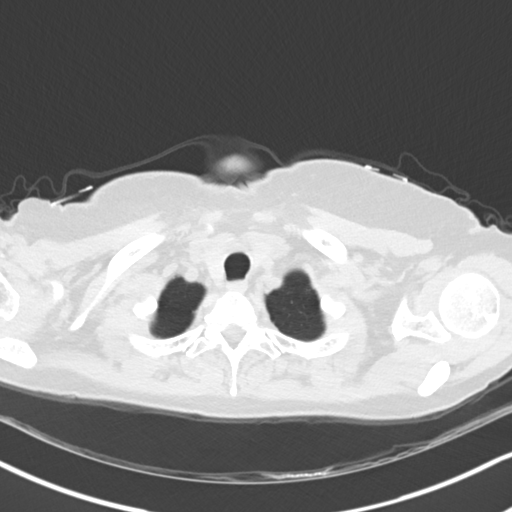

[Series 5: coronal · coronal · 0.57mm/px · 3 of 111 slices shown]
[im 23/111  lung]
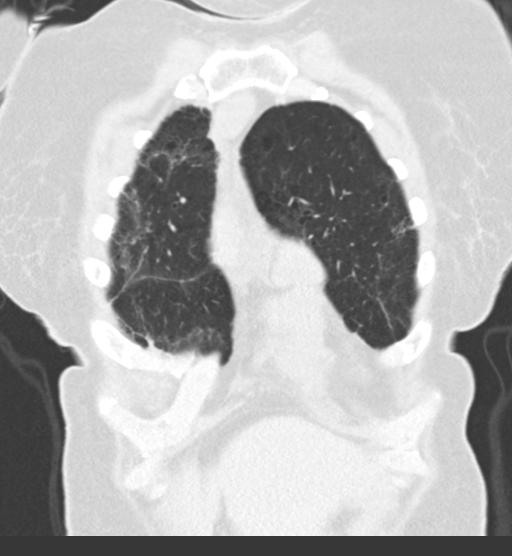
[im 45/111  lung]
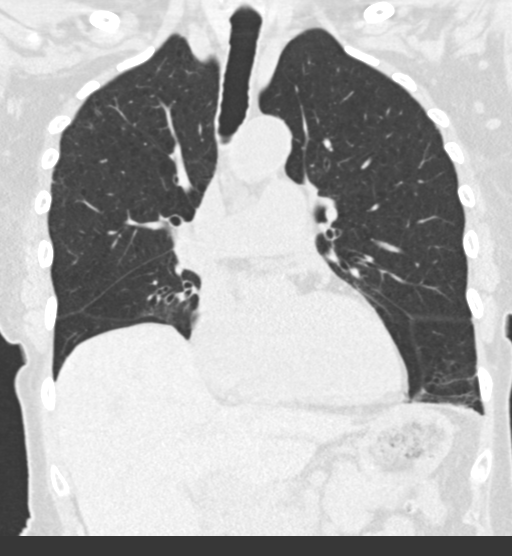
[im 67/111  lung]
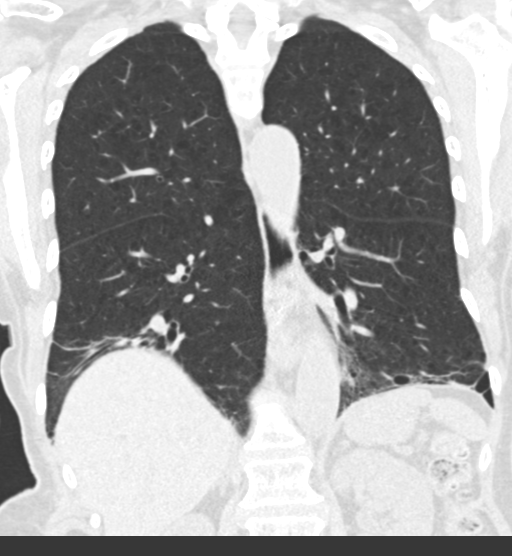

[15 of 36 positions shown; findings below may reference images not displayed]

FINDINGS: Cardiovascular: Normal heart size. No significant pericardial
fluid/thickening. Left main coronary atherosclerosis.
Atherosclerotic nonaneurysmal thoracic aorta. Stable dilated main
pulmonary artery (3.6 cm diameter).

Mediastinum/Nodes: No discrete thyroid nodules. Unremarkable
esophagus. No pathologically enlarged axillary, mediastinal or gross
hilar lymph nodes, noting limited sensitivity for the detection of
hilar adenopathy on this noncontrast study.

Lungs/Pleura: No pneumothorax. No pleural effusion. Moderate
centrilobular emphysema. There are a few scattered solid pulmonary
nodules throughout both lungs, largest 6 mm in the anterior left
lower lobe (series 3/ image 114), all stable since 06/17/2016. No
acute consolidative airspace disease, lung masses or additional
significant pulmonary nodules. There is basilar predominant patchy
subpleural reticulation, parenchymal distortion and mild traction
bronchiectasis in both lungs. There is probable mild honeycombing in
the dependent basilar lower lobes bilaterally. These findings have
not convincingly progressed since the lung base images from the
12/02/2013 CT abdomen/pelvis study.

Upper abdomen: Mild diverticulosis in the visualized splenic flexure
of the colon.

Musculoskeletal: No aggressive appearing focal osseous lesions. Mild
thoracic spondylosis.
IMPRESSION: 1. Stable scattered small pulmonary nodules measuring up to 6 mm in
the left lower lobe, for which 4 month stability has been
demonstrated, probably benign. Follow-up chest CT is recommended in
12-18 months. This recommendation follows the consensus statement:
Guidelines for Management of Incidental Pulmonary Nodules Detected
[DATE].
2. Basilar predominant fibrotic interstitial lung disease with mild
honeycombing, not convincingly changed since the lung base images
from the 12/02/2013 CT study, most suggestive of fibrotic
nonspecific interstitial pneumonia (NSIP). High-resolution chest CT
follow-up is recommended in 12 months to ensure continued stability.
3. Aortic atherosclerosis.  Coronary atherosclerosis.
4. Stable dilated main pulmonary artery, suggesting chronic
pulmonary arterial hypertension.
5. Moderate centrilobular emphysema.

## 2018-04-08 LAB — HM MAMMOGRAPHY

## 2018-04-16 ENCOUNTER — Encounter: Payer: Self-pay | Admitting: *Deleted

## 2018-04-16 ENCOUNTER — Other Ambulatory Visit: Payer: Self-pay | Admitting: Internal Medicine

## 2018-05-08 ENCOUNTER — Ambulatory Visit: Payer: Medicare Other | Admitting: Internal Medicine

## 2018-05-08 ENCOUNTER — Encounter: Payer: Self-pay | Admitting: Internal Medicine

## 2018-05-08 VITALS — BP 142/78 | HR 72 | Temp 97.3°F | Resp 16 | Ht 66.0 in | Wt 143.6 lb

## 2018-05-08 DIAGNOSIS — E559 Vitamin D deficiency, unspecified: Secondary | ICD-10-CM

## 2018-05-08 DIAGNOSIS — Z87891 Personal history of nicotine dependence: Secondary | ICD-10-CM | POA: Insufficient documentation

## 2018-05-08 DIAGNOSIS — I10 Essential (primary) hypertension: Secondary | ICD-10-CM | POA: Diagnosis not present

## 2018-05-08 DIAGNOSIS — R7309 Other abnormal glucose: Secondary | ICD-10-CM | POA: Diagnosis not present

## 2018-05-08 DIAGNOSIS — R7303 Prediabetes: Secondary | ICD-10-CM

## 2018-05-08 DIAGNOSIS — Z79899 Other long term (current) drug therapy: Secondary | ICD-10-CM

## 2018-05-08 DIAGNOSIS — K219 Gastro-esophageal reflux disease without esophagitis: Secondary | ICD-10-CM

## 2018-05-08 DIAGNOSIS — E782 Mixed hyperlipidemia: Secondary | ICD-10-CM

## 2018-05-08 DIAGNOSIS — Z8249 Family history of ischemic heart disease and other diseases of the circulatory system: Secondary | ICD-10-CM | POA: Insufficient documentation

## 2018-05-08 MED ORDER — RANITIDINE HCL 300 MG PO TABS: ORAL_TABLET | ORAL | 3 refills | Status: DC

## 2018-05-08 MED ORDER — ENALAPRIL MALEATE 20 MG PO TABS: ORAL_TABLET | ORAL | 3 refills | Status: DC

## 2018-05-08 NOTE — Patient Instructions (Signed)

## 2018-05-08 NOTE — Progress Notes (Signed)
This very nice 77 y.o. WBF presents for 6  month follow up with HTN, HLD, Pre-Diabetes and Vitamin D Deficiency. Patient is followed by Dr Ashok Cordia for a 6 mm LLL nodule 1st noted by CT scan in May 20218. She also has GERD controlled on her current meds.      Patient is treated for HTN (age 79 in 32) & BP has been controlled at home. Today's  . Patient had recent negative Cardiac Echo & Stress Myoview thru Dr [facility] office. Patient has had no complaints of any cardiac type chest pain, palpitations, dyspnea / orthopnea / PND, dizziness, claudication, or dependent edema.     Hyperlipidemia is controlled with diet & meds. Patient denies myalgias or other med SE's. Last Lipids were  Lab Results  Component Value Date   CHOL 215 (H) 01/29/2018   HDL 69 01/29/2018   LDLCALC 131 (H) 01/29/2018   TRIG 54 01/29/2018   CHOLHDL 3.1 01/29/2018      Also, the patient has history of PreDiabetes  (A1c 6.0%/2014) and has had no symptoms of reactive hypoglycemia, diabetic polys, paresthesias or visual blurring.  Last A1c was not at goal: Lab Results  Component Value Date   HGBA1C 5.8 (H) 01/29/2018      Further, the patient also has history of Vitamin D Deficiency ("18"/2008)  and supplements vitamin D without any suspected side-effects. Last vitamin D was at goal: Lab Results  Component Value Date   VD25OH 57 10/17/2017   Current Outpatient Medications on File Prior to Visit  Medication Sig  . acetaminophen (TYLENOL) 500 MG tablet Take 500 mg by mouth every 6 (six) hours as needed (pain).  Marland Kitchen amLODipine (NORVASC) 10 MG tablet TAKE 1 TABLET BY MOUTH EVERY DAY FOR BLOOD PRESSURE  . aspirin EC 81 MG tablet Take 81 mg by mouth daily.    . Calcium Citrate (CAL-CITRATE PO) Take by mouth.  . cholecalciferol (VITAMIN D) 1000 units tablet Take 6,000 Units by mouth daily after lunch.   . cloNIDine (CATAPRES) 0.2 MG tablet TAKE 1 TABLET BY MOUTH TWICE A DAY FOR BLOOD PRESSURE  . Flaxseed, Linseed, (FLAX  SEED OIL) 1000 MG CAPS Take 1,000 mg by mouth daily after lunch.   . halobetasol (ULTRAVATE) 0.05 % cream Apply topically 2 (two) times daily.  . Magnesium 500 MG TABS Take 500 mg by mouth.  . Naphazoline HCl (CLEAR EYES OP) Place 1 drop into both eyes daily.  . nitroGLYCERIN (NITROSTAT) 0.4 MG SL tablet Place 1 tablet (0.4 mg total) under the tongue every 5 (five) minutes as needed for chest pain.  Marland Kitchen OVER THE COUNTER MEDICATION Takes OTC eczema cream PRN.  . ranitidine (ZANTAC) 300 MG tablet Take 1 tablet (300 mg total) by mouth at bedtime.  . triamterene-hydrochlorothiazide (MAXZIDE-25) 37.5-25 MG tablet TAKE 1 TABLET BY MOUTH EVERY DAY  . Wheat Dextrin (BENEFIBER PO) Take 1 Dose by mouth daily.   No current facility-administered medications on file prior to visit.    Allergies  Allergen Reactions  . Lemon Oil Hives    Reaction to lemons  . Shellfish Allergy Swelling and Hives   PMHx:   Past Medical History:  Diagnosis Date  . Allergic rhinitis   . Arthritis   . Bronchiectasis ([place])   . Colon polyps   . Diverticulosis   . Emphysema of lung ([place])   . GERD (gastroesophageal reflux disease)   . HSV-1 (herpes simplex virus 1) infection   . Hypertension  Immunization History  Administered Date(s) Administered  . DTaP 07/30/2011  . Influenza, High Dose Seasonal PF 06/07/2016, 06/25/2017  . Pneumococcal Conjugate-13 11/22/2015  . Pneumococcal Polysaccharide-23 05/11/2013  . Pneumococcal-Unspecified 01/07/2002  . Td 01/07/2002, 05/07/2012   Past Surgical History:  Procedure Laterality Date  . ABDOMINAL HYSTERECTOMY    . COLONOSCOPY    . DIRECT LARYNGOSCOPY N/A 10/21/2014   Procedure: DIRECT LARYNGOSCOPY;  Surgeon: Rozetta Nunnery, MD;  Location: [place];  Service: ENT;  Laterality: N/A;  . EXCISION NASAL MASS Right 10/21/2014   Procedure: EXCISION NASOPHARYNGEAL MASS;  Surgeon: Rozetta Nunnery, MD;  Location: [place];  Service: ENT;   Laterality: Right;  . EYE SURGERY     cataracts  . MASS EXCISION Right 10/21/2014   Procedure: RIGHT NECK NODE BIOPSY;  Surgeon: Rozetta Nunnery, MD;  Location: [place];  Service: ENT;  Laterality: Right;   FHx:    Reviewed / unchanged  SHx:    Reviewed / unchanged   Systems Review:  Constitutional: Denies fever, chills, wt changes, headaches, insomnia, fatigue, night sweats, change in appetite. Eyes: Denies redness, blurred vision, diplopia, discharge, itchy, watery eyes.  ENT: Denies discharge, congestion, post nasal drip, epistaxis, sore throat, earache, hearing loss, dental pain, tinnitus, vertigo, sinus pain, snoring.  CV: Denies chest pain, palpitations, irregular heartbeat, syncope, dyspnea, diaphoresis, orthopnea, PND, claudication or edema. Respiratory: denies cough, dyspnea, DOE, pleurisy, hoarseness, laryngitis, wheezing.  Gastrointestinal: Denies dysphagia, odynophagia, heartburn, reflux, water brash, abdominal pain or cramps, nausea, vomiting, bloating, diarrhea, constipation, hematemesis, melena, hematochezia  or hemorrhoids. Genitourinary: Denies dysuria, frequency, urgency, nocturia, hesitancy, discharge, hematuria or flank pain. Musculoskeletal: Denies arthralgias, myalgias, stiffness, jt. swelling, pain, limping or strain/sprain.  Skin: Denies pruritus, rash, hives, warts, acne, eczema or change in skin lesion(s). Neuro: No weakness, tremor, incoordination, spasms, paresthesia or pain. Psychiatric: Denies confusion, memory loss or sensory loss. Endo: Denies change in weight, skin or hair change.  Heme/Lymph: No excessive bleeding, bruising or enlarged lymph nodes.  Physical Exam  There were no vitals taken for this visit.  Appears  well nourished, well groomed  and in no distress.  Eyes: PERRLA, EOMs, conjunctiva no swelling or erythema. Sinuses: No frontal/maxillary tenderness ENT/Mouth: EAC's clear, TM's nl w/o erythema, bulging. Nares clear  w/o erythema, swelling, exudates. Oropharynx clear without erythema or exudates. Oral hygiene is good. Tongue normal, non obstructing. Hearing intact.  Neck: Supple. Thyroid not palpable. Car 2+/2+ without bruits, nodes or JVD. Chest: Respirations nl with BS clear & equal w/o rales, rhonchi, wheezing or stridor.  Cor: Heart sounds normal w/ regular rate and rhythm without sig. murmurs, gallops, clicks or rubs. Peripheral pulses normal and equal  without edema.  Abdomen: Soft & bowel sounds normal. Non-tender w/o guarding, rebound, hernias, masses or organomegaly.  Lymphatics: Unremarkable.  Musculoskeletal: Full ROM all peripheral extremities, joint stability, 5/5 strength and normal gait.  Skin: Warm, dry without exposed rashes, lesions or ecchymosis apparent.  Neuro: Cranial nerves intact, reflexes equal bilaterally. Sensory-motor testing grossly intact. Tendon reflexes grossly intact.  Pysch: Alert & oriented x 3.  Insight and judgement nl & appropriate. No ideations.  Assessment and Plan:  1. Essential hypertension  - Continue medication, monitor blood pressure at home.  - Continue DASH diet.  Reminder to go to the ER if any CP,  SOB, nausea, dizziness, severe HA, changes vision/speech.  - CBC with Differential/Platelet - COMPLETE METABOLIC PANEL WITH GFR - Magnesium - TSH - enalapril (  VASOTEC) 20 MG tablet; Take 1 tablet every morning for BP  Dispense: 90 tablet; Refill: 3  2. Hyperlipidemia, mixed  - Continue diet/meds, exercise,& lifestyle modifications.  - Continue monitor periodic cholesterol/liver & renal functions   - Lipid panel - TSH  3. Abnormal glucose  - Continue diet, exercise, lifestyle modifications.  - Monitor appropriate labs.  - Hemoglobin A1c - Insulin, random  4. Vitamin D deficiency  - Continue supplementation.  - VITAMIN D 25 Hydroxyl  5. Prediabetes  - Hemoglobin A1c - Insulin, random  6. Gastroesophageal reflux disease  - CBC with  Differential/Platelet - ranitidine (ZANTAC) 300 MG tablet; Take 1 tablet daily for heartburn & Reflux  Dispense: 90 tablet; Refill: 3  7. Medication management  - CBC with Differential/Platelet - COMPLETE METABOLIC PANEL WITH GFR - Magnesium - Lipid panel - TSH - Hemoglobin A1c - Insulin, random - VITAMIN D 25 Hydroxyl        Discussed  regular exercise, BP monitoring, weight control to achieve/maintain BMI less than 25 and discussed med and SE's. Recommended labs to assess and monitor clinical status with further disposition pending results of labs. Over 30 minutes of exam, counseling, chart review was performed.

## 2018-05-10 ENCOUNTER — Other Ambulatory Visit: Payer: Self-pay | Admitting: Physician Assistant

## 2018-05-10 ENCOUNTER — Encounter: Payer: Self-pay | Admitting: Internal Medicine

## 2018-05-11 LAB — LIPID PANEL
CHOL/HDL RATIO: 2.9 (calc) (ref <5.0)
CHOLESTEROL: 207 mg/dL — AB (ref <200)
HDL: 72 mg/dL (ref >50)
LDL Cholesterol (Calc): 117 mg/dL (calc) — ABNORMAL HIGH
Non-HDL Cholesterol (Calc): 135 mg/dL (calc) — ABNORMAL HIGH (ref <130)
Triglycerides: 79 mg/dL (ref <150)

## 2018-05-11 LAB — CBC WITH DIFFERENTIAL/PLATELET
BASOS ABS: 18 {cells}/uL (ref 0–200)
Basophils Relative: 0.4 %
EOS PCT: 6.1 %
Eosinophils Absolute: 275 cells/uL (ref 15–500)
HEMATOCRIT: 39.1 % (ref 35.0–45.0)
HEMOGLOBIN: 12.9 g/dL (ref 11.7–15.5)
LYMPHS ABS: 1598 {cells}/uL (ref 850–3900)
MCH: 28.5 pg (ref 27.0–33.0)
MCHC: 33 g/dL (ref 32.0–36.0)
MCV: 86.3 fL (ref 80.0–100.0)
MPV: 8.9 fL (ref 7.5–12.5)
Monocytes Relative: 9.7 %
NEUTROS PCT: 48.3 %
Neutro Abs: 2174 cells/uL (ref 1500–7800)
Platelets: 367 10*3/uL (ref 140–400)
RBC: 4.53 10*6/uL (ref 3.80–5.10)
RDW: 14.5 % (ref 11.0–15.0)
Total Lymphocyte: 35.5 %
WBC: 4.5 10*3/uL (ref 3.8–10.8)
WBCMIX: 437 {cells}/uL (ref 200–950)

## 2018-05-11 LAB — MAGNESIUM: MAGNESIUM: 2.1 mg/dL (ref 1.5–2.5)

## 2018-05-11 LAB — COMPLETE METABOLIC PANEL WITH GFR
AG Ratio: 1.3 (calc) (ref 1.0–2.5)
ALT: 9 U/L (ref 6–29)
AST: 13 U/L (ref 10–35)
Albumin: 4.2 g/dL (ref 3.6–5.1)
Alkaline phosphatase (APISO): 57 U/L (ref 33–130)
BUN: 16 mg/dL (ref 7–25)
CALCIUM: 10.5 mg/dL — AB (ref 8.6–10.4)
CO2: 28 mmol/L (ref 20–32)
CREATININE: 0.9 mg/dL (ref 0.60–0.93)
Chloride: 105 mmol/L (ref 98–110)
GFR, EST NON AFRICAN AMERICAN: 62 mL/min/{1.73_m2} (ref >=60)
GFR, Est African American: 71 mL/min/{1.73_m2} (ref >=60)
GLUCOSE: 101 mg/dL — AB (ref 65–99)
Globulin: 3.2 g/dL (calc) (ref 1.9–3.7)
Potassium: 4 mmol/L (ref 3.5–5.3)
SODIUM: 141 mmol/L (ref 135–146)
Total Bilirubin: 0.3 mg/dL (ref 0.2–1.2)
Total Protein: 7.4 g/dL (ref 6.1–8.1)

## 2018-05-11 LAB — TSH: TSH: 0.98 mIU/L (ref 0.40–4.50)

## 2018-05-11 LAB — INSULIN, RANDOM: INSULIN: 5 u[IU]/mL (ref 2.0–19.6)

## 2018-05-11 LAB — HEMOGLOBIN A1C
EAG (MMOL/L): 6.6 (calc)
Hgb A1c MFr Bld: 5.8 % of total Hgb — ABNORMAL HIGH (ref <5.7)
Mean Plasma Glucose: 120 (calc)

## 2018-05-11 LAB — VITAMIN D 25 HYDROXY (VIT D DEFICIENCY, FRACTURES): Vit D, 25-Hydroxy: 78 ng/mL (ref 30–100)

## 2018-06-30 ENCOUNTER — Other Ambulatory Visit: Payer: Self-pay | Admitting: Internal Medicine

## 2018-07-07 ENCOUNTER — Telehealth: Payer: Self-pay | Admitting: *Deleted

## 2018-07-07 NOTE — Telephone Encounter (Signed)
Called patient to confirm she is not longer taking Clonidine, due to being discontinued at her last visit. Patient confirmed she is currently taking Enalapril.  A fax was sent to [facility] to discontinue the medication.

## 2018-08-10 NOTE — Progress Notes (Signed)
Patient ID: Durwin Reges, female   DOB: Sep 23, 1940, 77 y.o.   MRN: [id number]  Assessment and Plan:  Hypertension:  -Continue medication,  -monitor blood pressure at home.  -Continue DASH diet.   -Reminder to go to the ER if any CP, SOB, nausea, dizziness, severe HA, changes vision/speech, left arm numbness and tingling, and jaw pain.  Cholesterol: -Continue diet and exercise.  -Check cholesterol.   Pre-diabetes: -Continue diet and exercise.  -Check A1C  Vitamin D Def: -check level -continue medications.   Cough Likely from ACE- will switch to ARB Lungs CTAB, will call if anything changes  COPD, mild (HCC) Continue follow up pulmonary, continue zantac  Bronchiectasis without acute exacerbation (HCC) Continue follow up pulmonary, continue zantac  ILD (interstitial lung disease) ([place]) Continue follow up pulmonary, continue zantac  Aortic atherosclerosis (HCC) Control blood pressure, cholesterol, glucose, increase exercise.   Continue diet and meds as discussed. Further disposition pending results of labs. Future Appointments  Date Time Provider [place]  11/13/2018 10:00 AM Unk Pinto, MD [facility] None  02/02/2019  9:00 AM Liane Comber, NP [facility] None    HPI 77 y.o. AA female  presents for 3 month follow up with hypertension, hyperlipidemia, prediabetes and vitamin D.   Her blood pressure has been controlled at home, today their BP is BP: 112/76.   She does workout.   She denies chest pain, shortness of breath, dizziness. Normal myoview stress test/echo 12/2017 She has had a cough x the fall, she is on ACE x the fall. She has throat tickle.    She is not on cholesterol medication and denies myalgias. Her cholesterol is not at goal. The cholesterol last visit was:   Lab Results  Component Value Date   CHOL 207 (H) 05/08/2018   HDL 72 05/08/2018   LDLCALC 117 (H) 05/08/2018   TRIG 79 05/08/2018   CHOLHDL 2.9 05/08/2018    She has been  working on diet and exercise for prediabetes, and denies foot ulcerations, hyperglycemia, hypoglycemia , increased appetite, nausea, paresthesia of the feet, polydipsia, polyuria, visual disturbances, vomiting and weight loss. Last A1C in the office was:  Lab Results  Component Value Date   HGBA1C 5.8 (H) 05/08/2018   Patient is on Vitamin D supplement.  Lab Results  Component Value Date   VD25OH 78 05/08/2018     BMI is Body mass index is 23.21 kg/m., she is working on diet and exercise. Wt Readings from Last 3 Encounters:  08/12/18 143 lb 12.8 oz (65.2 kg)  05/08/18 143 lb 9.6 oz (65.1 kg)  01/29/18 145 lb (65.8 kg)     Current Medications:  Current Outpatient Medications on File Prior to Visit  Medication Sig Dispense Refill  . acetaminophen (TYLENOL) 500 MG tablet Take 500 mg by mouth every 6 (six) hours as needed (pain).    Marland Kitchen amLODipine (NORVASC) 10 MG tablet TAKE 1 TABLET BY MOUTH EVERY DAY FOR BLOOD PRESSURE 90 tablet 3  . aspirin EC 81 MG tablet Take 81 mg by mouth daily.      . Calcium Citrate (CAL-CITRATE PO) Take by mouth.    . cholecalciferol (VITAMIN D) 1000 units tablet Take 6,000 Units by mouth daily after lunch.     . enalapril (VASOTEC) 20 MG tablet Take 1 tablet every morning for BP 90 tablet 3  . Flaxseed, Linseed, (FLAX SEED OIL) 1000 MG CAPS Take 1,000 mg by mouth daily after lunch.     . halobetasol (ULTRAVATE) 0.05 %  cream Apply topically 2 (two) times daily. 50 g 0  . Magnesium 500 MG TABS Take 500 mg by mouth.    . Naphazoline HCl (CLEAR EYES OP) Place 1 drop into both eyes daily.    Marland Kitchen OVER THE COUNTER MEDICATION Takes OTC eczema cream PRN.    . ranitidine (ZANTAC) 300 MG tablet Take 1 tablet daily for heartburn & Reflux 90 tablet 3  . triamterene-hydrochlorothiazide (MAXZIDE-25) 37.5-25 MG tablet TAKE 1 TABLET BY MOUTH EVERY DAY 90 tablet 1  . Wheat Dextrin (BENEFIBER PO) Take 1 Dose by mouth daily.    . nitroGLYCERIN (NITROSTAT) 0.4 MG SL tablet Place 1  tablet (0.4 mg total) under the tongue every 5 (five) minutes as needed for chest pain. 25 tablet 3   No current facility-administered medications on file prior to visit.     Medical History:  Past Medical History:  Diagnosis Date  . Allergic rhinitis   . Arthritis   . Bronchiectasis ([place])   . Colon polyps   . Diverticulosis   . Emphysema of lung ([place])   . GERD (gastroesophageal reflux disease)   . HSV-1 (herpes simplex virus 1) infection   . Hypertension     Allergies:  Allergies  Allergen Reactions  . Lemon Oil Hives    Reaction to lemons  . Shellfish Allergy Swelling and Hives     Review of Systems:  Review of Systems  Constitutional: Negative for chills, fever and malaise/fatigue.  HENT: Negative for congestion, ear pain and sore throat.   Respiratory: Positive for cough. Negative for hemoptysis, sputum production, shortness of breath and wheezing.   Cardiovascular: Negative for chest pain, palpitations and leg swelling.  Gastrointestinal: Negative for abdominal pain, blood in stool, constipation, diarrhea, heartburn and melena.  Genitourinary: Negative.   Skin: Negative.   Neurological: Negative for dizziness, sensory change, loss of consciousness and headaches.  Psychiatric/Behavioral: Negative for depression. The patient is not nervous/anxious and does not have insomnia.     Family history- Review and unchanged  Social history- Review and unchanged  Physical Exam: BP 112/76   Pulse 71   Temp 98.9 F (37.2 C)   Resp 14   Ht 5\' 6"  (1.676 m)   Wt 143 lb 12.8 oz (65.2 kg)   SpO2 99%   BMI 23.21 kg/m  Wt Readings from Last 3 Encounters:  08/12/18 143 lb 12.8 oz (65.2 kg)  05/08/18 143 lb 9.6 oz (65.1 kg)  01/29/18 145 lb (65.8 kg)    General Appearance: Well nourished well developed, in no apparent distress. Eyes: PERRLA, EOMs, conjunctiva no swelling or erythema ENT/Mouth: Ear canals normal without obstruction, swelling, erythma, discharge.  TMs  normal bilaterally.  Oropharynx moist, clear, without exudate, or postoropharyngeal swelling. Neck: Supple, thyroid normal,no cervical adenopathy  Respiratory: Respiratory effort normal, Breath sounds clear A&P without rhonchi, wheeze, or rale.  No retractions, no accessory usage. Cardio: RRR with mild systolic murmur no RGs. Brisk peripheral pulses without edema.  Abdomen: Soft, + BS,  Non tender, no guarding, rebound, hernias, masses. Musculoskeletal: Full ROM, 5/5 strength, Normal gait Skin: Warm, dry without rashes, lesions, ecchymosis.  Neuro: Awake and oriented X 3, Cranial nerves intact. Normal muscle tone, no cerebellar symptoms. Psych: Normal affect, Insight and Judgment appropriate.    Vicie Mutters, PA-C 9:48 AM [facility]

## 2018-08-12 ENCOUNTER — Ambulatory Visit: Payer: Medicare Other | Admitting: Physician Assistant

## 2018-08-12 ENCOUNTER — Ambulatory Visit: Payer: Self-pay | Admitting: Adult Health

## 2018-08-12 ENCOUNTER — Encounter: Payer: Self-pay | Admitting: Physician Assistant

## 2018-08-12 VITALS — BP 112/76 | HR 71 | Temp 98.9°F | Resp 14 | Ht 66.0 in | Wt 143.8 lb

## 2018-08-12 DIAGNOSIS — J449 Chronic obstructive pulmonary disease, unspecified: Secondary | ICD-10-CM | POA: Diagnosis not present

## 2018-08-12 DIAGNOSIS — N183 Chronic kidney disease, stage 3 unspecified: Secondary | ICD-10-CM

## 2018-08-12 DIAGNOSIS — J479 Bronchiectasis, uncomplicated: Secondary | ICD-10-CM | POA: Diagnosis not present

## 2018-08-12 DIAGNOSIS — I10 Essential (primary) hypertension: Secondary | ICD-10-CM | POA: Diagnosis not present

## 2018-08-12 DIAGNOSIS — E782 Mixed hyperlipidemia: Secondary | ICD-10-CM

## 2018-08-12 DIAGNOSIS — E785 Hyperlipidemia, unspecified: Secondary | ICD-10-CM

## 2018-08-12 DIAGNOSIS — Z79899 Other long term (current) drug therapy: Secondary | ICD-10-CM

## 2018-08-12 DIAGNOSIS — J849 Interstitial pulmonary disease, unspecified: Secondary | ICD-10-CM

## 2018-08-12 DIAGNOSIS — E559 Vitamin D deficiency, unspecified: Secondary | ICD-10-CM

## 2018-08-12 DIAGNOSIS — I700 Atherosclerosis of aorta: Secondary | ICD-10-CM

## 2018-08-12 DIAGNOSIS — R7309 Other abnormal glucose: Secondary | ICD-10-CM

## 2018-08-12 MED ORDER — LOSARTAN POTASSIUM 100 MG PO TABS: 100.0000 mg | ORAL_TABLET | Freq: Every day | ORAL | 0 refills | Status: DC

## 2018-08-12 NOTE — Patient Instructions (Signed)
STARTING ACE MEDICATION  ACE inhibitors are blood pressure medications that protect your heart and kidneys. The most common symptom is a dry cough/tickle in your throat that can happen the first day you take it or 5 years after you have been taking it.  Will switch to a different medication when you run out   Start on the losartan 100mg , can do 1/2-1 tablet a day when you run out of the enalapril.   HYPERTENSION INFORMATION  Monitor your blood pressure at home, please keep a record and bring that in with you to your next office visit.   Go to the ER if any CP, SOB, nausea, dizziness, severe HA, changes vision/speech  Due to a recent study, SPRINT, we have changed our goal for the systolic or top blood pressure number. Ideally we want your top number at 120.  In the [facility] Trial, 5000 people were randomized to a goal BP of 120 and 5000 people were randomized to a goal BP of less than 140. The patients with the goal BP at 120 had LESS DEMENTIA, LESS HEART ATTACKS, AND LESS STROKES, AS WELL AS OVERALL DECREASED MORTALITY OR DEATH RATE.   If you are willing, our goal BP is the top number of 120.  Your most recent BP: BP: 112/76   Take your medications faithfully as instructed. Maintain a healthy weight. Get at least 150 minutes of aerobic exercise per week. Minimize salt intake. Minimize alcohol intake  DASH Eating Plan DASH stands for "Dietary Approaches to Stop Hypertension." The DASH eating plan is a healthy eating plan that has been shown to reduce high blood pressure (hypertension). Additional health benefits may include reducing the risk of type 2 diabetes mellitus, heart disease, and stroke. The DASH eating plan may also help with weight loss. WHAT DO I NEED TO KNOW ABOUT THE DASH EATING PLAN? For the DASH eating plan, you will follow these general guidelines:  Choose foods with a percent daily value for sodium of less than 5% (as listed on the food label).  Use salt-free  seasonings or herbs instead of table salt or sea salt.  Check with your health care provider or pharmacist before using salt substitutes.  Eat lower-sodium products, often labeled as "lower sodium" or "no salt added."  Eat fresh foods.  Eat more vegetables, fruits, and low-fat dairy products.  Choose whole grains. Look for the word "whole" as the first word in the ingredient list.  Choose fish and skinless chicken or [place] more often than red meat. Limit fish, poultry, and meat to 6 oz (170 g) each day.  Limit sweets, desserts, sugars, and sugary drinks.  Choose heart-healthy fats.  Limit cheese to 1 oz (28 g) per day.  Eat more home-cooked food and less restaurant, buffet, and fast food.  Limit fried foods.  Cook foods using methods other than frying.  Limit canned vegetables. If you do use them, rinse them well to decrease the sodium.  When eating at a restaurant, ask that your food be prepared with less salt, or no salt if possible. WHAT FOODS CAN I EAT? Seek help from a dietitian for individual calorie needs. Grains Whole grain or whole wheat bread. Brown rice. Whole grain or whole wheat pasta. Quinoa, bulgur, and whole grain cereals. Low-sodium cereals. Corn or whole wheat flour tortillas. Whole grain cornbread. Whole grain crackers. Low-sodium crackers. Vegetables Fresh or frozen vegetables (raw, steamed, roasted, or grilled). Low-sodium or reduced-sodium tomato and vegetable juices. Low-sodium or reduced-sodium tomato sauce  and paste. Low-sodium or reduced-sodium canned vegetables.  Fruits All fresh, canned (in natural juice), or frozen fruits. Meat and Other Protein Products Ground beef (85% or leaner), grass-fed beef, or beef trimmed of fat. Skinless chicken or [place]. Ground chicken or [place]. Pork trimmed of fat. All fish and seafood. Eggs. Dried beans, peas, or lentils. Unsalted nuts and seeds. Unsalted canned beans. Dairy Low-fat dairy products, such as skim or  1% milk, 2% or reduced-fat cheeses, low-fat ricotta or cottage cheese, or plain low-fat yogurt. Low-sodium or reduced-sodium cheeses. Fats and Oils Tub margarines without trans fats. Light or reduced-fat mayonnaise and salad dressings (reduced sodium). Avocado. Safflower, olive, or canola oils. Natural peanut or almond butter. Other Unsalted popcorn and pretzels. The items listed above may not be a complete list of recommended foods or beverages. Contact your dietitian for more options. WHAT FOODS ARE NOT RECOMMENDED? Grains White bread. White pasta. White rice. Refined cornbread. Bagels and croissants. Crackers that contain trans fat. Vegetables Creamed or fried vegetables. Vegetables in a cheese sauce. Regular canned vegetables. Regular canned tomato sauce and paste. Regular tomato and vegetable juices. Fruits Dried fruits. Canned fruit in light or heavy syrup. Fruit juice. Meat and Other Protein Products Fatty cuts of meat. Ribs, chicken wings, bacon, sausage, bologna, salami, chitterlings, fatback, hot dogs, bratwurst, and packaged luncheon meats. Salted nuts and seeds. Canned beans with salt. Dairy Whole or 2% milk, cream, half-and-half, and cream cheese. Whole-fat or sweetened yogurt. Full-fat cheeses or blue cheese. Nondairy creamers and whipped toppings. Processed cheese, cheese spreads, or cheese curds. Condiments Onion and garlic salt, seasoned salt, table salt, and sea salt. Canned and packaged gravies. Worcestershire sauce. Tartar sauce. Barbecue sauce. Teriyaki sauce. Soy sauce, including reduced sodium. Steak sauce. Fish sauce. Oyster sauce. Cocktail sauce. Horseradish. Ketchup and mustard. Meat flavorings and tenderizers. Bouillon cubes. Hot sauce. Tabasco sauce. Marinades. Taco seasonings. Relishes. Fats and Oils Butter, stick margarine, lard, shortening, ghee, and bacon fat. Coconut, palm kernel, or palm oils. Regular salad dressings. Other Pickles and olives. Salted popcorn  and pretzels. The items listed above may not be a complete list of foods and beverages to avoid. Contact your dietitian for more information. WHERE CAN I FIND MORE INFORMATION? National Heart, Lung, and Blood Institute: travelstabloid.com Document Released: 08/22/2011 Document Revised: 01/17/2014 Document Reviewed: 07/07/2013 [facility] Patient Information 2015 [place], [name]. This information is not intended to replace advice given to you by your health care provider. Make sure you discuss any questions you have with your health care provider.

## 2018-08-13 LAB — COMPLETE METABOLIC PANEL WITH GFR
AG Ratio: 1.2 (calc) (ref 1.0–2.5)
ALT: 21 U/L (ref 6–29)
AST: 16 U/L (ref 10–35)
Albumin: 4.1 g/dL (ref 3.6–5.1)
Alkaline phosphatase (APISO): 63 U/L (ref 33–130)
BUN/Creatinine Ratio: 15 (calc) (ref 6–22)
BUN: 16 mg/dL (ref 7–25)
CALCIUM: 10.7 mg/dL — AB (ref 8.6–10.4)
CO2: 27 mmol/L (ref 20–32)
CREATININE: 1.04 mg/dL — AB (ref 0.60–0.93)
Chloride: 103 mmol/L (ref 98–110)
GFR, EST AFRICAN AMERICAN: 60 mL/min/{1.73_m2} (ref >=60)
GFR, Est Non African American: 52 mL/min/{1.73_m2} — ABNORMAL LOW (ref >=60)
Globulin: 3.5 g/dL (calc) (ref 1.9–3.7)
Glucose, Bld: 81 mg/dL (ref 65–99)
Potassium: 4.4 mmol/L (ref 3.5–5.3)
Sodium: 138 mmol/L (ref 135–146)
TOTAL PROTEIN: 7.6 g/dL (ref 6.1–8.1)
Total Bilirubin: 0.3 mg/dL (ref 0.2–1.2)

## 2018-08-13 LAB — HEMOGLOBIN A1C
HEMOGLOBIN A1C: 5.7 %{Hb} — AB (ref <5.7)
Mean Plasma Glucose: 117 (calc)
eAG (mmol/L): 6.5 (calc)

## 2018-08-13 LAB — CBC WITH DIFFERENTIAL/PLATELET
BASOS ABS: 21 {cells}/uL (ref 0–200)
Basophils Relative: 0.4 %
Eosinophils Absolute: 323 cells/uL (ref 15–500)
Eosinophils Relative: 6.1 %
HEMATOCRIT: 38.1 % (ref 35.0–45.0)
HEMOGLOBIN: 12.8 g/dL (ref 11.7–15.5)
Lymphs Abs: 1564 cells/uL (ref 850–3900)
MCH: 28.9 pg (ref 27.0–33.0)
MCHC: 33.6 g/dL (ref 32.0–36.0)
MCV: 86 fL (ref 80.0–100.0)
MONOS PCT: 11 %
MPV: 9 fL (ref 7.5–12.5)
NEUTROS ABS: 2809 {cells}/uL (ref 1500–7800)
Neutrophils Relative %: 53 %
Platelets: 409 10*3/uL — ABNORMAL HIGH (ref 140–400)
RBC: 4.43 10*6/uL (ref 3.80–5.10)
RDW: 14.3 % (ref 11.0–15.0)
Total Lymphocyte: 29.5 %
WBC: 5.3 10*3/uL (ref 3.8–10.8)
WBCMIX: 583 {cells}/uL (ref 200–950)

## 2018-08-13 LAB — LIPID PANEL
Cholesterol: 209 mg/dL — ABNORMAL HIGH (ref <200)
HDL: 70 mg/dL (ref >50)
LDL CHOLESTEROL (CALC): 124 mg/dL — AB
NON-HDL CHOLESTEROL (CALC): 139 mg/dL — AB (ref <130)
TRIGLYCERIDES: 63 mg/dL (ref <150)
Total CHOL/HDL Ratio: 3 (calc) (ref <5.0)

## 2018-08-13 LAB — MAGNESIUM: Magnesium: 2.1 mg/dL (ref 1.5–2.5)

## 2018-08-13 LAB — TSH: TSH: 0.91 m[IU]/L (ref 0.40–4.50)

## 2018-09-22 ENCOUNTER — Ambulatory Visit: Payer: Medicare Other | Admitting: *Deleted

## 2018-09-22 DIAGNOSIS — Z23 Encounter for immunization: Secondary | ICD-10-CM

## 2018-09-22 NOTE — Progress Notes (Signed)
Patient is here to check her PTH, due to hypercalcemia.  The patient states she takes a calcium supplement daily, but is not sure of the dose. The patient received the HD flu vaccine today.

## 2018-09-23 ENCOUNTER — Other Ambulatory Visit: Payer: Self-pay | Admitting: Physician Assistant

## 2018-09-23 LAB — PTH, INTACT AND CALCIUM
Calcium: 10.7 mg/dL — ABNORMAL HIGH (ref 8.6–10.4)
PTH: 34 pg/mL (ref 14–64)

## 2018-09-23 MED ORDER — LOSARTAN POTASSIUM 100 MG PO TABS: 100.0000 mg | ORAL_TABLET | Freq: Every day | ORAL | 0 refills | Status: DC

## 2018-10-14 ENCOUNTER — Other Ambulatory Visit: Payer: Self-pay | Admitting: Internal Medicine

## 2018-11-12 NOTE — Progress Notes (Signed)
[facility] Unk Pinto, M.D.     [name], [name] Liane Comber, Golf [address] [place] [address] Telephone [phone] Telefax [phone] Annual Screening/Preventative Visit & Comprehensive Evaluation &  Examination     This very nice 78 y.o. WBF presents for a Screening /Preventative Visit & comprehensive evaluation and management of multiple medical co-morbidities.  Patient has been followed for HTN, HLD, Prediabetes  and Vitamin D Deficiency. Patient has GERD controlled on her meds     Patient is also followed by Dr Ashok Cordia for a 6 mm LLL nodule.       HTN predates circa 2 (age 28). Patient's BP has been controlled at home and patient denies any cardiac symptoms as chest pain, palpitations, shortness of breath, dizziness or ankle swelling. Today's BP: is at goal - 110/68.      Patient's hyperlipidemia is not controlled with diet and she's very reluctant to take  Medications for Cholesterol. Patient denies myalgias or other medication SE's. Last lipids were not at goal:  Lab Results  Component Value Date   CHOL 209 (H) 08/12/2018   HDL 70 08/12/2018   LDLCALC 124 (H) 08/12/2018   TRIG 63 08/12/2018   CHOLHDL 3.0 08/12/2018      Patient has hx/o prediabetes (A1c 6.0% / 2014)  and patient denies reactive hypoglycemic symptoms, visual blurring, diabetic polys or paresthesias. Last A1c was not at goal:  Lab Results  Component Value Date   HGBA1C 5.7 (H) 08/12/2018      Finally, patient has history of Vitamin D Deficiency ("18" / 2008)  and last Vitamin D was at goal: Lab Results  Component Value Date   VD25OH 78 05/08/2018   Current Outpatient Medications on File Prior to Visit  Medication Sig  . acetaminophen (TYLENOL) 500 MG tablet Take 500 mg by mouth every 6 (six) hours as needed (pain).  Marland Kitchen amLODipine (NORVASC) 10 MG tablet TAKE 1 TABLET BY MOUTH EVERY DAY FOR  BLOOD PRESSURE  . aspirin EC 81 MG tablet Take 81 mg by mouth daily.    . Calcium Citrate (CAL-CITRATE PO) Take by mouth.  . cholecalciferol (VITAMIN D) 1000 units tablet Take 6,000 Units by mouth daily after lunch.   . Flaxseed, Linseed, (FLAX SEED OIL) 1000 MG CAPS Take 1,000 mg by mouth daily after lunch.   . halobetasol (ULTRAVATE) 0.05 % cream Apply topically 2 (two) times daily.  Marland Kitchen losartan (COZAAR) 100 MG tablet Take 1 tablet (100 mg total) by mouth daily.  . Magnesium 500 MG TABS Take 500 mg by mouth.  . Naphazoline HCl (CLEAR EYES OP) Place 1 drop into both eyes daily.  Marland Kitchen OVER THE COUNTER MEDICATION Takes OTC eczema cream PRN.  . ranitidine (ZANTAC) 300 MG tablet Take 1 tablet daily for heartburn & Reflux  . triamterene-hydrochlorothiazide (MAXZIDE-25) 37.5-25 MG tablet TAKE 1 TABLET BY MOUTH EVERY DAY  . Wheat Dextrin (BENEFIBER PO) Take 1 Dose by mouth daily.  . nitroGLYCERIN (NITROSTAT) 0.4 MG SL tablet Place 1 tablet (0.4 mg total) under the tongue every 5 (five) minutes as needed for chest pain.   No current facility-administered medications on file prior to visit.    Allergies  Allergen Reactions  . Ace Inhibitors Cough  . Lemon Oil Hives    Reaction to lemons  . Shellfish Allergy Swelling and Hives   Past Medical History:  Diagnosis Date  . Allergic rhinitis   . Arthritis   .  Bronchiectasis ([place])   . Colon polyps   . Diverticulosis   . Emphysema of lung ([place])   . GERD (gastroesophageal reflux disease)   . HSV-1 (herpes simplex virus 1) infection   . Hypertension    Health Maintenance  Topic Date Due  . MAMMOGRAM  04/09/2019  . TETANUS/TDAP  05/07/2022  . INFLUENZA VACCINE  Completed  . DEXA SCAN  Completed  . PNA vac Low Risk Adult  Completed   Immunization History  Administered Date(s) Administered  . DTaP 07/30/2011  . Influenza, High Dose Seasonal PF 06/07/2016, 06/25/2017, 09/22/2018  . Pneumococcal Conjugate-13 11/22/2015  . Pneumococcal  Polysaccharide-23 05/11/2013  . Pneumococcal-Unspecified 01/07/2002  . Td 01/07/2002, 05/07/2012   Last Colon - 10/22/2010 - Dr Sharlett Iles - recc 10 yr f/u - due Feb 2022  Last MGM - 04/13/2018  Past Surgical History:  Procedure Laterality Date  . ABDOMINAL HYSTERECTOMY    . COLONOSCOPY    . DIRECT LARYNGOSCOPY N/A 10/21/2014   Procedure: DIRECT LARYNGOSCOPY;  Surgeon: Rozetta Nunnery, MD;  Location: [facility];  Service: ENT;  Laterality: N/A;  . EXCISION NASAL MASS Right 10/21/2014   Procedure: EXCISION NASOPHARYNGEAL MASS;  Surgeon: Rozetta Nunnery, MD;  [facility];  Service: ENT;  Laterality: Right;  . EYE SURGERY     cataracts  . MASS EXCISION Right 10/21/2014   Procedure: RIGHT NECK NODE BIOPSY;  Surgeon: Rozetta Nunnery, MD;  Location: [facility];  Service: ENT;  Laterality: Right;   Family History  Problem Relation Age of Onset  . Heart attack Father   . Hypertension Mother   . Stroke Mother   . Breast cancer Sister   . Cancer Brother   . Prostate cancer Brother   . Breast cancer Maternal Grandmother   . Other Brother        Heart Issues  . Cancer Brother   . Lung disease Neg Hx   . Rheumatologic disease Neg Hx    Social History   Tobacco Use  . Smoking status: Former Smoker    Packs/day: 1.00    Years: 58.00    Pack years: 58.00    Types: Cigarettes    Start date: 10/26/1956    Last attempt to quit: 09/23/2014    Years since quitting: 4.1  . Smokeless tobacco: Never Used  . Tobacco comment: She only took a break for 2 months  Substance Use Topics  . Alcohol use: No    Alcohol/week: 0.0 standard drinks  . Drug use: No    ROS Constitutional: Denies fever, chills, weight loss/gain, headaches, insomnia,  night sweats, and change in appetite. Does c/o fatigue. Eyes: Denies redness, blurred vision, diplopia, discharge, itchy, watery eyes.  ENT: Denies discharge, congestion, post nasal drip,  epistaxis, sore throat, earache, hearing loss, dental pain, Tinnitus, Vertigo, Sinus pain, snoring.  Cardio: Denies chest pain, palpitations, irregular heartbeat, syncope, dyspnea, diaphoresis, orthopnea, PND, claudication, edema Respiratory: denies cough, dyspnea, DOE, pleurisy, hoarseness, laryngitis, wheezing.  Gastrointestinal: Denies dysphagia, heartburn, reflux, water brash, pain, cramps, nausea, vomiting, bloating, diarrhea, constipation, hematemesis, melena, hematochezia, jaundice, hemorrhoids Genitourinary: Denies dysuria, frequency, urgency, nocturia, hesitancy, discharge, hematuria, flank pain Breast: Breast lumps, nipple discharge, bleeding.  Musculoskeletal: Denies arthralgia, myalgia, stiffness, Jt. Swelling, pain, limp, and strain/sprain. Denies falls. Skin: Denies puritis, rash, hives, warts, acne, eczema, changing in skin lesion Neuro: No weakness, tremor, incoordination, spasms, paresthesia, pain Psychiatric: Denies confusion, memory loss, sensory loss. Denies Depression. Endocrine: Denies change  in weight, skin, hair change, nocturia, and paresthesia, diabetic polys, visual blurring, hyper / hypo glycemic episodes.  Heme/Lymph: No excessive bleeding, bruising, enlarged lymph nodes.  Physical Exam  BP 110/68   Pulse 72   Temp (!) 97.2 F (36.2 C)   Resp 16   Ht 5\' 6"  (1.676 m)   Wt 146 lb 9.6 oz (66.5 kg)   BMI 23.66 kg/m   General Appearance: Well nourished, well groomed and in no apparent distress.  Eyes: PERRLA, EOMs, conjunctiva no swelling or erythema, normal fundi and vessels. Sinuses: No frontal/maxillary tenderness ENT/Mouth: EACs patent / TMs  nl. Nares clear without erythema, swelling, mucoid exudates. Oral hygiene is good. No erythema, swelling, or exudate. Tongue normal, non-obstructing. Tonsils not swollen or erythematous. Hearing normal.  Neck: Supple, thyroid not palpable. No bruits, nodes or JVD. Respiratory: Respiratory effort normal.  BS equal and  clear bilateral without rales, rhonci, wheezing or stridor. Cardio: Heart sounds are normal with regular rate and rhythm and no murmurs, rubs or gallops. Peripheral pulses are normal and equal bilaterally without edema. No aortic or femoral bruits. Chest: symmetric with normal excursions and percussion. Breasts: Symmetric, without lumps, nipple discharge, retractions, or fibrocystic changes.  Abdomen: Flat, soft with bowel sounds active. Nontender, no guarding, rebound, hernias, masses, or organomegaly.  Lymphatics: Non tender without lymphadenopathy.  Genitourinary:  Musculoskeletal: Full ROM all peripheral extremities, joint stability, 5/5 strength, and normal gait. Skin: Warm and dry without rashes, lesions, cyanosis, clubbing or  ecchymosis.  Neuro: Cranial nerves intact, reflexes equal bilaterally. Normal muscle tone, no cerebellar symptoms. Sensation intact.  Pysch: Alert and oriented X 3, normal affect, Insight and Judgment appropriate.   Assessment and Plan  1. Annual Preventative Screening Examination  2. Essential hypertension  - EKG 12-Lead - Microalbumin / creatinine urine ratio - CBC with Differential/Platelet - Magnesium - TSH  3. Hyperlipidemia, mixed  - EKG 12-Lead - Lipid panel - TSH  4. Abnormal glucose  - EKG 12-Lead - TSH - Hemoglobin A1c - Insulin, random  5. Vitamin D deficiency  - VITAMIN D 25 Hydroxy  6. Prediabetes  - EKG 12-Lead  7. Gastroesophageal reflux disease  - CBC with Differential/Platelet  8. COPD, mild ([place])   9. CKD (chronic kidney disease) stage 3, GFR 30-59 ml/min (HCC)  - Microalbumin / creatinine urine ratio  10. Former smoker  - EKG 12-Lead  11. Screening for colorectal cancer   - POC Hemoccult Bld/Stl   12. FHx: heart disease  - EKG 12-Lead  13. Screening for ischemic heart disease  - EKG 12-Lead  14. Medication management  - EKG 12-Lead - Microalbumin / creatinine urine ratio - CBC with  Differential/Platelet - COMPLETE METABOLIC PANEL WITH GFR - Magnesium - Lipid panel - TSH - Hemoglobin A1c - Insulin, random - VITAMIN D 25 Hydroxyl       Patient was counseled in prudent diet to achieve/maintain BMI less than 25 for weight control, BP monitoring, regular exercise and medications. Discussed med's effects and SE's. Screening labs and tests as requested with regular follow-up as recommended. Over 40 minutes of exam, counseling, chart review and high complex critical decision making was performed.

## 2018-11-12 NOTE — Patient Instructions (Signed)

## 2018-11-13 ENCOUNTER — Ambulatory Visit: Payer: Medicare Other | Admitting: Internal Medicine

## 2018-11-13 ENCOUNTER — Encounter: Payer: Self-pay | Admitting: Internal Medicine

## 2018-11-13 VITALS — BP 110/68 | HR 72 | Temp 97.2°F | Resp 16 | Ht 66.0 in | Wt 146.6 lb

## 2018-11-13 DIAGNOSIS — Z136 Encounter for screening for cardiovascular disorders: Secondary | ICD-10-CM | POA: Diagnosis not present

## 2018-11-13 DIAGNOSIS — J449 Chronic obstructive pulmonary disease, unspecified: Secondary | ICD-10-CM

## 2018-11-13 DIAGNOSIS — I10 Essential (primary) hypertension: Secondary | ICD-10-CM

## 2018-11-13 DIAGNOSIS — Z1211 Encounter for screening for malignant neoplasm of colon: Secondary | ICD-10-CM

## 2018-11-13 DIAGNOSIS — Z0000 Encounter for general adult medical examination without abnormal findings: Secondary | ICD-10-CM

## 2018-11-13 DIAGNOSIS — Z8249 Family history of ischemic heart disease and other diseases of the circulatory system: Secondary | ICD-10-CM

## 2018-11-13 DIAGNOSIS — N183 Chronic kidney disease, stage 3 unspecified: Secondary | ICD-10-CM

## 2018-11-13 DIAGNOSIS — K219 Gastro-esophageal reflux disease without esophagitis: Secondary | ICD-10-CM

## 2018-11-13 DIAGNOSIS — Z79899 Other long term (current) drug therapy: Secondary | ICD-10-CM

## 2018-11-13 DIAGNOSIS — I700 Atherosclerosis of aorta: Secondary | ICD-10-CM

## 2018-11-13 DIAGNOSIS — Z0001 Encounter for general adult medical examination with abnormal findings: Secondary | ICD-10-CM

## 2018-11-13 DIAGNOSIS — E559 Vitamin D deficiency, unspecified: Secondary | ICD-10-CM

## 2018-11-13 DIAGNOSIS — Z87891 Personal history of nicotine dependence: Secondary | ICD-10-CM

## 2018-11-13 DIAGNOSIS — R7309 Other abnormal glucose: Secondary | ICD-10-CM

## 2018-11-13 DIAGNOSIS — R7303 Prediabetes: Secondary | ICD-10-CM

## 2018-11-13 DIAGNOSIS — Z1212 Encounter for screening for malignant neoplasm of rectum: Secondary | ICD-10-CM

## 2018-11-13 DIAGNOSIS — E782 Mixed hyperlipidemia: Secondary | ICD-10-CM

## 2018-11-13 MED ORDER — HALOBETASOL PROPIONATE 0.05 % EX CREA: TOPICAL_CREAM | Freq: Two times a day (BID) | CUTANEOUS | 11 refills | Status: AC

## 2018-11-16 LAB — MICROALBUMIN / CREATININE URINE RATIO
Creatinine, Urine: 46 mg/dL (ref 20–275)
MICROALB/CREAT RATIO: 15 ug/mg{creat} (ref <30)
Microalb, Ur: 0.7 mg/dL

## 2018-11-16 LAB — CBC WITH DIFFERENTIAL/PLATELET
Absolute Monocytes: 578 cells/uL (ref 200–950)
Basophils Absolute: 21 cells/uL (ref 0–200)
Basophils Relative: 0.5 %
Eosinophils Absolute: 230 cells/uL (ref 15–500)
Eosinophils Relative: 5.6 %
HCT: 38.5 % (ref 35.0–45.0)
Hemoglobin: 12.6 g/dL (ref 11.7–15.5)
Lymphs Abs: 1345 cells/uL (ref 850–3900)
MCH: 28.8 pg (ref 27.0–33.0)
MCHC: 32.7 g/dL (ref 32.0–36.0)
MCV: 87.9 fL (ref 80.0–100.0)
MPV: 9.3 fL (ref 7.5–12.5)
Monocytes Relative: 14.1 %
NEUTROS ABS: 1927 {cells}/uL (ref 1500–7800)
Neutrophils Relative %: 47 %
PLATELETS: 372 10*3/uL (ref 140–400)
RBC: 4.38 10*6/uL (ref 3.80–5.10)
RDW: 14.2 % (ref 11.0–15.0)
Total Lymphocyte: 32.8 %
WBC: 4.1 10*3/uL (ref 3.8–10.8)

## 2018-11-16 LAB — COMPLETE METABOLIC PANEL WITH GFR
AG Ratio: 1.2 (calc) (ref 1.0–2.5)
ALT: 12 U/L (ref 6–29)
AST: 14 U/L (ref 10–35)
Albumin: 4.1 g/dL (ref 3.6–5.1)
Alkaline phosphatase (APISO): 65 U/L (ref 37–153)
BUN: 17 mg/dL (ref 7–25)
CO2: 29 mmol/L (ref 20–32)
Calcium: 10.7 mg/dL — ABNORMAL HIGH (ref 8.6–10.4)
Chloride: 102 mmol/L (ref 98–110)
Creat: 0.9 mg/dL (ref 0.60–0.93)
GFR, Est African American: 71 mL/min/{1.73_m2} (ref >=60)
GFR, Est Non African American: 61 mL/min/{1.73_m2} (ref >=60)
Globulin: 3.4 g/dL (calc) (ref 1.9–3.7)
Glucose, Bld: 97 mg/dL (ref 65–99)
Potassium: 3.8 mmol/L (ref 3.5–5.3)
Sodium: 138 mmol/L (ref 135–146)
Total Bilirubin: 0.4 mg/dL (ref 0.2–1.2)
Total Protein: 7.5 g/dL (ref 6.1–8.1)

## 2018-11-16 LAB — LIPID PANEL
Cholesterol: 214 mg/dL — ABNORMAL HIGH (ref <200)
HDL: 87 mg/dL (ref >=50)
LDL Cholesterol (Calc): 112 mg/dL (calc) — ABNORMAL HIGH
Non-HDL Cholesterol (Calc): 127 mg/dL (calc) (ref <130)
Total CHOL/HDL Ratio: 2.5 (calc) (ref <5.0)
Triglycerides: 64 mg/dL (ref <150)

## 2018-11-16 LAB — URINALYSIS, ROUTINE W REFLEX MICROSCOPIC
Bilirubin Urine: NEGATIVE
Glucose, UA: NEGATIVE
Hgb urine dipstick: NEGATIVE
Ketones, ur: NEGATIVE
Leukocytes,Ua: NEGATIVE
Nitrite: NEGATIVE
PROTEIN: NEGATIVE
Specific Gravity, Urine: 1.011 (ref 1.001–1.03)
pH: >=8.5 — AB (ref 5.0–8.0)

## 2018-11-16 LAB — HEMOGLOBIN A1C
Hgb A1c MFr Bld: 5.5 % of total Hgb (ref <5.7)
MEAN PLASMA GLUCOSE: 111 (calc)
eAG (mmol/L): 6.2 (calc)

## 2018-11-16 LAB — MAGNESIUM: MAGNESIUM: 2.3 mg/dL (ref 1.5–2.5)

## 2018-11-16 LAB — TSH: TSH: 1.06 mIU/L (ref 0.40–4.50)

## 2018-11-16 LAB — VITAMIN D 25 HYDROXY (VIT D DEFICIENCY, FRACTURES): VIT D 25 HYDROXY: 76 ng/mL (ref 30–100)

## 2018-11-16 LAB — INSULIN, RANDOM: INSULIN: 22.2 u[IU]/mL — AB

## 2018-11-19 ENCOUNTER — Other Ambulatory Visit: Payer: Self-pay

## 2018-11-19 DIAGNOSIS — Z1212 Encounter for screening for malignant neoplasm of rectum: Principal | ICD-10-CM

## 2018-11-19 DIAGNOSIS — Z1211 Encounter for screening for malignant neoplasm of colon: Secondary | ICD-10-CM

## 2018-11-19 LAB — POC HEMOCCULT BLD/STL (HOME/3-CARD/SCREEN)
Card #2 Fecal Occult Blod, POC: NEGATIVE
Card #3 Fecal Occult Blood, POC: NEGATIVE
Fecal Occult Blood, POC: NEGATIVE

## 2018-11-20 DIAGNOSIS — Z1211 Encounter for screening for malignant neoplasm of colon: Secondary | ICD-10-CM | POA: Diagnosis not present

## 2018-12-15 ENCOUNTER — Other Ambulatory Visit: Payer: Self-pay | Admitting: Physician Assistant

## 2018-12-21 ENCOUNTER — Other Ambulatory Visit: Payer: Self-pay | Admitting: *Deleted

## 2018-12-21 MED ORDER — LOSARTAN POTASSIUM 100 MG PO TABS: 100.0000 mg | ORAL_TABLET | Freq: Every day | ORAL | 1 refills | Status: DC

## 2018-12-24 ENCOUNTER — Other Ambulatory Visit: Payer: Self-pay | Admitting: *Deleted

## 2018-12-24 MED ORDER — LOSARTAN POTASSIUM 100 MG PO TABS: 100.0000 mg | ORAL_TABLET | Freq: Every day | ORAL | 1 refills | Status: DC

## 2018-12-31 ENCOUNTER — Ambulatory Visit: Payer: Medicare Other | Admitting: Internal Medicine

## 2019-02-02 ENCOUNTER — Ambulatory Visit: Payer: Self-pay | Admitting: Adult Health

## 2019-02-02 ENCOUNTER — Ambulatory Visit: Payer: Self-pay | Admitting: Adult Health Nurse Practitioner

## 2019-02-18 ENCOUNTER — Other Ambulatory Visit: Payer: Self-pay | Admitting: Internal Medicine

## 2019-02-18 ENCOUNTER — Telehealth: Payer: Self-pay

## 2019-02-18 DIAGNOSIS — J849 Interstitial pulmonary disease, unspecified: Secondary | ICD-10-CM

## 2019-02-18 NOTE — Telephone Encounter (Signed)
Called and spoke to patient. Scheduled PFT for 02/26/2019 at 0900, OV with Lazaro Arms, NP at 10:30.  Also scheduled patient for COVID testing on 6/9.  Nothing further needed.

## 2019-02-18 NOTE — Telephone Encounter (Signed)
Pt returning call re: rescheduling PFT in appt note.

## 2019-02-19 ENCOUNTER — Encounter: Payer: Self-pay | Admitting: Adult Health

## 2019-02-19 NOTE — Progress Notes (Signed)
MEDICARE ANNUAL WELLNESS VISIT AND FOLLOW UP  Assessment:   Diagnoses and all orders for this visit:  Encounter for Medicare annual wellness exam  Essential hypertension Continue medication; reduce losartan to 50 mg only if <120/80, otherwise take 100 mg daily  Monitor blood pressure at home; call if consistently over 130/80 Continue DASH diet.   Reminder to go to the ER if any CP, SOB, nausea, dizziness, severe HA, changes vision/speech, left arm numbness and tingling and jaw pain.  Aortic atherosclerosis (HCC) Control blood pressure, cholesterol, glucose, increase exercise.   Nasopharyngeal carcinoma ([place]) Removed; resolved.  ILD (interstitial lung disease) ([place]) Followed by Dr. Ashok Cordia  COPD, mild [facility]) Followed by Dr. Ashok Cordia  Chronic seasonal allergic rhinitis Continue allergy pill  Bronchiectasis without acute exacerbation ([place]) Followed by Dr. Ashok Cordia  Gastroesophageal reflux disease, esophagitis presence not specified Well managed on current medications Discussed diet, avoiding triggers and other lifestyle changes  Diverticulosis Increase fiber and fluids, UTD on colonoscopy  Polyp of colon, unspecified part of colon, unspecified type Colonoscopy up to date  Arthritis Tylenol PRN  CKD (chronic kidney disease) stage 3, GFR 30-59 ml/min (HCC) Increase fluids, avoid NSAIDS, monitor sugars, will monitor CMP/GFR  Vitamin D deficiency Continue supplementation Check vitamin D level  Other abnormal glucose Recent A1Cs at goal Discussed diet/exercise, weight management  Defer A1C; check CMP  Medication management CBC, CMP/GFR, magnesium   HSV-1 (herpes simplex virus 1) infection Antiviral PRN  Dyslipidemia Start low dose medication if remains elevated Continue low cholesterol diet and exercise.  Check lipid panel.   Osteopenia - get dexa, continue Vit D and Ca, weight bearing exercises   Over 40 minutes of exam, counseling, chart review and  critical decision making was performed Future Appointments  Date Time Provider Mariano Colon  02/23/2019 10:10 AM [facility]-S[facility]ENING [facility] None  02/26/2019  9:00 AM [facility]-PFT RM [facility] None  02/26/2019 10:30 AM Fenton Foy, NP [facility] None  05/27/2019 10:30 AM Unk Pinto, MD [facility] None  11/19/2019 10:00 AM Unk Pinto, MD [facility] None     Plan:   During the course of the visit the patient was educated and counseled about appropriate screening and preventive services including:    Pneumococcal vaccine   Prevnar 13  Influenza vaccine  Td vaccine  Screening electrocardiogram  Bone densitometry screening  Colorectal cancer screening  Diabetes screening  Glaucoma screening  Nutrition counseling   Advanced directives: requested   Subjective:  Catherine Parks is a 78 y.o. female who presents for Medicare Annual Wellness Visit and 3 month follow up.   Patient has been followed by Dr Ashok Cordia for COPD and a 6 mm LLL nodule by CT scans last in Nov 2018 which has remained stable in size since 1st discovered in May 2018. No further follow up recommended for nodule. Follows with pulm annually, has scheduled this week.   Patient has hx/o GERD stable by lifestyle & famotidine.. She recently experienced and episode of chest/jaw pain then a near syncopal episode and was referred to cardiology for evaluation and underwent ECHO and stress test in April 2019 which were both unremarkable.   BMI is Body mass index is 23.66 kg/m., she has been working on diet, not exercising since covid 19 due to pulm conditoins and restrictions, will start home video exercises.  Wt Readings from Last 3 Encounters:  02/22/19 146 lb 9.6 oz (66.5 kg)  11/13/18 146 lb 9.6 oz (66.5 kg)  09/22/18 145 lb 12.8 oz (66.1 kg)  Her blood pressure has been controlled at home, today their BP is BP: 140/82  She does workout. She denies chest pain, shortness of breath, dizziness.   She  is not on cholesterol medication and denies myalgias. Her cholesterol is not at goal. The cholesterol last visit was:   Lab Results  Component Value Date   CHOL 214 (H) 11/13/2018   HDL 87 11/13/2018   LDLCALC 112 (H) 11/13/2018   TRIG 64 11/13/2018   CHOLHDL 2.5 11/13/2018    She has been working on diet and exercise for glucose management, and denies foot ulcerations, increased appetite, nausea, paresthesia of the feet, polydipsia, polyuria, visual disturbances, vomiting and weight loss. Last A1C in the office was:  Lab Results  Component Value Date   HGBA1C 5.5 11/13/2018   Last GFR: Lab Results  Component Value Date   GFRAA 71 11/13/2018   Patient is on Vitamin D supplement and at goal at recent check:    Lab Results  Component Value Date   VD25OH 76 11/13/2018      Medication Review: Current Outpatient Medications on File Prior to Visit  Medication Sig Dispense Refill  . acetaminophen (TYLENOL) 500 MG tablet Take 500 mg by mouth every 6 (six) hours as needed (pain).    Marland Kitchen amLODipine (NORVASC) 10 MG tablet TAKE 1 TABLET BY MOUTH EVERY DAY FOR BLOOD PRESSURE 90 tablet 3  . aspirin EC 81 MG tablet Take 81 mg by mouth daily.      . cholecalciferol (VITAMIN D) 1000 units tablet Take 6,000 Units by mouth daily after lunch.     . Flaxseed, Linseed, (FLAX SEED OIL) 1000 MG CAPS Take 1,000 mg by mouth daily after lunch.     . halobetasol (ULTRAVATE) 0.05 % cream Apply topically 2 (two) times daily. Apply to Eczema Rash 2 x/day 100 g 11  . Magnesium 500 MG TABS Take 500 mg by mouth.    . Naphazoline HCl (CLEAR EYES OP) Place 1 drop into both eyes daily.    . nitroGLYCERIN (NITROSTAT) 0.4 MG SL tablet Place 1 tablet (0.4 mg total) under the tongue every 5 (five) minutes as needed for chest pain. 25 tablet 3  . OVER THE COUNTER MEDICATION Takes OTC eczema cream PRN.    . triamterene-hydrochlorothiazide (MAXZIDE-25) 37.5-25 MG tablet TAKE 1 TABLET BY MOUTH EVERY DAY 90 tablet 1  . Wheat  Dextrin (BENEFIBER PO) Take 1 Dose by mouth daily.    . Calcium Citrate (CAL-CITRATE PO) Take by mouth.     No current facility-administered medications on file prior to visit.     Allergies  Allergen Reactions  . Ace Inhibitors Cough  . Lemon Oil Hives    Reaction to lemons  . Shellfish Allergy Swelling and Hives    Current Problems (verified) Patient Active Problem List   Diagnosis Date Noted  . Former smoker 05/08/2018  . Abnormal glucose 05/08/2018  . Hyperlipidemia, mixed 05/08/2018  . Dyslipidemia 01/28/2018  . Abnormal EKG 12/22/2017  . Aortic atherosclerosis ([place]) 07/10/2017  . ILD (interstitial lung disease) ([place]) 09/27/2016  . Arthritis 08/02/2016  . Chronic seasonal allergic rhinitis 08/02/2016  . COPD, mild ([place]) 06/07/2016  . GERD  01/05/2015  . CKD (chronic kidney disease) stage 3, GFR 30-59 ml/min (HCC) 06/08/2014  . Medication management 06/08/2014  . Vitamin D deficiency 08/19/2013  . Essential hypertension   . HSV-1 (herpes simplex virus 1) infection   . Diverticulosis   . Colon polyps     Screening  Tests Immunization History  Administered Date(s) Administered  . DTaP 07/30/2011  . Influenza, High Dose Seasonal PF 06/07/2016, 06/25/2017, 09/22/2018  . Pneumococcal Conjugate-13 11/22/2015  . Pneumococcal Polysaccharide-23 05/11/2013  . Pneumococcal-Unspecified 01/07/2002  . Td 01/07/2002, 05/07/2012   Preventative care: Last colonoscopy: 2012 Mammogram: 03/2018 DEXA: 03/2016 L fem T-1.3 CT lung 07/2017 - lung nodules stable, no further follow up recommended  Prior vaccinations: TD or Tdap: 2013  Influenza: 09/2018 Pneumococcal: 2003, 2014 Prevnar13: 2017 Shingles/Zostavax: declines  Names of Other Physician/Practitioners you currently use: 1. [facility] here for primary care 2. [facility], eye doctor, last visit 2019 3. Dr. Desma Mcgregor, dentist, last visit 2020, q74m  Patient Care Team: Unk Pinto, MD as PCP - General (Internal Medicine) Warden Fillers, MD as Consulting Physician (Optometry) Croitoru, Dani Gobble, MD as Consulting Physician (Cardiology) Sable Feil, MD as Consulting Physician (Gastroenterology) Mickle Plumb, MD (Gynecology) Druscilla Brownie, MD as Consulting Physician (Dermatology) Volanda Napoleon, MD as Consulting Physician (Oncology) Rozetta Nunnery, MD as Consulting Physician (Otolaryngology)  SURGICAL HISTORY She  has a past surgical history that includes Eye surgery; Abdominal hysterectomy; Colonoscopy; Direct laryngoscopy (N/A, 10/21/2014); Mass excision (Right, 10/21/2014); and Excision nasal mass (Right, 10/21/2014). FAMILY HISTORY Her family history includes Breast cancer in her maternal grandmother and sister; Cancer in her brother and brother; Heart attack in her father; Hypertension in her mother; Other in her brother; Prostate cancer in her brother; Stroke in her mother. SOCIAL HISTORY She  reports that she quit smoking about 4 years ago. Her smoking use included cigarettes. She started smoking about 62 years ago. She has a 58.00 pack-year smoking history. She has never used smokeless tobacco. She reports that she does not drink alcohol or use drugs.   MEDICARE WELLNESS OBJECTIVES: Physical activity: Current Exercise Habits: The patient does not participate in regular exercise at present, Exercise limited by: respiratory conditions(s) Cardiac risk factors: Cardiac Risk Factors include: advanced age (>53men, >89 women);hypertension;sedentary lifestyle;smoking/ tobacco exposure Depression/mood screen:   Depression screen [facility] 2/9 02/22/2019  Decreased Interest 0  Down, Depressed, Hopeless 0  PHQ - 2 Score 0    ADLs:  In your present state of health, do you have any difficulty performing the following activities: 02/22/2019 11/13/2018  Hearing? N N  Vision? N N  Difficulty concentrating or making decisions? N N  Walking or climbing stairs?  N N  Dressing or bathing? N N  Doing errands, shopping? N N  Some recent data might be hidden     Cognitive Testing  Alert? Yes  Normal Appearance?Yes  Oriented to person? Yes  Place? Yes   Time? Yes  Recall of three objects?  Yes  Can perform simple calculations? Yes  Displays appropriate judgment?Yes  Can read the correct time from a watch face?Yes  EOL planning: Does Patient Have a Medical Advance Directive?: Yes Type of Advance Directive: Healthcare Power of Attorney, Living will Does patient want to make changes to medical advance directive?: No - Patient declined Copy of [place] in Chart?: No - copy requested  Review of Systems  Constitutional: Negative for malaise/fatigue and weight loss.  HENT: Negative for hearing loss and tinnitus.   Eyes: Negative for blurred vision and double vision.  Respiratory: Negative for cough, sputum production, shortness of breath and wheezing.   Cardiovascular: Negative for chest pain, palpitations, orthopnea, claudication, leg swelling and PND.  Gastrointestinal: Negative for abdominal pain, blood in stool, constipation, diarrhea, heartburn, melena,  nausea and vomiting.  Genitourinary: Negative.   Musculoskeletal: Negative for falls, joint pain and myalgias.  Skin: Negative for rash.  Neurological: Negative for dizziness, tingling, sensory change, weakness and headaches.  Endo/Heme/Allergies: Negative for polydipsia.  Psychiatric/Behavioral: Negative.  Negative for depression, memory loss, substance abuse and suicidal ideas. The patient is not nervous/anxious and does not have insomnia.   All other systems reviewed and are negative.    Objective:     Today's Vitals   02/22/19 1109  BP: 140/82  Pulse: 80  Temp: 97.7 F (36.5 C)  SpO2: 99%  Weight: 146 lb 9.6 oz (66.5 kg)  Height: 5\' 6"  (1.676 m)   Body mass index is 23.66 kg/m.  General appearance: alert, no distress, WD/WN, female HEENT: normocephalic,  sclerae anicteric, TMs pearly, nares patent, no discharge or erythema, pharynx normal Oral cavity: MMM, no lesions Neck: supple, no lymphadenopathy, no thyromegaly, no masses Heart: RRR, normal S1, S2, no murmurs Lungs: CTA bilaterally, no wheezes, rhonchi, or rales Abdomen: +bs, soft, non tender, non distended, no masses, no hepatomegaly, no splenomegaly Musculoskeletal: nontender, no swelling, no obvious deformity Extremities: no edema, no cyanosis, no clubbing Pulses: 2+ symmetric, upper and lower extremities, normal cap refill Neurological: alert, oriented x 3, CN2-12 intact, strength normal upper extremities and lower extremities, sensation normal throughout, DTRs 2+ throughout, no cerebellar signs, gait normal Psychiatric: normal affect, behavior normal, pleasant   Medicare Attestation I have personally reviewed: The patient's medical and social history Their use of alcohol, tobacco or illicit drugs Their current medications and supplements The patient's functional ability including ADLs,fall risks, home safety risks, cognitive, and hearing and visual impairment Diet and physical activities Evidence for depression or mood disorders  The patient's weight, height, BMI, and visual acuity have been recorded in the chart.  I have made referrals, counseling, and provided education to the patient based on review of the above and I have provided the patient with a written personalized care plan for preventive services.     Izora Ribas, NP   02/22/2019

## 2019-02-22 ENCOUNTER — Other Ambulatory Visit: Payer: Self-pay

## 2019-02-22 ENCOUNTER — Encounter: Payer: Self-pay | Admitting: Adult Health

## 2019-02-22 ENCOUNTER — Ambulatory Visit: Payer: Medicare Other | Admitting: Adult Health

## 2019-02-22 VITALS — BP 140/82 | HR 80 | Temp 97.7°F | Ht 66.0 in | Wt 146.6 lb

## 2019-02-22 DIAGNOSIS — J849 Interstitial pulmonary disease, unspecified: Secondary | ICD-10-CM

## 2019-02-22 DIAGNOSIS — I10 Essential (primary) hypertension: Secondary | ICD-10-CM | POA: Diagnosis not present

## 2019-02-22 DIAGNOSIS — R6889 Other general symptoms and signs: Secondary | ICD-10-CM

## 2019-02-22 DIAGNOSIS — I700 Atherosclerosis of aorta: Secondary | ICD-10-CM

## 2019-02-22 DIAGNOSIS — M8580 Other specified disorders of bone density and structure, unspecified site: Secondary | ICD-10-CM

## 2019-02-22 DIAGNOSIS — B009 Herpesviral infection, unspecified: Secondary | ICD-10-CM

## 2019-02-22 DIAGNOSIS — N183 Chronic kidney disease, stage 3 unspecified: Secondary | ICD-10-CM

## 2019-02-22 DIAGNOSIS — E559 Vitamin D deficiency, unspecified: Secondary | ICD-10-CM

## 2019-02-22 DIAGNOSIS — R7309 Other abnormal glucose: Secondary | ICD-10-CM

## 2019-02-22 DIAGNOSIS — Z79899 Other long term (current) drug therapy: Secondary | ICD-10-CM

## 2019-02-22 DIAGNOSIS — M1990 Unspecified osteoarthritis, unspecified site: Secondary | ICD-10-CM

## 2019-02-22 DIAGNOSIS — Z0000 Encounter for general adult medical examination without abnormal findings: Secondary | ICD-10-CM

## 2019-02-22 DIAGNOSIS — K5790 Diverticulosis of intestine, part unspecified, without perforation or abscess without bleeding: Secondary | ICD-10-CM

## 2019-02-22 DIAGNOSIS — J449 Chronic obstructive pulmonary disease, unspecified: Secondary | ICD-10-CM

## 2019-02-22 DIAGNOSIS — Z0001 Encounter for general adult medical examination with abnormal findings: Secondary | ICD-10-CM

## 2019-02-22 DIAGNOSIS — E782 Mixed hyperlipidemia: Secondary | ICD-10-CM

## 2019-02-22 DIAGNOSIS — K219 Gastro-esophageal reflux disease without esophagitis: Secondary | ICD-10-CM

## 2019-02-22 DIAGNOSIS — R9431 Abnormal electrocardiogram [ECG] [EKG]: Secondary | ICD-10-CM

## 2019-02-22 DIAGNOSIS — Z87891 Personal history of nicotine dependence: Secondary | ICD-10-CM

## 2019-02-22 DIAGNOSIS — R911 Solitary pulmonary nodule: Secondary | ICD-10-CM

## 2019-02-22 DIAGNOSIS — J302 Other seasonal allergic rhinitis: Secondary | ICD-10-CM

## 2019-02-22 DIAGNOSIS — K635 Polyp of colon: Secondary | ICD-10-CM

## 2019-02-22 MED ORDER — LOSARTAN POTASSIUM 50 MG PO TABS: ORAL_TABLET | ORAL | 1 refills | Status: DC

## 2019-02-22 MED ORDER — FAMOTIDINE 20 MG PO TABS: 20.0000 mg | ORAL_TABLET | Freq: Every day | ORAL | 1 refills | Status: DC

## 2019-02-22 NOTE — Patient Instructions (Addendum)
  Catherine Parks , Thank you for taking time to come for your Medicare Wellness Visit. I appreciate your ongoing commitment to your health goals. Please review the following plan we discussed and let me know if I can assist you in the future.   These are the goals we discussed: Goals    . Blood Pressure < 140/90    . Exercise 150 min/wk Moderate Activity       This is a list of the screening recommended for you and due dates:  Health Maintenance  Topic Date Due  . Mammogram  04/09/2019  . Flu Shot  04/17/2019  . Tetanus Vaccine  05/07/2022  . DEXA scan (bone density measurement)  Completed  . Pneumonia vaccines  Completed    Please monitor your blood pressure, as we get older our body can not respond to a low blood pressure as well as it did when we were younger, for this reason we want a bit higher of a blood pressure as you get older to avoid dizziness and fatigue which can lead to falls. [place] call if your blood pressure is consistently above 150/90.   Take 50 mg of losartan if blood pressure is <120/80   Know what a healthy weight is for you (roughly BMI <25) and aim to maintain this  Aim for 7+ servings of fruits and vegetables daily  65-80+ fluid ounces of water or unsweet tea for healthy kidneys  Limit to max 1 drink of alcohol per day; avoid smoking/tobacco  Limit animal fats in diet for cholesterol and heart health - choose grass fed whenever available  Avoid highly processed foods, and foods high in saturated/trans fats  Aim for low stress - take time to unwind and care for your mental health  Aim for 150 min of moderate intensity exercise weekly for heart health, and weights twice weekly for bone health  Aim for 7-9 hours of sleep daily

## 2019-02-23 ENCOUNTER — Other Ambulatory Visit (HOSPITAL_COMMUNITY)
Admission: RE | Admit: 2019-02-23 | Discharge: 2019-02-23 | Disposition: A | Discharge status: Home / Self Care | Payer: Medicare Other | Source: Ambulatory Visit | Attending: Internal Medicine | Admitting: Internal Medicine

## 2019-02-23 DIAGNOSIS — Z1159 Encounter for screening for other viral diseases: Secondary | ICD-10-CM | POA: Insufficient documentation

## 2019-02-23 DIAGNOSIS — Z01812 Encounter for preprocedural laboratory examination: Secondary | ICD-10-CM | POA: Diagnosis present

## 2019-02-23 LAB — CBC WITH DIFFERENTIAL/PLATELET
Absolute Monocytes: 610 cells/uL (ref 200–950)
Basophils Absolute: 20 cells/uL (ref 0–200)
Basophils Relative: 0.4 %
Eosinophils Absolute: 250 cells/uL (ref 15–500)
Eosinophils Relative: 5 %
HCT: 39.2 % (ref 35.0–45.0)
Hemoglobin: 13.1 g/dL (ref 11.7–15.5)
Lymphs Abs: 1435 cells/uL (ref 850–3900)
MCH: 29.2 pg (ref 27.0–33.0)
MCHC: 33.4 g/dL (ref 32.0–36.0)
MCV: 87.5 fL (ref 80.0–100.0)
MPV: 9.1 fL (ref 7.5–12.5)
Monocytes Relative: 12.2 %
Neutro Abs: 2685 cells/uL (ref 1500–7800)
Neutrophils Relative %: 53.7 %
Platelets: 367 10*3/uL (ref 140–400)
RBC: 4.48 10*6/uL (ref 3.80–5.10)
RDW: 13.8 % (ref 11.0–15.0)
Total Lymphocyte: 28.7 %
WBC: 5 10*3/uL (ref 3.8–10.8)

## 2019-02-23 LAB — COMPLETE METABOLIC PANEL WITH GFR
AG Ratio: 1.2 (calc) (ref 1.0–2.5)
ALT: 10 U/L (ref 6–29)
AST: 12 U/L (ref 10–35)
Albumin: 4.1 g/dL (ref 3.6–5.1)
Alkaline phosphatase (APISO): 67 U/L (ref 37–153)
BUN: 17 mg/dL (ref 7–25)
CO2: 25 mmol/L (ref 20–32)
Calcium: 10.5 mg/dL — ABNORMAL HIGH (ref 8.6–10.4)
Chloride: 104 mmol/L (ref 98–110)
Creat: 0.87 mg/dL (ref 0.60–0.93)
GFR, Est African American: 74 mL/min/{1.73_m2} (ref >=60)
GFR, Est Non African American: 64 mL/min/{1.73_m2} (ref >=60)
Globulin: 3.3 g/dL (calc) (ref 1.9–3.7)
Glucose, Bld: 89 mg/dL (ref 65–99)
Potassium: 3.6 mmol/L (ref 3.5–5.3)
Sodium: 138 mmol/L (ref 135–146)
Total Bilirubin: 0.4 mg/dL (ref 0.2–1.2)
Total Protein: 7.4 g/dL (ref 6.1–8.1)

## 2019-02-23 LAB — LIPID PANEL
Cholesterol: 207 mg/dL — ABNORMAL HIGH (ref <200)
HDL: 75 mg/dL (ref >=50)
LDL Cholesterol (Calc): 114 mg/dL (calc) — ABNORMAL HIGH
Non-HDL Cholesterol (Calc): 132 mg/dL (calc) — ABNORMAL HIGH (ref <130)
Total CHOL/HDL Ratio: 2.8 (calc) (ref <5.0)
Triglycerides: 85 mg/dL (ref <150)

## 2019-02-23 LAB — MAGNESIUM: Magnesium: 2 mg/dL (ref 1.5–2.5)

## 2019-02-23 LAB — TSH: TSH: 0.82 mIU/L (ref 0.40–4.50)

## 2019-02-24 LAB — NOVEL CORONAVIRUS, NAA (HOSP ORDER, SEND-OUT TO REF LAB; TAT 18-24 HRS): SARS-CoV-2, NAA: NOT DETECTED

## 2019-02-26 ENCOUNTER — Telehealth: Payer: Self-pay | Admitting: *Deleted

## 2019-02-26 ENCOUNTER — Other Ambulatory Visit: Payer: Self-pay

## 2019-02-26 ENCOUNTER — Ambulatory Visit (INDEPENDENT_AMBULATORY_CARE_PROVIDER_SITE_OTHER): Payer: Medicare Other | Admitting: Internal Medicine

## 2019-02-26 ENCOUNTER — Ambulatory Visit: Payer: Medicare Other | Admitting: Nurse Practitioner

## 2019-02-26 ENCOUNTER — Ambulatory Visit (INDEPENDENT_AMBULATORY_CARE_PROVIDER_SITE_OTHER): Payer: Medicare Other

## 2019-02-26 ENCOUNTER — Encounter: Payer: Self-pay | Admitting: Nurse Practitioner

## 2019-02-26 VITALS — BP 128/78 | HR 63 | Temp 98.1°F | Ht 66.0 in | Wt 149.8 lb

## 2019-02-26 DIAGNOSIS — R911 Solitary pulmonary nodule: Secondary | ICD-10-CM

## 2019-02-26 DIAGNOSIS — J849 Interstitial pulmonary disease, unspecified: Secondary | ICD-10-CM

## 2019-02-26 LAB — PULMONARY FUNCTION TEST
DL/VA % pred: 68 %
DL/VA: 2.75 ml/min/mmHg/L
DLCO cor % pred: 57 %
DLCO cor: 11.66 ml/min/mmHg
DLCO unc % pred: 57 %
DLCO unc: 11.55 ml/min/mmHg
FEF 25-75 Post: 1.02 L/sec
FEF 25-75 Pre: 0.91 L/sec
FEF2575-%Change-Post: 12 %
FEF2575-%Pred-Post: 66 %
FEF2575-%Pred-Pre: 59 %
FEV1-%Change-Post: 1 %
FEV1-%Pred-Post: 99 %
FEV1-%Pred-Pre: 98 %
FEV1-Post: 1.79 L
FEV1-Pre: 1.77 L
FEV1FVC-%Change-Post: 1 %
FEV1FVC-%Pred-Pre: 85 %
FEV6-%Change-Post: 0 %
FEV6-%Pred-Post: 120 %
FEV6-%Pred-Pre: 120 %
FEV6-Post: 2.69 L
FEV6-Pre: 2.68 L
FEV6FVC-%Change-Post: 0 %
FEV6FVC-%Pred-Post: 102 %
FEV6FVC-%Pred-Pre: 101 %
FVC-%Change-Post: 0 %
FVC-%Pred-Post: 118 %
FVC-%Pred-Pre: 118 %
FVC-Post: 2.75 L
FVC-Pre: 2.75 L
Post FEV1/FVC ratio: 65 %
Post FEV6/FVC ratio: 98 %
Pre FEV1/FVC ratio: 64 %
Pre FEV6/FVC Ratio: 98 %
RV % pred: 99 %
RV: 2.44 L
TLC % pred: 95 %
TLC: 5.1 L

## 2019-02-26 NOTE — Assessment & Plan Note (Addendum)
ILD and COPD: Patient patient has been stable since last visit on 12/24/2017.  Has not noticed any increase in shortness of breath.  She does try to stay active.  She has not been on any inhalers.  Patient had PFT in office today which is stable from previous PFT.  Pulmonary nodule: Patient has a history of pulmonary nodules on CT scan.  These nodules had shown no change and were consistent with a benign etiology.  Patient's last CT chest was in 2018.   Patient Instructions  Discussed PFT - stable Stay active Will check chest x ray today and call with results   Follow up with Dr. Chase Caller in 6 months and As needed

## 2019-02-26 NOTE — Patient Instructions (Addendum)
Discussed PFT - stable Stay active Will check chest x ray today and call with results   Follow up with Dr. Chase Caller in 6 months and As needed

## 2019-02-26 NOTE — Telephone Encounter (Signed)
ATC patient on mobile no VM mailbox is full.  ATC patient on home number left message for patient to call back Will leave open for patient to call back , no order placed for CT

## 2019-02-26 NOTE — Telephone Encounter (Signed)
-----   Message from Fenton Foy, NP sent at 02/26/2019  3:11 PM EDT ----- Please call to let patient know that she does have a new lung nodule seen on xray. ILD is stable. Please order CT chest wo contrast for evaluation of lung nodule

## 2019-02-26 NOTE — Progress Notes (Signed)
PFT performed today. 

## 2019-02-26 NOTE — Progress Notes (Signed)
_0  ID: Catherine Parks, female    DOB: 1940/12/19, 78 y.o.   MRN: [id number]  Chief Complaint  Patient presents with  . Follow-up    pft    Referring provider: Unk Pinto, MD  HPI  78 year old female former smoker followed for mild COPD with emphysema , lung nodule and ILD   TESTS: High resolution CT chest November 2017 showed ILD changes with  basilar predominant patchy subpleural reticulation, traction bronchiectasis and mild architectural distortion with mild honeycombing at the right base.  CT chest May 2018 showed no change in numerous tiny pulmonary nodules scattered throughout the lung 6 mm dating back to October 2017, stable ILD changes CT chest November 2018 showed stable pulmonary nodules felt to be benign with no specific follow-up noted.  Chronic changes of interstitial fibrosis without change.  PFT 06/13/17: FVC 2.89 L (121%) FEV1 1.91 L (103%) FEV1/FVC 0.66 FEF 25-75 1.00 L (63%) DLCO corrected 46% 12/27/16: FVC 2.81 L (117%) FEV1 1.85 L (99%) FEV1/FVC 0.66 FEF 25-75 0.93 L (58%) DLCO uncorrected 40% 09/27/16: FVC 2.92 L (121%) FEV1 2.01 L (180%) FEV1/FVC 0.69 FEF 25-75 1.20 L (74%) negative bronchodilator response TLC 5.05 L (94%) RV 85% ERV 501% DLCO corrected 50% (Hgb 12.7)  PFT Results Latest Ref Rng & Units 02/26/2019 06/13/2017 12/27/2016 09/27/2016  FVC-Pre L 2.75 2.89 2.81 2.92  FVC-Predicted Pre % 118 121 117 121  FVC-Post L 2.75 - - 2.95  FVC-Predicted Post % 118 - - 122  Pre FEV1/FVC % % 64 66 66 69  Post FEV1/FCV % % 65 - - 70  FEV1-Pre L 1.77 1.91 1.85 2.01  FEV1-Predicted Pre % 98 103 99 108  FEV1-Post L 1.79 - - 2.07  DLCO UNC% % 57 47 40 49  DLCO COR %Predicted % 68 51 48 60  TLC L 5.10 - - 5.05  TLC %  Predicted % 95 - - 94  RV % Predicted % 99 - - 85     LABS 08/02/16 Alpha-1 antitrypsin: MM (153) IgG: 1276 IgA: 658 IgM: 76 IgE: 1652 RAST Panel: D farinae 1.75 / Cockroach 1.66 &other weak positives CRP: 0.1 ESR: 59 ANA: Positive DS DNA Ab: 181 Smith Ab: <0.2 RNP Ab: <0.2 SSA: <0.2 SSB: <0.2 Anti-CCP: <16  OV 02/26/19 - follow up mild COPD, ILD, pulmonary nodules Patient presents for follow-up visit today.  She was last seen by Rexene Edison on 12/24/2017.  She states that this is been a stable interval for her.  She has not seen any increase in her shortness of breath.  She states that she has been more inactive than usual due to current COVID pandemic.  She was going to the [facility] to walk 3 times per week but the [employer] has been closed for the last few months.  Patient tries to remain active inside her house and does lift some light weight dumbbells.  She does continue to get winded with heavy activity.  She is not on any inhalers.  Patient has known ILD changes on CT scan dating back to October 2017.  Previous CT chest showed stable changes.  Patient did have a PFT completed in office today and it is stable from last PFT.  Patient has had previous autoimmune work-up that was suggestive of lupus and was referred to rheumatology.  She is not on no suppression.   Patient has a history of pulmonary nodules on CT scan.  These nodules had shown no change and were consistent with a  benign etiology.  Patient's last CT chest was in 2018.     Allergies  Allergen Reactions  . Ace Inhibitors Cough  . Lemon Oil Hives    Reaction to lemons  . Shellfish Allergy Swelling and Hives    Immunization History  Administered Date(s) Administered  . DTaP 07/30/2011  . Influenza, High Dose Seasonal PF 06/07/2016, 06/25/2017, 09/22/2018  . Pneumococcal Conjugate-13 11/22/2015  . Pneumococcal Polysaccharide-23 05/11/2013  . Pneumococcal-Unspecified 01/07/2002  . Td 01/07/2002,  05/07/2012    Past Medical History:  Diagnosis Date  . Allergic rhinitis   . Arthritis   . Bronchiectasis ([place])   . Colon polyps   . Diverticulosis   . Emphysema of lung ([place])   . GERD (gastroesophageal reflux disease)   . HSV-1 (herpes simplex virus 1) infection   . Hypertension   . Pulmonary nodule 07/10/2017   CT 01/2017, CT 07/2017 - stable on follow up, no further follow up recommended.    Tobacco History: Social History   Tobacco Use  Smoking Status Former Smoker  . Packs/day: 1.00  . Years: 58.00  . Pack years: 58.00  . Types: Cigarettes  . Start date: 10/26/1956  . Quit date: 09/23/2014  . Years since quitting: 4.4  Smokeless Tobacco Never Used  Tobacco Comment   She only took a break for 2 months   Counseling given: Yes Comment: She only took a break for 2 months   Outpatient Encounter Medications as of 02/26/2019  Medication Sig  . acetaminophen (TYLENOL) 500 MG tablet Take 500 mg by mouth every 6 (six) hours as needed (pain).  Marland Kitchen amLODipine (NORVASC) 10 MG tablet TAKE 1 TABLET BY MOUTH EVERY DAY FOR BLOOD PRESSURE  . aspirin EC 81 MG tablet Take 81 mg by mouth daily.    . cholecalciferol (VITAMIN D) 1000 units tablet Take 6,000 Units by mouth daily after lunch.   . famotidine (PEPCID) 20 MG tablet Take 1 tablet (20 mg total) by mouth daily.  . Flaxseed, Linseed, (FLAX SEED OIL) 1000 MG CAPS Take 1,000 mg by mouth daily after lunch.   . halobetasol (ULTRAVATE) 0.05 % cream Apply topically 2 (two) times daily. Apply to Eczema Rash 2 x/day  . Magnesium 500 MG TABS Take 500 mg by mouth.  . Naphazoline HCl (CLEAR EYES OP) Place 1 drop into both eyes daily.  Marland Kitchen OVER THE COUNTER MEDICATION Takes OTC eczema cream PRN.  . triamterene-hydrochlorothiazide (MAXZIDE-25) 37.5-25 MG tablet TAKE 1 TABLET BY MOUTH EVERY DAY  . Wheat Dextrin (BENEFIBER PO) Take 1 Dose by mouth daily.  Marland Kitchen losartan (COZAAR) 50 MG tablet Take 1-2 tabs daily for blood pressure. If <120/80, take 50  mg only. (Patient not taking: Reported on 02/26/2019)  . nitroGLYCERIN (NITROSTAT) 0.4 MG SL tablet Place 1 tablet (0.4 mg total) under the tongue every 5 (five) minutes as needed for chest pain.  . [DISCONTINUED] Calcium Citrate (CAL-CITRATE PO) Take by mouth.   No facility-administered encounter medications on file as of 02/26/2019.      Review of Systems  Review of Systems  Constitutional: Negative.  Negative for chills and fever.  HENT: Negative.   Respiratory: Positive for shortness of breath. Negative for cough and wheezing.   Cardiovascular: Negative.  Negative for chest pain, palpitations and leg swelling.  Gastrointestinal: Negative.   Allergic/Immunologic: Negative.   Neurological: Negative.   Psychiatric/Behavioral: Negative.        Physical Exam  BP 128/78 (BP Location: Left Arm, Cuff Size: Normal)  Pulse 63   Temp 98.1 F (36.7 C) (Oral)   Ht _0  (1.676 m)   Wt 149 lb 12.8 oz (67.9 kg)   SpO2 100%   BMI 24.18 kg/m   Wt Readings from Last 5 Encounters:  02/26/19 149 lb 12.8 oz (67.9 kg)  02/22/19 146 lb 9.6 oz (66.5 kg)  11/13/18 146 lb 9.6 oz (66.5 kg)  09/22/18 145 lb 12.8 oz (66.1 kg)  08/12/18 143 lb 12.8 oz (65.2 kg)     Physical Exam Vitals signs and nursing note reviewed.  Constitutional:      General: She is not in acute distress.    Appearance: She is well-developed.  Cardiovascular:     Rate and Rhythm: Normal rate and regular rhythm.  Pulmonary:     Effort: Pulmonary effort is normal. No respiratory distress.     Breath sounds: Normal breath sounds. No wheezing or rhonchi.  Musculoskeletal:        General: No swelling.  Neurological:     Mental Status: She is alert and oriented to person, place, and time.      Imaging: Dg Chest 2 View  Result Date: 02/26/2019 CLINICAL DATA:  Interstitial lung disease. EXAM: CHEST - 2 VIEW COMPARISON:  CT chest dated July 17, 2017. Chest x-ray dated June 07, 2016. FINDINGS: The heart size  and mediastinal contours are within normal limits. Normal pulmonary vascularity. Emphysematous changes again noted. Mildly increased interstitial markings at the lung bases are similar to prior study. No focal consolidation, pleural effusion, or pneumothorax. New small nodular opacity in the left mid lung, not clearly seen on the lateral view. No acute osseous abnormality. IMPRESSION: 1. New small nodular opacity in left mid lung. Recommend chest CT for further evaluation. 2. Unchanged mild interstitial fibrosis at the lung bases. 3. COPD. Electronically Signed   By: Titus Dubin M.D.   On: 02/26/2019 11:42     Assessment & Plan:   ILD (interstitial lung disease) ([place]) ILD and COPD: Patient patient has been stable since last visit on 12/24/2017.  Has not noticed any increase in shortness of breath.  She does try to stay active.  She has not been on any inhalers.  Patient had PFT in office today which is stable from previous PFT.  Pulmonary nodule: Patient has a history of pulmonary nodules on CT scan.  These nodules had shown no change and were consistent with a benign etiology.  Patient's last CT chest was in 2018.   Patient Instructions  Discussed PFT - stable Stay active Will check chest x ray today and call with results   Follow up with Dr. Chase Caller in 6 months and As needed          Fenton Foy, NP 02/26/2019

## 2019-03-01 NOTE — Telephone Encounter (Signed)
Called and spoke with pt letting her know the results of the cxr and stated to her due to new lung nodule being seen on cxr, TN wants to have a CT performed for evaluation of the lung nodule and pt verbalized understanding.  Order has been placed for the CT. Nothing further needed.

## 2019-03-01 NOTE — Telephone Encounter (Signed)
Pt is returning call.  [phone].

## 2019-03-03 ENCOUNTER — Ambulatory Visit: Payer: Medicare Other | Admitting: Adult Health

## 2019-03-16 ENCOUNTER — Telehealth: Payer: Self-pay | Admitting: *Deleted

## 2019-03-16 NOTE — Telephone Encounter (Signed)

## 2019-03-17 ENCOUNTER — Other Ambulatory Visit: Payer: Self-pay

## 2019-03-17 ENCOUNTER — Ambulatory Visit (INDEPENDENT_AMBULATORY_CARE_PROVIDER_SITE_OTHER)
Admission: RE | Admit: 2019-03-17 | Discharge: 2019-03-17 | Disposition: A | Discharge status: Home / Self Care | Payer: Medicare Other | Source: Ambulatory Visit | Attending: Nurse Practitioner | Admitting: Nurse Practitioner

## 2019-03-17 DIAGNOSIS — R911 Solitary pulmonary nodule: Secondary | ICD-10-CM | POA: Diagnosis not present

## 2019-04-08 ENCOUNTER — Other Ambulatory Visit: Payer: Self-pay | Admitting: Internal Medicine

## 2019-04-23 LAB — HM MAMMOGRAPHY

## 2019-04-23 LAB — HM DEXA SCAN

## 2019-04-25 ENCOUNTER — Other Ambulatory Visit: Payer: Self-pay | Admitting: Adult Health

## 2019-05-04 ENCOUNTER — Encounter: Payer: Self-pay | Admitting: *Deleted

## 2019-05-26 NOTE — Progress Notes (Signed)
No show

## 2019-05-26 NOTE — Patient Instructions (Signed)
Patient no-showed today's appointment.

## 2019-05-27 ENCOUNTER — Ambulatory Visit: Payer: Self-pay | Admitting: Internal Medicine

## 2019-05-27 ENCOUNTER — Ambulatory Visit (INDEPENDENT_AMBULATORY_CARE_PROVIDER_SITE_OTHER): Payer: Self-pay | Admitting: Adult Health Nurse Practitioner

## 2019-05-27 DIAGNOSIS — Z5329 Procedure and treatment not carried out because of patient's decision for other reasons: Secondary | ICD-10-CM

## 2019-05-30 NOTE — Progress Notes (Signed)
   05/25/19 1501  Visit Method  Type of Visit Not In-person (Now show)   Appointment was 05/27/19 - no show

## 2019-06-10 ENCOUNTER — Other Ambulatory Visit: Payer: Self-pay

## 2019-06-10 ENCOUNTER — Encounter: Payer: Self-pay | Admitting: Adult Health Nurse Practitioner

## 2019-06-10 ENCOUNTER — Ambulatory Visit (INDEPENDENT_AMBULATORY_CARE_PROVIDER_SITE_OTHER): Payer: Medicare Other | Admitting: Adult Health Nurse Practitioner

## 2019-06-10 VITALS — BP 118/78 | HR 77 | Temp 97.3°F | Ht 66.0 in | Wt 149.0 lb

## 2019-06-10 DIAGNOSIS — N183 Chronic kidney disease, stage 3 unspecified: Secondary | ICD-10-CM

## 2019-06-10 DIAGNOSIS — J849 Interstitial pulmonary disease, unspecified: Secondary | ICD-10-CM

## 2019-06-10 DIAGNOSIS — K219 Gastro-esophageal reflux disease without esophagitis: Secondary | ICD-10-CM

## 2019-06-10 DIAGNOSIS — E559 Vitamin D deficiency, unspecified: Secondary | ICD-10-CM

## 2019-06-10 DIAGNOSIS — J449 Chronic obstructive pulmonary disease, unspecified: Secondary | ICD-10-CM | POA: Diagnosis not present

## 2019-06-10 DIAGNOSIS — J302 Other seasonal allergic rhinitis: Secondary | ICD-10-CM

## 2019-06-10 DIAGNOSIS — M1990 Unspecified osteoarthritis, unspecified site: Secondary | ICD-10-CM

## 2019-06-10 DIAGNOSIS — G8929 Other chronic pain: Secondary | ICD-10-CM

## 2019-06-10 DIAGNOSIS — I10 Essential (primary) hypertension: Secondary | ICD-10-CM | POA: Diagnosis not present

## 2019-06-10 DIAGNOSIS — M545 Low back pain: Secondary | ICD-10-CM

## 2019-06-10 DIAGNOSIS — Z79899 Other long term (current) drug therapy: Secondary | ICD-10-CM

## 2019-06-10 DIAGNOSIS — R7309 Other abnormal glucose: Secondary | ICD-10-CM

## 2019-06-10 DIAGNOSIS — K5790 Diverticulosis of intestine, part unspecified, without perforation or abscess without bleeding: Secondary | ICD-10-CM

## 2019-06-10 DIAGNOSIS — E782 Mixed hyperlipidemia: Secondary | ICD-10-CM

## 2019-06-10 LAB — COMPLETE METABOLIC PANEL WITH GFR
AG Ratio: 1.3 (calc) (ref 1.0–2.5)
ALT: 10 U/L (ref 6–29)
AST: 16 U/L (ref 10–35)
Albumin: 4.1 g/dL (ref 3.6–5.1)
Alkaline phosphatase (APISO): 66 U/L (ref 37–153)
BUN: 14 mg/dL (ref 7–25)
CO2: 28 mmol/L (ref 20–32)
Calcium: 10.5 mg/dL — ABNORMAL HIGH (ref 8.6–10.4)
Chloride: 102 mmol/L (ref 98–110)
Creat: 0.85 mg/dL (ref 0.60–0.93)
GFR, Est African American: 76 mL/min/{1.73_m2} (ref >=60)
GFR, Est Non African American: 66 mL/min/{1.73_m2} (ref >=60)
Globulin: 3.2 g/dL (calc) (ref 1.9–3.7)
Glucose, Bld: 92 mg/dL (ref 65–99)
Potassium: 3.9 mmol/L (ref 3.5–5.3)
Sodium: 139 mmol/L (ref 135–146)
Total Bilirubin: 0.3 mg/dL (ref 0.2–1.2)
Total Protein: 7.3 g/dL (ref 6.1–8.1)

## 2019-06-10 LAB — CBC WITH DIFFERENTIAL/PLATELET
Absolute Monocytes: 576 cells/uL (ref 200–950)
Basophils Absolute: 19 cells/uL (ref 0–200)
Basophils Relative: 0.4 %
Eosinophils Absolute: 298 cells/uL (ref 15–500)
Eosinophils Relative: 6.2 %
HCT: 39.6 % (ref 35.0–45.0)
Hemoglobin: 13.2 g/dL (ref 11.7–15.5)
Lymphs Abs: 1632 cells/uL (ref 850–3900)
MCH: 29.1 pg (ref 27.0–33.0)
MCHC: 33.3 g/dL (ref 32.0–36.0)
MCV: 87.4 fL (ref 80.0–100.0)
MPV: 9.1 fL (ref 7.5–12.5)
Monocytes Relative: 12 %
Neutro Abs: 2275 cells/uL (ref 1500–7800)
Neutrophils Relative %: 47.4 %
Platelets: 363 10*3/uL (ref 140–400)
RBC: 4.53 10*6/uL (ref 3.80–5.10)
RDW: 13.9 % (ref 11.0–15.0)
Total Lymphocyte: 34 %
WBC: 4.8 10*3/uL (ref 3.8–10.8)

## 2019-06-10 LAB — LIPID PANEL
Cholesterol: 213 mg/dL — ABNORMAL HIGH (ref <200)
HDL: 83 mg/dL (ref >=50)
LDL Cholesterol (Calc): 111 mg/dL (calc) — ABNORMAL HIGH
Non-HDL Cholesterol (Calc): 130 mg/dL (calc) — ABNORMAL HIGH (ref <130)
Total CHOL/HDL Ratio: 2.6 (calc) (ref <5.0)
Triglycerides: 87 mg/dL (ref <150)

## 2019-06-10 MED ORDER — TRAMADOL HCL 50 MG PO TABS: 50.0000 mg | ORAL_TABLET | Freq: Two times a day (BID) | ORAL | 0 refills | Status: DC | PRN

## 2019-06-10 NOTE — Progress Notes (Signed)
3 Month Follow Up   Assessment and Plan:   Catherine Parks was seen today for follow-up.  Diagnoses and all orders for this visit:  Essential hypertension -     CBC with Differential/Platelet -     COMPLETE METABOLIC PANEL WITH GFR  ILD (interstitial lung disease) ([place]) Follows with pulmonology yearly  COPD, mild ([place]) Doing well at this time.    CKD (chronic kidney disease) stage 3, GFR 30-59 ml/min ([facility]) Increase fluids  Avoid NSAIDS Blood pressure control Monitor sugars  Will continue to monitor  Gastroesophageal reflux disease, esophagitis presence not specified Doing well at this time Pepcid 20mg  PRN  Diverticulosis Doing well She takes a stool softener as needed  Abnormal glucose Discussed dietary and exercise modifications  Hyperlipidemia, mixed No medications at this time Discussed dietary and exercise modifications Low fat diet -     Lipid panel  Vitamin D deficiency Continue supplementation Defer check this visit  Chronic seasonal allergic rhinitis Doing well at this time  Arthritis Chronic mid-line low back pain without sciatica Discussed OTC Voltaren gel to low back & hands prn Continue exercise and stretching Rx Tramadol 50mg  one tablet prn for severe pain.  Medication management Continued   Continue diet and meds as discussed. Further disposition pending results of labs. Discussed med's effects and SE's.  Patient agrees with plan of care and opportunity to ask questions/voice concerns. Over 30 minutes of chart review, interview, exam, counseling, and critical decision making was performed.   Future Appointments  Date Time Provider [place]  11/19/2019 10:00 AM Unk Pinto, MD [facility] None    ----------------------------------------------------------------------------------------------------------------------  HPI 78 y.o. female  presents for 3 month follow up on HTN, HLD, Hypothyroidism, history of pre-diabetes, weight and  vitamin D deficiency. She was last seen on 02/22/19 for AWV.  She follows with Dr Ashok Cordia, he has since retired for COPD and 25mmLLL nodule.  Last CT scan July 2020 and has remained stable in size since first discovered in May 2018.  She follows with them yearly.  Patient has GERD and uses lifestyle modifications as well as famotidine.  She reports that she has chronic back pain that has been persistent in her lumbar area as well as thoracic at time. n She has been taking tyleonol 1,000mg  without much help.  She reports that a hot bath will help with the pain.  She reports that she used to go to [facility] so he has not been able to to her exercises as normal.  She reports Sunday was the worst day.  She got up and move around to help with the pain but it persisted and she ended up going back to bed.  BMI is Body mass index is 24.05 kg/m., she has been working on diet and exercise. Wt Readings from Last 3 Encounters:  06/10/19 149 lb (67.6 kg)  02/26/19 149 lb 12.8 oz (67.9 kg)  02/22/19 146 lb 9.6 oz (66.5 kg)     Her blood pressure has been controlled at home, today their BP is BP: 118/78  She does workout.  Since the COVID-19 restrictions she has been doing on-line exercise three days a week.  She denies any cardiac symptoms, chest pains, palpitations, shortness of breath, dizziness or lower extremity edema.     She is not on cholesterol medication and denies myalgias. Her cholesterol is not at goal. The cholesterol last visit was:   Lab Results  Component Value Date   CHOL 207 (H) 02/22/2019   HDL  75 02/22/2019   LDLCALC 114 (H) 02/22/2019   TRIG 85 02/22/2019   CHOLHDL 2.8 02/22/2019    She has been working on diet and exercise for prediabetes, and denies increased appetite, nausea, paresthesia of the feet, polydipsia, polyuria, visual disturbances, vomiting and weight loss. Last A1C in the office was:  Lab Results  Component Value Date   HGBA1C 5.5 11/13/2018   Patient is on  Vitamin D supplement.   Lab Results  Component Value Date   VD25OH 76 11/13/2018       Current Medications:  Current Outpatient Medications on File Prior to Visit  Medication Sig  . acetaminophen (TYLENOL) 500 MG tablet Take 500 mg by mouth every 6 (six) hours as needed (pain).  Marland Kitchen amLODipine (NORVASC) 10 MG tablet Take 1 tablet Daily for BP  . aspirin EC 81 MG tablet Take 81 mg by mouth daily.    . cholecalciferol (VITAMIN D) 1000 units tablet Take 6,000 Units by mouth daily after lunch.   . famotidine (PEPCID) 20 MG tablet Take 1 tablet (20 mg total) by mouth daily.  . Flaxseed, Linseed, (FLAX SEED OIL) 1000 MG CAPS Take 1,000 mg by mouth daily after lunch.   . halobetasol (ULTRAVATE) 0.05 % cream Apply topically 2 (two) times daily. Apply to Eczema Rash 2 x/day  . losartan (COZAAR) 50 MG tablet Take 1-2 tabs daily for blood pressure. If <120/80, take 50 mg only.  . Magnesium 500 MG TABS Take 500 mg by mouth.  . Naphazoline HCl (CLEAR EYES OP) Place 1 drop into both eyes daily.  . nitroGLYCERIN (NITROSTAT) 0.4 MG SL tablet Place 1 tablet (0.4 mg total) under the tongue every 5 (five) minutes as needed for chest pain.  Marland Kitchen OVER THE COUNTER MEDICATION Takes OTC eczema cream PRN.  . triamterene-hydrochlorothiazide (MAXZIDE-25) 37.5-25 MG tablet Take 1 tablet Daily for BP & Fluid  . Wheat Dextrin (BENEFIBER PO) Take 1 Dose by mouth daily.   No current facility-administered medications on file prior to visit.     Allergies:  Allergies  Allergen Reactions  . Ace Inhibitors Cough  . Lemon Oil Hives    Reaction to lemons  . Shellfish Allergy Swelling and Hives     Medical History:  Past Medical History:  Diagnosis Date  . Allergic rhinitis   . Arthritis   . Bronchiectasis ([place])   . Colon polyps   . Diverticulosis   . Emphysema of lung ([place])   . GERD (gastroesophageal reflux disease)   . HSV-1 (herpes simplex virus 1) infection   . Hypertension   . Pulmonary nodule 07/10/2017    CT 01/2017, CT 07/2017 - stable on follow up, no further follow up recommended.    Family history- Reviewed and unchanged   Social history- Reviewed and unchanged   Names of Other Physician/Practitioners you currently use: 1. [facility] here for primary care 2. Eye Exam: Due for 2020 3. Dental Exam Due for 2020  Patient Care Team: Unk Pinto, MD as PCP - General (Internal Medicine) Warden Fillers, MD as Consulting Physician (Optometry) Croitoru, Dani Gobble, MD as Consulting Physician (Cardiology) Sable Feil, MD as Consulting Physician (Gastroenterology) Mickle Plumb, MD (Gynecology) Druscilla Brownie, MD as Consulting Physician (Dermatology) Volanda Napoleon, MD as Consulting Physician (Oncology) Rozetta Nunnery, MD as Consulting Physician (Otolaryngology)   Screening Tests: Immunization History  Administered Date(s) Administered  . DTaP 07/30/2011  . Influenza, High Dose Seasonal PF 06/07/2016, 06/25/2017, 09/22/2018  . Pneumococcal  Conjugate-13 11/22/2015  . Pneumococcal Polysaccharide-23 05/11/2013  . Pneumococcal-Unspecified 01/07/2002  . Td 01/07/2002, 05/07/2012     Vaccinations: TD or Tdap: 2013  Influenza:  Pneumococcal: 2014 Prevnar13: 2017 Shingles: Zostavax/Shingrix: Discussed with patient   Preventative Care: Last colonoscopy: 2012, diverticulosis Last mammogram: 04/2019   DEXA:04/2019, osteopenia   Imaging: Chest X-ray:6/20 EKG: 2/20 ECHO: 4/19    Review of Systems:  Review of Systems  Constitutional: Negative for chills, diaphoresis, fever, malaise/fatigue and weight loss.  HENT: Negative for congestion, ear discharge, ear pain, hearing loss, nosebleeds, sinus pain, sore throat and tinnitus.   Eyes: Negative for blurred vision, double vision, photophobia, pain, discharge and redness.  Respiratory: Negative for cough, hemoptysis, sputum production, shortness of breath, wheezing  and stridor.   Cardiovascular: Negative for chest pain, palpitations, orthopnea, claudication, leg swelling and PND.  Gastrointestinal: Negative for abdominal pain, blood in stool, constipation, diarrhea, heartburn, melena, nausea and vomiting.  Genitourinary: Negative for dysuria, flank pain, frequency, hematuria and urgency.  Musculoskeletal: Positive for back pain. Negative for falls, joint pain, myalgias and neck pain.  Skin: Negative for itching and rash.  Neurological: Negative for dizziness, tingling, tremors, sensory change, speech change, focal weakness, seizures, loss of consciousness, weakness and headaches.  Endo/Heme/Allergies: Negative for environmental allergies and polydipsia. Does not bruise/bleed easily.  Psychiatric/Behavioral: Negative for depression, hallucinations, memory loss, substance abuse and suicidal ideas. The patient is not nervous/anxious and does not have insomnia.       Physical Exam: BP 118/78   Pulse 77   Temp (!) 97.3 F (36.3 C)   Ht 5\' 6"  (1.676 m)   Wt 149 lb (67.6 kg)   SpO2 99%   BMI 24.05 kg/m  Wt Readings from Last 3 Encounters:  06/10/19 149 lb (67.6 kg)  02/26/19 149 lb 12.8 oz (67.9 kg)  02/22/19 146 lb 9.6 oz (66.5 kg)   General Appearance: Well nourished, in no apparent distress. Eyes: PERRLA, EOMs, conjunctiva no swelling or erythema Sinuses: No Frontal/maxillary tenderness ENT/Mouth: Ext aud canals clear, TMs without erythema, bulging. No erythema, swelling, or exudate on post pharynx.  Tonsils not swollen or erythematous. Hearing normal.  Neck: Supple, thyroid normal.  Respiratory: Respiratory effort normal, BS equal bilaterally without rales, rhonchi, wheezing or stridor.  Cardio: RRR with no MRGs. Brisk peripheral pulses without edema.  Abdomen: Soft, + BS.  Non tender, no guarding, rebound, hernias, masses. Lymphatics: Non tender without lymphadenopathy.  Musculoskeletal: Full ROM, 5/5 strength, Normal gait Skin: Warm, dry  without rashes, lesions, ecchymosis.  Neuro: Cranial nerves intact. No cerebellar symptoms.  Psych: Awake and oriented X 3, normal affect, Insight and Judgment appropriate.    Garnet Sierras, NP [facility] 10:20 AM

## 2019-06-10 NOTE — Patient Instructions (Addendum)
Volarten (Diclofenac) topical gel. Apply this to affected areas on your back.  You can get this at any pharmacy.  Use as directed.  We are going to send in Tramadol for you to use as needed.  If you find you need to use this more than three times a week please let our office know.  We will call with labs results in 1-3 days.    Vit D  & Vit C 1,000 mg   are recommended to help protect  against the Covid-19 and other Corona viruses.    Also it's recommended  to take  Zinc 50 mg  to help  protect against the Covid-19   and best place to get  is also on [email or web address]  and don't pay more than 6-8 cents /pill !  ================================ Coronavirus (COVID-19) Are you at risk?  Are you at risk for the Coronavirus (COVID-19)?  To be considered HIGH RISK for Coronavirus (COVID-19), you have to meet the following criteria:  . Traveled to [place], [place], [place], [place] or [place]; or in the [place] to [place], [place], [place]  . or [place]; and have fever, cough, and shortness of breath within the last 2 weeks of travel OR . Been in close contact with a person diagnosed with COVID-19 within the last 2 weeks and have  . fever, cough,and shortness of breath .  . IF YOU DO NOT MEET THESE CRITERIA, YOU ARE CONSIDERED LOW RISK FOR COVID-19.  What to do if you are HIGH RISK for COVID-19?  Marland Kitchen If you are having a medical emergency, call 911. . Seek medical care right away. Before you go to a doctor's office, urgent care or emergency department, .  call ahead and tell them about your recent travel, contact with someone diagnosed with COVID-19  .  and your symptoms.  . You should receive instructions from your physician's office regarding next steps of care.  . When you arrive at healthcare provider, tell the healthcare staff immediately you have returned from  . visiting [place], [place], [place], [place] or [place]; or traveled in the [place] to [place], [place],  . [place] or [place] in the last two weeks or you have been in close contact with a person diagnosed with  . COVID-19 in the last 2 weeks.   . Tell the health care staff about your symptoms: fever, cough and shortness of breath. . After you have been seen by a medical provider, you will be either: o Tested for (COVID-19) and discharged home on quarantine except to seek medical care if  o symptoms worsen, and asked to  - Stay home and avoid contact with others until you get your results (4-5 days)  - Avoid travel on public transportation if possible (such as bus, train, or airplane) or o Sent to the Emergency Department by EMS for evaluation, COVID-19 testing  and  o possible admission depending on your condition and test results.  What to do if you are LOW RISK for COVID-19?  Reduce your risk of any infection by using the same precautions used for avoiding the common cold or flu:  Marland Kitchen Wash your hands often with soap and warm water for at least 20 seconds.  If soap and water are not readily available,  . use an alcohol-based hand sanitizer with at least 60% alcohol.  . If coughing or sneezing, cover your mouth and nose by coughing or sneezing into the elbow  areas of your shirt or coat, .  into a tissue or into your sleeve (not your hands). . Avoid shaking hands with others and consider head nods or verbal greetings only. . Avoid touching your eyes, nose, or mouth with unwashed hands.  . Avoid close contact with people who are sick. . Avoid places or events with large numbers of people in one location, like concerts or sporting events. . Carefully consider travel plans you have or are making. . If you are planning any travel outside or inside the [place], visit the CDC's Travelers' Health webpage for the latest health notices. . If you have some symptoms but not all symptoms, continue to monitor at home and seek medical attention  . if your symptoms worsen. . If you are having a  medical emergency, call 911.   . >>>>>>>>>>>>>>>>>>>>>>>>>>>>>>>>> . We Do NOT Approve of  [facility], [employer]  Our Patients  To Do Home Visits & We Do NOT Approve of [employer] > > > > > > > > > > > > > > > > > > > > > > > > > > > > > > > > > > > > > > >  Preventive Care for Adults  A healthy lifestyle and preventive care can promote health and wellness. Preventive health guidelines for women include the following key practices.  A routine yearly physical is a good way to check with your health care provider about your health and preventive screening. It is a chance to share any concerns and updates on your health and to receive a thorough exam.  Visit your dentist for a routine exam and preventive care every 6 months. Brush your teeth twice a day and floss once a day. Good oral hygiene prevents tooth decay and gum disease.  The frequency of eye exams is based on your age, health, family medical history, use of contact lenses, and other factors. Follow your health care provider's recommendations for frequency of eye exams.  Eat a healthy diet. Foods like vegetables, fruits, whole grains, low-fat dairy products, and lean protein foods contain the nutrients you need without too many calories. Decrease your intake of foods high in solid fats, added sugars, and salt. Eat the right amount of calories for you. Get information about a proper diet from your health care provider, if necessary.  Regular physical exercise is one of the most important things you can do for your health. Most adults should get at least 150 minutes of moderate-intensity exercise (any activity that increases your heart rate and causes you to sweat) each week. In addition, most adults need muscle-strengthening exercises on 2 or more days a week.  Maintain a healthy weight. The body mass index (BMI) is a screening tool to identify possible weight problems. It provides an estimate of body fat  based on height and weight. Your health care provider can find your BMI and can help you achieve or maintain a healthy weight. For adults 20 years and older:  A BMI below 18.5 is considered underweight.  A BMI of 18.5 to 24.9 is normal.  A BMI of 25 to 29.9 is considered overweight.  A BMI of 30 and above is considered obese.  Maintain normal blood lipids and cholesterol levels by exercising and minimizing your intake of saturated fat. Eat a balanced diet with plenty of fruit and vegetables. If your lipid or cholesterol levels are high, you are over 50, or you are at high risk for  heart disease, you may need your cholesterol levels checked more frequently. Ongoing high lipid and cholesterol levels should be treated with medicines if diet and exercise are not working.  If you smoke, find out from your health care provider how to quit. If you do not use tobacco, do not start.  Lung cancer screening is recommended for adults aged 10-80 years who are at high risk for developing lung cancer because of a history of smoking. A yearly low-dose CT scan of the lungs is recommended for people who have at least a 30-pack-year history of smoking and are a current smoker or have quit within the past 15 years. A pack year of smoking is smoking an average of 1 pack of cigarettes a day for 1 year (for example: 1 pack a day for 30 years or 2 packs a day for 15 years). Yearly screening should continue until the smoker has stopped smoking for at least 15 years. Yearly screening should be stopped for people who develop a health problem that would prevent them from having lung cancer treatment.  Avoid use of street drugs. Do not share needles with anyone. Ask for help if you need support or instructions about stopping the use of drugs.  High blood pressure causes heart disease and increases the risk of stroke.  Ongoing high blood pressure should be treated with medicines if weight loss and exercise do not work.  If  you are 76-103 years old, ask your health care provider if you should take aspirin to prevent strokes.  Diabetes screening involves taking a blood sample to check your fasting blood sugar level. This should be done once every 3 years, after age 9, if you are within normal weight and without risk factors for diabetes. Testing should be considered at a younger age or be carried out more frequently if you are overweight and have at least 1 risk factor for diabetes.  Breast cancer screening is essential preventive care for women. You should practice "breast self-awareness." This means understanding the normal appearance and feel of your breasts and may include breast self-examination. Any changes detected, no matter how small, should be reported to a health care provider. Women in their 29s and 30s should have a clinical breast exam (CBE) by a health care provider as part of a regular health exam every 1 to 3 years. After age 64, women should have a CBE every year. Starting at age 44, women should consider having a mammogram (breast X-ray test) every year. Women who have a family history of breast cancer should talk to their health care provider about genetic screening. Women at a high risk of breast cancer should talk to their health care providers about having an MRI and a mammogram every year.  Breast cancer gene (BRCA)-related cancer risk assessment is recommended for women who have family members with BRCA-related cancers. BRCA-related cancers include breast, ovarian, tubal, and peritoneal cancers. Having family members with these cancers may be associated with an increased risk for harmful changes (mutations) in the breast cancer genes BRCA1 and BRCA2. Results of the assessment will determine the need for genetic counseling and BRCA1 and BRCA2 testing.  Routine pelvic exams to screen for cancer are no longer recommended for nonpregnant women who are considered low risk for cancer of the pelvic organs  (ovaries, uterus, and vagina) and who do not have symptoms. Ask your health care provider if a screening pelvic exam is right for you.  If you have had past treatment for  cervical cancer or a condition that could lead to cancer, you need Pap tests and screening for cancer for at least 20 years after your treatment. If Pap tests have been discontinued, your risk factors (such as having a new sexual partner) need to be reassessed to determine if screening should be resumed. Some women have medical problems that increase the chance of getting cervical cancer. In these cases, your health care provider may recommend more frequent screening and Pap tests.    Colorectal cancer can be detected and often prevented. Most routine colorectal cancer screening begins at the age of 59 years and continues through age 4 years. However, your health care provider may recommend screening at an earlier age if you have risk factors for colon cancer. On a yearly basis, your health care provider may provide home test kits to check for hidden blood in the stool. Use of a small camera at the end of a tube, to directly examine the colon (sigmoidoscopy or colonoscopy), can detect the earliest forms of colorectal cancer. Talk to your health care provider about this at age 23, when routine screening begins.  Direct exam of the colon should be repeated every 5-10 years through age 38 years, unless early forms of pre-cancerous polyps or small growths are found.  Osteoporosis is a disease in which the bones lose minerals and strength with aging. This can result in serious bone fractures or breaks. The risk of osteoporosis can be identified using a bone density scan. Women ages 65 years and over and women at risk for fractures or osteoporosis should discuss screening with their health care providers. Ask your health care provider whether you should take a calcium supplement or vitamin D to reduce the rate of osteoporosis.  Menopause can  be associated with physical symptoms and risks. Hormone replacement therapy is available to decrease symptoms and risks. You should talk to your health care provider about whether hormone replacement therapy is right for you.  Use sunscreen. Apply sunscreen liberally and repeatedly throughout the day. You should seek shade when your shadow is shorter than you. Protect yourself by wearing long sleeves, pants, a wide-brimmed hat, and sunglasses year round, whenever you are outdoors.  Once a month, do a whole body skin exam, using a mirror to look at the skin on your back. Tell your health care provider of new moles, moles that have irregular borders, moles that are larger than a pencil eraser, or moles that have changed in shape or color.  Stay current with required vaccines (immunizations).  Influenza vaccine. All adults should be immunized every year.  Tetanus, diphtheria, and acellular pertussis (Td, Tdap) vaccine. Pregnant women should receive 1 dose of Tdap vaccine during each pregnancy. The dose should be obtained regardless of the length of time since the last dose. Immunization is preferred during the 27th-36th week of gestation. An adult who has not previously received Tdap or who does not know her vaccine status should receive 1 dose of Tdap. This initial dose should be followed by tetanus and diphtheria toxoids (Td) booster doses every 10 years. Adults with an unknown or incomplete history of completing a 3-dose immunization series with Td-containing vaccines should begin or complete a primary immunization series including a Tdap dose. Adults should receive a Td booster every 10 years.    Zoster vaccine. One dose is recommended for adults aged 48 years or older unless certain conditions are present.    Pneumococcal 13-valent conjugate (PCV13) vaccine. When indicated, a person who  is uncertain of her immunization history and has no record of immunization should receive the PCV13 vaccine. An  adult aged 1 years or older who has certain medical conditions and has not been previously immunized should receive 1 dose of PCV13 vaccine. This PCV13 should be followed with a dose of pneumococcal polysaccharide (PPSV23) vaccine. The PPSV23 vaccine dose should be obtained at least 1 or more year(s) after the dose of PCV13 vaccine. An adult aged 45 years or older who has certain medical conditions and previously received 1 or more doses of PPSV23 vaccine should receive 1 dose of PCV13. The PCV13 vaccine dose should be obtained 1 or more years after the last PPSV23 vaccine dose.    Pneumococcal polysaccharide (PPSV23) vaccine. When PCV13 is also indicated, PCV13 should be obtained first. All adults aged 79 years and older should be immunized. An adult younger than age 67 years who has certain medical conditions should be immunized. Any person who resides in a nursing home or long-term care facility should be immunized. An adult smoker should be immunized. People with an immunocompromised condition and certain other conditions should receive both PCV13 and PPSV23 vaccines. People with human immunodeficiency virus (HIV) infection should be immunized as soon as possible after diagnosis. Immunization during chemotherapy or radiation therapy should be avoided. Routine use of PPSV23 vaccine is not recommended for American Indians, [place] Natives, or people younger than 65 years unless there are medical conditions that require PPSV23 vaccine. When indicated, people who have unknown immunization and have no record of immunization should receive PPSV23 vaccine. One-time revaccination 5 years after the first dose of PPSV23 is recommended for people aged 19-64 years who have chronic kidney failure, nephrotic syndrome, asplenia, or immunocompromised conditions. People who received 1-2 doses of PPSV23 before age 89 years should receive another dose of PPSV23 vaccine at age 7 years or later if at least 5 years have passed  since the previous dose. Doses of PPSV23 are not needed for people immunized with PPSV23 at or after age 70 years.   Preventive Services / Frequency  Ages 69 years and over  Blood pressure check.  Lipid and cholesterol check.  Lung cancer screening. / Every year if you are aged 74-80 years and have a 30-pack-year history of smoking and currently smoke or have quit within the past 15 years. Yearly screening is stopped once you have quit smoking for at least 15 years or develop a health problem that would prevent you from having lung cancer treatment.  Clinical breast exam.** / Every year after age 5 years.   BRCA-related cancer risk assessment.** / For women who have family members with a BRCA-related cancer (breast, ovarian, tubal, or peritoneal cancers).  Mammogram.** / Every year beginning at age 4 years and continuing for as long as you are in good health. Consult with your health care provider.  Pap test.** / Every 3 years starting at age 7 years through age 37 or 51 years with 3 consecutive normal Pap tests. Testing can be stopped between 65 and 70 years with 3 consecutive normal Pap tests and no abnormal Pap or HPV tests in the past 10 years.  Fecal occult blood test (FOBT) of stool. / Every year beginning at age 61 years and continuing until age 24 years. You may not need to do this test if you get a colonoscopy every 10 years.  Flexible sigmoidoscopy or colonoscopy.** / Every 5 years for a flexible sigmoidoscopy or every 10 years for a colonoscopy  beginning at age 38 years and continuing until age 7 years.  Hepatitis C blood test.** / For all people born from [birth year] through 1965 and any individual with known risks for hepatitis C.  Osteoporosis screening.** / A one-time screening for women ages 31 years and over and women at risk for fractures or osteoporosis.  Skin self-exam. / Monthly.  Influenza vaccine. / Every year.  Tetanus, diphtheria, and acellular pertussis  (Tdap/Td) vaccine.** / 1 dose of Td every 10 years.  Zoster vaccine.** / 1 dose for adults aged 91 years or older.  Pneumococcal 13-valent conjugate (PCV13) vaccine.** / Consult your health care provider.  Pneumococcal polysaccharide (PPSV23) vaccine.** / 1 dose for all adults aged 82 years and older. Screening for abdominal aortic aneurysm (AAA)  by ultrasound is recommended for people who have history of high blood pressure or who are current or former smokers. ++++++++++++++++++++ Recommend Adult Low Dose Aspirin or  coated  Aspirin 81 mg daily  To reduce risk of Colon Cancer 40 %,  Skin Cancer 26 % ,  Melanoma 46%  and  Pancreatic cancer 60% ++++++++++++++++++++ Vitamin D goal  is between 70-100.  Please make sure that you are taking your Vitamin D as directed.  It is very important as a natural anti-inflammatory  helping hair, skin, and nails, as well as reducing stroke and heart attack risk.  It helps your bones and helps with mood. It also decreases numerous cancer risks so please take it as directed.  Low Vit D is associated with a 200-300% higher risk for CANCER  and 200-300% higher risk for HEART   ATTACK  &  STROKE.   .....................................Marland Kitchen It is also associated with higher death rate at younger ages,  autoimmune diseases like Rheumatoid arthritis, Lupus, Multiple Sclerosis.    Also many other serious conditions, like depression, Alzheimer's Dementia, infertility, muscle aches, fatigue, fibromyalgia - just to name a few. ++++++++++++++++++ Recommend the book "The END of DIETING" by Dr Excell Seltzer  & the book "The END of DIABETES " by Dr Excell Seltzer At [email or web address] - get book & Audio CD's    Being diabetic has a  300% increased risk for heart attack, stroke, cancer, and alzheimer- type vascular dementia. It is very important that you work harder with diet by avoiding all foods that are white. Avoid white rice (brown & wild rice is OK), white potatoes  (sweetpotatoes in moderation is OK), White bread or wheat bread or anything made out of white flour like bagels, donuts, rolls, buns, biscuits, cakes, pastries, cookies, pizza crust, and pasta (made from white flour & egg whites) - vegetarian pasta or spinach or wheat pasta is OK. Multigrain breads like Arnold's or [employer], or multigrain sandwich thins or flatbreads.  Diet, exercise and weight loss can reverse and cure diabetes in the early stages.  Diet, exercise and weight loss is very important in the control and prevention of complications of diabetes which affects every system in your body, ie. Brain - dementia/stroke, eyes - glaucoma/blindness, heart - heart attack/heart failure, kidneys - dialysis, stomach - gastric paralysis, intestines - malabsorption, nerves - severe painful neuritis, circulation - gangrene & loss of a leg(s), and finally cancer and Alzheimers.    I recommend avoid fried & greasy foods,  sweets/candy, white rice (brown or wild rice or Quinoa is OK), white potatoes (sweet potatoes are OK) - anything made from white flour - bagels, doughnuts, rolls, buns, biscuits,white and wheat breads, pizza crust and traditional pasta  made of white flour & egg white(vegetarian pasta or spinach or wheat pasta is OK).  Multi-grain bread is OK - like multi-grain flat bread or sandwich thins. Avoid alcohol in excess. Exercise is also important.    Eat all the vegetables you want - avoid meat, especially red meat and dairy - especially cheese.  Cheese is the most concentrated form of trans-fats which is the worst thing to clog up our arteries. Veggie cheese is OK which can be found in the fresh produce section at [employer] or [employer] or [employer]  +++++++++++++++++++ DASH Eating Plan  DASH stands for "Dietary Approaches to Stop Hypertension."   The DASH eating plan is a healthy eating plan that has been shown to reduce high blood pressure (hypertension). Additional health benefits  may include reducing the risk of type 2 diabetes mellitus, heart disease, and stroke. The DASH eating plan may also help with weight loss. WHAT DO I NEED TO KNOW ABOUT THE DASH EATING PLAN? For the DASH eating plan, you will follow these general guidelines:  Choose foods with a percent daily value for sodium of less than 5% (as listed on the food label).  Use salt-free seasonings or herbs instead of table salt or sea salt.  Check with your health care provider or pharmacist before using salt substitutes.  Eat lower-sodium products, often labeled as "lower sodium" or "no salt added."  Eat fresh foods.  Eat more vegetables, fruits, and low-fat dairy products.  Choose whole grains. Look for the word "whole" as the first word in the ingredient list.  Choose fish   Limit sweets, desserts, sugars, and sugary drinks.  Choose heart-healthy fats.  Eat veggie cheese   Eat more home-cooked food and less restaurant, buffet, and fast food.  Limit fried foods.  Cook foods using methods other than frying.  Limit canned vegetables. If you do use them, rinse them well to decrease the sodium.  When eating at a restaurant, ask that your food be prepared with less salt, or no salt if possible.                      WHAT FOODS CAN I EAT? Read Dr Fara Olden Fuhrman's books on The End of Dieting & The End of Diabetes  Grains Whole grain or whole wheat bread. Brown rice. Whole grain or whole wheat pasta. Quinoa, bulgur, and whole grain cereals. Low-sodium cereals. Corn or whole wheat flour tortillas. Whole grain cornbread. Whole grain crackers. Low-sodium crackers.  Vegetables Fresh or frozen vegetables (raw, steamed, roasted, or grilled). Low-sodium or reduced-sodium tomato and vegetable juices. Low-sodium or reduced-sodium tomato sauce and paste. Low-sodium or reduced-sodium canned vegetables.   Fruits All fresh, canned (in natural juice), or frozen fruits.  Protein Products  All fish and  seafood.  Dried beans, peas, or lentils. Unsalted nuts and seeds. Unsalted canned beans.  Dairy Low-fat dairy products, such as skim or 1% milk, 2% or reduced-fat cheeses, low-fat ricotta or cottage cheese, or plain low-fat yogurt. Low-sodium or reduced-sodium cheeses.  Fats and Oils Tub margarines without trans fats. Light or reduced-fat mayonnaise and salad dressings (reduced sodium). Avocado. Safflower, olive, or canola oils. Natural peanut or almond butter.  Other Unsalted popcorn and pretzels. The items listed above may not be a complete list of recommended foods or beverages. Contact your dietitian for more options.  +++++++++++++++  WHAT FOODS ARE NOT RECOMMENDED? Grains/ White flour or wheat flour White bread. White pasta. White rice. Refined cornbread. Bagels  and croissants. Crackers that contain trans fat.  Vegetables  Creamed or fried vegetables. Vegetables in a . Regular canned vegetables. Regular canned tomato sauce and paste. Regular tomato and vegetable juices.  Fruits Dried fruits. Canned fruit in light or heavy syrup. Fruit juice.  Meat and Other Protein Products Meat in general - RED meat & White meat.  Fatty cuts of meat. Ribs, chicken wings, all processed meats as bacon, sausage, bologna, salami, fatback, hot dogs, bratwurst and packaged luncheon meats.  Dairy Whole or 2% milk, cream, half-and-half, and cream cheese. Whole-fat or sweetened yogurt. Full-fat cheeses or blue cheese. Non-dairy creamers and whipped toppings. Processed cheese, cheese spreads, or cheese curds.  Condiments Onion and garlic salt, seasoned salt, table salt, and sea salt. Canned and packaged gravies. Worcestershire sauce. Tartar sauce. Barbecue sauce. Teriyaki sauce. Soy sauce, including reduced sodium. Steak sauce. Fish sauce. Oyster sauce. Cocktail sauce. Horseradish. Ketchup and mustard. Meat flavorings and tenderizers. Bouillon cubes. Hot sauce. Tabasco sauce. Marinades. Taco seasonings.  Relishes.  Fats and Oils Butter, stick margarine, lard, shortening and bacon fat. Coconut, palm kernel, or palm oils. Regular salad dressings.  Pickles and olives. Salted popcorn and pretzels.  The items listed above may not be a complete list of foods and beverages to avoid.

## 2019-11-18 ENCOUNTER — Encounter: Payer: Self-pay | Admitting: Internal Medicine

## 2019-11-18 NOTE — Progress Notes (Signed)
Annual Screening/Preventative Visit & Comprehensive Evaluation &  Examination     This very nice 79 y.o.  WBF presents for a Screening /Preventative Visit & comprehensive evaluation and management of multiple medical co-morbidities.  Patient has been followed for HTN, HLD, Prediabetes  and Vitamin D Deficiency. Patient's GERD is having occasional break-thru dyspepsia. Patient is also followed by Dr Ashok Cordia for a 6 mm LLL nodule and ILD.       HTN predates since 52. Patient's BP has been controlled at home and patient denies any cardiac symptoms as chest pain, palpitations, shortness of breath, dizziness or ankle swelling. Today's BP is at goal - 122/76.      Patient's hyperlipidemia is not controlled with diet and patient is reticent to take meds for Cholesterol.  Last lipids were not at goal:  Lab Results  Component Value Date   CHOL 213 (H) 06/10/2019   HDL 83 06/10/2019   LDLCALC 111 (H) 06/10/2019   TRIG 87 06/10/2019   CHOLHDL 2.6 06/10/2019       Patient has hx/o prediabetes  (A1c 6.0% / 2014)  and patient denies reactive hypoglycemic symptoms, visual blurring, diabetic polys or paresthesias. Last A1c was Normal & at goal:  Lab Results  Component Value Date   HGBA1C 5.5 11/13/2018       Finally, patient has history of Vitamin D Deficiency ("18" / 2008)  and last Vitamin D was at goal:  Lab Results  Component Value Date   VD25OH 76 11/13/2018    Current Outpatient Medications on File Prior to Visit  Medication Sig  . acetaminophen (TYLENOL) 500 MG tablet Take 500 mg by mouth every 6 (six) hours as needed (pain).  Marland Kitchen amLODipine (NORVASC) 10 MG tablet Take 1 tablet Daily for BP  . aspirin EC 81 MG tablet Take 81 mg by mouth daily.    . cholecalciferol (VITAMIN D) 1000 units tablet Take 6,000 Units by mouth daily after lunch.   . famotidine (PEPCID) 20 MG tablet Take 1 tablet (20 mg total) by mouth daily.  . Flaxseed, Linseed, (FLAX SEED OIL) 1000 MG CAPS Take 1,000 mg by  mouth daily after lunch.   . halobetasol (ULTRAVATE) 0.05 % cream Apply topically 2 (two) times daily. Apply to Eczema Rash 2 x/day  . Magnesium 500 MG TABS Take 500 mg by mouth.  . Naphazoline HCl (CLEAR EYES OP) Place 1 drop into both eyes daily.  Marland Kitchen OVER THE COUNTER MEDICATION Takes OTC eczema cream PRN.  . traMADol (ULTRAM) 50 MG tablet Take 1 tablet (50 mg total) by mouth every 12 (twelve) hours as needed for moderate pain. 1 pill twice daily as needed for pain  . triamterene-hydrochlorothiazide (MAXZIDE-25) 37.5-25 MG tablet Take 1 tablet Daily for BP & Fluid  . Wheat Dextrin (BENEFIBER PO) Take 1 Dose by mouth daily.  . nitroGLYCERIN (NITROSTAT) 0.4 MG SL tablet Place 1 tablet (0.4 mg total) under the tongue every 5 (five) minutes as needed for chest pain.   No current facility-administered medications on file prior to visit.   Allergies  Allergen Reactions  . Ace Inhibitors Cough  . Lemon Oil Hives    Reaction to lemons  . Shellfish Allergy Swelling and Hives   Past Medical History:  Diagnosis Date  . Allergic rhinitis   . Arthritis   . Bronchiectasis ([place])   . Colon polyps   . Diverticulosis   . Emphysema of lung ([place])   . GERD (gastroesophageal reflux disease)   . HSV-1 (  herpes simplex virus 1) infection   . Hypertension   . Pulmonary nodule 07/10/2017   CT 01/2017, CT 07/2017 - stable on follow up, no further follow up recommended.   Health Maintenance  Topic Date Due  . MAMMOGRAM  04/22/2020  . TETANUS/TDAP  05/07/2022  . INFLUENZA VACCINE  Completed  . DEXA SCAN  Completed  . PNA vac Low Risk Adult  Completed   Immunization History  Administered Date(s) Administered  . DTaP 07/30/2011  . Influenza, High Dose Seasonal PF 06/07/2016, 06/25/2017, 09/22/2018, 07/19/2019  . Pneumococcal Conjugate-13 11/22/2015  . Pneumococcal Polysaccharide-23 05/11/2013  . Pneumococcal-Unspecified 01/07/2002  . Td 01/07/2002, 05/07/2012    Last Colon - 02.06.2012 - Dr  Sharlett Iles - Severe Sigmoid Divertic's - Recc 10 year f/u due Feb 2022  Last MGM - 08.07.2020  Last Dexa - 08.07.2020 Osteopenia (T-1.4) Recc 2 yr f/u due Aug 2022  Past Surgical History:  Procedure Laterality Date  . ABDOMINAL HYSTERECTOMY    . COLONOSCOPY    . DIRECT LARYNGOSCOPY N/A 10/21/2014   Procedure: DIRECT LARYNGOSCOPY;  Surgeon: Rozetta Nunnery, MD;  Location: [place];  Service: ENT;  Laterality: N/A;  . EXCISION NASAL MASS Right 10/21/2014   Procedure: EXCISION NASOPHARYNGEAL MASS;  Surgeon: Rozetta Nunnery, MD;  Location: [place];  Service: ENT;  Laterality: Right;  . EYE SURGERY     cataracts  . MASS EXCISION Right 10/21/2014   Procedure: RIGHT NECK NODE BIOPSY;  Surgeon: Rozetta Nunnery, MD;  Location: [place];  Service: ENT;  Laterality: Right;   Family History  Problem Relation Age of Onset  . Heart attack Father   . Hypertension Mother   . Stroke Mother   . Breast cancer Sister   . Cancer Brother   . Prostate cancer Brother   . Breast cancer Maternal Grandmother   . Other Brother        Heart Issues  . Cancer Brother   . Lung disease Neg Hx   . Rheumatologic disease Neg Hx    Social History   Tobacco Use  . Smoking status: Former Smoker    Packs/day: 1.00    Years: 58.00    Pack years: 58.00    Types: Cigarettes    Start date: 10/26/1956    Quit date: 09/23/2014    Years since quitting: 5.1  . Smokeless tobacco: Never Used  . Tobacco comment: She only took a break for 2 months  Substance Use Topics  . Alcohol use: No    Alcohol/week: 0.0 standard drinks  . Drug use: No    ROS Constitutional: Denies fever, chills, weight loss/gain, headaches, insomnia,  night sweats, and change in appetite. Does c/o fatigue. Eyes: Denies redness, blurred vision, diplopia, discharge, itchy, watery eyes.  ENT: Denies discharge, congestion, post nasal drip, epistaxis, sore throat, earache, hearing loss,  dental pain, Tinnitus, Vertigo, Sinus pain, snoring.  Cardio: Denies chest pain, palpitations, irregular heartbeat, syncope, dyspnea, diaphoresis, orthopnea, PND, claudication, edema Respiratory: denies cough, dyspnea, DOE, pleurisy, hoarseness, laryngitis, wheezing.  Gastrointestinal: Denies dysphagia, heartburn, reflux, water brash, pain, cramps, nausea, vomiting, bloating, diarrhea, constipation, hematemesis, melena, hematochezia, jaundice, hemorrhoids Genitourinary: Denies dysuria, frequency, urgency, nocturia, hesitancy, discharge, hematuria, flank pain Breast: Breast lumps, nipple discharge, bleeding.  Musculoskeletal: Denies arthralgia, myalgia, stiffness, Jt. Swelling, pain, limp, and strain/sprain. Denies falls. Skin: Denies puritis, rash, hives, warts, acne, eczema, changing in skin lesion Neuro: No weakness, tremor, incoordination, spasms, paresthesia, pain Psychiatric: Denies confusion,  memory loss, sensory loss. Denies Depression. Endocrine: Denies change in weight, skin, hair change, nocturia, and paresthesia, diabetic polys, visual blurring, hyper / hypo glycemic episodes.  Heme/Lymph: No excessive bleeding, bruising, enlarged lymph nodes.  Physical Exam  BP 122/76   Pulse 64   Temp (!) 97.4 F (36.3 C)   Resp 16   Ht 5\' 6"  (1.676 m)   Wt 147 lb 12.8 oz (67 kg)   BMI 23.86 kg/m   General Appearance: Well nourished, well groomed and in no apparent distress.  Eyes: PERRLA, EOMs, conjunctiva no swelling or erythema, normal fundi and vessels. Sinuses: No frontal/maxillary tenderness ENT/Mouth: EACs patent / TMs  nl. Nares clear without erythema, swelling, mucoid exudates. Oral hygiene is good. No erythema, swelling, or exudate. Tongue normal, non-obstructing. Tonsils not swollen or erythematous. Hearing normal.  Neck: Supple, thyroid not palpable. No bruits, nodes or JVD. Respiratory: Respiratory effort normal.  BS equal and clear bilateral without rales, rhonci, wheezing or  stridor. Cardio: Heart sounds are normal with regular rate and rhythm and no murmurs, rubs or gallops. Peripheral pulses are normal and equal bilaterally without edema. No aortic or femoral bruits. Chest: symmetric with normal excursions and percussion. Breasts: Symmetric, without lumps, nipple discharge, retractions, or fibrocystic changes.  Abdomen: Flat, soft with bowel sounds active. Nontender, no guarding, rebound, hernias, masses, or organomegaly.  Lymphatics: Non tender without lymphadenopathy.  Musculoskeletal: Full ROM all peripheral extremities, joint stability, 5/5 strength, and normal gait. Skin: Warm and dry without rashes, lesions, cyanosis, clubbing or  ecchymosis.  Neuro: Cranial nerves intact, reflexes equal bilaterally. Normal muscle tone, no cerebellar symptoms. Sensation intact.  Pysch: Alert and oriented X 3, normal affect, Insight and Judgment appropriate.   Assessment and Plan  1. Annual Preventative Screening Examination  2. Essential hypertension  - EKG 12-Lead - Urinalysis, Routine w reflex microscopic - CBC with Differential/Platelet - COMPLETE METABOLIC PANEL WITH GFR - Magnesium - TSH  3. Hyperlipidemia, mixed  - EKG 12-Lead - TSH  4. Abnormal glucose  - EKG 12-Lead - Hemoglobin A1c - Insulin, random  5. Vitamin D deficiency  - VITAMIN D 25 Hydroxy  6. Screening for colorectal cancer  - POC Hemoccult Bld/Stl  7. Screening for ischemic heart disease  - EKG 12-Lead  8. FHx: heart disease  - EKG 12-Lead  9. Former smoker  - EKG 12-Lead  10. Aortic atherosclerosis (HCC)  - EKG 12-Lead  11. Medication management  - Urinalysis, Routine w reflex microscopic - POC Hemoccult Bld/Stl  - CBC with Differential/Platelet - COMPLETE METABOLIC PANEL WITH GFR - Magnesium - Lipid panel - TSH - Hemoglobin A1c - Insulin, random - VITAMIN D 25 Hydroxy         Patient was counseled in prudent diet to achieve/maintain BMI less than 25 for  weight control, BP monitoring, regular exercise and medications. Discussed med's effects and SE's.  Increase Pepcid from 20 to 40 mg /day for break-thru. Screening labs and tests as requested with regular follow-up as recommended. Over 40 minutes of exam, counseling, chart review and high complex critical decision making was performed.   Kirtland Bouchard, MD

## 2019-11-18 NOTE — Patient Instructions (Signed)

## 2019-11-19 ENCOUNTER — Ambulatory Visit: Payer: Medicare PPO | Admitting: Internal Medicine

## 2019-11-19 ENCOUNTER — Other Ambulatory Visit: Payer: Self-pay

## 2019-11-19 VITALS — BP 122/76 | HR 64 | Temp 97.4°F | Resp 16 | Ht 66.0 in | Wt 147.8 lb

## 2019-11-19 DIAGNOSIS — I10 Essential (primary) hypertension: Secondary | ICD-10-CM | POA: Diagnosis not present

## 2019-11-19 DIAGNOSIS — E782 Mixed hyperlipidemia: Secondary | ICD-10-CM | POA: Diagnosis not present

## 2019-11-19 DIAGNOSIS — Z0001 Encounter for general adult medical examination with abnormal findings: Secondary | ICD-10-CM

## 2019-11-19 DIAGNOSIS — Z87891 Personal history of nicotine dependence: Secondary | ICD-10-CM

## 2019-11-19 DIAGNOSIS — Z0000 Encounter for general adult medical examination without abnormal findings: Secondary | ICD-10-CM

## 2019-11-19 DIAGNOSIS — I700 Atherosclerosis of aorta: Secondary | ICD-10-CM

## 2019-11-19 DIAGNOSIS — Z79899 Other long term (current) drug therapy: Secondary | ICD-10-CM | POA: Diagnosis not present

## 2019-11-19 DIAGNOSIS — Z136 Encounter for screening for cardiovascular disorders: Secondary | ICD-10-CM

## 2019-11-19 DIAGNOSIS — Z1211 Encounter for screening for malignant neoplasm of colon: Secondary | ICD-10-CM

## 2019-11-19 DIAGNOSIS — Z8249 Family history of ischemic heart disease and other diseases of the circulatory system: Secondary | ICD-10-CM | POA: Diagnosis not present

## 2019-11-19 DIAGNOSIS — E559 Vitamin D deficiency, unspecified: Secondary | ICD-10-CM | POA: Diagnosis not present

## 2019-11-19 DIAGNOSIS — R7309 Other abnormal glucose: Secondary | ICD-10-CM | POA: Diagnosis not present

## 2019-11-19 MED ORDER — FAMOTIDINE 40 MG PO TABS: ORAL_TABLET | ORAL | 3 refills | Status: DC

## 2019-11-22 LAB — COMPLETE METABOLIC PANEL WITH GFR
AG Ratio: 1.2 (calc) (ref 1.0–2.5)
ALT: 9 U/L (ref 6–29)
AST: 13 U/L (ref 10–35)
Albumin: 3.9 g/dL (ref 3.6–5.1)
Alkaline phosphatase (APISO): 65 U/L (ref 37–153)
BUN: 14 mg/dL (ref 7–25)
CO2: 27 mmol/L (ref 20–32)
Calcium: 10.6 mg/dL — ABNORMAL HIGH (ref 8.6–10.4)
Chloride: 103 mmol/L (ref 98–110)
Creat: 0.83 mg/dL (ref 0.60–0.93)
GFR, Est African American: 78 mL/min/{1.73_m2} (ref >=60)
GFR, Est Non African American: 67 mL/min/{1.73_m2} (ref >=60)
Globulin: 3.3 g/dL (calc) (ref 1.9–3.7)
Glucose, Bld: 99 mg/dL (ref 65–99)
Potassium: 4 mmol/L (ref 3.5–5.3)
Sodium: 139 mmol/L (ref 135–146)
Total Bilirubin: 0.2 mg/dL (ref 0.2–1.2)
Total Protein: 7.2 g/dL (ref 6.1–8.1)

## 2019-11-22 LAB — CBC WITH DIFFERENTIAL/PLATELET
Absolute Monocytes: 500 cells/uL (ref 200–950)
Basophils Absolute: 39 cells/uL (ref 0–200)
Basophils Relative: 0.8 %
Eosinophils Absolute: 348 cells/uL (ref 15–500)
Eosinophils Relative: 7.1 %
HCT: 38.7 % (ref 35.0–45.0)
Hemoglobin: 12.9 g/dL (ref 11.7–15.5)
Lymphs Abs: 1573 cells/uL (ref 850–3900)
MCH: 29.3 pg (ref 27.0–33.0)
MCHC: 33.3 g/dL (ref 32.0–36.0)
MCV: 87.8 fL (ref 80.0–100.0)
MPV: 9.2 fL (ref 7.5–12.5)
Monocytes Relative: 10.2 %
Neutro Abs: 2440 cells/uL (ref 1500–7800)
Neutrophils Relative %: 49.8 %
Platelets: 427 10*3/uL — ABNORMAL HIGH (ref 140–400)
RBC: 4.41 10*6/uL (ref 3.80–5.10)
RDW: 13.7 % (ref 11.0–15.0)
Total Lymphocyte: 32.1 %
WBC: 4.9 10*3/uL (ref 3.8–10.8)

## 2019-11-22 LAB — LIPID PANEL
Cholesterol: 192 mg/dL (ref <200)
HDL: 65 mg/dL (ref >=50)
LDL Cholesterol (Calc): 113 mg/dL (calc) — ABNORMAL HIGH
Non-HDL Cholesterol (Calc): 127 mg/dL (calc) (ref <130)
Total CHOL/HDL Ratio: 3 (calc) (ref <5.0)
Triglycerides: 54 mg/dL (ref <150)

## 2019-11-22 LAB — HEMOGLOBIN A1C
Hgb A1c MFr Bld: 5.8 % of total Hgb — ABNORMAL HIGH (ref <5.7)
Mean Plasma Glucose: 120 (calc)
eAG (mmol/L): 6.6 (calc)

## 2019-11-22 LAB — URINALYSIS, ROUTINE W REFLEX MICROSCOPIC
Bilirubin Urine: NEGATIVE
Glucose, UA: NEGATIVE
Hgb urine dipstick: NEGATIVE
Ketones, ur: NEGATIVE
Leukocytes,Ua: NEGATIVE
Nitrite: NEGATIVE
Protein, ur: NEGATIVE
Specific Gravity, Urine: 1.013 (ref 1.001–1.03)
pH: >=8.5 — AB (ref 5.0–8.0)

## 2019-11-22 LAB — VITAMIN D 25 HYDROXY (VIT D DEFICIENCY, FRACTURES): Vit D, 25-Hydroxy: 87 ng/mL (ref 30–100)

## 2019-11-22 LAB — MAGNESIUM: Magnesium: 2.3 mg/dL (ref 1.5–2.5)

## 2019-11-22 LAB — TSH: TSH: 1.2 mIU/L (ref 0.40–4.50)

## 2019-11-22 LAB — INSULIN, RANDOM: Insulin: 8.1 u[IU]/mL

## 2020-01-19 ENCOUNTER — Telehealth: Payer: Self-pay | Admitting: *Deleted

## 2020-01-19 ENCOUNTER — Other Ambulatory Visit: Payer: Self-pay

## 2020-01-19 ENCOUNTER — Encounter: Payer: Self-pay | Admitting: Internal Medicine

## 2020-01-19 ENCOUNTER — Ambulatory Visit: Payer: Medicare PPO | Admitting: Internal Medicine

## 2020-01-19 VITALS — BP 130/70 | HR 80 | Temp 97.2°F | Ht 64.0 in | Wt 147.2 lb

## 2020-01-19 DIAGNOSIS — J849 Interstitial pulmonary disease, unspecified: Secondary | ICD-10-CM

## 2020-01-19 DIAGNOSIS — J439 Emphysema, unspecified: Secondary | ICD-10-CM

## 2020-01-19 DIAGNOSIS — R768 Other specified abnormal immunological findings in serum: Secondary | ICD-10-CM | POA: Diagnosis not present

## 2020-01-19 NOTE — Telephone Encounter (Signed)
Patient requested the name of the rheumatologist she saw in the past. She was informed she saw Dr Berna Bue on 01/31/201/.

## 2020-01-19 NOTE — Patient Instructions (Addendum)
ICD-10-CM   1. ILD (interstitial lung disease) ([place])  J84.9   2. Pulmonary emphysema, unspecified emphysema type ([place])  J43.9   3. Ds DNA antibody positive  R76.8   4. ANA positive  R76.8     Clinically minimally symptomatic Pulmonary function test seems variable over time with stability of emphysema and pulmonary fibrosis on CT scan of the chest as of July 2020  Plan -Get high-resolution CT chest supine and prone in 3 months [last in July 2020] -Get spirometry and DLCO in 3 months [last in June 2020] -Get echocardiogram in 3 months [last in 2019] -Sign release to get the office records from the rheumatologist  Follow-up -Return in 3 months but after completing the above.-   -Consider right heart catheterization if echocardiogram shows evidence of RV dysfunction

## 2020-01-19 NOTE — Progress Notes (Signed)
'@Patient'  ID: Catherine Parks, female    DOB: Sep 17, 1940, 79 y.o.   MRN: [id number]  Chief Complaint  Patient presents with  . Follow-up    pft    Referring provider: Unk Pinto, MD  HPI  79 year old female former smoker followed for mild COPD with emphysema , lung nodule and ILD   TESTS: High resolution CT chest November 2017 showed ILD changes with  basilar predominant patchy subpleural reticulation, traction bronchiectasis and mild architectural distortion with mild honeycombing at the right base.  CT chest May 2018 showed no change in numerous tiny pulmonary nodules scattered throughout the lung 6 mm dating back to October 2017, stable ILD changes CT chest November 2018 showed stable pulmonary nodules felt to be benign with no specific follow-up noted.  Chronic changes of interstitial fibrosis without change.  PFT 06/13/17: FVC 2.89 L (121%) FEV1 1.91 L (103%) FEV1/FVC 0.66 FEF 25-75 1.00 L (63%) DLCO corrected 46% 12/27/16: FVC 2.81 L (117%) FEV1 1.85 L (99%) FEV1/FVC 0.66 FEF 25-75 0.93 L (58%) DLCO uncorrected 40% 09/27/16: FVC 2.92 L (121%) FEV1 2.01 L (180%) FEV1/FVC 0.69 FEF 25-75 1.20 L (74%) negative bronchodilator response TLC 5.05 L (94%) RV 85% ERV 501% DLCO corrected 50% (Hgb 12.7)  PFT Results Latest Ref Rng & Units 02/26/2019 06/13/2017 12/27/2016 09/27/2016  FVC-Pre L 2.75 2.89 2.81 2.92  FVC-Predicted Pre % 118 121 117 121  FVC-Post L 2.75 - - 2.95  FVC-Predicted Post % 118 - - 122  Pre FEV1/FVC % % 64 66 66 69  Post FEV1/FCV % % 65 - - 70  FEV1-Pre L 1.77 1.91 1.85 2.01  FEV1-Predicted Pre % 98 103 99 108  FEV1-Post L 1.79 - - 2.07  DLCO UNC% % 57 47 40 49  DLCO COR %Predicted % 68 51 48 60  TLC L 5.10 - - 5.05    TLC % Predicted % 95 - - 94  RV % Predicted % 99 - - 85     LABS 08/02/16 Alpha-1 antitrypsin: MM (153) IgG: 1276 IgA: 658 IgM: 76 IgE: 1652 RAST Panel: D farinae 1.75 / Cockroach 1.66 &other weak positives CRP: 0.1 ESR: 59 ANA: Positive DS DNA Ab: 181 Smith Ab: <0.2 RNP Ab: <0.2 SSA: <0.2 SSB: <0.2 Anti-CCP: <16  OV 02/26/19 - follow up mild COPD, ILD, pulmonary nodules Patient presents for follow-up visit today.  She was last seen by Rexene Edison on 12/24/2017.  She states that this is been a stable interval for her.  She has not seen any increase in her shortness of breath.  She states that she has been more inactive than usual due to current COVID pandemic.  She was going to the [facility] to walk 3 times per week but the [employer] has been closed for the last few months.  Patient tries to remain active inside her house and does lift some light weight dumbbells.  She does continue to get winded with heavy activity.  She is not on any inhalers.  Patient has known ILD changes on CT scan dating back to October 2017.  Previous CT chest showed stable changes.  Patient did have a PFT completed in office today and it is stable from last PFT.  Patient has had previous autoimmune work-up that was suggestive of lupus and was referred to rheumatology.  She is not on no suppression.   Patient has a history of pulmonary nodules on CT scan.  These nodules had shown no change and were  consistent with a benign etiology.  Patient's last CT chest was in 2018.  OV 01/19/2020  Subjective:  Patient ID: Catherine Parks, female , DOB: 09/07/1941 , age 4 y.o. , MRN: [id number] , ADDRESS: [place] [id number]   01/19/2020 -   Chief Complaint  Patient presents with  . Follow-up    no concerns   Follow-up combination emphysema and ILD with pulmonary nodule. ANA positive and double-stranded DNA positive in 2017  HPI Catherine Parks 79 y.o. -returns for follow-up.  I  personally not seen her in few to several years.  She is seen the nurse practitioner in between.  Nurse practitioner notes her above.  Overall she tells me she is doing stable.  She is very minimal symptoms.  Symptom score is documented below.  For positive ANA and double-stranded DNA last year she is did see a rheumatologist.  She does not know who she saw but said she was reassured that she did not have any systemic evidence of connective tissue disease.  She is not taking any inhalers.  She feels that her shortness of breath on exertion is much improved.  She only notices it for stairs but even then it is minimal.  Her most recent test results were reviewed.  She is interested in serial monitoring and ensuring disease is stable.  She is concerned about interstitial lung disease.  She had a cardiac stress test in 2019 that was normal.  Her CT scan of the chest also shows enlarged pulmonary arteries.  She has grade 1 diastolic dysfunction on an echo.   SYMPTOM SCALE - ILD 01/19/2020   O2 use ra  Shortness of Breath 0 -> 5 scale with 5 being worst (score 6 If unable to do)  At rest 0  Simple tasks - showers, clothes change, eating, shaving 0  Household (dishes, doing bed, laundry) 1  Shopping 1  Walking level at own pace 1  Walking up Stairs 2  Total (30-36) Dyspnea Score 5  How bad is your cough? 0  How bad is your fatigue 1  How bad is nausea 0  How bad is vomiting?  0  How bad is diarrhea? 0  How bad is anxiety? 0  How bad is depression 0   Simple office walk 185 feet x  3 laps goal with forehead probe 01/19/2020   O2 used ra  Number laps completed 3  Comments about pace x  Resting Pulse Ox/HR 100% and x/min  Final Pulse Ox/HR 98% and 98/min  Desaturated </= 88% no  Desaturated <= 3% points no  Got Tachycardic >/= 90/min yes  Symptoms at end of test x  Miscellaneous comments x     Results for JOZY, MCPHEARSON (MRN [id number]) as of 01/19/2020 09:28  Ref. Range 09/27/2016 10:23  12/27/2016 08:50 06/13/2017 13:24 02/26/2019 08:48  FVC-Pre Latest Units: L 2.92 2.81 2.89 2.75  FVC-%Pred-Pre Latest Units: % 121 117 121 118  FEV1-Pre Latest Units: L 2.01 1.85 1.91 1.77    Results for VANESSIA, BOKHARI (MRN [id number]) as of 01/19/2020 09:28  Ref. Range 09/27/2016 10:23 12/27/2016 08:50 06/13/2017 13:24 02/26/2019 08:48  DLCO cor Latest Units: ml/min/mmHg 13.63  12.56 - 11.66  DLCO cor % pred Latest Units: % 50  46 57   IMPRESSION: 1. Stable bilateral pulmonary nodules without evidence of a new concerning airspace opacity. 2. Chronic lung changes at the lung bases bilaterally with scarring and associated bronchiectasis. 3. Progressive volume  loss in the right middle lobe favored to be secondary to chronic scarring and atelectasis. 4. Dilated main pulmonary artery which can be seen in patients with elevated PA pressures. 5. Emphysematous changes bilaterally.  Aortic Atherosclerosis (ICD10-I70.0) and Emphysema (ICD10-J43.9).   Electronically Signed   By: Constance Holster M.D.   On: 03/17/2019 20:56  ROS - per HPI   -------------------------------------------------------------------  Study Conclusions - ECHO  - Left ventricle: The cavity size was normal. Systolic function was  normal. The estimated ejection fraction was in the range of 55%  to 60%. Wall motion was normal; there were no regional wall  motion abnormalities. There was an increased relative  contribution of atrial contraction to ventricular filling.  Doppler parameters are consistent with abnormal left ventricular  relaxation (grade 1 diastolic dysfunction).  - Aortic valve: Trileaflet; mildly thickened, mildly calcified  leaflets.  - Mitral valve: There was mild regurgitation.  - Tricuspid valve: There was mild regurgitation    has a past medical history of Allergic rhinitis, Arthritis, Bronchiectasis ([place]), Colon polyps, Diverticulosis, Emphysema of lung ([place]), GERD  (gastroesophageal reflux disease), HSV-1 (herpes simplex virus 1) infection, Hypertension, and Pulmonary nodule (07/10/2017).   reports that she quit smoking about 5 years ago. Her smoking use included cigarettes. She started smoking about 63 years ago. She has a 58.00 pack-year smoking history. She has never used smokeless tobacco.  Past Surgical History:  Procedure Laterality Date  . ABDOMINAL HYSTERECTOMY    . COLONOSCOPY    . DIRECT LARYNGOSCOPY N/A 10/21/2014   Procedure: DIRECT LARYNGOSCOPY;  Surgeon: Rozetta Nunnery, MD;  Location: [facility];  Service: ENT;  Laterality: N/A;  . EXCISION NASAL MASS Right 10/21/2014   Procedure: EXCISION NASOPHARYNGEAL MASS;  Surgeon: Rozetta Nunnery, MD;  Location: [facility];  Service: ENT;  Laterality: Right;  . EYE SURGERY     cataracts  . MASS EXCISION Right 10/21/2014   Procedure: RIGHT NECK NODE BIOPSY;  Surgeon: Rozetta Nunnery, MD;  Location: [facility];  Service: ENT;  Laterality: Right;    Allergies  Allergen Reactions  . Ace Inhibitors Cough  . Lemon Oil Hives    Reaction to lemons  . Shellfish Allergy Swelling and Hives    Immunization History  Administered Date(s) Administered  . DTaP 07/30/2011  . Influenza, High Dose Seasonal PF 06/07/2016, 06/25/2017, 09/22/2018, 07/19/2019  . PFIZER SARS-COV-2 Vaccination 10/05/2019, 10/25/2019  . Pneumococcal Conjugate-13 11/22/2015  . Pneumococcal Polysaccharide-23 05/11/2013  . Pneumococcal-Unspecified 01/07/2002  . Td 01/07/2002, 05/07/2012    Family History  Problem Relation Age of Onset  . Heart attack Father   . Hypertension Mother   . Stroke Mother   . Breast cancer Sister   . Cancer Brother   . Prostate cancer Brother   . Breast cancer Maternal Grandmother   . Other Brother        Heart Issues  . Cancer Brother   . Lung disease Neg Hx   . Rheumatologic disease Neg Hx      Current Outpatient Medications:  .   acetaminophen (TYLENOL) 500 MG tablet, Take 500 mg by mouth every 6 (six) hours as needed (pain)., Disp: , Rfl:  .  amLODipine (NORVASC) 10 MG tablet, Take 1 tablet Daily for BP, Disp: 90 tablet, Rfl: 3 .  Ascorbic Acid (VITAMIN C) 100 MG tablet, Take 100 mg by mouth daily., Disp: , Rfl:  .  aspirin EC 81 MG tablet, Take 81 mg by mouth  daily.  , Disp: , Rfl:  .  cholecalciferol (VITAMIN D) 1000 units tablet, Take 6,000 Units by mouth daily after lunch. , Disp: , Rfl:  .  cloNIDine (CATAPRES) 0.2 MG tablet, 0.2 mg as needed., Disp: , Rfl:  .  famotidine (PEPCID) 40 MG tablet, Take 1 tablet every Day for Indigestion  & Heartburn, Disp: 90 tablet, Rfl: 3 .  Flaxseed, Linseed, (FLAX SEED OIL) 1000 MG CAPS, Take 1,000 mg by mouth daily after lunch. , Disp: , Rfl:  .  halobetasol (ULTRAVATE) 0.05 % cream, Apply topically 2 (two) times daily. Apply to Eczema Rash 2 x/day, Disp: 100 g, Rfl: 11 .  Magnesium 500 MG TABS, Take 500 mg by mouth., Disp: , Rfl:  .  Naphazoline HCl (CLEAR EYES OP), Place 1 drop into both eyes daily., Disp: , Rfl:  .  OVER THE COUNTER MEDICATION, Takes OTC eczema cream PRN., Disp: , Rfl:  .  traMADol (ULTRAM) 50 MG tablet, Take 1 tablet (50 mg total) by mouth every 12 (twelve) hours as needed for moderate pain. 1 pill twice daily as needed for pain, Disp: 60 tablet, Rfl: 0 .  triamterene-hydrochlorothiazide (MAXZIDE-25) 37.5-25 MG tablet, Take 1 tablet Daily for BP & Fluid, Disp: 90 tablet, Rfl: 3 .  Wheat Dextrin (BENEFIBER PO), Take 1 Dose by mouth daily., Disp: , Rfl:  .  zinc gluconate 50 MG tablet, Take 50 mg by mouth daily., Disp: , Rfl:  .  nitroGLYCERIN (NITROSTAT) 0.4 MG SL tablet, Place 1 tablet (0.4 mg total) under the tongue every 5 (five) minutes as needed for chest pain., Disp: 25 tablet, Rfl: 3      Objective:   Vitals:   01/19/20 0919  BP: 130/70  Pulse: 80  Temp: (!) 97.2 F (36.2 C)  TempSrc: Temporal  SpO2: 100%  Weight: 147 lb 3.2 oz (66.8 kg)    Height: '5\' 4"'  (1.626 m)    Estimated body mass index is 25.27 kg/m as calculated from the following:   Height as of this encounter: '5\' 4"'  (1.626 m).   Weight as of this encounter: 147 lb 3.2 oz (66.8 kg).  '@WEIGHTCHANGE' @  [employer]   01/19/20 0919  Weight: 147 lb 3.2 oz (66.8 kg)     Physical Exam  General Appearance:    Alert, cooperative, no distress, appears stated age - yes , Deconditioned looking - no , OBESE  - no, Sitting on Wheelchair -  no  Head:    Normocephalic, without obvious abnormality, atraumatic  Eyes:    PERRL, conjunctiva/corneas clear,  Ears:    Normal TM's and external ear canals, both ears  Nose:   Nares normal, septum midline, mucosa normal, no drainage    or sinus tenderness. OXYGEN ON  - no . Patient is @ ra   Throat:   Lips, mucosa, and tongue normal; teeth and gums normal. Cyanosis on lips - no  Neck:   Supple, symmetrical, trachea midline, no adenopathy;    thyroid:  no enlargement/tenderness/nodules; no carotid   bruit or JVD  Back:     Symmetric, no curvature, ROM normal, no CVA tenderness  Lungs:     Distress - no , Wheeze no, Barrell Chest - no, Purse lip breathing - no, Crackles - yes at base   Chest Wall:    No tenderness or deformity.    Heart:    Regular rate and rhythm, S1 and S2 normal, no rub   or gallop, Murmur - no  Breast Exam:    NOT DONE  Abdomen:     Soft, non-tender, bowel sounds active all four quadrants,    no masses, no organomegaly. Visceral obesity - no  Genitalia:   NOT DONE  Rectal:   NOT DONE  Extremities:   Extremities - normal, Has Cane - no, Clubbing - no, Edema - no  Pulses:   2+ and symmetric all extremities  Skin:   Stigmata of Connective Tissue Disease - no  Lymph nodes:   Cervical, supraclavicular, and axillary nodes normal  Psychiatric:  Neurologic:   Pleasant - yes, Anxious - no, Flat affect - nop  CAm-ICU - neg, Alert and Oriented x 3 - yes, Moves all 4s - yes, Speech - normal, Cognition - intact            Assessment:       ICD-10-CM   1. ILD (interstitial lung disease) ([place])  J84.9   2. Pulmonary emphysema, unspecified emphysema type ([place])  J43.9   3. Ds DNA antibody positive  R76.8   4. ANA positive  R76.8        Plan:     Patient Instructions     ICD-10-CM   1. ILD (interstitial lung disease) ([place])  J84.9   2. Pulmonary emphysema, unspecified emphysema type ([place])  J43.9   3. Ds DNA antibody positive  R76.8   4. ANA positive  R76.8     Clinically minimally symptomatic Pulmonary function test seems variable over time with stability of emphysema and pulmonary fibrosis on CT scan of the chest as of July 2020  Plan -Get high-resolution CT chest supine and prone in 3 months [last in July 2020] -Get spirometry and DLCO in 3 months [last in June 2020] -Get echocardiogram in 3 months [last in 2019] -Sign release to get the office records from the rheumatologist  Follow-up -Return in 3 months but after completing the above.-   -Consider right heart catheterization if echocardiogram shows evidence of RV dysfunction    (Level 04: Estb 30-39 min  visit type: on-site physical face to visit visit spent in total care time and counseling or/and coordination of care by this undersigned MD - Dr Brand Males. This includes one or more of the following on this same day 01/19/2020: pre-charting, chart review, note writing, documentation discussion of test results, diagnostic or treatment recommendations, prognosis, risks and benefits of management options, instructions, education, compliance or risk-factor reduction. It excludes time spent by the [facility] or office staff in the care of the patient . Actual time is 30 min)   SIGNATURE    Dr. Brand Males, M.D., F.C.C.P,  Pulmonary and Critical Care Medicine Staff Physician, [place] Director - Interstitial Lung Disease  Program  Pulmonary [place] at [place], [place], [address]  Pager: [phone], If no answer or between  15:00h - 7:00h: call [phone] Telephone: [phone]  9:54 AM 01/19/2020

## 2020-01-20 NOTE — Telephone Encounter (Signed)
Pt came by office today 5/6 and was given a blank medical release form that she filled out. Nothing further needed.

## 2020-01-20 NOTE — Telephone Encounter (Signed)
Called and spoke with pt in regards to the medical release form that was missing a signature from her. Pt stated she came by the office today 5/6 and was given a blank medical release form that she filled out and signed.  Nothing further needed.

## 2020-02-23 ENCOUNTER — Encounter: Payer: Self-pay | Admitting: Adult Health

## 2020-02-23 NOTE — Progress Notes (Signed)
MEDICARE ANNUAL WELLNESS VISIT AND FOLLOW UP  Assessment:   Diagnoses and all orders for this visit:  Encounter for Medicare annual wellness exam Hep C screen completed WIll check with insurance about shingrix CT lung via pulm  Essential hypertension Continue medication; reduce losartan to 50 mg only if <120/80, otherwise take 100 mg daily  Monitor blood pressure at home; call if consistently over 130/80 Continue DASH diet.   Reminder to go to the ER if any CP, SOB, nausea, dizziness, severe HA, changes vision/speech, left arm numbness and tingling and jaw pain.  Aortic atherosclerosis ([place]) Per CT 2020 Discussed LDL goal <100, statin; she requests defer until next visit, would consider  Control blood pressure, cholesterol, glucose, increase exercise.   ILD (interstitial lung disease) ([place]) Followed by Dr. Ashok Cordia  COPD, mild [facility]) Followed by Dr. Ashok Cordia  Chronic seasonal allergic rhinitis Continue allergy pill  Bronchiectasis without acute exacerbation ([place]) Followed by Dr. Ashok Cordia  Gastroesophageal reflux disease, esophagitis presence not specified Well managed on current medications Discussed diet, avoiding triggers and other lifestyle changes  Diverticulosis Increase fiber and fluids, UTD on colonoscopy  Polyp of colon, unspecified part of colon, unspecified type Colonoscopy up to date  Arthritis Tylenol PRN  CKD (chronic kidney disease) stage 3, GFR 30-59 ml/min (HCC) Increase fluids, avoid NSAIDS, monitor sugars, will monitor CMP/GFR  Vitamin D deficiency Continue supplementation Check vitamin D level  Other abnormal glucose Recent A1Cs at goal Discussed diet/exercise, weight management  Defer A1C; check CMP  Medication management CBC, CMP/GFR, magnesium   HSV-1 (herpes simplex virus 1) infection Antiviral PRN  Dyslipidemia Start low dose medication if remains elevated, declines today  LDL goal <100 Continue low cholesterol diet and exercise.   Check lipid panel.   Osteopenia - get dexa 06/2021, continue Vit D and Ca, weight bearing exercises   Over 40 minutes of exam, counseling, chart review and critical decision making was performed Future Appointments  Date Time Provider [place]  04/17/2020  9:00 AM [facility]-SCREENING [facility] None  04/20/2020 11:00 AM [facility] PFT ROOM [facility] None  06/01/2020 11:00 AM Unk Pinto, MD [facility] None  12/05/2020 10:00 AM Unk Pinto, MD [facility] None     Plan:   During the course of the visit the patient was educated and counseled about appropriate screening and preventive services including:    Pneumococcal vaccine   Prevnar 13  Influenza vaccine  Td vaccine  Screening electrocardiogram  Bone densitometry screening  Colorectal cancer screening  Diabetes screening  Glaucoma screening  Nutrition counseling   Advanced directives: requested   Subjective:  Catherine Parks is a 79 y.o. female who presents for Medicare Annual Wellness Visit and 3 month follow up.   She reports she slipped on a step and slid down 7 steps on her back 3 weeks ago; has had some soreness in her back, started in upper back and shoulders, that is now resolved, now having some generalized R lower back pain (denies point pain). She describes dull pain, not midline, doesn't keep her up at night, worst in the morning when she wakes up up to 7/10, but denies pain at this time. Took some tylenol initially but none in the last week as seems to be improving. Denies radicular pain or numbness/weakness. This was the only fall she had this year, typically very stable.   Patient has been followed by Dr Ashok Cordia for COPD and a 6 mm LLL nodule by CT scans last in Nov 2018 which has remained stable  in size since 1st discovered in May 2018. No further follow up recommended for nodule. Follows with pulm annually, getting low dose screening CT due to 58 pack year smoking hx, quit in 2016, ? PAH,  emphysematous changes, ILD.   Patient has hx/o GERD stable by lifestyle & famotidine.  BMI is Body mass index is 24.75 kg/m., she has been working on diet, not exercising since covid 19 due to pulm conditoins and restrictions, [facility]  Wt Readings from Last 3 Encounters:  02/24/20 144 lb 3.2 oz (65.4 kg)  01/19/20 147 lb 3.2 oz (66.8 kg)  11/19/19 147 lb 12.8 oz (67 kg)   She has aortic atherosclerosis per CT 03/2019 Had stress test and ECHO in 12/2017 following chest/jaw pain episode which were unremarkable -   Her blood pressure has been controlled at home (110-120s/70s), today their BP is BP: 140/82  She does not workout. She denies chest pain, shortness of breath, dizziness.   She is not on cholesterol medication and denies myalgias. Her cholesterol is not at goal. She is doing plant based, no dairy, low fat poultry. The cholesterol last visit was:   Lab Results  Component Value Date   CHOL 192 11/19/2019   HDL 65 11/19/2019   LDLCALC 113 (H) 11/19/2019   TRIG 54 11/19/2019   CHOLHDL 3.0 11/19/2019    She has been working on diet and exercise for glucose management, and denies foot ulcerations, increased appetite, nausea, paresthesia of the feet, polydipsia, polyuria, visual disturbances, vomiting and weight loss. Last A1C in the office was:  Lab Results  Component Value Date   HGBA1C 5.8 (H) 11/19/2019   Last GFR: Lab Results  Component Value Date   GFRAA 78 11/19/2019   Patient is on Vitamin D supplement and at goal at recent check:    Lab Results  Component Value Date   VD25OH 87 11/19/2019      Medication Review: Current Outpatient Medications on File Prior to Visit  Medication Sig Dispense Refill  . acetaminophen (TYLENOL) 500 MG tablet Take 500 mg by mouth every 6 (six) hours as needed (pain).    Marland Kitchen amLODipine (NORVASC) 10 MG tablet Take 1 tablet Daily for BP 90 tablet 3  . Ascorbic Acid (VITAMIN C) 100 MG tablet Take 100 mg by mouth  daily.    Marland Kitchen aspirin EC 81 MG tablet Take 81 mg by mouth daily.      . cholecalciferol (VITAMIN D) 1000 units tablet Take 6,000 Units by mouth daily after lunch.     . cloNIDine (CATAPRES) 0.2 MG tablet 0.2 mg as needed.    . famotidine (PEPCID) 40 MG tablet Take 1 tablet every Day for Indigestion  & Heartburn 90 tablet 3  . Flaxseed, Linseed, (FLAX SEED OIL) 1000 MG CAPS Take 1,000 mg by mouth daily after lunch.     . halobetasol (ULTRAVATE) 0.05 % cream Apply topically 2 (two) times daily. Apply to Eczema Rash 2 x/day 100 g 11  . Magnesium 500 MG TABS Take 500 mg by mouth.    . Naphazoline HCl (CLEAR EYES OP) Place 1 drop into both eyes daily.    . nitroGLYCERIN (NITROSTAT) 0.4 MG SL tablet Place 1 tablet (0.4 mg total) under the tongue every 5 (five) minutes as needed for chest pain. 25 tablet 3  . OVER THE COUNTER MEDICATION Takes OTC eczema cream PRN.    . traMADol (ULTRAM) 50 MG tablet Take 1 tablet (50 mg  total) by mouth every 12 (twelve) hours as needed for moderate pain. 1 pill twice daily as needed for pain 60 tablet 0  . triamterene-hydrochlorothiazide (MAXZIDE-25) 37.5-25 MG tablet Take 1 tablet Daily for BP & Fluid 90 tablet 3  . Wheat Dextrin (BENEFIBER PO) Take 1 Dose by mouth daily.    Marland Kitchen zinc gluconate 50 MG tablet Take 50 mg by mouth daily.     No current facility-administered medications on file prior to visit.    Allergies  Allergen Reactions  . Ace Inhibitors Cough  . Lemon Oil Hives    Reaction to lemons  . Shellfish Allergy Swelling and Hives    Current Problems (verified) Patient Active Problem List   Diagnosis Date Noted  . Former smoker 05/08/2018  . Abnormal glucose 05/08/2018  . Hyperlipidemia, mixed 05/08/2018  . Dyslipidemia 01/28/2018  . Abnormal EKG 12/22/2017  . Aortic atherosclerosis ([place]) 07/10/2017  . ILD (interstitial lung disease) ([place]) 09/27/2016  . Arthritis 08/02/2016  . Chronic seasonal allergic rhinitis 08/02/2016  . COPD, mild ([place])  06/07/2016  . GERD  01/05/2015  . CKD (chronic kidney disease) stage 2, GFR 60-89 ml/min 06/08/2014  . Medication management 06/08/2014  . Vitamin D deficiency 08/19/2013  . Essential hypertension   . HSV-1 (herpes simplex virus 1) infection   . Diverticulosis   . Colon polyps     Screening Tests Immunization History  Administered Date(s) Administered  . DTaP 07/30/2011  . Influenza, High Dose Seasonal PF 06/07/2016, 06/25/2017, 09/22/2018, 07/19/2019  . PFIZER SARS-COV-2 Vaccination 10/05/2019, 10/25/2019  . Pneumococcal Conjugate-13 11/22/2015  . Pneumococcal Polysaccharide-23 05/11/2013  . Pneumococcal-Unspecified 01/07/2002  . Td 01/07/2002, 05/07/2012   Preventative care: Last colonoscopy: 10/2010, diverticulosis, 10 year follow up per Dr. Sharlett Iles Mammogram: 04/2019 DEXA: 03/2016 L fem T-1.3, 04/2019 L fem T -1.4  CT lung 07/2017 - lung nodules stable, no further follow up recommended Former smoker quit 2016, 50+ pack year hx - low dose CT 03/2019, pulm is following  Prior vaccinations: TD or Tdap: 2013  Influenza: 09/2018 Pneumococcal: 2003, 2014 Prevnar13: 2017 Shingles/Zostavax: declines Covid 19: 2/2, 2021, pfizer   Names of Other Physician/Practitioners you currently use: 1. [facility] here for primary care 2. [facility], eye doctor, last visit 2020 3. Dr. Michell Heinrich, dentist, last visit 2021, q68m  Patient Care Team: Unk Pinto, MD as PCP - General (Internal Medicine) Warden Fillers, MD as Consulting Physician (Optometry) Croitoru, Dani Gobble, MD as Consulting Physician (Cardiology) Sable Feil, MD as Consulting Physician (Gastroenterology) Mickle Plumb, MD (Gynecology) Druscilla Brownie, MD as Consulting Physician (Dermatology) Volanda Napoleon, MD as Consulting Physician (Oncology) Rozetta Nunnery, MD as Consulting Physician (Otolaryngology)  SURGICAL HISTORY She  has a past surgical history  that includes Eye surgery; Abdominal hysterectomy; Colonoscopy; Direct laryngoscopy (N/A, 10/21/2014); Mass excision (Right, 10/21/2014); and Excision nasal mass (Right, 10/21/2014). FAMILY HISTORY Her family history includes Breast cancer in her maternal grandmother and sister; Cancer in her brother and brother; Heart attack in her father; Hypertension in her mother; Other in her brother; Prostate cancer in her brother; Stroke in her mother. SOCIAL HISTORY She  reports that she quit smoking about 5 years ago. Her smoking use included cigarettes. She started smoking about 63 years ago. She has a 58.00 pack-year smoking history. She has never used smokeless tobacco. She reports that she does not drink alcohol and does not use drugs.   MEDICARE WELLNESS OBJECTIVES: Physical activity:   Cardiac risk factors:  Depression/mood screen:   Depression screen [facility] 2/9 11/18/2019  Decreased Interest 0  Down, Depressed, Hopeless 0  PHQ - 2 Score 0    ADLs:  No flowsheet data found.   Cognitive Testing  Alert? Yes  Normal Appearance?Yes  Oriented to person? Yes  Place? Yes   Time? Yes  Recall of three objects?  Yes  Can perform simple calculations? Yes  Displays appropriate judgment?Yes  Can read the correct time from a watch face?Yes  EOL planning: Does Patient Have a Medical Advance Directive?: No Would patient like information on creating a medical advance directive?: No - Patient declined  Review of Systems  Constitutional: Negative for malaise/fatigue and weight loss.  HENT: Negative for hearing loss and tinnitus.   Eyes: Negative for blurred vision and double vision.  Respiratory: Negative for cough, sputum production, shortness of breath and wheezing.   Cardiovascular: Negative for chest pain, palpitations, orthopnea, claudication, leg swelling and PND.  Gastrointestinal: Negative for abdominal pain, blood in stool, constipation, diarrhea, heartburn, melena, nausea and vomiting.   Genitourinary: Negative.   Musculoskeletal: Negative for falls, joint pain and myalgias.  Skin: Negative for rash.  Neurological: Negative for dizziness, tingling, sensory change, weakness and headaches.  Endo/Heme/Allergies: Negative for polydipsia.  Psychiatric/Behavioral: Negative.  Negative for depression, memory loss, substance abuse and suicidal ideas. The patient is not nervous/anxious and does not have insomnia.   All other systems reviewed and are negative.    Objective:     Today's Vitals   02/24/20 1046  BP: 140/82  Pulse: 77  Temp: 97.7 F (36.5 C)  SpO2: 99%  Weight: 144 lb 3.2 oz (65.4 kg)   Body mass index is 24.75 kg/m.  General appearance: alert, no distress, WD/WN, female HEENT: normocephalic, sclerae anicteric, TMs pearly, nares patent, no discharge or erythema, pharynx normal Oral cavity: MMM, no lesions Neck: supple, no lymphadenopathy, no thyromegaly, no masses Heart: RRR, normal S1, S2, no murmurs Lungs: CTA bilaterally, no wheezes, rhonchi, or rales Abdomen: +bs, soft, non tender, non distended, no masses, no hepatomegaly, no splenomegaly Musculoskeletal: nontender, no swelling, no obvious deformity Extremities: no edema, no cyanosis, no clubbing Pulses: 2+ symmetric, upper and lower extremities, normal cap refill Neurological: alert, oriented x 3, CN2-12 intact, strength normal upper extremities and lower extremities, sensation normal throughout, DTRs 2+ throughout, no cerebellar signs, gait normal Psychiatric: normal affect, behavior normal, pleasant   Medicare Attestation I have personally reviewed: The patient's medical and social history Their use of alcohol, tobacco or illicit drugs Their current medications and supplements The patient's functional ability including ADLs,fall risks, home safety risks, cognitive, and hearing and visual impairment Diet and physical activities Evidence for depression or mood disorders  The patient's weight,  height, BMI, and visual acuity have been recorded in the chart.  I have made referrals, counseling, and provided education to the patient based on review of the above and I have provided the patient with a written personalized care plan for preventive services.     Izora Ribas, NP   02/24/2020

## 2020-02-24 ENCOUNTER — Encounter: Payer: Self-pay | Admitting: Adult Health

## 2020-02-24 ENCOUNTER — Ambulatory Visit: Payer: Medicare PPO | Admitting: Adult Health

## 2020-02-24 ENCOUNTER — Other Ambulatory Visit: Payer: Self-pay

## 2020-02-24 VITALS — BP 140/82 | HR 77 | Temp 97.7°F | Wt 144.2 lb

## 2020-02-24 DIAGNOSIS — E559 Vitamin D deficiency, unspecified: Secondary | ICD-10-CM

## 2020-02-24 DIAGNOSIS — I700 Atherosclerosis of aorta: Secondary | ICD-10-CM

## 2020-02-24 DIAGNOSIS — N182 Chronic kidney disease, stage 2 (mild): Secondary | ICD-10-CM | POA: Diagnosis not present

## 2020-02-24 DIAGNOSIS — K5790 Diverticulosis of intestine, part unspecified, without perforation or abscess without bleeding: Secondary | ICD-10-CM

## 2020-02-24 DIAGNOSIS — R7309 Other abnormal glucose: Secondary | ICD-10-CM

## 2020-02-24 DIAGNOSIS — J849 Interstitial pulmonary disease, unspecified: Secondary | ICD-10-CM | POA: Diagnosis not present

## 2020-02-24 DIAGNOSIS — Z1159 Encounter for screening for other viral diseases: Secondary | ICD-10-CM | POA: Diagnosis not present

## 2020-02-24 DIAGNOSIS — I10 Essential (primary) hypertension: Secondary | ICD-10-CM | POA: Diagnosis not present

## 2020-02-24 DIAGNOSIS — M1990 Unspecified osteoarthritis, unspecified site: Secondary | ICD-10-CM

## 2020-02-24 DIAGNOSIS — Z0000 Encounter for general adult medical examination without abnormal findings: Secondary | ICD-10-CM

## 2020-02-24 DIAGNOSIS — J302 Other seasonal allergic rhinitis: Secondary | ICD-10-CM

## 2020-02-24 DIAGNOSIS — E782 Mixed hyperlipidemia: Secondary | ICD-10-CM | POA: Diagnosis not present

## 2020-02-24 DIAGNOSIS — K635 Polyp of colon: Secondary | ICD-10-CM

## 2020-02-24 DIAGNOSIS — Z87891 Personal history of nicotine dependence: Secondary | ICD-10-CM

## 2020-02-24 DIAGNOSIS — Z0001 Encounter for general adult medical examination with abnormal findings: Secondary | ICD-10-CM | POA: Diagnosis not present

## 2020-02-24 DIAGNOSIS — K219 Gastro-esophageal reflux disease without esophagitis: Secondary | ICD-10-CM

## 2020-02-24 DIAGNOSIS — R6889 Other general symptoms and signs: Secondary | ICD-10-CM | POA: Diagnosis not present

## 2020-02-24 DIAGNOSIS — Z6825 Body mass index (BMI) 25.0-25.9, adult: Secondary | ICD-10-CM

## 2020-02-24 DIAGNOSIS — J449 Chronic obstructive pulmonary disease, unspecified: Secondary | ICD-10-CM

## 2020-02-24 DIAGNOSIS — E785 Hyperlipidemia, unspecified: Secondary | ICD-10-CM

## 2020-02-24 DIAGNOSIS — Z79899 Other long term (current) drug therapy: Secondary | ICD-10-CM

## 2020-02-24 NOTE — Patient Instructions (Addendum)
Catherine Parks , Thank you for taking time to come for your Medicare Wellness Visit. I appreciate your ongoing commitment to your health goals. Please review the following plan we discussed and let me know if I can assist you in the future.   These are the goals we discussed: Goals    . Blood Pressure < 140/90    . Exercise 150 min/wk Moderate Activity    . LDL CALC < 100       This is a list of the screening recommended for you and due dates:  Health Maintenance  Topic Date Due  .  Hepatitis C: One time screening is recommended by Center for Disease Control  (CDC) for  adults born from [birth year] through 1965.   Never done  . Flu Shot  04/16/2020  . Mammogram  04/22/2020  . Tetanus Vaccine  05/07/2022  . DEXA scan (bone density measurement)  Completed  . COVID-19 Vaccine  Completed  . Pneumonia vaccines  Completed      Preventing High Cholesterol Cholesterol is a white, waxy substance similar to fat that the human body needs to help build cells. The liver makes all the cholesterol that a person's body needs. Having high cholesterol (hypercholesterolemia) increases a person's risk for heart disease and stroke. Extra (excess) cholesterol comes from the food the person eats. High cholesterol can often be prevented with diet and lifestyle changes. If you already have high cholesterol, you can control it with diet and lifestyle changes and with medicine. How can high cholesterol affect me? If you have high cholesterol, deposits (plaques) may build up on the walls of your arteries. The arteries are the blood vessels that carry blood away from your heart. Plaques make the arteries narrower and stiffer. This can limit or block blood flow and cause blood clots to form. Blood clots:  Are tiny balls of cells that form in your blood.  Can move to the heart or brain, causing a heart attack or stroke. Plaques in arteries greatly increase your risk for heart attack and stroke.Making diet and lifestyle  changes can reduce your risk for these conditions that may threaten your life. What can increase my risk? This condition is more likely to develop in people who:  Eat foods that are high in saturated fat or cholesterol. Saturated fat is mostly found in: ? Foods that contain animal fat, such as red meat and some dairy products. ? Certain fatty foods made from plants, such as tropical oils.  Are overweight.  Are not getting enough exercise.  Have a family history of high cholesterol. What actions can I take to prevent this? Nutrition   Eat less saturated fat.  Avoid trans fats (partially hydrogenated oils). These are often found in margarine and in some baked goods, fried foods, and snacks bought in packages.  Avoid precooked or cured meat, such as sausages or meat loaves.  Avoid foods and drinks that have added sugars.  Eat more fruits, vegetables, and whole grains.  Choose healthy sources of protein, such as fish, poultry, lean cuts of red meat, beans, peas, lentils, and nuts.  Choose healthy sources of fat, such as: ? Nuts. ? Vegetable oils, especially olive oil. ? Fish that have healthy fats (omega-3 fatty acids), such as mackerel or salmon. The items listed above may not be a complete list of recommended foods and beverages. Contact a dietitian for more information. Lifestyle  Lose weight if you are overweight. Losing 5-10 lb (2.3-4.5 kg) can help prevent  or control high cholesterol. It can also lower your risk for diabetes and high blood pressure. Ask your health care provider to help you with a diet and exercise plan to lose weight safely.  Do not use any products that contain nicotine or tobacco, such as cigarettes, e-cigarettes, and chewing tobacco. If you need help quitting, ask your health care provider.  Limit your alcohol intake. ? Do not drink alcohol if:  Your health care provider tells you not to drink.  You are pregnant, may be pregnant, or are planning to  become pregnant. ? If you drink alcohol:  Limit how much you use to:  0-1 drink a day for women.  0-2 drinks a day for men.  Be aware of how much alcohol is in your drink. In the [place] one drink equals one 12 oz bottle of beer (355 mL), one 5 oz glass of wine (148 mL), or one 1 oz glass of hard liquor (44 mL). Activity   Get enough exercise. Each week, do at least 150 minutes of exercise that takes a medium level of effort (moderate-intensity exercise). ? This is exercise that:  Makes your heart beat faster and makes you breathe harder than usual.  Allows you to still be able to talk. ? You could exercise in short sessions several times a day or longer sessions a few times a week. For example, on 5 days each week, you could walk fast or ride your bike 3 times a day for 10 minutes each time.  Do exercises as told by your health care provider. Medicines  In addition to diet and lifestyle changes, your health care provider may recommend medicines to help lower cholesterol. This may be a medicine to lower the amount of cholesterol your liver makes. You may need medicine if: ? Diet and lifestyle changes do not lower your cholesterol enough. ? You have high cholesterol and other risk factors for heart disease or stroke.  Take over-the-counter and prescription medicines only as told by your health care provider. General information  Manage your risk factors for high cholesterol. Talk with your health care provider about all your risk factors and how to lower your risk.  Manage other conditions that you have, such as diabetes or high blood pressure (hypertension).  Have blood tests to check your cholesterol levels at regular points in time as told by your health care provider.  Keep all follow-up visits as told by your health care provider. This is important. Where to find more information  American Heart Association: www.heart.org  National Heart, Lung, and Blood Institute:  https://wilson-eaton.com/ Summary  High cholesterol increases your risk for heart disease and stroke. By keeping your cholesterol level low, you can reduce your risk for these conditions.  High cholesterol can often be prevented with diet and lifestyle changes.  Work with your health care provider to manage your risk factors, and have your blood tested regularly. This information is not intended to replace advice given to you by your health care provider. Make sure you discuss any questions you have with your health care provider. Document Revised: 12/25/2018 Document Reviewed: 05/11/2016 Elsevier Patient Education  2020 [employer].

## 2020-02-25 ENCOUNTER — Other Ambulatory Visit: Payer: Self-pay | Admitting: Adult Health

## 2020-02-25 LAB — CBC WITH DIFFERENTIAL/PLATELET
Absolute Monocytes: 495 cells/uL (ref 200–950)
Basophils Absolute: 29 cells/uL (ref 0–200)
Basophils Relative: 0.6 %
Eosinophils Absolute: 426 cells/uL (ref 15–500)
Eosinophils Relative: 8.7 %
HCT: 39.4 % (ref 35.0–45.0)
Hemoglobin: 13.1 g/dL (ref 11.7–15.5)
Lymphs Abs: 1539 cells/uL (ref 850–3900)
MCH: 30.2 pg (ref 27.0–33.0)
MCHC: 33.2 g/dL (ref 32.0–36.0)
MCV: 90.8 fL (ref 80.0–100.0)
MPV: 9 fL (ref 7.5–12.5)
Monocytes Relative: 10.1 %
Neutro Abs: 2411 cells/uL (ref 1500–7800)
Neutrophils Relative %: 49.2 %
Platelets: 472 10*3/uL — ABNORMAL HIGH (ref 140–400)
RBC: 4.34 10*6/uL (ref 3.80–5.10)
RDW: 14.5 % (ref 11.0–15.0)
Total Lymphocyte: 31.4 %
WBC: 4.9 10*3/uL (ref 3.8–10.8)

## 2020-02-25 LAB — MAGNESIUM: Magnesium: 2.3 mg/dL (ref 1.5–2.5)

## 2020-02-25 LAB — COMPLETE METABOLIC PANEL WITH GFR
AG Ratio: 1.2 (calc) (ref 1.0–2.5)
ALT: 11 U/L (ref 6–29)
AST: 14 U/L (ref 10–35)
Albumin: 4.1 g/dL (ref 3.6–5.1)
Alkaline phosphatase (APISO): 140 U/L (ref 37–153)
BUN: 14 mg/dL (ref 7–25)
CO2: 27 mmol/L (ref 20–32)
Calcium: 10.9 mg/dL — ABNORMAL HIGH (ref 8.6–10.4)
Chloride: 104 mmol/L (ref 98–110)
Creat: 0.82 mg/dL (ref 0.60–0.93)
GFR, Est African American: 79 mL/min/{1.73_m2} (ref >=60)
GFR, Est Non African American: 68 mL/min/{1.73_m2} (ref >=60)
Globulin: 3.4 g/dL (calc) (ref 1.9–3.7)
Glucose, Bld: 95 mg/dL (ref 65–99)
Potassium: 3.7 mmol/L (ref 3.5–5.3)
Sodium: 140 mmol/L (ref 135–146)
Total Bilirubin: 0.3 mg/dL (ref 0.2–1.2)
Total Protein: 7.5 g/dL (ref 6.1–8.1)

## 2020-02-25 LAB — LIPID PANEL
Cholesterol: 211 mg/dL — ABNORMAL HIGH (ref <200)
HDL: 64 mg/dL (ref >=50)
LDL Cholesterol (Calc): 129 mg/dL (calc) — ABNORMAL HIGH
Non-HDL Cholesterol (Calc): 147 mg/dL (calc) — ABNORMAL HIGH (ref <130)
Total CHOL/HDL Ratio: 3.3 (calc) (ref <5.0)
Triglycerides: 85 mg/dL (ref <150)

## 2020-02-25 LAB — TSH: TSH: 0.93 mIU/L (ref 0.40–4.50)

## 2020-02-25 LAB — HEPATITIS C ANTIBODY
Hepatitis C Ab: NONREACTIVE
SIGNAL TO CUT-OFF: 0.02 (ref <1.00)

## 2020-02-25 MED ORDER — ROSUVASTATIN CALCIUM 5 MG PO TABS: ORAL_TABLET | ORAL | 1 refills | Status: DC

## 2020-04-04 ENCOUNTER — Telehealth: Payer: Self-pay | Admitting: Internal Medicine

## 2020-04-04 NOTE — Telephone Encounter (Signed)
Dr. Chase Caller Please call Dr. Duke Salvia for a peer to peer please.  Peer to Peer about CT of chest ordered for patient. Questioning if CT Chest is needed and/or a low dose CT could be ordered. Please advise by 04/05/20 [phone]

## 2020-04-11 NOTE — Telephone Encounter (Signed)
Pt's HRCT has been rescheduled for 04/24/20.  MR, please advise on the message from Dr. Duke Salvia about the CT.

## 2020-04-12 NOTE — Telephone Encounter (Signed)
Called and spoke with Dr. Duke Salvia stating to him the info per MR. Dr. Duke Salvia verbalized understanding. Nothing further needed.

## 2020-04-12 NOTE — Telephone Encounter (Signed)
She has ILD. So she needs HRCT supine and prone

## 2020-04-17 ENCOUNTER — Other Ambulatory Visit (HOSPITAL_COMMUNITY): Payer: Medicare PPO

## 2020-04-20 ENCOUNTER — Other Ambulatory Visit: Payer: Self-pay

## 2020-04-20 ENCOUNTER — Other Ambulatory Visit: Payer: Self-pay | Admitting: Internal Medicine

## 2020-04-20 ENCOUNTER — Ambulatory Visit (INDEPENDENT_AMBULATORY_CARE_PROVIDER_SITE_OTHER): Payer: Medicare PPO | Admitting: Internal Medicine

## 2020-04-20 DIAGNOSIS — J849 Interstitial pulmonary disease, unspecified: Secondary | ICD-10-CM | POA: Diagnosis not present

## 2020-04-20 LAB — PULMONARY FUNCTION TEST
DL/VA % pred: 59 %
DL/VA: 2.41 ml/min/mmHg/L
DLCO cor % pred: 54 %
DLCO cor: 10.92 ml/min/mmHg
DLCO unc % pred: 53 %
DLCO unc: 10.81 ml/min/mmHg
FEF 25-75 Pre: 0.92 L/sec
FEF2575-%Pred-Pre: 61 %
FEV1-%Pred-Pre: 100 %
FEV1-Pre: 1.78 L
FEV1FVC-%Pred-Pre: 84 %
FEV6-%Pred-Pre: 123 %
FEV6-Pre: 2.71 L
FEV6FVC-%Pred-Pre: 101 %
FVC-%Pred-Pre: 121 %
FVC-Pre: 2.78 L
Pre FEV1/FVC ratio: 64 %
Pre FEV6/FVC Ratio: 97 %

## 2020-04-20 NOTE — Progress Notes (Signed)
Spirometry and dlco done today. 

## 2020-04-21 ENCOUNTER — Ambulatory Visit (HOSPITAL_COMMUNITY)
Admission: RE | Admit: 2020-04-21 | Discharge: 2020-04-21 | Disposition: A | Discharge status: Home / Self Care | Payer: Medicare PPO | Source: Ambulatory Visit | Attending: Internal Medicine | Admitting: Internal Medicine

## 2020-04-21 DIAGNOSIS — J849 Interstitial pulmonary disease, unspecified: Secondary | ICD-10-CM | POA: Diagnosis not present

## 2020-04-21 DIAGNOSIS — I10 Essential (primary) hypertension: Secondary | ICD-10-CM | POA: Diagnosis not present

## 2020-04-21 DIAGNOSIS — Z87891 Personal history of nicotine dependence: Secondary | ICD-10-CM | POA: Diagnosis not present

## 2020-04-21 DIAGNOSIS — R0600 Dyspnea, unspecified: Secondary | ICD-10-CM | POA: Diagnosis not present

## 2020-04-21 DIAGNOSIS — R911 Solitary pulmonary nodule: Secondary | ICD-10-CM | POA: Insufficient documentation

## 2020-04-21 DIAGNOSIS — J439 Emphysema, unspecified: Secondary | ICD-10-CM | POA: Diagnosis not present

## 2020-04-21 LAB — ECHOCARDIOGRAM COMPLETE
Area-P 1/2: 2.96 cm2
S' Lateral: 3.2 cm

## 2020-04-21 NOTE — Progress Notes (Signed)
  Echocardiogram 2D Echocardiogram has been performed.  Catherine Parks 04/21/2020, 1:45 PM

## 2020-04-24 ENCOUNTER — Ambulatory Visit (INDEPENDENT_AMBULATORY_CARE_PROVIDER_SITE_OTHER)
Admission: RE | Admit: 2020-04-24 | Discharge: 2020-04-24 | Disposition: A | Discharge status: Home / Self Care | Payer: Medicare PPO | Source: Ambulatory Visit | Attending: Internal Medicine | Admitting: Internal Medicine

## 2020-04-24 ENCOUNTER — Other Ambulatory Visit: Payer: Self-pay

## 2020-04-24 DIAGNOSIS — J432 Centrilobular emphysema: Secondary | ICD-10-CM | POA: Diagnosis not present

## 2020-04-24 DIAGNOSIS — J849 Interstitial pulmonary disease, unspecified: Secondary | ICD-10-CM | POA: Diagnosis not present

## 2020-04-24 DIAGNOSIS — I272 Pulmonary hypertension, unspecified: Secondary | ICD-10-CM | POA: Diagnosis not present

## 2020-04-26 ENCOUNTER — Telehealth: Payer: Self-pay | Admitting: Internal Medicine

## 2020-04-26 DIAGNOSIS — J849 Interstitial pulmonary disease, unspecified: Secondary | ICD-10-CM

## 2020-04-26 DIAGNOSIS — Z1231 Encounter for screening mammogram for malignant neoplasm of breast: Secondary | ICD-10-CM | POA: Diagnosis not present

## 2020-04-26 DIAGNOSIS — R931 Abnormal findings on diagnostic imaging of heart and coronary circulation: Secondary | ICD-10-CM

## 2020-04-26 LAB — HM MAMMOGRAPHY

## 2020-04-26 NOTE — Telephone Encounter (Signed)
Catherine Parks - reviewed both echo and CT reports. CT shows oingoing ILD. ECHo shows new drop in EF compared to 2 y ears ago. Weaker heart pump  Plan  - keep appt with me 8/16  - refer to cardiology - [facility] or [facility] - first available.

## 2020-04-26 NOTE — Telephone Encounter (Signed)
Called and spoke with pt letting her know the info stated by MR of the CT and echo results and stated to her that we were referring her to cardiology. Pt verbalized understanding. Nothing further needed.

## 2020-05-01 ENCOUNTER — Encounter: Payer: Self-pay | Admitting: Internal Medicine

## 2020-05-01 ENCOUNTER — Other Ambulatory Visit: Payer: Self-pay

## 2020-05-01 ENCOUNTER — Ambulatory Visit: Payer: Medicare PPO | Admitting: Internal Medicine

## 2020-05-01 VITALS — BP 150/88 | HR 69 | Temp 97.2°F | Ht 64.0 in | Wt 144.8 lb

## 2020-05-01 DIAGNOSIS — J439 Emphysema, unspecified: Secondary | ICD-10-CM | POA: Diagnosis not present

## 2020-05-01 DIAGNOSIS — J849 Interstitial pulmonary disease, unspecified: Secondary | ICD-10-CM

## 2020-05-01 DIAGNOSIS — R931 Abnormal findings on diagnostic imaging of heart and coronary circulation: Secondary | ICD-10-CM

## 2020-05-01 DIAGNOSIS — R768 Other specified abnormal immunological findings in serum: Secondary | ICD-10-CM | POA: Diagnosis not present

## 2020-05-01 DIAGNOSIS — R911 Solitary pulmonary nodule: Secondary | ICD-10-CM

## 2020-05-01 MED ORDER — SPIRIVA RESPIMAT 1.25 MCG/ACT IN AERS: 2.0000 | INHALATION_SPRAY | Freq: Every day | RESPIRATORY_TRACT | 0 refills | Status: DC

## 2020-05-01 NOTE — Patient Instructions (Addendum)
ICD-10-CM   1. ILD (interstitial lung disease) ([place])  J84.9   2. Elevated IgE level  R76.8   3. Pulmonary emphysema, unspecified emphysema type ([place])  J43.9   4. Solitary pulmonary nodule  R91.1   5. Decreased cardiac ejection fraction  R93.1    ILD (interstitial lung disease) ([place])  - clinically stable over time. Reason not known but could be to due autoimmune antibodies. Need to evalute for any organic antigen exposure  Plan  - check HP panel  - next visit fill out ILD questionnaire - will hold off antifibrotics for now given baseline GI issues and stability of lung functioin  Elevated IgE level in 2017  Plan  - recheck IgE  Pulmonary emphysema, unspecified emphysema type ([place])  - start spirivan sample and give [place] feedback   Decreased cardiac ejection fraction  - this is new. You have appt with Dr Harrington Challenger 06/16/20  Plan  - see if someone can see you sooner  Shortness of breath  - due to all of above - hold off rehab till cardiology clears you  Solitary Pulmonary Nodule - stable 2018 -> 2021 august  Plan  - no further followup   Followup  - 4 months do spirometry and dlco - 4 months - 30 min visit

## 2020-05-01 NOTE — Progress Notes (Signed)
'@Patient'  ID: Catherine Parks, female    DOB: 1941/02/02, 79 y.o.   MRN: [id number]  Chief Complaint  Patient presents with  . Follow-up    pft    Referring provider: Unk Pinto, MD  HPI  79 year old female former smoker followed for mild COPD with emphysema , lung nodule and ILD   TESTS: High resolution CT chest November 2017 showed ILD changes with  basilar predominant patchy subpleural reticulation, traction bronchiectasis and mild architectural distortion with mild honeycombing at the right base.  CT chest May 2018 showed no change in numerous tiny pulmonary nodules scattered throughout the lung 6 mm dating back to October 2017, stable ILD changes CT chest November 2018 showed stable pulmonary nodules felt to be benign with no specific follow-up noted.  Chronic changes of interstitial fibrosis without change.  PFT 06/13/17: FVC 2.89 L (121%) FEV1 1.91 L (103%) FEV1/FVC 0.66 FEF 25-75 1.00 L (63%) DLCO corrected 46% 12/27/16: FVC 2.81 L (117%) FEV1 1.85 L (99%) FEV1/FVC 0.66 FEF 25-75 0.93 L (58%) DLCO uncorrected 40% 09/27/16: FVC 2.92 L (121%) FEV1 2.01 L (180%) FEV1/FVC 0.69 FEF 25-75 1.20 L (74%) negative bronchodilator response TLC 5.05 L (94%) RV 85% ERV 501% DLCO corrected 50% (Hgb 12.7)  PFT Results Latest Ref Rng & Units 02/26/2019 06/13/2017 12/27/2016 09/27/2016  FVC-Pre L 2.75 2.89 2.81 2.92  FVC-Predicted Pre % 118 121 117 121  FVC-Post L 2.75 - - 2.95  FVC-Predicted Post % 118 - - 122  Pre FEV1/FVC % % 64 66 66 69  Post FEV1/FCV % % 65 - - 70  FEV1-Pre L 1.77 1.91 1.85 2.01  FEV1-Predicted Pre % 98 103 99 108  FEV1-Post L 1.79 - - 2.07  DLCO UNC% % 57 47 40 49  DLCO COR %Predicted % 68 51 48 60  TLC L 5.10 - - 5.05   TLC % Predicted % 95 - - 94  RV % Predicted % 99 - - 85     LABS 08/02/16 Alpha-1 antitrypsin: MM (153) IgG: 1276 IgA: 658 IgM: 76 IgE: 1652 RAST Panel: D farinae 1.75 / Cockroach 1.66 &other weak positives CRP: 0.1 ESR: 59 ANA: Positive DS DNA Ab: 181 Smith Ab: <0.2 RNP Ab: <0.2 SSA: <0.2 SSB: <0.2 Anti-CCP: <16  OV 02/26/19 - follow up mild COPD, ILD, pulmonary nodules Patient presents for follow-up visit today.  She was last seen by Rexene Edison on 12/24/2017.  She states that this is been a stable interval for her.  She has not seen any increase in her shortness of breath.  She states that she has been more inactive than usual due to current COVID pandemic.  She was going to the [facility] to walk 3 times per week but the [employer] has been closed for the last few months.  Patient tries to remain active inside her house and does lift some light weight dumbbells.  She does continue to get winded with heavy activity.  She is not on any inhalers.  Patient has known ILD changes on CT scan dating back to October 2017.  Previous CT chest showed stable changes.  Patient did have a PFT completed in office today and it is stable from last PFT.  Patient has had previous autoimmune work-up that was suggestive of lupus and was referred to rheumatology.  She is not on no suppression.   Patient has a history of pulmonary nodules on CT scan.  These nodules had shown no change and were  consistent with a benign etiology.  Patient's last CT chest was in 2018.  OV 01/19/2020  Subjective:  Patient ID: Catherine Parks, female , DOB: [birth year] , age 62 y.o. , MRN: [id number] , ADDRESS: [place] [id number]   01/19/2020 -   Chief Complaint  Patient presents with  . Follow-up    no concerns   Follow-up combination emphysema and ILD with pulmonary nodule. ANA positive and double-stranded DNA positive in 2017  HPI Catherine Parks 79 y.o. -returns for follow-up.  I  personally not seen her in few to several years.  She is seen the nurse practitioner in between.  Nurse practitioner notes her above.  Overall she tells me she is doing stable.  She is very minimal symptoms.  Symptom score is documented below.  For positive ANA and double-stranded DNA last year she is did see a rheumatologist.  She does not know who she saw but said she was reassured that she did not have any systemic evidence of connective tissue disease.  She is not taking any inhalers.  She feels that her shortness of breath on exertion is much improved.  She only notices it for stairs but even then it is minimal.  Her most recent test results were reviewed.  She is interested in serial monitoring and ensuring disease is stable.  She is concerned about interstitial lung disease.  She had a cardiac stress test in 2019 that was normal.  Her CT scan of the chest also shows enlarged pulmonary arteries.  She has grade 1 diastolic dysfunction on an echo.    OV 05/01/2020   Subjective:  Patient ID: Catherine Parks, female , DOB: [birth year], age 32 y.o. years. , MRN: [id number],  ADDRESS: [place] [address] PCP  Unk Pinto, MD Providers : Treatment Team:  Attending Provider: Brand Males, MD   Chief Complaint  Patient presents with  . Follow-up    Pt states she has been doing okay since last visit. States that she still becomes SOB with exertion.    Follow-up combination emphysema and ILD with pulmonary nodule. ANA positive and double-stranded DNA positive 181 in 2017 Positive IGE - 1652 , and positive enviromental allergy test RAST - Nov 2017 Piror > 50 pack smoker  HPI Catherine Parks 79 y.o. -presents for follow-up for the above medical issues.  This is to discuss the results of investigation.  At this point in time she tells me that her dyspnea is somewhat worse.  No chest pain or cough.  In the past inhalers have not worked.  Her pulmonary function test  and CT scan of the chest shows stability in both ILD and emphysema and solitary pulmonary nodule.  She feels overall that her ILD has been there for a long time and it is been stable.  However she feels dyspnea is worse.  The interim finding is that she has new depressed ejection fraction of the left ventricle.  She has an appointment Dr. Dorris Carnes in June 16, 2020.  There is no associated chest pain.  There is no orthopnea there is no proximal nocturnal dyspnea.  She is reluctant about antifibrotic's because she has GI issues although she has marked 0 for vomiting and diarrhea.  Review of the labs indicate that in 2017 she was also significantly positive IgE and other allergens.  She tells me there is no mold in the house.  She has no pet birds.  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     SYMPTOM SCALE - ILD 01/19/2020  05/01/2020   O2 use ra ra  Shortness of Breath 0 -> 5 scale with 5 being worst (score 6 If unable to do)   At rest 0 0  Simple tasks - showers, clothes change, eating, shaving 0 1  Household (dishes, doing bed, laundry) 1 3  Shopping 1 3  Walking level at own pace 1 2  Walking up Stairs 2 3  Total (30-36) Dyspnea Score 5 12  How bad is your cough? 0 1  How bad is your fatigue 1 0  How bad is nausea 0 0  How bad is vomiting?  0 0  How bad is diarrhea? 0 0  How bad is anxiety? 0 2  How bad is depression 0 1   Simple office walk 185 feet x  3 laps goal with forehead probe 01/19/2020   O2 used ra  Number laps completed 3  Comments about pace x  Resting Pulse Ox/HR 100% and x/min  Final Pulse Ox/HR 98% and 98/min  Desaturated </= 88% no  Desaturated <= 3% points no  Got Tachycardic >/= 90/min yes  Symptoms at end of test x  Miscellaneous comments x   Results for CHANTIA, AMALFITANO (MRN [id number]) as of 05/01/2020 09:03  Ref. Range 09/27/2016 10:23 12/27/2016 08:50 06/13/2017 13:24 02/26/2019 08:48 04/20/2020 10:47   FVC-Pre Latest Units: L 2.92 2.81 2.89 2.75 2.78  FVC-%Pred-Pre Latest Units: % 121 117 121 118 121   Results for KESHAWN, SUNDBERG (MRN [id number]) as of 05/01/2020 09:03  Ref. Range 09/27/2016 10:23 12/27/2016 08:50 06/13/2017 13:24 02/26/2019 08:48 04/20/2020 10:47  DLCO unc Latest Units: ml/min/mmHg 13.33 10.95 12.85 11.55 10.81  DLCO unc % pred Latest Units: % 49 40 47 57 53      HRCT 04/24/20  CLINICAL DATA:  Interstitial lung disease. Increased shortness of breath with exertion the past few years.  EXAM: CT CHEST WITHOUT CONTRAST  TECHNIQUE: Multidetector CT imaging of the chest was performed following the standard protocol without intravenous contrast. High resolution imaging of the lungs, as well as inspiratory and expiratory imaging, was performed.  COMPARISON:  03/17/2019 07/17/2017.  FINDINGS: Cardiovascular: Atherosclerotic calcification of the aorta, aortic valve and coronary arteries. Pulmonic trunk is enlarged. Heart size normal. No pericardial effusion.  Mediastinum/Nodes: 1.2 cm low-attenuation left thyroid nodule. No follow-up recommended (ref: J Am Coll Radiol. 2015 Feb;12(2): 143-50).No pathologically enlarged mediastinal or axillary lymph nodes. Hilar regions are difficult to definitively evaluate without IV contrast but appear grossly unremarkable. Esophagus is slightly dilated.  Lungs/Pleura: Centrilobular and paraseptal emphysema. There is traction bronchiectasis, ground-glass and mild architectural distortion predominating in the lower lobes, as on prior exams. Minimal subpleural reticulation is seen in association. No honeycombing. 5 mm anterior left lower lobe nodule (3/120) is unchanged from 07/17/2017 and considered benign. No pleural fluid. Airway is unremarkable. Minimal air trapping.  Upper Abdomen: Visualized portions of the liver, adrenal glands, kidneys, spleen, pancreas, stomach and bowel are grossly unremarkable. No upper abdominal  adenopathy.  Musculoskeletal: No worrisome lytic or sclerotic lesions. T12 and L1 compression fractures are new. No worrisome lytic or sclerotic lesions.  IMPRESSION: 1. Pulmonary parenchymal pattern of fibrosis may be due to fibrotic nonspecific interstitial pneumonitis. Chronic hypersensitivity pneumonitis is not excluded. Findings are indeterminate for UIP per consensus guidelines: Diagnosis of Idiopathic Pulmonary Fibrosis: An Official ATS/ERS/JRS/ALAT Clinical Practice  Guideline. [place], Iss 5, [phone], May 17 2017. 2. Aortic atherosclerosis (ICD10-I70.0). Coronary artery calcification. 3. Enlarged pulmonic trunk, indicative of pulmonary arterial hypertension. 4.  Emphysema (ICD10-J43.9).   Electronically Signed   By: Lorin Picket M.D.   On: 04/24/2020 12:47   ECHO 04/21/20   Sonographer:  Jannett Celestine RDCS (AE)  Referring Phys: [id number]   IMPRESSIONS    1. Left ventricular ejection fraction, by estimation, is 35 to 40%. The  left ventricle has moderately decreased function. The left ventricle  demonstrates global hypokinesis. Left ventricular diastolic parameters are  consistent with Grade I diastolic  dysfunction (impaired relaxation). The average left ventricular global  longitudinal strain is -15.8 %.  2. Right ventricular systolic function is mildly reduced. The right  ventricular size is normal.  3. The mitral valve is grossly normal. Trivial mitral valve  regurgitation.  4. The aortic valve is grossly normal. Aortic valve regurgitation is not  visualized. No aortic stenosis is present.     ROS - per HPI     has a past medical history of Allergic rhinitis, Arthritis, Bronchiectasis ([place]), Colon polyps, Diverticulosis, Emphysema of lung ([place]), GERD (gastroesophageal reflux disease), HSV-1 (herpes simplex virus 1) infection, Hypertension, and Pulmonary nodule (07/10/2017).   reports that she quit  smoking about 5 years ago. Her smoking use included cigarettes. She started smoking about 63 years ago. She has a 58.00 pack-year smoking history. She has never used smokeless tobacco.  Past Surgical History:  Procedure Laterality Date  . ABDOMINAL HYSTERECTOMY    . COLONOSCOPY    . DIRECT LARYNGOSCOPY N/A 10/21/2014   Procedure: DIRECT LARYNGOSCOPY;  Surgeon: Rozetta Nunnery, MD;  Location: [facility];  Service: ENT;  Laterality: N/A;  . EXCISION NASAL MASS Right 10/21/2014   Procedure: EXCISION NASOPHARYNGEAL MASS;  Surgeon: Rozetta Nunnery, MD;  Location: [facility];  Service: ENT;  Laterality: Right;  . EYE SURGERY     cataracts  . MASS EXCISION Right 10/21/2014   Procedure: RIGHT NECK NODE BIOPSY;  Surgeon: Rozetta Nunnery, MD;  Location: [facility];  Service: ENT;  Laterality: Right;    Allergies  Allergen Reactions  . Ace Inhibitors Cough  . Lemon Oil Hives    Reaction to lemons  . Shellfish Allergy Swelling and Hives    Immunization History  Administered Date(s) Administered  . DTaP 07/30/2011  . Influenza, High Dose Seasonal PF 06/07/2016, 06/25/2017, 09/22/2018, 07/19/2019  . PFIZER SARS-COV-2 Vaccination 10/05/2019, 10/25/2019  . Pneumococcal Conjugate-13 11/22/2015  . Pneumococcal Polysaccharide-23 05/11/2013  . Pneumococcal-Unspecified 01/07/2002  . Td 01/07/2002, 05/07/2012    Family History  Problem Relation Age of Onset  . Heart attack Father   . Hypertension Mother   . Stroke Mother   . Breast cancer Sister   . Cancer Brother   . Prostate cancer Brother   . Breast cancer Maternal Grandmother   . Other Brother        Heart Issues  . Cancer Brother   . Lung disease Neg Hx   . Rheumatologic disease Neg Hx      Current Outpatient Medications:  .  acetaminophen (TYLENOL) 500 MG tablet, Take 500 mg by mouth every 6 (six) hours as needed (pain)., Disp: , Rfl:  .  amLODipine (NORVASC) 10 MG tablet,  Take 1 tablet Daily for BP, Disp: 90 tablet, Rfl: 3 .  Ascorbic Acid (VITAMIN C) 100 MG tablet, Take  100 mg by mouth daily., Disp: , Rfl:  .  aspirin EC 81 MG tablet, Take 81 mg by mouth daily.  , Disp: , Rfl:  .  cholecalciferol (VITAMIN D) 1000 units tablet, Take 6,000 Units by mouth daily after lunch. , Disp: , Rfl:  .  cloNIDine (CATAPRES) 0.2 MG tablet, 0.2 mg as needed., Disp: , Rfl:  .  famotidine (PEPCID) 40 MG tablet, Take 1 tablet every Day for Indigestion  & Heartburn, Disp: 90 tablet, Rfl: 3 .  halobetasol (ULTRAVATE) 0.05 % cream, Apply topically 2 (two) times daily. Apply to Eczema Rash 2 x/day, Disp: 100 g, Rfl: 11 .  Magnesium 500 MG TABS, Take 500 mg by mouth., Disp: , Rfl:  .  Naphazoline HCl (CLEAR EYES OP), Place 1 drop into both eyes daily., Disp: , Rfl:  .  nitroGLYCERIN (NITROSTAT) 0.4 MG SL tablet, Place 0.4 mg under the tongue every 5 (five) minutes as needed for chest pain., Disp: , Rfl:  .  OVER THE COUNTER MEDICATION, Takes OTC eczema cream PRN., Disp: , Rfl:  .  rosuvastatin (CRESTOR) 5 MG tablet, Take 1 tab in the evening three days a week (MWF) for cholesterol to reduce heart attack and stroke risk., Disp: 36 tablet, Rfl: 1 .  traMADol (ULTRAM) 50 MG tablet, Take 1 tablet (50 mg total) by mouth every 12 (twelve) hours as needed for moderate pain. 1 pill twice daily as needed for pain, Disp: 60 tablet, Rfl: 0 .  triamterene-hydrochlorothiazide (MAXZIDE-25) 37.5-25 MG tablet, TAKE 1 TABLET DAILY FOR BP & FLUID, Disp: 90 tablet, Rfl: 3 .  Wheat Dextrin (BENEFIBER PO), Take 1 Dose by mouth daily., Disp: , Rfl:  .  zinc gluconate 50 MG tablet, Take 50 mg by mouth daily., Disp: , Rfl:  .  Flaxseed, Linseed, (FLAX SEED OIL) 1000 MG CAPS, Take 1,000 mg by mouth daily after lunch.  (Patient not taking: Reported on 05/01/2020), Disp: , Rfl:  .  nitroGLYCERIN (NITROSTAT) 0.4 MG SL tablet, Place 1 tablet (0.4 mg total) under the tongue every 5 (five) minutes as needed for chest  pain., Disp: 25 tablet, Rfl: 3 .  Tiotropium Bromide Monohydrate (SPIRIVA RESPIMAT) 1.25 MCG/ACT AERS, Inhale 2 puffs into the lungs daily., Disp: 4 g, Rfl: 0      Objective:   Vitals:   05/01/20 0858  BP: (!) 150/88  Pulse: 69  Temp: (!) 97.2 F (36.2 C)  TempSrc: Temporal  SpO2: 98%  Weight: 144 lb 12.8 oz (65.7 kg)  Height: '5\' 4"'  (1.626 m)    Estimated body mass index is 24.85 kg/m as calculated from the following:   Height as of this encounter: '5\' 4"'  (1.626 m).   Weight as of this encounter: 144 lb 12.8 oz (65.7 kg).  '@WEIGHTCHANGE' @  [employer]   05/01/20 0858  Weight: 144 lb 12.8 oz (65.7 kg)     Physical Exam Thin pleasant female.  Hardly can hear any crackles no murmurs no evidence of sarcoidosis.  No stigmata of connective tissue disease.  Alert and oriented x3 no elevated JVP.  No neck nodes.  Overall nonfocal exam       Assessment:       ICD-10-CM   1. ILD (interstitial lung disease) ([place])  J84.9   2. Elevated IgE level  R76.8   3. Pulmonary emphysema, unspecified emphysema type ([place])  J43.9   4. Solitary pulmonary nodule  R91.1   5. Decreased cardiac ejection fraction  R93.1  Plan:     Patient Instructions     ICD-10-CM   1. ILD (interstitial lung disease) ([place])  J84.9   2. Elevated IgE level  R76.8   3. Pulmonary emphysema, unspecified emphysema type ([place])  J43.9   4. Solitary pulmonary nodule  R91.1   5. Decreased cardiac ejection fraction  R93.1    ILD (interstitial lung disease) ([place])  - clinically stable over time. Reason not known but could be to due autoimmune antibodies. Need to evalute for any organic antigen exposure  Plan  - check HP panel  - next visit fill out ILD questionnaire - will hold off antifibrotics for now given baseline GI issues and stability of lung functioin  Elevated IgE level in 2017  Plan  - recheck IgE  Pulmonary emphysema, unspecified emphysema type ([place])  - start spirivan sample and give [place]  feedback   Decreased cardiac ejection fraction  - this is new. You have appt with Dr Harrington Challenger 06/16/20  Plan  - see if someone can see you sooner  Shortness of breath  - due to all of above - hold off rehab till cardiology clears you  Solitary Pulmonary Nodule - stable 2018 -> 2021 august  Plan  - no further followup   Followup  - 4 months do spirometry and dlco - 4 months - 30 min visit   SIGNATURE    Dr. Brand Males, M.D., F.C.C.P,  Pulmonary and Critical Care Medicine Staff Physician, [place] Director - Interstitial Lung Disease  Program  P[facility] [place] at [place], [place], [address]  Pager: [phone], If no answer or between  15:00h - 7:00h: call [phone] Telephone: [phone]  9:26 AM 05/01/2020

## 2020-05-04 ENCOUNTER — Other Ambulatory Visit: Payer: Self-pay | Admitting: Internal Medicine

## 2020-05-06 LAB — HYPERSENSITIVITY PNUEMONITIS PROFILE
ASPERGILLUS FUMIGATUS: NEGATIVE
Faenia retivirgula: NEGATIVE
Pigeon Serum: NEGATIVE
S. VIRIDIS: NEGATIVE
T. CANDIDUS: NEGATIVE
T. VULGARIS: NEGATIVE

## 2020-05-06 LAB — IGE: IgE (Immunoglobulin E), Serum: 1411 kU/L — ABNORMAL HIGH (ref <=114)

## 2020-05-07 NOTE — Progress Notes (Signed)
Blood IgE stil strongly positive. In 2017 allergy test positive - please refer to Dr Hardie Pulley or Dr Harold Hedge if she does not have an allergist

## 2020-05-10 ENCOUNTER — Telehealth: Payer: Self-pay | Admitting: Internal Medicine

## 2020-05-10 DIAGNOSIS — J302 Other seasonal allergic rhinitis: Secondary | ICD-10-CM

## 2020-05-10 NOTE — Telephone Encounter (Signed)
Spoke with the pt and notified her of the lab results and recs per MR  She verbalized understanding  She states that she has not seen allergist in years  Will refer

## 2020-05-10 NOTE — Telephone Encounter (Signed)
Blood IgE stil strongly positive. In 2017 allergy test positive - please refer to Dr Hardie Pulley or Dr Harold Hedge if she does not have an allergist

## 2020-05-10 NOTE — Progress Notes (Signed)
Pt notified of results and I made her referral

## 2020-05-31 DIAGNOSIS — J301 Allergic rhinitis due to pollen: Secondary | ICD-10-CM | POA: Diagnosis not present

## 2020-05-31 DIAGNOSIS — R0602 Shortness of breath: Secondary | ICD-10-CM | POA: Diagnosis not present

## 2020-05-31 DIAGNOSIS — J3081 Allergic rhinitis due to animal (cat) (dog) hair and dander: Secondary | ICD-10-CM | POA: Diagnosis not present

## 2020-05-31 DIAGNOSIS — J3089 Other allergic rhinitis: Secondary | ICD-10-CM | POA: Diagnosis not present

## 2020-05-31 NOTE — Progress Notes (Signed)
History of Present Illness:       This very nice 79 y.o.  WBF presents for 6 month follow up with HTN, HLD, Pre-Diabetes and Vitamin D Deficiency.        Patient is also  followed  by Dr Chase Caller  For mild COPD & ILD.       Patient is treated for HTN  (1975) & BP has been controlled at home. Today's BP is at goal - 136/76.  Negative Cardiolite in 2019.  Patient has had no complaints of any cardiac type chest pain, palpitations, dyspnea / orthopnea / PND, dizziness, claudication  or dependent edema.      Hyperlipidemia is controlled with diet & meds. Patient denies myalgias or other med SE's. Last Lipids were not at goal:   Lab Results  Component Value Date   CHOL 211 (H) 02/24/2020   HDL 64 02/24/2020   LDLCALC 129 (H) 02/24/2020   TRIG 85 02/24/2020   CHOLHDL 3.3 02/24/2020    Also, the patient has history of PreDiabetes and has had no symptoms of reactive hypoglycemia, diabetic polys, paresthesias or visual blurring.  Last A1c was not at goal:  Lab Results  Component Value Date   HGBA1C 5.8 (H) 11/19/2019       Further, the patient also has history of Vitamin D Deficiency ("18" /2008) and supplements vitamin D without any suspected side-effects. Last vitamin D was  at goal: Lab Results  Component Value Date   VD25OH 87 11/19/2019    Current Outpatient Medications on File Prior to Visit  Medication Sig  . acetaminophen (TYLENOL) 500 MG tablet Take 500 mg by mouth every 6 (six) hours as needed (pain).  Marland Kitchen amLODipine (NORVASC) 10 MG tablet TAKE 1 TABLET DAILY FOR BLOOD PRESSURE  . Ascorbic Acid (VITAMIN C) 100 MG tablet Take 100 mg by mouth daily.  Marland Kitchen aspirin EC 81 MG tablet Take 81 mg by mouth daily.    . cholecalciferol (VITAMIN D) 1000 units tablet Take 6,000 Units by mouth daily after lunch.   . cloNIDine (CATAPRES) 0.2 MG tablet 0.2 mg as needed.  . famotidine (PEPCID) 40 MG tablet Take 1 tablet every Day for Indigestion  & Heartburn  . Flaxseed, Linseed,  (FLAX SEED OIL) 1000 MG CAPS Take 1,000 mg by mouth daily after lunch.   . halobetasol (ULTRAVATE) 0.05 % cream Apply topically 2 (two) times daily. Apply to Eczema Rash 2 x/day  . Magnesium 500 MG TABS Take 500 mg by mouth.  . Naphazoline HCl (CLEAR EYES OP) Place 1 drop into both eyes daily.  . nitroGLYCERIN (NITROSTAT) 0.4 MG SL tablet Place 0.4 mg under the tongue every 5 (five) minutes as needed for chest pain.  Marland Kitchen OVER THE COUNTER MEDICATION Takes OTC eczema cream PRN.  . rosuvastatin (CRESTOR) 5 MG tablet Take 1 tab in the evening three days a week (MWF) for cholesterol to reduce heart attack and stroke risk.  . Tiotropium Bromide Monohydrate (SPIRIVA RESPIMAT) 1.25 MCG/ACT AERS Inhale 2 puffs into the lungs daily.  . traMADol (ULTRAM) 50 MG tablet Take 1 tablet (50 mg total) by mouth every 12 (twelve) hours as needed for moderate pain. 1 pill twice daily as needed for pain  . triamterene-hydrochlorothiazide (MAXZIDE-25) 37.5-25 MG tablet TAKE 1 TABLET DAILY FOR BP & FLUID  . Wheat Dextrin (BENEFIBER PO) Take 1 Dose by mouth daily.  Marland Kitchen zinc gluconate 50 MG tablet Take 50 mg by mouth daily.  Marland Kitchen  nitroGLYCERIN (NITROSTAT) 0.4 MG SL tablet Place 1 tablet (0.4 mg total) under the tongue every 5 (five) minutes as needed for chest pain.   No current facility-administered medications on file prior to visit.    Allergies  Allergen Reactions  . Ace Inhibitors Cough  . Lemon Oil Hives    Reaction to lemons  . Shellfish Allergy Swelling and Hives    PMHx:   Past Medical History:  Diagnosis Date  . Allergic rhinitis   . Arthritis   . Bronchiectasis ([place])   . Colon polyps   . Diverticulosis   . Emphysema of lung ([place])   . GERD (gastroesophageal reflux disease)   . HSV-1 (herpes simplex virus 1) infection   . Hypertension   . Pulmonary nodule 07/10/2017   CT 01/2017, CT 07/2017 - stable on follow up, no further follow up recommended.    Immunization History  Administered Date(s)  Administered  . DTaP 07/30/2011  . Influenza, High Dose Seasonal PF 06/07/2016, 06/25/2017, 09/22/2018, 07/19/2019  . PFIZER SARS-COV-2 Vaccination 10/05/2019, 10/25/2019  . Pneumococcal Conjugate-13 11/22/2015  . Pneumococcal Polysaccharide-23 05/11/2013  . Pneumococcal-Unspecified 01/07/2002  . Td 01/07/2002, 05/07/2012    Past Surgical History:  Procedure Laterality Date  . ABDOMINAL HYSTERECTOMY    . COLONOSCOPY    . DIRECT LARYNGOSCOPY N/A 10/21/2014   Procedure: DIRECT LARYNGOSCOPY;  Surgeon: Rozetta Nunnery, MD;  Location: [facility];  Service: ENT;  Laterality: N/A;  . EXCISION NASAL MASS Right 10/21/2014   Procedure: EXCISION NASOPHARYNGEAL MASS;  Surgeon: Rozetta Nunnery, MD;  Location: [facility];  Service: ENT;  Laterality: Right;  . EYE SURGERY     cataracts  . MASS EXCISION Right 10/21/2014   Procedure: RIGHT NECK NODE BIOPSY;  Surgeon: Rozetta Nunnery, MD;  Location: [facility];  Service: ENT;  Laterality: Right;    FHx:    Reviewed / unchanged  SHx:    Reviewed / unchanged   Systems Review:  Constitutional: Denies fever, chills, wt changes, headaches, insomnia, fatigue, night sweats, change in appetite. Eyes: Denies redness, blurred vision, diplopia, discharge, itchy, watery eyes.  ENT: Denies discharge, congestion, post nasal drip, epistaxis, sore throat, earache, hearing loss, dental pain, tinnitus, vertigo, sinus pain, snoring.  CV: Denies chest pain, palpitations, irregular heartbeat, syncope, dyspnea, diaphoresis, orthopnea, PND, claudication or edema. Respiratory: denies cough, dyspnea, DOE, pleurisy, hoarseness, laryngitis, wheezing.  Gastrointestinal: Denies dysphagia, odynophagia, heartburn, reflux, water brash, abdominal pain or cramps, nausea, vomiting, bloating, diarrhea, constipation, hematemesis, melena, hematochezia  or hemorrhoids. Genitourinary: Denies dysuria, frequency, urgency, nocturia,  hesitancy, discharge, hematuria or flank pain. Musculoskeletal: Denies arthralgias, myalgias, stiffness, jt. swelling, pain, limping or strain/sprain.  Skin: Denies pruritus, rash, hives, warts, acne, eczema or change in skin lesion(s). Neuro: No weakness, tremor, incoordination, spasms, paresthesia or pain. Psychiatric: Denies confusion, memory loss or sensory loss. Endo: Denies change in weight, skin or hair change.  Heme/Lymph: No excessive bleeding, bruising or enlarged lymph nodes.  Physical Exam  BP 136/76   Pulse 60   Temp 97.6 F (36.4 C)   Resp 16   Ht 5\' 6"  (1.676 m)   Wt 142 lb 12.8 oz (64.8 kg)   BMI 23.05 kg/m   Appears  well nourished, well groomed  and in no distress.  Eyes: PERRLA, EOMs, conjunctiva no swelling or erythema. Sinuses: No frontal/maxillary tenderness ENT/Mouth: EAC's clear, TM's nl w/o erythema, bulging. Nares clear w/o erythema, swelling, exudates. Oropharynx clear without erythema or exudates. Oral  hygiene is good. Tongue normal, non obstructing. Hearing intact.  Neck: Supple. Thyroid not palpable. Car 2+/2+ without bruits, nodes or JVD. Chest: Respirations nl with BS clear & equal w/o rales, rhonchi, wheezing or stridor.  Cor: Heart sounds normal w/ regular rate and rhythm without sig. murmurs, gallops, clicks or rubs. Peripheral pulses normal and equal  without edema.  Abdomen: Soft & bowel sounds normal. Non-tender w/o guarding, rebound, hernias, masses or organomegaly.  Lymphatics: Unremarkable.  Musculoskeletal: Full ROM all peripheral extremities, joint stability, 5/5 strength and normal gait.  Skin: Warm, dry without exposed rashes, lesions or ecchymosis apparent.  Neuro: Cranial nerves intact, reflexes equal bilaterally. Sensory-motor testing grossly intact. Tendon reflexes grossly intact.  Pysch: Alert & oriented x 3.  Insight and judgement nl & appropriate. No ideations.  Assessment and Plan:  1. Essential hypertension  - Continue  medication, monitor blood pressure at home.  - Continue DASH diet.  Reminder to go to the ER if any CP,  SOB, nausea, dizziness, severe HA, changes vision/speech.  - CBC with Differential/Platelet - COMPLETE METABOLIC PANEL WITH GFR - Magnesium - TSH  2. Hyperlipidemia, mixed  - Continue diet/meds, exercise,& lifestyle modifications.  - Continue monitor periodic cholesterol/liver & renal functions   - Lipid panel - TSH  3. Abnormal glucose  - Continue diet, exercise  - Lifestyle modifications.  - Monitor appropriate labs.  - Hemoglobin A1c - Insulin, random  4. Vitamin D deficiency  - Continue supplementation.  - VITAMIN D 25 Hydroxy   5. COPD, mild ([place])   6. Medication management  - CBC with Differential/Platelet - COMPLETE METABOLIC PANEL WITH GFR - Magnesium - Lipid panel - TSH - Hemoglobin A1c - Insulin, random - VITAMIN D 25 Hydroxy          Discussed  regular exercise, BP monitoring, weight control to achieve/maintain BMI less than 25 and discussed med and SE's. Recommended labs to assess and monitor clinical status with further disposition pending results of labs.  I discussed the assessment and treatment plan with the patient. The patient was provided an opportunity to ask questions and all were answered. The patient agreed with the plan and demonstrated an understanding of the instructions.  I provided over 30 minutes of exam, counseling, chart review and  complex critical decision making.   Kirtland Bouchard, MD

## 2020-06-01 ENCOUNTER — Ambulatory Visit: Payer: Medicare PPO | Admitting: Internal Medicine

## 2020-06-01 ENCOUNTER — Other Ambulatory Visit: Payer: Self-pay

## 2020-06-01 ENCOUNTER — Encounter: Payer: Self-pay | Admitting: Internal Medicine

## 2020-06-01 VITALS — BP 136/76 | HR 60 | Temp 97.6°F | Resp 16 | Ht 66.0 in | Wt 142.8 lb

## 2020-06-01 DIAGNOSIS — E782 Mixed hyperlipidemia: Secondary | ICD-10-CM | POA: Diagnosis not present

## 2020-06-01 DIAGNOSIS — R7309 Other abnormal glucose: Secondary | ICD-10-CM | POA: Diagnosis not present

## 2020-06-01 DIAGNOSIS — I10 Essential (primary) hypertension: Secondary | ICD-10-CM | POA: Diagnosis not present

## 2020-06-01 DIAGNOSIS — J449 Chronic obstructive pulmonary disease, unspecified: Secondary | ICD-10-CM

## 2020-06-01 DIAGNOSIS — E559 Vitamin D deficiency, unspecified: Secondary | ICD-10-CM

## 2020-06-01 DIAGNOSIS — Z79899 Other long term (current) drug therapy: Secondary | ICD-10-CM

## 2020-06-01 NOTE — Patient Instructions (Signed)

## 2020-06-02 LAB — COMPLETE METABOLIC PANEL WITH GFR
AG Ratio: 1.3 (calc) (ref 1.0–2.5)
ALT: 34 U/L — ABNORMAL HIGH (ref 6–29)
AST: 32 U/L (ref 10–35)
Albumin: 4 g/dL (ref 3.6–5.1)
Alkaline phosphatase (APISO): 81 U/L (ref 37–153)
BUN: 14 mg/dL (ref 7–25)
CO2: 30 mmol/L (ref 20–32)
Calcium: 10.4 mg/dL (ref 8.6–10.4)
Chloride: 103 mmol/L (ref 98–110)
Creat: 0.84 mg/dL (ref 0.60–0.93)
GFR, Est African American: 77 mL/min/{1.73_m2} (ref >=60)
GFR, Est Non African American: 66 mL/min/{1.73_m2} (ref >=60)
Globulin: 3.2 g/dL (calc) (ref 1.9–3.7)
Glucose, Bld: 88 mg/dL (ref 65–99)
Potassium: 4.2 mmol/L (ref 3.5–5.3)
Sodium: 139 mmol/L (ref 135–146)
Total Bilirubin: 0.3 mg/dL (ref 0.2–1.2)
Total Protein: 7.2 g/dL (ref 6.1–8.1)

## 2020-06-02 LAB — MAGNESIUM: Magnesium: 2.2 mg/dL (ref 1.5–2.5)

## 2020-06-02 LAB — CBC WITH DIFFERENTIAL/PLATELET
Absolute Monocytes: 486 cells/uL (ref 200–950)
Basophils Absolute: 32 cells/uL (ref 0–200)
Basophils Relative: 0.7 %
Eosinophils Absolute: 338 cells/uL (ref 15–500)
Eosinophils Relative: 7.5 %
HCT: 37.6 % (ref 35.0–45.0)
Hemoglobin: 12.1 g/dL (ref 11.7–15.5)
Lymphs Abs: 1566 cells/uL (ref 850–3900)
MCH: 28.4 pg (ref 27.0–33.0)
MCHC: 32.2 g/dL (ref 32.0–36.0)
MCV: 88.3 fL (ref 80.0–100.0)
MPV: 9.1 fL (ref 7.5–12.5)
Monocytes Relative: 10.8 %
Neutro Abs: 2079 cells/uL (ref 1500–7800)
Neutrophils Relative %: 46.2 %
Platelets: 364 10*3/uL (ref 140–400)
RBC: 4.26 10*6/uL (ref 3.80–5.10)
RDW: 14.1 % (ref 11.0–15.0)
Total Lymphocyte: 34.8 %
WBC: 4.5 10*3/uL (ref 3.8–10.8)

## 2020-06-02 LAB — VITAMIN D 25 HYDROXY (VIT D DEFICIENCY, FRACTURES): Vit D, 25-Hydroxy: 94 ng/mL (ref 30–100)

## 2020-06-02 LAB — LIPID PANEL
Cholesterol: 167 mg/dL (ref <200)
HDL: 72 mg/dL (ref >=50)
LDL Cholesterol (Calc): 81 mg/dL (calc)
Non-HDL Cholesterol (Calc): 95 mg/dL (calc) (ref <130)
Total CHOL/HDL Ratio: 2.3 (calc) (ref <5.0)
Triglycerides: 61 mg/dL (ref <150)

## 2020-06-02 LAB — INSULIN, RANDOM: Insulin: 5.8 u[IU]/mL

## 2020-06-02 LAB — HEMOGLOBIN A1C
Hgb A1c MFr Bld: 5.7 % of total Hgb — ABNORMAL HIGH (ref <5.7)
Mean Plasma Glucose: 117 (calc)
eAG (mmol/L): 6.5 (calc)

## 2020-06-02 LAB — TSH: TSH: 0.75 mIU/L (ref 0.40–4.50)

## 2020-06-02 NOTE — Progress Notes (Signed)
==========================================================  -    Chol = 167  and LDL = 81  - Much much better  -- Excellent   - Very low risk for Heart Attack  / Stroke =============================================================  - A1c = 5.1% - Still borderline    - Avoid Sweets, Candy & White Stuff   - Rice, Potatoes, Breads &  Pasta ==========================================================  -  All Else - CBC - Kidneys - Electrolytes - Liver - Magnesium & Thyroid    - all  Normal / OK ==========================================================

## 2020-06-16 ENCOUNTER — Other Ambulatory Visit: Payer: Self-pay

## 2020-06-16 ENCOUNTER — Encounter: Payer: Self-pay | Admitting: Internal Medicine

## 2020-06-16 ENCOUNTER — Ambulatory Visit (INDEPENDENT_AMBULATORY_CARE_PROVIDER_SITE_OTHER): Payer: Medicare PPO | Admitting: Internal Medicine

## 2020-06-16 VITALS — BP 138/74 | HR 80 | Ht 66.0 in | Wt 144.2 lb

## 2020-06-16 DIAGNOSIS — R931 Abnormal findings on diagnostic imaging of heart and coronary circulation: Secondary | ICD-10-CM | POA: Diagnosis not present

## 2020-06-16 MED ORDER — ROSUVASTATIN CALCIUM 5 MG PO TABS: 5.0000 mg | ORAL_TABLET | Freq: Every day | ORAL | 3 refills | Status: DC

## 2020-06-16 NOTE — Patient Instructions (Addendum)
Medication Instructions:  1.) change rosuvastatin (Crestor) to 5 mg EVERY DAY  *If you need a refill on your cardiac medications before your next appointment, please call your pharmacy*   Lab Work: none If you have labs (blood work) drawn today and your tests are completely normal, you will receive your results only by: Marland Kitchen MyChart Message (if you have MyChart) OR . A paper copy in the mail If you have any lab test that is abnormal or we need to change your treatment, we will call you to review the results.   Testing/Procedures: Limited echo - with Definity   Follow up with your physician will depend on test results.  Other Instructions Dr. Harrington Challenger will contact you with echo results.

## 2020-06-16 NOTE — Progress Notes (Signed)
Cardiology Office Note   Date:  06/16/2020   ID:  Catherine Parks 1940-12-25, MRN [id number]  PCP:  Unk Pinto, MD  Cardiologist:   Dorris Carnes, MD   Pt referred for abormal echo by Ann Lions   History of Present Illness: Catherine Parks is a 79 y.o. female with a history of HTN, HL, metabolic syn, Vit D Defic, COPD, ILD   She was seen by C Hilty in the past for CP  In 2019 had caardiolite in 2019 which showed no ischemia  LVEF 60%  Echo also showed normal LVEF     She is followed by Ann Lions in pulmonary for ILD   Echo ordered   (Sept 2021)  This reported LVEf 35 to 40%  RVEF is normal    The pt denies CP  She says her breathing is relatively stable   She does have some SOB with walking  No change   She denies lower extemity edema  No palptiations   Note CT done of chest   This showed mild coronary and aortic calcifications     Current Meds  Medication Sig  . acetaminophen (TYLENOL) 500 MG tablet Take 500 mg by mouth every 6 (six) hours as needed (pain).  Marland Kitchen amLODipine (NORVASC) 10 MG tablet TAKE 1 TABLET DAILY FOR BLOOD PRESSURE  . Ascorbic Acid (VITAMIN C) 100 MG tablet Take 100 mg by mouth daily.  Marland Kitchen aspirin EC 81 MG tablet Take 81 mg by mouth daily.    Marland Kitchen azelastine (ASTELIN) 0.1 % nasal spray   . cholecalciferol (VITAMIN D) 1000 units tablet Take 6,000 Units by mouth daily after lunch.   . cloNIDine (CATAPRES) 0.2 MG tablet 0.2 mg as needed.  . famotidine (PEPCID) 40 MG tablet Take 1 tablet every Day for Indigestion  & Heartburn  . Flaxseed, Linseed, (FLAX SEED OIL) 1000 MG CAPS Take 1,000 mg by mouth daily after lunch.   . halobetasol (ULTRAVATE) 0.05 % cream Apply topically 2 (two) times daily. Apply to Eczema Rash 2 x/day  . hydrocortisone 2.5 % cream   . Magnesium 500 MG TABS Take 500 mg by mouth.  . Naphazoline HCl (CLEAR EYES OP) Place 1 drop into both eyes daily.  . nitroGLYCERIN (NITROSTAT) 0.4 MG SL tablet Place 0.4 mg under the tongue every 5  (five) minutes as needed for chest pain.  Marland Kitchen OVER THE COUNTER MEDICATION Takes OTC eczema cream PRN.  . rosuvastatin (CRESTOR) 5 MG tablet Take 1 tab in the evening three days a week (MWF) for cholesterol to reduce heart attack and stroke risk.  . Tiotropium Bromide Monohydrate (SPIRIVA RESPIMAT) 1.25 MCG/ACT AERS Inhale 2 puffs into the lungs daily.  . traMADol (ULTRAM) 50 MG tablet Take 1 tablet (50 mg total) by mouth every 12 (twelve) hours as needed for moderate pain. 1 pill twice daily as needed for pain  . triamterene-hydrochlorothiazide (MAXZIDE-25) 37.5-25 MG tablet TAKE 1 TABLET DAILY FOR BP & FLUID  . Wheat Dextrin (BENEFIBER PO) Take 1 Dose by mouth daily.  Marland Kitchen zinc gluconate 50 MG tablet Take 50 mg by mouth daily.     Allergies:   Ace inhibitors, Lemon oil, Natural vegetable orange [psyllium], and Shellfish allergy   Past Medical History:  Diagnosis Date  . Allergic rhinitis   . Arthritis   . Bronchiectasis ([place])   . Colon polyps   . Diverticulosis   . Emphysema of lung ([place])   . GERD (gastroesophageal reflux disease)   .  HSV-1 (herpes simplex virus 1) infection   . Hypertension   . Pulmonary nodule 07/10/2017   CT 01/2017, CT 07/2017 - stable on follow up, no further follow up recommended.    Past Surgical History:  Procedure Laterality Date  . ABDOMINAL HYSTERECTOMY    . COLONOSCOPY    . DIRECT LARYNGOSCOPY N/A 10/21/2014   Procedure: DIRECT LARYNGOSCOPY;  Surgeon: Rozetta Nunnery, MD;  Location: [facility];  Service: ENT;  Laterality: N/A;  . EXCISION NASAL MASS Right 10/21/2014   Procedure: EXCISION NASOPHARYNGEAL MASS;  Surgeon: Rozetta Nunnery, MD;  Location: [facility];  Service: ENT;  Laterality: Right;  . EYE SURGERY     cataracts  . MASS EXCISION Right 10/21/2014   Procedure: RIGHT NECK NODE BIOPSY;  Surgeon: Rozetta Nunnery, MD;  Location: [facility];  Service: ENT;  Laterality: Right;     Social  History:  The patient  reports that she quit smoking about 5 years ago. Her smoking use included cigarettes. She started smoking about 63 years ago. She has a 58.00 pack-year smoking history. She has never used smokeless tobacco. She reports that she does not drink alcohol and does not use drugs.   Family History:  The patient's family history includes Breast cancer in her maternal grandmother and sister; Cancer in her brother and brother; Heart attack in her father; Hypertension in her mother; Other in her brother; Prostate cancer in her brother; Stroke in her mother.    ROS:  Please see the history of present illness. All other systems are reviewed and  Negative to the above problem except as noted.    PHYSICAL EXAM: VS:  BP 138/74   Pulse 80   Ht 5\' 6"  (1.676 m)   Wt 144 lb 3.2 oz (65.4 kg)   SpO2 99%   BMI 23.27 kg/m   GEN: Well nourished, well developed, in no acute distress  HEENT: normal  Neck: no JVD, carotid bruits Cardiac: RRR; no murmurs, rubs, or gallops,no LE  edema  Respiratory:  clear to auscultation bilaterally,   GI: soft, nontender, nondistended, + BS  No hepatomegaly  MS: no deformity Moving all extremities   Skin: warm and dry, no rash Neuro:  Strength and sensation are intact Psych: euthymic mood, full affect   EKG:  EKG is ordered today.  SR 62 bpm      Lipid Panel    Component Value Date/Time   CHOL 167 06/01/2020 1128   TRIG 61 06/01/2020 1128   HDL 72 06/01/2020 1128   CHOLHDL 2.3 06/01/2020 1128   VLDL 18 03/27/2017 1132   LDLCALC 81 06/01/2020 1128    ECHO  04/21/20 1. Left ventricular ejection fraction, by estimation, is 35 to 40%. The left ventricle has moderately decreased function. The left ventricle demonstrates global hypokinesis. Left ventricular diastolic parameters are consistent with Grade I diastolic dysfunction (impaired relaxation). The average left ventricular global longitudinal strain is -15.8 %. 2. Right ventricular systolic  function is mildly reduced. The right ventricular size is normal. 3. The mitral valve is grossly normal. Trivial mitral valve regurgitation. 4. The aortic valve is grossly normal. Aortic valve regurgitation is not visualized. No aortic stenosis is present.   Wt Readings from Last 3 Encounters:  06/16/20 144 lb 3.2 oz (65.4 kg)  06/01/20 142 lb 12.8 oz (64.8 kg)  05/01/20 144 lb 12.8 oz (65.7 kg)      ASSESSMENT AND PLAN:  1  Abnormal echo  I have reviewed recent echo   I am not convinced LVEF is is as low as reported above   RV function is normal I would recomm a limited echo with Definity to confirm   The pt denies symptoms of angina or signif SOB   Volume is good on exam   Keep on same regimen for now   2  HTN  BP is OK  Follow    3 ILD  Folllows with M Ramaswamy  4 HL  Started on crestor a few months ago  3x per wk   Tolerating   LDL 81  With plaquing I would increase to daily and follow up lpipds in 2 months     Current medicines are reviewed at length with the patient today.  The patient does not have concerns regarding medicines.  Signed, Dorris Carnes, MD  06/16/2020 9:09 AM    [address], [place], [place]  [address] Phone: [phone]; Fax: [phone]

## 2020-06-27 ENCOUNTER — Other Ambulatory Visit: Payer: Self-pay

## 2020-06-27 ENCOUNTER — Ambulatory Visit (HOSPITAL_COMMUNITY): Payer: Medicare PPO | Attending: Internal Medicine

## 2020-06-27 DIAGNOSIS — R931 Abnormal findings on diagnostic imaging of heart and coronary circulation: Secondary | ICD-10-CM | POA: Diagnosis not present

## 2020-06-27 LAB — ECHOCARDIOGRAM LIMITED
Area-P 1/2: 1.62 cm2
S' Lateral: 2.9 cm

## 2020-06-27 MED ORDER — PERFLUTREN LIPID MICROSPHERE
1.0000 mL | INTRAVENOUS | Status: AC | PRN
  Administered 2020-07-05: 2 mL via INTRAVENOUS

## 2020-07-05 ENCOUNTER — Encounter: Payer: Self-pay | Admitting: *Deleted

## 2020-07-05 DIAGNOSIS — R931 Abnormal findings on diagnostic imaging of heart and coronary circulation: Secondary | ICD-10-CM | POA: Diagnosis not present

## 2020-08-31 ENCOUNTER — Ambulatory Visit (INDEPENDENT_AMBULATORY_CARE_PROVIDER_SITE_OTHER): Payer: Medicare PPO | Admitting: Internal Medicine

## 2020-08-31 ENCOUNTER — Other Ambulatory Visit: Payer: Self-pay

## 2020-08-31 DIAGNOSIS — J849 Interstitial pulmonary disease, unspecified: Secondary | ICD-10-CM | POA: Diagnosis not present

## 2020-08-31 LAB — PULMONARY FUNCTION TEST
DL/VA % pred: 84 %
DL/VA: 3.4 ml/min/mmHg/L
DLCO cor % pred: 67 %
DLCO cor: 13.53 ml/min/mmHg
DLCO unc % pred: 67 %
DLCO unc: 13.53 ml/min/mmHg
FEF 25-75 Pre: 1 L/sec
FEF2575-%Pred-Pre: 66 %
FEV1-%Pred-Pre: 97 %
FEV1-Pre: 1.72 L
FEV1FVC-%Pred-Pre: 86 %
FEV6-%Pred-Pre: 119 %
FEV6-Pre: 2.62 L
FEV6FVC-%Pred-Pre: 103 %
FVC-%Pred-Pre: 115 %
FVC-Pre: 2.64 L
Pre FEV1/FVC ratio: 65 %
Pre FEV6/FVC Ratio: 99 %

## 2020-08-31 NOTE — Progress Notes (Signed)
Spirometry/DLCO performed today. 

## 2020-09-05 NOTE — Progress Notes (Signed)
FOLLOW UP 3 MONTH  Assessment:   Diagnoses and all orders for this visit:  Essential hypertension Continue medication; reduce losartan to 50 mg only if <120/80, otherwise take 100 mg daily  Monitor blood pressure at home; call if consistently over 130/80 Continue DASH diet.   Reminder to go to the ER if any CP, SOB, nausea, dizziness, severe HA, changes vision/speech, left arm numbness and tingling and jaw pain.  Hyperlipidemia, mixed Continue medications: Rosuvastatin 5mg  Discussed dietary and exercise modifications Low fat diet  Aortic atherosclerosis ([place]) Per CT 2020 Discussed LDL goal <100, statin; she requests defer until next visit, would consider  Control blood pressure, cholesterol, glucose, increase exercise.   ILD (interstitial lung disease) ([place]) Followed by Dr. Ashok Cordia Recent PFT's  COPD, mild ([place]) Followed by Dr. Ashok Cordia  Chronic seasonal allergic rhinitis Continue allergy pill  Bronchiectasis without acute exacerbation ([place]) Followed by Dr. Ashok Cordia  Gastroesophageal reflux disease, esophagitis presence not specified Well managed on current medications Discussed diet, avoiding triggers and other lifestyle changes  Diverticulosis Increase fiber and fluids, UTD on colonoscopy  Polyp of colon, unspecified part of colon, unspecified type Colonoscopy up to date  Arthritis Tylenol PRN  CKD (chronic kidney disease) stage 3, GFR 30-59 ml/min ([facility]) Increase fluids, avoid NSAIDS, monitor sugars, will monitor CMP/GFR  Vitamin D deficiency Continue supplementation Check vitamin D level  Other abnormal glucose Recent A1Cs at goal Discussed diet/exercise, weight management  Defer A1C; check CMP  Medication management CBC, CMP/GFR, magnesium   HSV-1 (herpes simplex virus 1) infection Antiviral PRN  Dyslipidemia Start low dose medication if remains elevated, declines today  LDL goal <100 Continue low cholesterol diet and exercise.  Check lipid panel.    Osteopenia - get dexa 06/2021, continue Vit D and Ca, weight bearing exercises  Insomnia Discussed good sleep hygiene, decrease stimulation prior to sleep Increase day time activity Avoid caffeine in evenings Consider Trazadone?    Over 40 minutes of face to face interview, exam, counseling, chart review and critical decision making was performed Future Appointments  Date Time Provider [place]  10/31/2020 10:30 AM Brand Males, MD [facility] None  12/05/2020 10:00 AM Liane Comber, NP [facility] None  03/12/2021 11:00 AM Garnet Sierras, NP [facility] None     Subjective:  Catherine Parks is a 79 y.o. female who presents for  3 month follow up.    Patient has been followed by Dr Ashok Cordia for COPD and a 6 mm LLL nodule by CT scans last in Nov 2018 which has remained stable in size since 1st discovered in May 2018. No further follow up recommended for nodule. Follows with pulm annually, getting low dose screening CT due to 58 pack year smoking hx, quit in 2016, ? PAH, emphysematous changes, ILD.   Patient has hx/o GERD stable by lifestyle & famotidine.  BMI is Body mass index is 22.92 kg/m., she has been working on diet, not exercising since covid 19 due to pulm conditoins and restrictions, [facility]  Wt Readings from Last 3 Encounters:  09/06/20 142 lb (64.4 kg)  06/16/20 144 lb 3.2 oz (65.4 kg)  06/01/20 142 lb 12.8 oz (64.8 kg)   She has aortic atherosclerosis per CT 03/2019 Had stress test and ECHO in 12/2017 following chest/jaw pain episode which were unremarkable -   Her blood pressure has been controlled at home (110-120s/70s), today their BP is BP: 128/74  She does not workout. She denies chest pain, shortness of breath,  dizziness.   She is not on cholesterol medication and denies myalgias. Her cholesterol is not at goal. She is doing plant based, no dairy, low fat poultry. The cholesterol last visit was:   Lab Results   Component Value Date   CHOL 167 06/01/2020   HDL 72 06/01/2020   LDLCALC 81 06/01/2020   TRIG 61 06/01/2020   CHOLHDL 2.3 06/01/2020    She has been working on diet and exercise for glucose management, and denies foot ulcerations, increased appetite, nausea, paresthesia of the feet, polydipsia, polyuria, visual disturbances, vomiting and weight loss. Last A1C in the office was:  Lab Results  Component Value Date   HGBA1C 5.7 (H) 06/01/2020   Last GFR: Lab Results  Component Value Date   GFRAA 77 06/01/2020   Patient is on Vitamin D supplement and at goal at recent check:    Lab Results  Component Value Date   VD25OH 94 06/01/2020      Medication Review: Current Outpatient Medications on File Prior to Visit  Medication Sig Dispense Refill   acetaminophen (TYLENOL) 500 MG tablet Take 500 mg by mouth every 6 (six) hours as needed (pain).     amLODipine (NORVASC) 10 MG tablet TAKE 1 TABLET DAILY FOR BLOOD PRESSURE 90 tablet 3   Ascorbic Acid (VITAMIN C) 100 MG tablet Take 100 mg by mouth daily.     aspirin EC 81 MG tablet Take 81 mg by mouth daily.     azelastine (ASTELIN) 0.1 % nasal spray      cholecalciferol (VITAMIN D) 1000 units tablet Take 6,000 Units by mouth daily after lunch.      cloNIDine (CATAPRES) 0.2 MG tablet 0.2 mg as needed.     famotidine (PEPCID) 40 MG tablet Take 1 tablet every Day for Indigestion  & Heartburn 90 tablet 3   Flaxseed, Linseed, (FLAX SEED OIL) 1000 MG CAPS Take 1,000 mg by mouth daily after lunch.      halobetasol (ULTRAVATE) 0.05 % cream Apply topically 2 (two) times daily. Apply to Eczema Rash 2 x/day 100 g 11   hydrocortisone 2.5 % cream      Magnesium 500 MG TABS Take 500 mg by mouth.     Naphazoline HCl (CLEAR EYES OP) Place 1 drop into both eyes daily.     nitroGLYCERIN (NITROSTAT) 0.4 MG SL tablet Place 0.4 mg under the tongue every 5 (five) minutes as needed for chest pain.     OVER THE COUNTER MEDICATION Takes OTC eczema  cream PRN.     rosuvastatin (CRESTOR) 5 MG tablet Take 1 tablet (5 mg total) by mouth daily. 90 tablet 3   Tiotropium Bromide Monohydrate (SPIRIVA RESPIMAT) 1.25 MCG/ACT AERS Inhale 2 puffs into the lungs daily. 4 g 0   traMADol (ULTRAM) 50 MG tablet Take 1 tablet (50 mg total) by mouth every 12 (twelve) hours as needed for moderate pain. 1 pill twice daily as needed for pain 60 tablet 0   triamterene-hydrochlorothiazide (MAXZIDE-25) 37.5-25 MG tablet TAKE 1 TABLET DAILY FOR BP & FLUID 90 tablet 3   Wheat Dextrin (BENEFIBER PO) Take 1 Dose by mouth daily.     zinc gluconate 50 MG tablet Take 50 mg by mouth daily.     No current facility-administered medications on file prior to visit.    Allergies  Allergen Reactions   Ace Inhibitors Cough   Lemon Oil Hives    Reaction to lemons   Natural Vegetable Orange [Psyllium]  oranges   Shellfish Allergy Swelling and Hives    Current Problems (verified) Patient Active Problem List   Diagnosis Date Noted   Former smoker 05/08/2018   Abnormal glucose 05/08/2018   Hyperlipidemia, mixed 05/08/2018   Dyslipidemia 01/28/2018   Abnormal EKG 12/22/2017   Aortic atherosclerosis ([place]) 07/10/2017   ILD (interstitial lung disease) ([place]) 09/27/2016   Arthritis 08/02/2016   Chronic seasonal allergic rhinitis 08/02/2016   COPD, mild ([place]) 06/07/2016   GERD  01/05/2015   CKD (chronic kidney disease) stage 2, GFR 60-89 ml/min 06/08/2014   Medication management 06/08/2014   Vitamin D deficiency 08/19/2013   Essential hypertension    HSV-1 (herpes simplex virus 1) infection    Diverticulosis    Colon polyps     Screening Tests Immunization History  Administered Date(s) Administered   DTaP 07/30/2011   Fluad Quad(high Dose 65+) 08/25/2020   Influenza, High Dose Seasonal PF 06/07/2016, 06/25/2017, 09/22/2018, 07/19/2019   PFIZER SARS-COV-2 Vaccination 10/05/2019, 10/25/2019, 08/25/2020   Pneumococcal  Conjugate-13 11/22/2015   Pneumococcal Polysaccharide-23 05/11/2013   Pneumococcal-Unspecified 01/07/2002   Td 01/07/2002, 05/07/2012   Preventative care: Last colonoscopy: 10/2010, diverticulosis, 10 year follow up per Dr. Sharlett Iles Mammogram: 04/2019 DEXA: 03/2016 L fem T-1.3, 04/2019 L fem T -1.4  CT lung 07/2017 - lung nodules stable, no further follow up recommended Former smoker quit 2016, 50+ pack year hx - low dose CT 03/2019, pulm is following  Prior vaccinations: TD or Tdap: 2013  Influenza: 09/2018 Pneumococcal: 2003, 2014 Prevnar13: 2017 Shingles/Zostavax: declines Covid 19: 2/2, 2021, pfizer   Names of Other Physician/Practitioners you currently use: 1. [facility] here for primary care 2. [facility], eye doctor, last visit 2020 3. Dr. Michell Heinrich, dentist, last visit 2021, q77m  Patient Care Team: Unk Pinto, MD as PCP - General (Internal Medicine) Warden Fillers, MD as Consulting Physician (Optometry) Croitoru, Dani Gobble, MD as Consulting Physician (Cardiology) Sable Feil, MD as Consulting Physician (Gastroenterology) Mickle Plumb, MD (Gynecology) Druscilla Brownie, MD as Consulting Physician (Dermatology) Volanda Napoleon, MD as Consulting Physician (Oncology) Rozetta Nunnery, MD as Consulting Physician (Otolaryngology)  SURGICAL HISTORY She  has a past surgical history that includes Eye surgery; Abdominal hysterectomy; Colonoscopy; Direct laryngoscopy (N/A, 10/21/2014); Mass excision (Right, 10/21/2014); and Excision nasal mass (Right, 10/21/2014). FAMILY HISTORY Her family history includes Breast cancer in her maternal grandmother and sister; Cancer in her brother and brother; Heart attack in her father; Hypertension in her mother; Other in her brother; Prostate cancer in her brother; Stroke in her mother. SOCIAL HISTORY She  reports that she quit smoking about 5 years ago. Her smoking use included  cigarettes. She started smoking about 63 years ago. She has a 58.00 pack-year smoking history. She has never used smokeless tobacco. She reports that she does not drink alcohol and does not use drugs.  Review of Systems  Constitutional: Negative for malaise/fatigue and weight loss.  HENT: Negative for hearing loss and tinnitus.   Eyes: Negative for blurred vision and double vision.  Respiratory: Negative for cough, sputum production, shortness of breath and wheezing.   Cardiovascular: Negative for chest pain, palpitations, orthopnea, claudication, leg swelling and PND.  Gastrointestinal: Negative for abdominal pain, blood in stool, constipation, diarrhea, heartburn, melena, nausea and vomiting.  Genitourinary: Negative.   Musculoskeletal: Negative for falls, joint pain and myalgias.  Skin: Negative for rash.  Neurological: Negative for dizziness, tingling, sensory change, weakness and headaches.  Endo/Heme/Allergies: Negative for polydipsia.  Psychiatric/Behavioral:  Negative.  Negative for depression, memory loss, substance abuse and suicidal ideas. The patient is not nervous/anxious and does not have insomnia.   All other systems reviewed and are negative.    Objective:     Today's Vitals   09/06/20 0857  BP: 128/74  Pulse: 71  Temp: 97.7 F (36.5 C)  SpO2: 98%  Weight: 142 lb (64.4 kg)  Height: 5\' 6"  (1.676 m)   Body mass index is 22.92 kg/m.  General appearance: alert, no distress, WD/WN, female HEENT: normocephalic, sclerae anicteric, TMs pearly, nares patent, no discharge or erythema, pharynx normal Oral cavity: MMM, no lesions Neck: supple, no lymphadenopathy, no thyromegaly, no masses Heart: RRR, normal S1, S2, no murmurs Lungs: CTA bilaterally, no wheezes, rhonchi, or rales Abdomen: +bs, soft, non tender, non distended, no masses, no hepatomegaly, no splenomegaly Musculoskeletal: nontender, no swelling, no obvious deformity Extremities: no edema, no cyanosis, no  clubbing Pulses: 2+ symmetric, upper and lower extremities, normal cap refill Neurological: alert, oriented x 3, CN2-12 intact, strength normal upper extremities and lower extremities, sensation normal throughout, DTRs 2+ throughout, no cerebellar signs, gait normal Psychiatric: normal affect, behavior normal, pleasant    Garnet Sierras, Laqueta Jean, DNP [facility] 09/06/2020  9:14 AM

## 2020-09-06 ENCOUNTER — Ambulatory Visit: Payer: Medicare PPO | Admitting: Adult Health Nurse Practitioner

## 2020-09-06 ENCOUNTER — Other Ambulatory Visit: Payer: Self-pay

## 2020-09-06 ENCOUNTER — Encounter: Payer: Self-pay | Admitting: Adult Health Nurse Practitioner

## 2020-09-06 VITALS — BP 128/74 | HR 71 | Temp 97.7°F | Ht 66.0 in | Wt 142.0 lb

## 2020-09-06 DIAGNOSIS — K219 Gastro-esophageal reflux disease without esophagitis: Secondary | ICD-10-CM | POA: Diagnosis not present

## 2020-09-06 DIAGNOSIS — B009 Herpesviral infection, unspecified: Secondary | ICD-10-CM

## 2020-09-06 DIAGNOSIS — R7309 Other abnormal glucose: Secondary | ICD-10-CM

## 2020-09-06 DIAGNOSIS — K635 Polyp of colon: Secondary | ICD-10-CM | POA: Diagnosis not present

## 2020-09-06 DIAGNOSIS — J302 Other seasonal allergic rhinitis: Secondary | ICD-10-CM | POA: Diagnosis not present

## 2020-09-06 DIAGNOSIS — I700 Atherosclerosis of aorta: Secondary | ICD-10-CM

## 2020-09-06 DIAGNOSIS — M1990 Unspecified osteoarthritis, unspecified site: Secondary | ICD-10-CM

## 2020-09-06 DIAGNOSIS — K5790 Diverticulosis of intestine, part unspecified, without perforation or abscess without bleeding: Secondary | ICD-10-CM | POA: Diagnosis not present

## 2020-09-06 DIAGNOSIS — J449 Chronic obstructive pulmonary disease, unspecified: Secondary | ICD-10-CM

## 2020-09-06 DIAGNOSIS — N1831 Chronic kidney disease, stage 3a: Secondary | ICD-10-CM

## 2020-09-06 DIAGNOSIS — E782 Mixed hyperlipidemia: Secondary | ICD-10-CM | POA: Diagnosis not present

## 2020-09-06 DIAGNOSIS — I10 Essential (primary) hypertension: Secondary | ICD-10-CM | POA: Diagnosis not present

## 2020-09-06 DIAGNOSIS — J849 Interstitial pulmonary disease, unspecified: Secondary | ICD-10-CM | POA: Diagnosis not present

## 2020-09-06 DIAGNOSIS — J479 Bronchiectasis, uncomplicated: Secondary | ICD-10-CM

## 2020-09-06 DIAGNOSIS — E559 Vitamin D deficiency, unspecified: Secondary | ICD-10-CM

## 2020-09-06 DIAGNOSIS — E785 Hyperlipidemia, unspecified: Secondary | ICD-10-CM

## 2020-09-06 NOTE — Patient Instructions (Addendum)
We will contact you in 1-3 days with your lab results via telephone.   GENERAL HEALTH GOALS  Know what a healthy weight is for you (roughly BMI <25) and aim to maintain this  Aim for 7+ servings of fruits and vegetables daily  70-80+ fluid ounces of water or unsweet tea for healthy kidneys  Limit to max 1 drink of alcohol per day; avoid smoking/tobacco  Limit animal fats in diet for cholesterol and heart health - choose grass fed whenever available  Avoid highly processed foods, and foods high in saturated/trans fats  Aim for low stress - take time to unwind and care for your mental health  Aim for 150 min of moderate intensity exercise weekly for heart health, and weights twice weekly for bone health  Aim for 7-9 hours of sleep daily

## 2020-09-07 LAB — LIPID PANEL
Cholesterol: 170 mg/dL (ref <200)
HDL: 73 mg/dL (ref >=50)
LDL Cholesterol (Calc): 82 mg/dL (calc)
Non-HDL Cholesterol (Calc): 97 mg/dL (calc) (ref <130)
Total CHOL/HDL Ratio: 2.3 (calc) (ref <5.0)
Triglycerides: 69 mg/dL (ref <150)

## 2020-09-07 LAB — COMPLETE METABOLIC PANEL WITH GFR
AG Ratio: 1.1 (calc) (ref 1.0–2.5)
ALT: 19 U/L (ref 6–29)
AST: 20 U/L (ref 10–35)
Albumin: 4 g/dL (ref 3.6–5.1)
Alkaline phosphatase (APISO): 78 U/L (ref 37–153)
BUN: 15 mg/dL (ref 7–25)
CO2: 29 mmol/L (ref 20–32)
Calcium: 10.6 mg/dL — ABNORMAL HIGH (ref 8.6–10.4)
Chloride: 101 mmol/L (ref 98–110)
Creat: 0.92 mg/dL (ref 0.60–0.93)
GFR, Est African American: 69 mL/min/{1.73_m2} (ref >=60)
GFR, Est Non African American: 59 mL/min/{1.73_m2} — ABNORMAL LOW (ref >=60)
Globulin: 3.7 g/dL (calc) (ref 1.9–3.7)
Glucose, Bld: 92 mg/dL (ref 65–99)
Potassium: 3.6 mmol/L (ref 3.5–5.3)
Sodium: 139 mmol/L (ref 135–146)
Total Bilirubin: 0.3 mg/dL (ref 0.2–1.2)
Total Protein: 7.7 g/dL (ref 6.1–8.1)

## 2020-09-07 LAB — CBC WITH DIFFERENTIAL/PLATELET
Absolute Monocytes: 564 cells/uL (ref 200–950)
Basophils Absolute: 29 cells/uL (ref 0–200)
Basophils Relative: 0.6 %
Eosinophils Absolute: 333 cells/uL (ref 15–500)
Eosinophils Relative: 6.8 %
HCT: 38 % (ref 35.0–45.0)
Hemoglobin: 12.5 g/dL (ref 11.7–15.5)
Lymphs Abs: 1627 cells/uL (ref 850–3900)
MCH: 28.7 pg (ref 27.0–33.0)
MCHC: 32.9 g/dL (ref 32.0–36.0)
MCV: 87.2 fL (ref 80.0–100.0)
MPV: 9 fL (ref 7.5–12.5)
Monocytes Relative: 11.5 %
Neutro Abs: 2347 cells/uL (ref 1500–7800)
Neutrophils Relative %: 47.9 %
Platelets: 373 10*3/uL (ref 140–400)
RBC: 4.36 10*6/uL (ref 3.80–5.10)
RDW: 13.7 % (ref 11.0–15.0)
Total Lymphocyte: 33.2 %
WBC: 4.9 10*3/uL (ref 3.8–10.8)

## 2020-10-31 ENCOUNTER — Other Ambulatory Visit: Payer: Self-pay

## 2020-10-31 ENCOUNTER — Ambulatory Visit: Payer: Medicare PPO | Admitting: Internal Medicine

## 2020-10-31 ENCOUNTER — Encounter: Payer: Self-pay | Admitting: Internal Medicine

## 2020-10-31 VITALS — BP 118/80 | HR 70 | Temp 97.1°F | Ht 66.0 in | Wt 148.1 lb

## 2020-10-31 DIAGNOSIS — R768 Other specified abnormal immunological findings in serum: Secondary | ICD-10-CM | POA: Diagnosis not present

## 2020-10-31 DIAGNOSIS — J849 Interstitial pulmonary disease, unspecified: Secondary | ICD-10-CM | POA: Diagnosis not present

## 2020-10-31 DIAGNOSIS — J439 Emphysema, unspecified: Secondary | ICD-10-CM

## 2020-10-31 NOTE — Patient Instructions (Addendum)
  ILD (interstitial lung disease) ([place])  - clinically stable over time. Reason not known   Plan  - will hold off antifibrotics for now given baseline GI issues and stability of lung functioin -Biopsy risky -Repeat high-resolution CT scan of the chest in 9 months supine and prone -Repeat spirometry and DLCO in 9 months  Elevated IgE level in 2017  Plan  -Per Dr. Fredderick Phenix allergist  Pulmonary emphysema, unspecified emphysema type ([place])  -Too bad Spiriva and albuterol have not helped you  Plan -Expectant follow-up   Shortness of breath  - due to all of above but glad you are better if you pulmonary rehab will not help anymore  Plan -Expectant follow-up  Solitary Pulmonary Nodule - stable 2018 -> 2021 august  Plan  - no further followup   Followup  -9 months on a 30-minute slot but after spirometry/DLCO and high-resolution CT chest supine and prone

## 2020-10-31 NOTE — Progress Notes (Signed)
'@Patient'  ID: Catherine Parks, female    DOB: January 05, 1941, 80 y.o.   MRN: [id number]  Chief Complaint  Patient presents with  . Follow-up    pft    Referring provider: Unk Pinto, MD  HPI  80 year old female former smoker followed for mild COPD with emphysema , lung nodule and ILD   TESTS: High resolution CT chest November 2017 showed ILD changes with  basilar predominant patchy subpleural reticulation, traction bronchiectasis and mild architectural distortion with mild honeycombing at the right base.  CT chest May 2018 showed no change in numerous tiny pulmonary nodules scattered throughout the lung 6 mm dating back to October 2017, stable ILD changes CT chest November 2018 showed stable pulmonary nodules felt to be benign with no specific follow-up noted.  Chronic changes of interstitial fibrosis without change.  PFT 06/13/17: FVC 2.89 L (121%) FEV1 1.91 L (103%) FEV1/FVC 0.66 FEF 25-75 1.00 L (63%) DLCO corrected 46% 12/27/16: FVC 2.81 L (117%) FEV1 1.85 L (99%) FEV1/FVC 0.66 FEF 25-75 0.93 L (58%) DLCO uncorrected 40% 09/27/16: FVC 2.92 L (121%) FEV1 2.01 L (180%) FEV1/FVC 0.69 FEF 25-75 1.20 L (74%) negative bronchodilator response TLC 5.05 L (94%) RV 85% ERV 501% DLCO corrected 50% (Hgb 12.7)  PFT Results Latest Ref Rng & Units 02/26/2019 06/13/2017 12/27/2016 09/27/2016  FVC-Pre L 2.75 2.89 2.81 2.92  FVC-Predicted Pre % 118 121 117 121  FVC-Post L 2.75 - - 2.95  FVC-Predicted Post % 118 - - 122  Pre FEV1/FVC % % 64 66 66 69  Post FEV1/FCV % % 65 - - 70  FEV1-Pre L 1.77 1.91 1.85 2.01  FEV1-Predicted Pre % 98 103 99 108  FEV1-Post L 1.79 - - 2.07  DLCO UNC% % 57 47 40 49  DLCO COR %Predicted % 68 51 48 60  TLC L 5.10 - - 5.05   TLC % Predicted % 95 - - 94  RV % Predicted % 99 - - 85     LABS 08/02/16 Alpha-1 antitrypsin: MM (153) IgG: 1276 IgA: 658 IgM: 76 IgE: 1652 RAST Panel: D farinae 1.75 / Cockroach 1.66 &other weak positives CRP: 0.1 ESR: 59 ANA: Positive DS DNA Ab: 181 Smith Ab: <0.2 RNP Ab: <0.2 SSA: <0.2 SSB: <0.2 Anti-CCP: <16  OV 02/26/19 - follow up mild COPD, ILD, pulmonary nodules Patient presents for follow-up visit today.  She was last seen by Rexene Edison on 12/24/2017.  She states that this is been a stable interval for her.  She has not seen any increase in her shortness of breath.  She states that she has been more inactive than usual due to current COVID pandemic.  She was going to the [facility] to walk 3 times per week but the [employer] has been closed for the last few months.  Patient tries to remain active inside her house and does lift some light weight dumbbells.  She does continue to get winded with heavy activity.  She is not on any inhalers.  Patient has known ILD changes on CT scan dating back to October 2017.  Previous CT chest showed stable changes.  Patient did have a PFT completed in office today and it is stable from last PFT.  Patient has had previous autoimmune work-up that was suggestive of lupus and was referred to rheumatology.  She is not on no suppression.   Patient has a history of pulmonary nodules on CT scan.  These nodules had shown no change and were consistent  with a benign etiology.  Patient's last CT chest was in 2018.  OV 01/19/2020  Subjective:  Patient ID: Catherine Parks, female , DOB: 09-23-1940 , age 76 y.o. , MRN: [id number] , ADDRESS: [place] [address]   01/19/2020 -   Chief Complaint  Patient presents with  . Follow-up    no concerns   Follow-up combination emphysema and ILD with pulmonary nodule. ANA positive and double-stranded DNA positive in 2017  HPI Catherine Parks 79 y.o. -returns for follow-up.  I  personally not seen her in few to several years.  She is seen the nurse practitioner in between.  Nurse practitioner notes her above.  Overall she tells me she is doing stable.  She is very minimal symptoms.  Symptom score is documented below.  For positive ANA and double-stranded DNA last year she is did see a rheumatologist.  She does not know who she saw but said she was reassured that she did not have any systemic evidence of connective tissue disease.  She is not taking any inhalers.  She feels that her shortness of breath on exertion is much improved.  She only notices it for stairs but even then it is minimal.  Her most recent test results were reviewed.  She is interested in serial monitoring and ensuring disease is stable.  She is concerned about interstitial lung disease.  She had a cardiac stress test in 2019 that was normal.  Her CT scan of the chest also shows enlarged pulmonary arteries.  She has grade 1 diastolic dysfunction on an echo.    OV 05/01/2020   Subjective:  Patient ID: Catherine Parks, female , DOB: 06/16/1941, age 86 y.o. years. , MRN: [id number],  ADDRESS: [place] [address] PCP  Unk Pinto, MD Providers : Treatment Team:  Attending Provider: Brand Males, MD   Chief Complaint  Patient presents with  . Follow-up    Pt states she has been doing okay since last visit. States that she still becomes SOB with exertion.    Follow-up combination emphysema and ILD with pulmonary nodule. ANA positive and double-stranded DNA positive 181 in 2017 Positive IGE - 1652 , and positive enviromental allergy test RAST - Nov 2017 Piror > 50 pack smoker  HPI Catherine Parks 80 y.o. -presents for follow-up for the above medical issues.  This is to discuss the results of investigation.  At this point in time she tells me that her dyspnea is somewhat worse.  No chest pain or cough.  In the past inhalers have not worked.  Her pulmonary function test  and CT scan of the chest shows stability in both ILD and emphysema and solitary pulmonary nodule.  She feels overall that her ILD has been there for a long time and it is been stable.  However she feels dyspnea is worse.  The interim finding is that she has new depressed ejection fraction of the left ventricle.  She has an appointment Dr. Dorris Carnes in June 16, 2020.  There is no associated chest pain.  There is no orthopnea there is no proximal nocturnal dyspnea.  She is reluctant about antifibrotic's because she has GI issues although she has marked 0 for vomiting and diarrhea.  Review of the labs indicate that in 2017 she was also significantly positive IgE and other allergens.  She tells me there is no mold in the house.  She has no pet birds.  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     SYMPTOM SCALE - ILD 01/19/2020  05/01/2020   O2 use ra ra  Shortness of Breath 0 -> 5 scale with 5 being worst (score 6 If unable to do)   At rest 0 0  Simple tasks - showers, clothes change, eating, shaving 0 1  Household (dishes, doing bed, laundry) 1 3  Shopping 1 3  Walking level at own pace 1 2  Walking up Stairs 2 3  Total (30-36) Dyspnea Score 5 12  How bad is your cough? 0 1  How bad is your fatigue 1 0  How bad is nausea 0 0  How bad is vomiting?  0 0  How bad is diarrhea? 0 0  How bad is anxiety? 0 2  How bad is depression 0 1   Simple office walk 185 feet x  3 laps goal with forehead probe 01/19/2020   O2 used ra  Number laps completed 3  Comments about pace x  Resting Pulse Ox/HR 100% and x/min  Final Pulse Ox/HR 98% and 98/min  Desaturated </= 88% no  Desaturated <= 3% points no  Got Tachycardic >/= 90/min yes  Symptoms at end of test x  Miscellaneous comments x   Results for NONNIE, PICKNEY (MRN [id number]) as of 05/01/2020 09:03  Ref. Range 09/27/2016 10:23 12/27/2016 08:50 06/13/2017 13:24 02/26/2019 08:48 04/20/2020 10:47   FVC-Pre Latest Units: L 2.92 2.81 2.89 2.75 2.78  FVC-%Pred-Pre Latest Units: % 121 117 121 118 121   Results for JAQUIA, BENEDICTO (MRN [id number]) as of 05/01/2020 09:03  Ref. Range 09/27/2016 10:23 12/27/2016 08:50 06/13/2017 13:24 02/26/2019 08:48 04/20/2020 10:47  DLCO unc Latest Units: ml/min/mmHg 13.33 10.95 12.85 11.55 10.81  DLCO unc % pred Latest Units: % 49 40 47 57 53      HRCT 04/24/20  CLINICAL DATA:  Interstitial lung disease. Increased shortness of breath with exertion the past few years.  EXAM: CT CHEST WITHOUT CONTRAST  TECHNIQUE: Multidetector CT imaging of the chest was performed following the standard protocol without intravenous contrast. High resolution imaging of the lungs, as well as inspiratory and expiratory imaging, was performed.  COMPARISON:  03/17/2019 07/17/2017.  FINDINGS: Cardiovascular: Atherosclerotic calcification of the aorta, aortic valve and coronary arteries. Pulmonic trunk is enlarged. Heart size normal. No pericardial effusion.  Mediastinum/Nodes: 1.2 cm low-attenuation left thyroid nodule. No follow-up recommended (ref: J Am Coll Radiol. 2015 Feb;12(2): 143-50).No pathologically enlarged mediastinal or axillary lymph nodes. Hilar regions are difficult to definitively evaluate without IV contrast but appear grossly unremarkable. Esophagus is slightly dilated.  Lungs/Pleura: Centrilobular and paraseptal emphysema. There is traction bronchiectasis, ground-glass and mild architectural distortion predominating in the lower lobes, as on prior exams. Minimal subpleural reticulation is seen in association. No honeycombing. 5 mm anterior left lower lobe nodule (3/120) is unchanged from 07/17/2017 and considered benign. No pleural fluid. Airway is unremarkable. Minimal air trapping.  Upper Abdomen: Visualized portions of the liver, adrenal glands, kidneys, spleen, pancreas, stomach and bowel are grossly unremarkable. No upper abdominal  adenopathy.  Musculoskeletal: No worrisome lytic or sclerotic lesions. T12 and L1 compression fractures are new. No worrisome lytic or sclerotic lesions.  IMPRESSION: 1. Pulmonary parenchymal pattern of fibrosis may be due to fibrotic nonspecific interstitial pneumonitis. Chronic hypersensitivity pneumonitis is not excluded. Findings are indeterminate for UIP per consensus guidelines: Diagnosis of Idiopathic Pulmonary Fibrosis: An Official ATS/ERS/JRS/ALAT Clinical Practice Guideline.  [place], Iss 5, [phone], May 17 2017. 2. Aortic atherosclerosis (ICD10-I70.0). Coronary artery calcification. 3. Enlarged pulmonic trunk, indicative of pulmonary arterial hypertension. 4.  Emphysema (ICD10-J43.9).   Electronically Signed   By: Lorin Picket M.D.   On: 04/24/2020 12:47   ECHO 04/21/20   Sonographer:  Jannett Celestine RDCS (AE)  Referring Phys: [id number] Artis Beggs   IMPRESSIONS    1. Left ventricular ejection fraction, by estimation, is 35 to 40%. The  left ventricle has moderately decreased function. The left ventricle  demonstrates global hypokinesis. Left ventricular diastolic parameters are  consistent with Grade I diastolic  dysfunction (impaired relaxation). The average left ventricular global  longitudinal strain is -15.8 %.  2. Right ventricular systolic function is mildly reduced. The right  ventricular size is normal.  3. The mitral valve is grossly normal. Trivial mitral valve  regurgitation.  4. The aortic valve is grossly normal. Aortic valve regurgitation is not  visualized. No aortic stenosis is present.     ROS - per HPI   OV 10/31/2020  Subjective:  Patient ID: Catherine Parks, female , DOB: 15-Oct-1940 , age 20 y.o. , MRN: [id number] , ADDRESS: [place] [address] PCP Unk Pinto, MD Patient Care Team: Unk Pinto, MD as PCP - General (Internal Medicine) Warden Fillers, MD  as Consulting Physician (Optometry) Croitoru, Dani Gobble, MD as Consulting Physician (Cardiology) Sable Feil, MD as Consulting Physician (Gastroenterology) Mickle Plumb, MD (Gynecology) Druscilla Brownie, MD as Consulting Physician (Dermatology) Volanda Napoleon, MD as Consulting Physician (Oncology) Rozetta Nunnery, MD as Consulting Physician (Otolaryngology)  This Provider for this visit: Treatment Team:  Attending Provider: Brand Males, MD    10/31/2020 -   Chief Complaint  Patient presents with  . Follow-up    Doing well    Follow-up combination emphysema and ILD with pulmonary nodule.  - indeterminate UIP; Last CT Aug 2021  - HP panel neg aug 2021  -ANA positive and double-stranded DNA +181 in November 2017 ANA positive and double-stranded DNA positive 181 in 2017 Positive IGE   - 1652 , and positive enviromental allergy test RAST - Nov 2017  - elevated 1411 - in aug 2021  - 2021 - sees Dr Fredderick Phenix  Piror > 50 pack smoker   HPI Catherine Parks 80 y.o. -presents for follow-up.  Last seen in summer 2021.  At the time her ILD was stable.  Etiology of the indeterminate pattern of ILD was not known.  I ordered a hypersensitive pneumonitis panel was negative.  Her IgE was elevated.  She has seen Dr. Fredderick Phenix in allergy according to history.  I do not know the details of this but apparently she is on as needed tablets.  At this point in time her dyspnea is improved.  Her walking desaturation test is stable.  Her pulmonary function test showed decline in FVC with stability/improvement in DLCO.  Overall she is stable.  She has not attended pulmonary rehabilitation a few years.  She found it beneficial in the past but she does not want to go again because she is feeling better and she plans to work out with her friends.  There was concern of low ejection fraction and I sent her to cardiology Dr. Harrington Challenger.  Apparently repeat images were done and I confirmed this on the  note.  The EF is actually normal.  Overall patient is satisfied with the quality of life.  She is  not interested in lung biopsy preemptive antifibrotic's.  She is interested in serial monitoring and supportive care at this point.  She will address antifibrotic's if she declines based on a risk-benefit analysis at that time.  For her associated emphysema last time I gave her Spiriva.  She tells me that it did not benefit her.  She is not interested in that or albuterol as needed.  No systemic manifestations of connective tissue disease so far      SYMPTOM SCALE - ILD 01/19/2020  05/01/2020  10/31/2020   O2 use ra ra ra  Shortness of Breath 0 -> 5 scale with 5 being worst (score 6 If unable to do)    At rest 0 0 1  Simple tasks - showers, clothes change, eating, shaving 0 1 1  Household (dishes, doing bed, laundry) '1 3 2  ' Shopping '1 3 1  ' Walking level at own pace '1 2 2  ' Walking up Stairs '2 3 2  ' Total (30-36) Dyspnea Score '5 12 9  ' How bad is your cough? 0 1 0  How bad is your fatigue 1 0 1  How bad is nausea 0 0 0  How bad is vomiting?  0 0 0  How bad is diarrhea? 0 0 0  How bad is anxiety? 0 2 0  How bad is depression 0 1 0   Simple office walk 185 feet x  3 laps goal with forehead probe 01/19/2020  10/31/2020   O2 used ra ra  Number laps completed 3 3  Comments about pace x avg pacd  Resting Pulse Ox/HR 100% and x/min 100% and70  Final Pulse Ox/HR 98% and 98/min 99% and 88/mn  Desaturated </= 88% no no  Desaturated <= 3% points no no  Got Tachycardic >/= 90/min yes no  Symptoms at end of test x No dyspnea  Miscellaneous comments x      PFT  PFT Results Latest Ref Rng & Units 08/31/2020 04/20/2020 02/26/2019 06/13/2017 12/27/2016 09/27/2016  FVC-Pre L 2.64 2.78 2.75 2.89 2.81 2.92  FVC-Predicted Pre % 115 121 118 121 117 121  FVC-Post L - - 2.75 - - 2.95  FVC-Predicted Post % - - 118 - - 122  Pre FEV1/FVC % % 65 64 64 66 66 69  Post FEV1/FCV % % - - 65 - - 70  FEV1-Pre L 1.72  1.78 1.77 1.91 1.85 2.01  FEV1-Predicted Pre % 97 100 98 103 99 108  FEV1-Post L - - 1.79 - - 2.07  DLCO uncorrected ml/min/mmHg 13.53 10.81 11.55 12.85 10.95 13.33  DLCO UNC% % 67 53 57 47 40 49  DLCO corrected ml/min/mmHg 13.53 10.92 11.66 12.56 - 13.63  DLCO COR %Predicted % 67 54 57 46 - 50  DLVA Predicted % 84 59 68 51 48 60  TLC L - - 5.10 - - 5.05  TLC % Predicted % - - 95 - - 94  RV % Predicted % - - 99 - - 85       has a past medical history of Allergic rhinitis, Arthritis, Bronchiectasis ([place]), Colon polyps, Diverticulosis, Emphysema of lung ([place]), GERD (gastroesophageal reflux disease), HSV-1 (herpes simplex virus 1) infection, Hypertension, and Pulmonary nodule (07/10/2017).   reports that she quit smoking about 6 years ago. Her smoking use included cigarettes. She started smoking about 64 years ago. She has a 58.00 pack-year smoking history. She has never used smokeless tobacco.  Past Surgical History:  Procedure Laterality Date  . ABDOMINAL  HYSTERECTOMY    . COLONOSCOPY    . DIRECT LARYNGOSCOPY N/A 10/21/2014   Procedure: DIRECT LARYNGOSCOPY;  Surgeon: Rozetta Nunnery, MD;  Location: [facility];  Service: ENT;  Laterality: N/A;  . EXCISION NASAL MASS Right 10/21/2014   Procedure: EXCISION NASOPHARYNGEAL MASS;  Surgeon: Rozetta Nunnery, MD;  Location: [facility];  Service: ENT;  Laterality: Right;  . EYE SURGERY     cataracts  . MASS EXCISION Right 10/21/2014   Procedure: RIGHT NECK NODE BIOPSY;  Surgeon: Rozetta Nunnery, MD;  Location: [facility];  Service: ENT;  Laterality: Right;    Allergies  Allergen Reactions  . Ace Inhibitors Cough  . Lemon Oil Hives    Reaction to lemons  . Natural Vegetable Orange [Psyllium]     oranges  . Shellfish Allergy Swelling and Hives    Immunization History  Administered Date(s) Administered  . DTaP 07/30/2011  . Fluad Quad(high Dose 65+) 08/25/2020  . Influenza, High  Dose Seasonal PF 06/07/2016, 06/25/2017, 09/22/2018, 07/19/2019  . PFIZER(Purple Top)SARS-COV-2 Vaccination 10/05/2019, 10/25/2019, 08/25/2020  . Pneumococcal Conjugate-13 11/22/2015  . Pneumococcal Polysaccharide-23 05/11/2013  . Pneumococcal-Unspecified 01/07/2002  . Td 01/07/2002, 05/07/2012    Family History  Problem Relation Age of Onset  . Heart attack Father   . Hypertension Mother   . Stroke Mother   . Breast cancer Sister   . Cancer Brother   . Prostate cancer Brother   . Breast cancer Maternal Grandmother   . Other Brother        Heart Issues  . Cancer Brother   . Lung disease Neg Hx   . Rheumatologic disease Neg Hx      Current Outpatient Medications:  .  acetaminophen (TYLENOL) 500 MG tablet, Take 500 mg by mouth every 6 (six) hours as needed (pain)., Disp: , Rfl:  .  amLODipine (NORVASC) 10 MG tablet, TAKE 1 TABLET DAILY FOR BLOOD PRESSURE, Disp: 90 tablet, Rfl: 3 .  Ascorbic Acid (VITAMIN C) 100 MG tablet, Take 100 mg by mouth daily., Disp: , Rfl:  .  aspirin EC 81 MG tablet, Take 81 mg by mouth daily., Disp: , Rfl:  .  azelastine (ASTELIN) 0.1 % nasal spray, , Disp: , Rfl:  .  cholecalciferol (VITAMIN D) 1000 units tablet, Take 6,000 Units by mouth daily after lunch. , Disp: , Rfl:  .  cloNIDine (CATAPRES) 0.2 MG tablet, 0.2 mg as needed., Disp: , Rfl:  .  famotidine (PEPCID) 40 MG tablet, Take 1 tablet every Day for Indigestion  & Heartburn, Disp: 90 tablet, Rfl: 3 .  Flaxseed, Linseed, (FLAX SEED OIL) 1000 MG CAPS, Take 1,000 mg by mouth daily after lunch. , Disp: , Rfl:  .  halobetasol (ULTRAVATE) 0.05 % cream, Apply topically 2 (two) times daily. Apply to Eczema Rash 2 x/day, Disp: 100 g, Rfl: 11 .  hydrocortisone 2.5 % cream, , Disp: , Rfl:  .  Magnesium 500 MG TABS, Take 500 mg by mouth., Disp: , Rfl:  .  Naphazoline HCl (CLEAR EYES OP), Place 1 drop into both eyes daily., Disp: , Rfl:  .  nitroGLYCERIN (NITROSTAT) 0.4 MG SL tablet, Place 0.4 mg under the  tongue every 5 (five) minutes as needed for chest pain., Disp: , Rfl:  .  OVER THE COUNTER MEDICATION, Takes OTC eczema cream PRN., Disp: , Rfl:  .  rosuvastatin (CRESTOR) 5 MG tablet, Take 1 tablet (5 mg total) by mouth daily., Disp: 90  tablet, Rfl: 3 .  traMADol (ULTRAM) 50 MG tablet, Take 1 tablet (50 mg total) by mouth every 12 (twelve) hours as needed for moderate pain. 1 pill twice daily as needed for pain, Disp: 60 tablet, Rfl: 0 .  triamterene-hydrochlorothiazide (MAXZIDE-25) 37.5-25 MG tablet, TAKE 1 TABLET DAILY FOR BP & FLUID, Disp: 90 tablet, Rfl: 3 .  Wheat Dextrin (BENEFIBER PO), Take 1 Dose by mouth daily., Disp: , Rfl:  .  zinc gluconate 50 MG tablet, Take 50 mg by mouth daily., Disp: , Rfl:       Objective:   Vitals:   10/31/20 1031  BP: 118/80  Pulse: 70  Temp: (!) 97.1 F (36.2 C)  TempSrc: Oral  SpO2: 100%  Weight: 148 lb 1.6 oz (67.2 kg)  Height: '5\' 6"'  (1.676 m)    Estimated body mass index is 23.9 kg/m as calculated from the following:   Height as of this encounter: '5\' 6"'  (1.676 m).   Weight as of this encounter: 148 lb 1.6 oz (67.2 kg).  '@WEIGHTCHANGE' @  [employer]   10/31/20 1031  Weight: 148 lb 1.6 oz (67.2 kg)     Physical Exam General: No distress. Looks well Neuro: Alert and Oriented x 3. GCS 15. Speech normal Psych: Pleasant Resp:  Barrel Chest - no.  Wheeze - no, Crackles - no, No overt respiratory distress CVS: Normal heart sounds. Murmurs - no Ext: Stigmata of Connective Tissue Disease - no HEENT: Normal upper airway. PEERL +. No post nasal drip        Assessment:       ICD-10-CM   1. ILD (interstitial lung disease) ([place])  J84.9 Pulmonary function test    CT Chest High Resolution       Plan:     Patient Instructions   ILD (interstitial lung disease) ([place])  - clinically stable over time. Reason not known   Plan  - will hold off antifibrotics for now given baseline GI issues and stability of lung functioin -Biopsy  risky -Repeat high-resolution CT scan of the chest in 9 months supine and prone -Repeat spirometry and DLCO in 9 months  Elevated IgE level in 2017  Plan  -Per Dr. Fredderick Phenix allergist  Pulmonary emphysema, unspecified emphysema type ([place])  -Too bad Spiriva and albuterol have not helped you  Plan -Expectant follow-up   Shortness of breath  - due to all of above but glad you are better if you pulmonary rehab will not help anymore  Plan -Expectant follow-up  Solitary Pulmonary Nodule - stable 2018 -> 2021 august  Plan  - no further followup   Followup  -9 months on a 30-minute slot but after spirometry/DLCO and high-resolution CT chest supine and prone     SIGNATURE    Dr. Brand Males, M.D., F.C.C.P,  Pulmonary and Critical Care Medicine Staff Physician, [place] Director - Interstitial Lung Disease  Program  [facility] [place], [place], [address]  Pager: [phone], If no answer or between  15:00h - 7:00h: call [phone] Telephone: [phone]  11:01 AM 10/31/2020

## 2020-12-04 NOTE — Progress Notes (Signed)
CPE AND FOLLOW UP  Assessment:   Diagnoses and all orders for this visit:  Encounter for annual wellness exam CT lung via pulm GI referral placed  Essential hypertension Continue medication; labile; well controlled at home; take clonidine PRN Monitor blood pressure at home; call if consistently over 130/80 Continue DASH diet.   Reminder to go to the ER if any CP, SOB, nausea, dizziness, severe HA, changes vision/speech, left arm numbness and tingling and jaw pain.  Aortic atherosclerosis ([place]) Per CT 2020 LDL goal <100, continue statin  Control blood pressure, cholesterol, glucose, increase exercise.   ILD (interstitial lung disease) ([place]) Followed by Dr. Ashok Cordia  COPD, mild [facility]) Followed by Dr. Ashok Cordia  Chronic seasonal allergic rhinitis Continue allergy pill  Bronchiectasis without acute exacerbation ([place]) Followed by Dr. Ashok Cordia  Gastroesophageal reflux disease, esophagitis presence not specified Well managed on current medications Discussed diet, avoiding triggers and other lifestyle changes  Diverticulosis Increase fiber and fluids  Arthritis Tylenol PRN  CKD (chronic kidney disease) stage 3, GFR 30-59 ml/min (HCC) Increase fluids, avoid NSAIDS, monitor sugars, will monitor CMP/GFR  Vitamin D deficiency Continue supplementation Check vitamin D level  Other abnormal glucose Recent A1Cs at goal Discussed diet/exercise, weight management  Defer A1C; check CMP  Medication management CBC, CMP/GFR, magnesium   HSV-1 (herpes simplex virus 1) infection Antiviral PRN  Dyslipidemia Continue rosuvastatin  Continue low cholesterol diet and exercise.  Check lipid panel.   Osteopenia - get dexa 06/2021 (order placed), continue Vit D and Ca, weight bearing exercises  Abdominal pain/ Polyp of colon, unspecified part of colon, unspecified type Get on fiber supplement, increase water, increase exercise Add miralax;  Due for colonoscopy; referral placed to  GI Follow up if not improving  Lumbar back pain  Persistent intermittent, without radicular sx; check xray  Tylenol PRN, heat/ice, back exercises  Orders Placed This Encounter  Procedures  . DG Bone Density  . DG Lumbar Spine Complete  . CBC with Differential/Platelet  . COMPLETE METABOLIC PANEL WITH GFR  . Magnesium  . Lipid panel  . TSH  . Hemoglobin A1c  . VITAMIN D 25 Hydroxy (Vit-D Deficiency, Fractures)  . Microalbumin / creatinine urine ratio  . Urinalysis, Routine w reflex microscopic  . Ambulatory referral to Gastroenterology  . EKG 12-Lead    Over 40 minutes of exam, counseling, chart review and critical decision making was performed Future Appointments  Date Time Provider [place]  03/12/2021 11:00 AM Garnet Sierras, NP [facility] None  07/24/2021 10:00 AM [facility] 1 [facility]  12/05/2021  3:00 PM Liane Comber, NP [facility] None     Plan:   During the course of the visit the patient was educated and counseled about appropriate screening and preventive services including:    Pneumococcal vaccine   Prevnar 13  Influenza vaccine  Td vaccine  Screening electrocardiogram  Bone densitometry screening  Colorectal cancer screening  Diabetes screening  Glaucoma screening  Nutrition counseling   Advanced directives: requested   Subjective:  Catherine Parks is a 80 y.o. female who presents for CPE. She has Essential hypertension; HSV-1 (herpes simplex virus 1) infection; Diverticulosis; Colon polyps; Vitamin D deficiency; CKD (chronic kidney disease) stage 2, GFR 60-89 ml/min; Medication management; GERD ; COPD, mild ([place]); Arthritis; Chronic seasonal allergic rhinitis; ILD (interstitial lung disease) ([place]); Aortic atherosclerosis ([place]); Abnormal EKG; Former smoker; Abnormal glucose (prediabetes); and Hyperlipidemia, mixed on their problem list.  She is widowed, 2 children, 4 grandchildren. She is a retired  high school Pharmacist, hospital.    Patient has been followed by Dr Ashok Cordia for COPD and a 6 mm LLL nodule by CT scans which has remained stable in size since 1st discovered in May 2018.  Follows with pulm annually, getting low dose screening CT due to 58 pack year smoking hx, quit in 2016. Last in 04/24/2020 showed ? PAH, emphysematous changes, ILD. ECHO 06/2020 was benign without evidence of PAH.    Patient has hx/o GERD stable by lifestyle & famotidine.  She has hx of colon polyps, diverticulosis, last colonoscopy Dr. Sharlett Iles 10/2010; she reports has been having more constipation (intermittent, normal calibur), lower abdominal discomfort, reports episode of blood on stool last week that has resolved.   BMI is Body mass index is 23.31 kg/m., she has been working on diet, not exercising since covid 19 due to pulm conditions and restrictions, [facility]  Wt Readings from Last 3 Encounters:  12/05/20 144 lb 6.4 oz (65.5 kg)  10/31/20 148 lb 1.6 oz (67.2 kg)  09/06/20 142 lb (64.4 kg)   She has aortic atherosclerosis per CT 03/2019 Had stress test and ECHO in 12/2017 following chest/jaw pain episode which were unremarkable -    Her blood pressure has been controlled at home (110-120s/70s), today their BP is BP: (!) 146/82, similar by recheck, will take clonidine 0.2 mg PRN, typically once a week for labile BPs She does not workout. She denies chest pain, shortness of breath, dizziness.   She is on cholesterol medication (rosuvastatin 5 mg daily) and denies myalgias. Her cholesterol is not at goal. She is doing plant based, no dairy, low fat poultry. The cholesterol last visit was:   Lab Results  Component Value Date   CHOL 170 09/06/2020   HDL 73 09/06/2020   LDLCALC 82 09/06/2020   TRIG 69 09/06/2020   CHOLHDL 2.3 09/06/2020    She has been working on diet and exercise for glucose management, and denies foot ulcerations, increased appetite, nausea, paresthesia of the feet, polydipsia,  polyuria, visual disturbances, vomiting and weight loss.  Last A1C in the office was:  Lab Results  Component Value Date   HGBA1C 5.7 (H) 06/01/2020   Last GFR: Lab Results  Component Value Date   GFRAA 69 09/06/2020   Patient is on Vitamin D supplement and at goal at recent check:    Lab Results  Component Value Date   VD25OH 94 06/01/2020        Medication Review: Current Outpatient Medications on File Prior to Visit  Medication Sig Dispense Refill  . acetaminophen (TYLENOL) 500 MG tablet Take 500 mg by mouth every 6 (six) hours as needed (pain).    Marland Kitchen amLODipine (NORVASC) 10 MG tablet TAKE 1 TABLET DAILY FOR BLOOD PRESSURE 90 tablet 3  . Ascorbic Acid (VITAMIN C) 100 MG tablet Take 100 mg by mouth daily.    Marland Kitchen aspirin EC 81 MG tablet Take 81 mg by mouth daily.    Marland Kitchen azelastine (ASTELIN) 0.1 % nasal spray     . cholecalciferol (VITAMIN D) 1000 units tablet Take 6,000 Units by mouth daily after lunch.     . cloNIDine (CATAPRES) 0.2 MG tablet 0.2 mg as needed.    . famotidine (PEPCID) 40 MG tablet Take 1 tablet every Day for Indigestion  & Heartburn 90 tablet 3  . halobetasol (ULTRAVATE) 0.05 % cream Apply topically 2 (two) times daily. Apply to Eczema Rash 2 x/day 100 g  11  . hydrocortisone 2.5 % cream     . Magnesium 500 MG TABS Take 500 mg by mouth.    . Naphazoline HCl (CLEAR EYES OP) Place 1 drop into both eyes daily.    . nitroGLYCERIN (NITROSTAT) 0.4 MG SL tablet Place 0.4 mg under the tongue every 5 (five) minutes as needed for chest pain.    Marland Kitchen OVER THE COUNTER MEDICATION Takes OTC eczema cream PRN.    . rosuvastatin (CRESTOR) 5 MG tablet Take 1 tablet (5 mg total) by mouth daily. 90 tablet 3  . traMADol (ULTRAM) 50 MG tablet Take 1 tablet (50 mg total) by mouth every 12 (twelve) hours as needed for moderate pain. 1 pill twice daily as needed for pain 60 tablet 0  . triamterene-hydrochlorothiazide (MAXZIDE-25) 37.5-25 MG tablet TAKE 1 TABLET DAILY FOR BP & FLUID 90 tablet 3   . Wheat Dextrin (BENEFIBER PO) Take 1 Dose by mouth daily.    Marland Kitchen zinc gluconate 50 MG tablet Take 50 mg by mouth daily.    . Flaxseed, Linseed, (FLAX SEED OIL) 1000 MG CAPS Take 1,000 mg by mouth daily after lunch.  (Patient not taking: Reported on 12/05/2020)     No current facility-administered medications on file prior to visit.    Allergies  Allergen Reactions  . Ace Inhibitors Cough  . Lemon Oil Hives    Reaction to lemons  . Natural Vegetable Orange [Psyllium]     oranges  . Shellfish Allergy Swelling and Hives    Current Problems (verified) Patient Active Problem List   Diagnosis Date Noted  . Former smoker 05/08/2018  . Abnormal glucose (prediabetes) 05/08/2018  . Hyperlipidemia, mixed 05/08/2018  . Abnormal EKG 12/22/2017  . Aortic atherosclerosis ([place]) 07/10/2017  . ILD (interstitial lung disease) ([place]) 09/27/2016  . Arthritis 08/02/2016  . Chronic seasonal allergic rhinitis 08/02/2016  . COPD, mild ([place]) 06/07/2016  . GERD  01/05/2015  . CKD (chronic kidney disease) stage 2, GFR 60-89 ml/min 06/08/2014  . Medication management 06/08/2014  . Vitamin D deficiency 08/19/2013  . Essential hypertension   . HSV-1 (herpes simplex virus 1) infection   . Diverticulosis   . Colon polyps     Screening Tests Immunization History  Administered Date(s) Administered  . DTaP 07/30/2011  . Fluad Quad(high Dose 65+) 08/25/2020  . Influenza, High Dose Seasonal PF 06/07/2016, 06/25/2017, 09/22/2018, 07/19/2019  . PFIZER(Purple Top)SARS-COV-2 Vaccination 10/05/2019, 10/25/2019, 08/25/2020  . Pneumococcal Conjugate-13 11/22/2015  . Pneumococcal Polysaccharide-23 05/11/2013  . Pneumococcal-Unspecified 01/07/2002  . Td 01/07/2002, 05/07/2012    Preventative care: Last colonoscopy: 10/2010, diverticulosis, 10 year follow up per Dr. Sharlett Iles Mammogram: 04/2020 DEXA: 03/2016 L fem T-1.3, 04/2019 L fem T -1.4, ordered to schedule   CT lung 04/2020 Former smoker quit 2016, 50+  pack year hx - pulm is following  Prior vaccinations: TD or Tdap: 2013  Influenza: 08/2020 Pneumococcal: 2003, 2014 Prevnar13: 2017 Shingles/Zostavax: declines Covid 19: 3/3, 2021, pfizer   Names of Other Physician/Practitioners you currently use: 1. [facility] here for primary care 2. [facility], eye doctor, last visit 2021, looking for new provider 3. Dr. Michell Heinrich, dentist, last visit 2022, q35m  Patient Care Team: Unk Pinto, MD as PCP - General (Internal Medicine) Warden Fillers, MD as Consulting Physician (Optometry) Croitoru, Dani Gobble, MD as Consulting Physician (Cardiology) Sable Feil, MD as Consulting Physician (Gastroenterology) Mickle Plumb, MD (Gynecology) Druscilla Brownie, MD as Consulting Physician (Dermatology) Volanda Napoleon, MD as Consulting  Physician (Oncology) Rozetta Nunnery, MD as Consulting Physician (Otolaryngology)  SURGICAL HISTORY She  has a past surgical history that includes Eye surgery; Abdominal hysterectomy; Colonoscopy; Direct laryngoscopy (N/A, 10/21/2014); Mass excision (Right, 10/21/2014); Excision nasal mass (Right, 10/21/2014); and Cesarean section (1969). FAMILY HISTORY Her family history includes Breast cancer in her maternal grandmother and sister; Cancer in her brother and brother; Heart attack in her father; Hypertension in her mother; Other in her brother; Prostate cancer in her brother; Stroke in her mother. SOCIAL HISTORY She  reports that she quit smoking about 6 years ago. Her smoking use included cigarettes. She started smoking about 64 years ago. She has a 58.00 pack-year smoking history. She has never used smokeless tobacco. She reports that she does not drink alcohol and does not use drugs.    Review of Systems  Constitutional: Negative for malaise/fatigue and weight loss.  HENT: Negative for hearing loss and tinnitus.   Eyes: Negative for blurred vision and double  vision.  Respiratory: Negative for cough, sputum production, shortness of breath and wheezing.   Cardiovascular: Negative for chest pain, palpitations, orthopnea, claudication, leg swelling and PND.  Gastrointestinal: Positive for abdominal pain (mild lower abdominal discomfort, intermittent) and blood in stool (single episode last week ). Negative for constipation (intermittent, very hard stools), diarrhea, heartburn, melena, nausea and vomiting.  Genitourinary: Negative.   Musculoskeletal: Negative for falls, joint pain and myalgias.  Skin: Negative for rash.  Neurological: Negative for dizziness, tingling, sensory change, weakness and headaches.  Endo/Heme/Allergies: Negative for polydipsia.  Psychiatric/Behavioral: Negative.  Negative for depression, memory loss, substance abuse and suicidal ideas. The patient is not nervous/anxious and does not have insomnia.   All other systems reviewed and are negative.    Objective:     Today's Vitals   12/05/20 0959  BP: (!) 146/82  Pulse: 64  Temp: (!) 97.1 F (36.2 C)  SpO2: 99%  Weight: 144 lb 6.4 oz (65.5 kg)  Height: 5\' 6"  (1.676 m)   Body mass index is 23.31 kg/m.  General appearance: alert, no distress, WD/WN, female HEENT: normocephalic, sclerae anicteric, TMs pearly, nares patent, no discharge or erythema, pharynx normal Oral cavity: MMM, no lesions Neck: supple, no lymphadenopathy, no thyromegaly, no masses Heart: RRR, normal S1, S2, no murmurs Lungs: CTA bilaterally, no wheezes, rhonchi, or rales Abdomen: +bs, soft, non tender, non distended, no masses, no hepatomegaly, no splenomegaly Musculoskeletal: nontender, no swelling, no obvious deformity. Intact lumbar ROM without pain, neg straight leg raise, non-tender. L hip with mildly decreased external rotation without pain or crepitus.  Extremities: no edema, no cyanosis, no clubbing Pulses: 2+ symmetric, upper and lower extremities, normal cap refill Neurological: alert,  oriented x 3, CN2-12 intact, strength normal upper extremities and lower extremities, sensation normal throughout, DTRs 2+ throughout, no cerebellar signs, gait normal Psychiatric: normal affect, behavior normal, pleasant   EKG: NSR, NSCPT  Izora Ribas, NP   12/05/2020

## 2020-12-05 ENCOUNTER — Encounter: Payer: Self-pay | Admitting: Adult Health

## 2020-12-05 ENCOUNTER — Ambulatory Visit: Payer: Medicare PPO | Admitting: Adult Health

## 2020-12-05 ENCOUNTER — Other Ambulatory Visit: Payer: Self-pay

## 2020-12-05 VITALS — BP 146/82 | HR 64 | Temp 97.1°F | Ht 66.0 in | Wt 144.4 lb

## 2020-12-05 DIAGNOSIS — J849 Interstitial pulmonary disease, unspecified: Secondary | ICD-10-CM

## 2020-12-05 DIAGNOSIS — Z1389 Encounter for screening for other disorder: Secondary | ICD-10-CM

## 2020-12-05 DIAGNOSIS — E782 Mixed hyperlipidemia: Secondary | ICD-10-CM

## 2020-12-05 DIAGNOSIS — E559 Vitamin D deficiency, unspecified: Secondary | ICD-10-CM

## 2020-12-05 DIAGNOSIS — I700 Atherosclerosis of aorta: Secondary | ICD-10-CM

## 2020-12-05 DIAGNOSIS — Z6823 Body mass index (BMI) 23.0-23.9, adult: Secondary | ICD-10-CM

## 2020-12-05 DIAGNOSIS — Z1329 Encounter for screening for other suspected endocrine disorder: Secondary | ICD-10-CM

## 2020-12-05 DIAGNOSIS — K635 Polyp of colon: Secondary | ICD-10-CM

## 2020-12-05 DIAGNOSIS — M545 Low back pain, unspecified: Secondary | ICD-10-CM

## 2020-12-05 DIAGNOSIS — B009 Herpesviral infection, unspecified: Secondary | ICD-10-CM

## 2020-12-05 DIAGNOSIS — I10 Essential (primary) hypertension: Secondary | ICD-10-CM | POA: Diagnosis not present

## 2020-12-05 DIAGNOSIS — Z79899 Other long term (current) drug therapy: Secondary | ICD-10-CM

## 2020-12-05 DIAGNOSIS — R7309 Other abnormal glucose: Secondary | ICD-10-CM

## 2020-12-05 DIAGNOSIS — N182 Chronic kidney disease, stage 2 (mild): Secondary | ICD-10-CM

## 2020-12-05 DIAGNOSIS — Z87891 Personal history of nicotine dependence: Secondary | ICD-10-CM

## 2020-12-05 DIAGNOSIS — Z131 Encounter for screening for diabetes mellitus: Secondary | ICD-10-CM | POA: Diagnosis not present

## 2020-12-05 DIAGNOSIS — Z136 Encounter for screening for cardiovascular disorders: Secondary | ICD-10-CM | POA: Diagnosis not present

## 2020-12-05 DIAGNOSIS — J449 Chronic obstructive pulmonary disease, unspecified: Secondary | ICD-10-CM

## 2020-12-05 DIAGNOSIS — J302 Other seasonal allergic rhinitis: Secondary | ICD-10-CM

## 2020-12-05 DIAGNOSIS — Z0000 Encounter for general adult medical examination without abnormal findings: Secondary | ICD-10-CM | POA: Diagnosis not present

## 2020-12-05 DIAGNOSIS — K5790 Diverticulosis of intestine, part unspecified, without perforation or abscess without bleeding: Secondary | ICD-10-CM

## 2020-12-05 DIAGNOSIS — K5900 Constipation, unspecified: Secondary | ICD-10-CM

## 2020-12-05 DIAGNOSIS — K219 Gastro-esophageal reflux disease without esophagitis: Secondary | ICD-10-CM

## 2020-12-05 DIAGNOSIS — M8580 Other specified disorders of bone density and structure, unspecified site: Secondary | ICD-10-CM | POA: Insufficient documentation

## 2020-12-05 NOTE — Patient Instructions (Signed)
[name] , Thank you for taking time to come for your Annual Wellness Visit. I appreciate your ongoing commitment to your health goals. Please review the following plan we discussed and let me know if I can assist you in the future.   These are the goals we discussed: Goals    . Blood Pressure < 140/90    . Exercise 150 min/wk Moderate Activity    . LDL CALC < 100       This is a list of the screening recommended for you and due dates:  Health Maintenance  Topic Date Due  . COVID-19 Vaccine (4 - Booster for Pfizer series) 02/23/2021  . Mammogram  04/26/2021  . Tetanus Vaccine  05/07/2022  . Flu Shot  Completed  . DEXA scan (bone density measurement)  Completed  . Pneumonia vaccines  Completed  . HPV Vaccine  Aged Out   Please add daily or every other daily miralax in addition to daily fiber  Increase fiber in diet, increase fluids, and exercise  Please get back xray at [address] [facility] center - no appointment needed, just walk in during business hours   Know what a healthy weight is for you (roughly BMI <25) and aim to maintain this  Aim for 7+ servings of fruits and vegetables daily  65-80+ fluid ounces of water or unsweet tea for healthy kidneys  Limit to max 1 drink of alcohol per day; avoid smoking/tobacco  Limit animal fats in diet for cholesterol and heart health - choose grass fed whenever available  Avoid highly processed foods, and foods high in saturated/trans fats  Aim for low stress - take time to unwind and care for your mental health  Aim for 150 min of moderate intensity exercise weekly for heart health, and weights twice weekly for bone health  Aim for 7-9 hours of sleep daily     Constipation, Adult Constipation is when a person has fewer than three bowel movements in a week, has difficulty having a bowel movement, or has stools (feces) that are dry, hard, or larger than normal. Constipation may be caused by an underlying condition. It  may become worse with age if a person takes certain medicines and does not take in enough fluids. Follow these instructions at home: Eating and drinking  Eat foods that have a lot of fiber, such as beans, whole grains, and fresh fruits and vegetables.  Limit foods that are low in fiber and high in fat and processed sugars, such as fried or sweet foods. These include french fries, hamburgers, cookies, candies, and soda.  Drink enough fluid to keep your urine pale yellow.   General instructions  Exercise regularly or as told by your health care provider. Try to do 150 minutes of moderate exercise each week.  Use the bathroom when you have the urge to go. Do not hold it in.  Take over-the-counter and prescription medicines only as told by your health care provider. This includes any fiber supplements.  During bowel movements: ? Practice deep breathing while relaxing the lower abdomen. ? Practice pelvic floor relaxation.  Watch your condition for any changes. Let your health care provider know about them.  Keep all follow-up visits as told by your health care provider. This is important. Contact a health care provider if:  You have pain that gets worse.  You have a fever.  You do not have a bowel movement after 4 days.  You vomit.  You are not  hungry or you lose weight.  You are bleeding from the opening between the buttocks (anus).  You have thin, pencil-like stools. Get help right away if:  You have a fever and your symptoms suddenly get worse.  You leak stool or have blood in your stool.  Your abdomen is bloated.  You have severe pain in your abdomen.  You feel dizzy or you faint. Summary  Constipation is when a person has fewer than three bowel movements in a week, has difficulty having a bowel movement, or has stools (feces) that are dry, hard, or larger than normal.  Eat foods that have a lot of fiber, such as beans, whole grains, and fresh fruits and  vegetables.  Drink enough fluid to keep your urine pale yellow.  Take over-the-counter and prescription medicines only as told by your health care provider. This includes any fiber supplements. This information is not intended to replace advice given to you by your health care provider. Make sure you discuss any questions you have with your health care provider. Document Revised: 07/21/2019 Document Reviewed: 07/21/2019 Elsevier Patient Education  [place].

## 2020-12-06 ENCOUNTER — Ambulatory Visit
Admission: RE | Admit: 2020-12-06 | Discharge: 2020-12-06 | Disposition: A | Discharge status: Home / Self Care | Payer: Medicare PPO | Source: Ambulatory Visit | Attending: Adult Health | Admitting: Adult Health

## 2020-12-06 ENCOUNTER — Encounter: Payer: Self-pay | Admitting: Adult Health

## 2020-12-06 DIAGNOSIS — M545 Low back pain, unspecified: Secondary | ICD-10-CM

## 2020-12-06 LAB — COMPLETE METABOLIC PANEL WITH GFR
AG Ratio: 1.2 (calc) (ref 1.0–2.5)
ALT: 17 U/L (ref 6–29)
AST: 18 U/L (ref 10–35)
Albumin: 4.2 g/dL (ref 3.6–5.1)
Alkaline phosphatase (APISO): 74 U/L (ref 37–153)
BUN: 18 mg/dL (ref 7–25)
CO2: 30 mmol/L (ref 20–32)
Calcium: 11 mg/dL — ABNORMAL HIGH (ref 8.6–10.4)
Chloride: 102 mmol/L (ref 98–110)
Creat: 0.82 mg/dL (ref 0.60–0.88)
GFR, Est African American: 78 mL/min/{1.73_m2} (ref >=60)
GFR, Est Non African American: 68 mL/min/{1.73_m2} (ref >=60)
Globulin: 3.5 g/dL (calc) (ref 1.9–3.7)
Glucose, Bld: 87 mg/dL (ref 65–99)
Potassium: 3.9 mmol/L (ref 3.5–5.3)
Sodium: 140 mmol/L (ref 135–146)
Total Bilirubin: 0.3 mg/dL (ref 0.2–1.2)
Total Protein: 7.7 g/dL (ref 6.1–8.1)

## 2020-12-06 LAB — CBC WITH DIFFERENTIAL/PLATELET
Absolute Monocytes: 524 cells/uL (ref 200–950)
Basophils Absolute: 29 cells/uL (ref 0–200)
Basophils Relative: 0.6 %
Eosinophils Absolute: 211 cells/uL (ref 15–500)
Eosinophils Relative: 4.3 %
HCT: 42.4 % (ref 35.0–45.0)
Hemoglobin: 13.5 g/dL (ref 11.7–15.5)
Lymphs Abs: 1627 cells/uL (ref 850–3900)
MCH: 28.6 pg (ref 27.0–33.0)
MCHC: 31.8 g/dL — ABNORMAL LOW (ref 32.0–36.0)
MCV: 89.8 fL (ref 80.0–100.0)
MPV: 9.1 fL (ref 7.5–12.5)
Monocytes Relative: 10.7 %
Neutro Abs: 2509 cells/uL (ref 1500–7800)
Neutrophils Relative %: 51.2 %
Platelets: 389 10*3/uL (ref 140–400)
RBC: 4.72 10*6/uL (ref 3.80–5.10)
RDW: 14.5 % (ref 11.0–15.0)
Total Lymphocyte: 33.2 %
WBC: 4.9 10*3/uL (ref 3.8–10.8)

## 2020-12-06 LAB — URINALYSIS, ROUTINE W REFLEX MICROSCOPIC
Bilirubin Urine: NEGATIVE
Glucose, UA: NEGATIVE
Hgb urine dipstick: NEGATIVE
Ketones, ur: NEGATIVE
Leukocytes,Ua: NEGATIVE
Nitrite: NEGATIVE
Protein, ur: NEGATIVE
Specific Gravity, Urine: 1.011 (ref 1.001–1.03)
pH: 8 (ref 5.0–8.0)

## 2020-12-06 LAB — HEMOGLOBIN A1C
Hgb A1c MFr Bld: 5.6 % of total Hgb (ref <5.7)
Mean Plasma Glucose: 114 mg/dL
eAG (mmol/L): 6.3 mmol/L

## 2020-12-06 LAB — VITAMIN D 25 HYDROXY (VIT D DEFICIENCY, FRACTURES): Vit D, 25-Hydroxy: 91 ng/mL (ref 30–100)

## 2020-12-06 LAB — MAGNESIUM: Magnesium: 2.3 mg/dL (ref 1.5–2.5)

## 2020-12-06 LAB — LIPID PANEL
Cholesterol: 166 mg/dL (ref <200)
HDL: 80 mg/dL (ref >=50)
LDL Cholesterol (Calc): 73 mg/dL (calc)
Non-HDL Cholesterol (Calc): 86 mg/dL (calc) (ref <130)
Total CHOL/HDL Ratio: 2.1 (calc) (ref <5.0)
Triglycerides: 57 mg/dL (ref <150)

## 2020-12-06 LAB — MICROALBUMIN / CREATININE URINE RATIO
Creatinine, Urine: 48 mg/dL (ref 20–275)
Microalb Creat Ratio: 25 mcg/mg creat (ref <30)
Microalb, Ur: 1.2 mg/dL

## 2020-12-06 LAB — TSH: TSH: 0.94 m[IU]/L (ref 0.40–4.50)

## 2020-12-07 ENCOUNTER — Encounter: Payer: Self-pay | Admitting: Adult Health

## 2020-12-07 DIAGNOSIS — G8929 Other chronic pain: Secondary | ICD-10-CM | POA: Insufficient documentation

## 2020-12-17 ENCOUNTER — Other Ambulatory Visit: Payer: Self-pay | Admitting: Internal Medicine

## 2020-12-19 DIAGNOSIS — J3081 Allergic rhinitis due to animal (cat) (dog) hair and dander: Secondary | ICD-10-CM | POA: Diagnosis not present

## 2020-12-19 DIAGNOSIS — J3089 Other allergic rhinitis: Secondary | ICD-10-CM | POA: Diagnosis not present

## 2020-12-19 DIAGNOSIS — J301 Allergic rhinitis due to pollen: Secondary | ICD-10-CM | POA: Diagnosis not present

## 2020-12-19 DIAGNOSIS — R0602 Shortness of breath: Secondary | ICD-10-CM | POA: Diagnosis not present

## 2021-01-03 ENCOUNTER — Other Ambulatory Visit: Payer: Self-pay

## 2021-01-03 ENCOUNTER — Ambulatory Visit (INDEPENDENT_AMBULATORY_CARE_PROVIDER_SITE_OTHER): Payer: Medicare PPO | Admitting: *Deleted

## 2021-01-03 NOTE — Progress Notes (Signed)
Patient is here for a NV and lab to check a calcium with PTH, due to elevated serum calcium. Patient has stopped all calcium supplements. She also stopped her Crestor 5 mg, which contains calcium and would like a different cholesterol medication sent to her pharmacy.

## 2021-01-04 ENCOUNTER — Other Ambulatory Visit: Payer: Self-pay | Admitting: Adult Health

## 2021-01-04 LAB — PTH, INTACT AND CALCIUM
Calcium: 10.6 mg/dL — ABNORMAL HIGH (ref 8.6–10.4)
PTH: 59 pg/mL (ref 16–77)

## 2021-01-30 ENCOUNTER — Other Ambulatory Visit: Payer: Self-pay | Admitting: Adult Health

## 2021-03-09 ENCOUNTER — Encounter: Payer: Self-pay | Admitting: Adult Health

## 2021-03-09 ENCOUNTER — Other Ambulatory Visit: Payer: Self-pay

## 2021-03-09 ENCOUNTER — Ambulatory Visit: Payer: Medicare PPO | Admitting: Adult Health

## 2021-03-09 VITALS — BP 124/82 | HR 74 | Temp 97.7°F | Wt 146.0 lb

## 2021-03-09 DIAGNOSIS — K5790 Diverticulosis of intestine, part unspecified, without perforation or abscess without bleeding: Secondary | ICD-10-CM | POA: Diagnosis not present

## 2021-03-09 DIAGNOSIS — K219 Gastro-esophageal reflux disease without esophagitis: Secondary | ICD-10-CM

## 2021-03-09 DIAGNOSIS — Z79899 Other long term (current) drug therapy: Secondary | ICD-10-CM

## 2021-03-09 DIAGNOSIS — J302 Other seasonal allergic rhinitis: Secondary | ICD-10-CM

## 2021-03-09 DIAGNOSIS — Z0001 Encounter for general adult medical examination with abnormal findings: Secondary | ICD-10-CM | POA: Diagnosis not present

## 2021-03-09 DIAGNOSIS — I10 Essential (primary) hypertension: Secondary | ICD-10-CM | POA: Diagnosis not present

## 2021-03-09 DIAGNOSIS — R6889 Other general symptoms and signs: Secondary | ICD-10-CM

## 2021-03-09 DIAGNOSIS — M1990 Unspecified osteoarthritis, unspecified site: Secondary | ICD-10-CM

## 2021-03-09 DIAGNOSIS — E782 Mixed hyperlipidemia: Secondary | ICD-10-CM

## 2021-03-09 DIAGNOSIS — R7309 Other abnormal glucose: Secondary | ICD-10-CM | POA: Diagnosis not present

## 2021-03-09 DIAGNOSIS — Z87891 Personal history of nicotine dependence: Secondary | ICD-10-CM

## 2021-03-09 DIAGNOSIS — M8580 Other specified disorders of bone density and structure, unspecified site: Secondary | ICD-10-CM | POA: Diagnosis not present

## 2021-03-09 DIAGNOSIS — Z86010 Personal history of colonic polyps: Secondary | ICD-10-CM

## 2021-03-09 DIAGNOSIS — G8929 Other chronic pain: Secondary | ICD-10-CM

## 2021-03-09 DIAGNOSIS — N182 Chronic kidney disease, stage 2 (mild): Secondary | ICD-10-CM | POA: Diagnosis not present

## 2021-03-09 DIAGNOSIS — E559 Vitamin D deficiency, unspecified: Secondary | ICD-10-CM

## 2021-03-09 DIAGNOSIS — J449 Chronic obstructive pulmonary disease, unspecified: Secondary | ICD-10-CM | POA: Diagnosis not present

## 2021-03-09 DIAGNOSIS — B009 Herpesviral infection, unspecified: Secondary | ICD-10-CM

## 2021-03-09 DIAGNOSIS — J849 Interstitial pulmonary disease, unspecified: Secondary | ICD-10-CM

## 2021-03-09 DIAGNOSIS — I700 Atherosclerosis of aorta: Secondary | ICD-10-CM

## 2021-03-09 DIAGNOSIS — Z0000 Encounter for general adult medical examination without abnormal findings: Secondary | ICD-10-CM

## 2021-03-09 DIAGNOSIS — M545 Low back pain, unspecified: Secondary | ICD-10-CM

## 2021-03-09 NOTE — Progress Notes (Signed)
MEDICARE AND FOLLOW UP  Assessment:   Diagnoses and all orders for this visit:  Annual Medicare Wellness Visit Due annually   Essential hypertension Continue medication; labile; well controlled at home; take clonidine PRN Monitor blood pressure at home; call if consistently over 130/80 Continue DASH diet.   Reminder to go to the ER if any CP, SOB, nausea, dizziness, severe HA, changes vision/speech, left arm numbness and tingling and jaw pain.  Aortic atherosclerosis ([place]) Per CT 2020 LDL goal <100, continue statin  Control blood pressure, cholesterol, glucose, increase exercise.   ILD (interstitial lung disease) ([place]) Followed by Dr. Ashok Cordia  COPD, mild [facility]) Followed by Dr. Ashok Cordia  Chronic seasonal allergic rhinitis Continue allergy pill  Bronchiectasis without acute exacerbation ([place]) Followed by Dr. Ashok Cordia  Gastroesophageal reflux disease, esophagitis presence not specified Well managed on current medications Discussed diet, avoiding triggers and other lifestyle changes  Diverticulosis Increase fiber and fluids  Arthritis Tylenol PRN  CKD (chronic kidney disease) stage 3, GFR 30-59 ml/min (HCC) Increase fluids, avoid NSAIDS, monitor sugars, will monitor CMP/GFR  Vitamin D deficiency Continue supplementation Check vitamin D level  Other abnormal glucose Recent A1Cs at goal Discussed diet/exercise, weight management  Defer A1C; check CMP  Medication management CBC, CMP/GFR, magnesium   HSV-1 (herpes simplex virus 1) infection Antiviral PRN  Dyslipidemia Continue rosuvastatin  LDL goal 100 Continue low cholesterol diet and exercise.  Check lipid panel.   Osteopenia - get dexa 06/2021 (order placed), continue Vit D and Ca, weight bearing exercises  Abdominal pain/ Polyp of colon, unspecified part of colon, unspecified type Get on fiber supplement, increase water, increase exercise Due for colonoscopy; referral placed to GI Follow up if not  improving  Lumbar back pain/ lumbar spondylosis Persistent intermittent, without radicular sx Tylenol PRN, heat/ice, back exercises     No orders of the defined types were placed in this encounter.   Over 40 minutes of exam, counseling, chart review and critical decision making was performed Future Appointments  Date Time Provider [place]  06/28/2021 10:30 AM Liane Comber, NP [facility] None  07/24/2021 10:00 AM [facility] 1 [facility]  12/05/2021  3:00 PM Liane Comber, NP [facility] None  03/11/2022 10:30 AM Liane Comber, NP [facility] None     Plan:   During the course of the visit the patient was educated and counseled about appropriate screening and preventive services including:   Pneumococcal vaccine  Prevnar 13 Influenza vaccine Td vaccine Screening electrocardiogram Bone densitometry screening Colorectal cancer screening Diabetes screening Glaucoma screening Nutrition counseling  Advanced directives: requested   Subjective:  Catherine Parks is a 80 y.o. female who presents for AWV and 3 month follow up. She has Essential hypertension; HSV-1 (herpes simplex virus 1) infection; Diverticulosis; History of colon polyps; Vitamin D deficiency; CKD (chronic kidney disease) stage 2, GFR 60-89 ml/min; Medication management; GERD ; COPD, mild ([place]); Arthritis; Chronic seasonal allergic rhinitis; ILD (interstitial lung disease) ([place]); Aortic atherosclerosis ([place]); Abnormal EKG; Former smoker; Abnormal glucose (prediabetes); Hyperlipidemia, mixed; Osteopenia; Hypercalcemia; and Chronic lumbar pain on their problem list.  Patient has been followed by Dr Chase Caller for COPD/ILD and a 6 mm LLL nodule by CT scans which has remained stable in size since 1st discovered in May 2018.  Follows with pulm annually, getting low dose screening CT due to 58 pack year smoking hx, quit in 2016. Last in 04/24/2020 showed ? PAH, emphysematous changes, ILD. ECHO 06/2020 was  benign without evidence of PAH.  Patient has hx/o GERD stable by lifestyle & famotidine.  She has hx of colon polyps, diverticulosis, last colonoscopy Dr. Sharlett Iles 10/2010; she reported having more constipation (intermittent, normal calibur), lower abdominal discomfort, she was advised fiber, miralax, fluid intake (reports improved with fiber) and she was referred to GI Dr. Tarri Glenn for routine colonoscopy but hasn't heard back to schedule.   She has lumbar pain with moderate spondylosis on xray 11/2020, manging well with PRN tylenol.   BMI is Body mass index is 23.57 kg/m., she has been working on diet, not exercising since covid 19 due to pulm conditions and restrictions, [facility] Too hot  Wt Readings from Last 3 Encounters:  03/09/21 146 lb (66.2 kg)  01/03/21 146 lb 12.8 oz (66.6 kg)  12/05/20 144 lb 6.4 oz (65.5 kg)   She has aortic atherosclerosis per CT 03/2019 Had stress test and ECHO in 12/2017 following chest/jaw pain episode which were unremarkable -    Her blood pressure has been controlled at home (110-120s/70s), today their BP is BP: 124/82, will take clonidine 0.2 mg PRN, typically once a week for labile BPs She does not workout. She denies chest pain, shortness of breath, dizziness.   She is on cholesterol medication (rosuvastatin 5 mg 3 days a week) and denies myalgias. Her cholesterol is not at goal. She is doing plant based, no dairy, low fat poultry. The cholesterol last visit was:   Lab Results  Component Value Date   CHOL 166 12/05/2020   HDL 80 12/05/2020   LDLCALC 73 12/05/2020   TRIG 57 12/05/2020   CHOLHDL 2.1 12/05/2020    She has been working on diet and exercise for glucose management, and denies foot ulcerations, increased appetite, nausea, paresthesia of the feet, polydipsia, polyuria, visual disturbances, vomiting and weight loss.  Last A1C in the office was:  Lab Results  Component Value Date   HGBA1C 5.6  12/05/2020   Last GFR: Lab Results  Component Value Date   GFRAA 78 12/05/2020   Patient is on Vitamin D supplement and at goal at recent check:    Lab Results  Component Value Date   VD25OH 91 12/05/2020     Has had elevated calcium, denies supplement or tums.  Lab Results  Component Value Date   PTH 59 01/03/2021   CALCIUM 10.6 (H) 01/03/2021      Medication Review: Current Outpatient Medications on File Prior to Visit  Medication Sig Dispense Refill   acetaminophen (TYLENOL) 500 MG tablet Take 500 mg by mouth every 6 (six) hours as needed (pain).     amLODipine (NORVASC) 10 MG tablet TAKE 1 TABLET DAILY FOR BLOOD PRESSURE 90 tablet 3   Ascorbic Acid (VITAMIN C) 100 MG tablet Take 100 mg by mouth daily.     aspirin EC 81 MG tablet Take 81 mg by mouth daily.     azelastine (ASTELIN) 0.1 % nasal spray      cholecalciferol (VITAMIN D) 1000 units tablet Take 6,000 Units by mouth daily after lunch.      cloNIDine (CATAPRES) 0.2 MG tablet 0.2 mg as needed.     famotidine (PEPCID) 40 MG tablet Take  1 tablet  Daily  to Prevent Heartburn & Indigestion 90 tablet 3   Flaxseed, Linseed, (FLAX SEED OIL) 1000 MG CAPS Take 1,000 mg by mouth daily after lunch.     halobetasol (ULTRAVATE) 0.05 % cream Apply topically 2 (two) times daily. Apply to  Eczema Rash 2 x/day 100 g 11   hydrocortisone 2.5 % cream      Magnesium 500 MG TABS Take 500 mg by mouth.     Naphazoline HCl (CLEAR EYES OP) Place 1 drop into both eyes daily.     nitroGLYCERIN (NITROSTAT) 0.4 MG SL tablet Place 0.4 mg under the tongue every 5 (five) minutes as needed for chest pain.     OVER THE COUNTER MEDICATION Takes OTC eczema cream PRN.     rosuvastatin (CRESTOR) 5 MG tablet TAKE 1 TAB IN THE EVENING 3 DAYS A WEEK (MWF) FOR CHOLESTEROL TO REDUCE HEART ATTACK/STROKE RISK. 36 tablet 1   traMADol (ULTRAM) 50 MG tablet Take 1 tablet (50 mg total) by mouth every 12 (twelve) hours as needed for moderate pain. 1 pill twice daily  as needed for pain 60 tablet 0   triamterene-hydrochlorothiazide (MAXZIDE-25) 37.5-25 MG tablet TAKE 1 TABLET DAILY FOR BP & FLUID 90 tablet 3   Wheat Dextrin (BENEFIBER PO) Take 1 Dose by mouth daily.     zinc gluconate 50 MG tablet Take 50 mg by mouth daily.     No current facility-administered medications on file prior to visit.    Allergies  Allergen Reactions   Ace Inhibitors Cough   Lemon Oil Hives    Reaction to lemons   Natural Vegetable Orange [Psyllium]     oranges   Shellfish Allergy Swelling and Hives    Current Problems (verified) Patient Active Problem List   Diagnosis Date Noted   Chronic lumbar pain 12/07/2020   Hypercalcemia 12/06/2020   Osteopenia 12/05/2020   Former smoker 05/08/2018   Abnormal glucose (prediabetes) 05/08/2018   Hyperlipidemia, mixed 05/08/2018   Abnormal EKG 12/22/2017   Aortic atherosclerosis ([place]) 07/10/2017   ILD (interstitial lung disease) ([place]) 09/27/2016   Arthritis 08/02/2016   Chronic seasonal allergic rhinitis 08/02/2016   COPD, mild ([place]) 06/07/2016   GERD  01/05/2015   CKD (chronic kidney disease) stage 2, GFR 60-89 ml/min 06/08/2014   Medication management 06/08/2014   Vitamin D deficiency 08/19/2013   Essential hypertension    HSV-1 (herpes simplex virus 1) infection    Diverticulosis    History of colon polyps     Screening Tests Immunization History  Administered Date(s) Administered   DTaP 07/30/2011   Fluad Quad(high Dose 65+) 08/25/2020   Influenza, High Dose Seasonal PF 06/07/2016, 06/25/2017, 09/22/2018, 07/19/2019   PFIZER(Purple Top)SARS-COV-2 Vaccination 10/05/2019, 10/25/2019, 08/25/2020   Pneumococcal Conjugate-13 11/22/2015   Pneumococcal Polysaccharide-23 05/11/2013   Pneumococcal-Unspecified 01/07/2002   Td 01/07/2002, 05/07/2012   Preventative care: Last colonoscopy: 10/2010, diverticulosis, 10 year follow up per Dr. Sharlett Iles Mammogram: 04/2020 -  DEXA: 03/2016 L fem T-1.3, 04/2019 L fem T -1.4,  ordered to schedule [facility]  CT lung 04/2020 Former smoker quit 2016, 50+ pack year hx - pulm is following  Prior vaccinations: TD or Tdap: 2013  Influenza: 08/2020 Pneumococcal: 2003, 2014 Prevnar13: 2017 Shingles/Zostavax: declines Covid 19: 3/3, 2021, pfizer + booster  Names of Other Physician/Practitioners you currently use: 1. [facility] here for primary care 2. [facility], eye doctor, last visit 2021, looking for new provider 3. Dr. Michell Heinrich, dentist, last visit 2022, q39m  Patient Care Team: Unk Pinto, MD as PCP - General (Internal Medicine) Warden Fillers, MD as Consulting Physician (Optometry) Croitoru, Dani Gobble, MD as Consulting Physician (Cardiology) Sable Feil, MD as Consulting Physician (Gastroenterology) Mickle Plumb, MD (Gynecology) Druscilla Brownie, MD as Consulting Physician (Dermatology)  Volanda Napoleon, MD as Consulting Physician (Oncology) Rozetta Nunnery, MD as Consulting Physician (Otolaryngology)  SURGICAL HISTORY She  has a past surgical history that includes Eye surgery; Abdominal hysterectomy; Colonoscopy; Direct laryngoscopy (N/A, 10/21/2014); Mass excision (Right, 10/21/2014); Excision nasal mass (Right, 10/21/2014); and Cesarean section (1969). FAMILY HISTORY Her family history includes Breast cancer in her maternal grandmother and sister; Cancer in her brother and brother; Heart attack in her father; Hypertension in her mother; Other in her brother; Prostate cancer in her brother; Stroke in her mother. SOCIAL HISTORY She  reports that she quit smoking about 6 years ago. Her smoking use included cigarettes. She started smoking about 64 years ago. She has a 58.00 pack-year smoking history. She has never used smokeless tobacco. She reports that she does not drink alcohol and does not use drugs.  MEDICARE WELLNESS OBJECTIVES: Physical activity: Current Exercise Habits: The patient does not  participate in regular exercise at present, Exercise limited by: None identified Cardiac risk factors: Cardiac Risk Factors include: advanced age (>33men, >38 women);dyslipidemia;hypertension;sedentary lifestyle;smoking/ tobacco exposure Depression/mood screen:   Depression screen [facility] 2/9 03/09/2021  Decreased Interest 0  Down, Depressed, Hopeless 0  PHQ - 2 Score 0    ADLs:  In your present state of health, do you have any difficulty performing the following activities: 03/09/2021  Hearing? N  Vision? N  Difficulty concentrating or making decisions? N  Walking or climbing stairs? N  Dressing or bathing? N  Doing errands, shopping? N  Some recent data might be hidden     Cognitive Testing  Alert? Yes  Normal Appearance?Yes  Oriented to person? Yes  Place? Yes   Time? Yes  Recall of three objects?  Yes  Can perform simple calculations? Yes  Displays appropriate judgment?Yes  Can read the correct time from a watch face?Yes  EOL planning: Does Patient Have a Medical Advance Directive?: No Would patient like information on creating a medical advance directive?: No - Patient declined     Review of Systems  Constitutional:  Negative for malaise/fatigue and weight loss.  HENT:  Negative for hearing loss and tinnitus.   Eyes:  Negative for blurred vision and double vision.  Respiratory:  Negative for cough, sputum production, shortness of breath and wheezing.   Cardiovascular:  Negative for chest pain, palpitations, orthopnea, claudication, leg swelling and PND.  Gastrointestinal:  Positive for abdominal pain (mild lower abdominal discomfort, intermittent) and blood in stool (single episode last week ). Negative for constipation (intermittent, very hard stools), diarrhea, heartburn, melena, nausea and vomiting.  Genitourinary: Negative.   Musculoskeletal:  Negative for falls, joint pain and myalgias.  Skin:  Negative for rash.  Neurological:  Negative for dizziness, tingling,  sensory change, weakness and headaches.  Endo/Heme/Allergies:  Negative for polydipsia.  Psychiatric/Behavioral: Negative.  Negative for depression, memory loss, substance abuse and suicidal ideas. The patient is not nervous/anxious and does not have insomnia.   All other systems reviewed and are negative.   Objective:     Today's Vitals   03/09/21 0949 03/09/21 1006  BP: (!) 146/82 124/82  Pulse: 74   Temp: 97.7 F (36.5 C)   SpO2: 99%   Weight: 146 lb (66.2 kg)    Body mass index is 23.57 kg/m.  General appearance: alert, no distress, WD/WN, female HEENT: normocephalic, sclerae anicteric, TMs pearly, nares patent, no discharge or erythema, pharynx normal Oral cavity: MMM, no lesions Neck: supple, no lymphadenopathy, no thyromegaly, no masses Heart: RRR, normal S1, S2,  no murmurs Lungs: CTA bilaterally, no wheezes, rhonchi, or rales Abdomen: +bs, soft, non tender, non distended, no masses, no hepatomegaly, no splenomegaly Musculoskeletal: nontender, no swelling, no obvious deformity. Intact lumbar ROM without pain, neg straight leg raise, non-tender. L hip with mildly decreased external rotation without pain or crepitus.  Extremities: no edema, no cyanosis, no clubbing Pulses: 2+ symmetric, upper and lower extremities, normal cap refill Neurological: alert, oriented x 3, CN2-12 intact, strength normal upper extremities and lower extremities, sensation normal throughout, DTRs 2+ throughout, no cerebellar signs, gait normal Psychiatric: normal affect, behavior normal, pleasant    Medicare Attestation I have personally reviewed: The patient's medical and social history Their use of alcohol, tobacco or illicit drugs Their current medications and supplements The patient's functional ability including ADLs,fall risks, home safety risks, cognitive, and hearing and visual impairment Diet and physical activities Evidence for depression or mood disorders  The patient's weight,  height, BMI, and visual acuity have been recorded in the chart.  I have made referrals, counseling, and provided education to the patient based on review of the above and I have provided the patient with a written personalized care plan for preventive services.     Izora Ribas, NP   03/09/2021

## 2021-03-10 ENCOUNTER — Other Ambulatory Visit: Payer: Self-pay | Admitting: Adult Health

## 2021-03-10 LAB — LIPID PANEL
Cholesterol: 192 mg/dL (ref <200)
HDL: 92 mg/dL (ref >=50)
LDL Cholesterol (Calc): 86 mg/dL (calc)
Non-HDL Cholesterol (Calc): 100 mg/dL (calc) (ref <130)
Total CHOL/HDL Ratio: 2.1 (calc) (ref <5.0)
Triglycerides: 62 mg/dL (ref <150)

## 2021-03-10 LAB — COMPLETE METABOLIC PANEL WITH GFR
AG Ratio: 1.2 (calc) (ref 1.0–2.5)
ALT: 19 U/L (ref 6–29)
AST: 24 U/L (ref 10–35)
Albumin: 4.1 g/dL (ref 3.6–5.1)
Alkaline phosphatase (APISO): 78 U/L (ref 37–153)
BUN/Creatinine Ratio: 18 (calc) (ref 6–22)
BUN: 16 mg/dL (ref 7–25)
CO2: 29 mmol/L (ref 20–32)
Calcium: 10.8 mg/dL — ABNORMAL HIGH (ref 8.6–10.4)
Chloride: 101 mmol/L (ref 98–110)
Creat: 0.9 mg/dL — ABNORMAL HIGH (ref 0.60–0.88)
GFR, Est African American: 70 mL/min/{1.73_m2} (ref >=60)
GFR, Est Non African American: 60 mL/min/{1.73_m2} (ref >=60)
Globulin: 3.5 g/dL (calc) (ref 1.9–3.7)
Glucose, Bld: 94 mg/dL (ref 65–99)
Potassium: 4 mmol/L (ref 3.5–5.3)
Sodium: 140 mmol/L (ref 135–146)
Total Bilirubin: 0.4 mg/dL (ref 0.2–1.2)
Total Protein: 7.6 g/dL (ref 6.1–8.1)

## 2021-03-10 LAB — CBC WITH DIFFERENTIAL/PLATELET
Absolute Monocytes: 657 cells/uL (ref 200–950)
Basophils Absolute: 32 cells/uL (ref 0–200)
Basophils Relative: 0.6 %
Eosinophils Absolute: 170 cells/uL (ref 15–500)
Eosinophils Relative: 3.2 %
HCT: 40.6 % (ref 35.0–45.0)
Hemoglobin: 13.5 g/dL (ref 11.7–15.5)
Lymphs Abs: 1389 cells/uL (ref 850–3900)
MCH: 29.1 pg (ref 27.0–33.0)
MCHC: 33.3 g/dL (ref 32.0–36.0)
MCV: 87.5 fL (ref 80.0–100.0)
MPV: 9.2 fL (ref 7.5–12.5)
Monocytes Relative: 12.4 %
Neutro Abs: 3053 cells/uL (ref 1500–7800)
Neutrophils Relative %: 57.6 %
Platelets: 367 10*3/uL (ref 140–400)
RBC: 4.64 10*6/uL (ref 3.80–5.10)
RDW: 15.1 % — ABNORMAL HIGH (ref 11.0–15.0)
Total Lymphocyte: 26.2 %
WBC: 5.3 10*3/uL (ref 3.8–10.8)

## 2021-03-10 LAB — MAGNESIUM: Magnesium: 2.6 mg/dL — ABNORMAL HIGH (ref 1.5–2.5)

## 2021-03-10 LAB — TSH: TSH: 1.29 mIU/L (ref 0.40–4.50)

## 2021-03-10 MED ORDER — MAGNESIUM 250 MG PO TABS: 1.0000 | ORAL_TABLET | Freq: Every day | ORAL | 0 refills | Status: AC

## 2021-03-12 ENCOUNTER — Ambulatory Visit: Payer: Medicare PPO | Admitting: Adult Health Nurse Practitioner

## 2021-03-12 DIAGNOSIS — I10 Essential (primary) hypertension: Secondary | ICD-10-CM | POA: Diagnosis not present

## 2021-03-12 DIAGNOSIS — Z79899 Other long term (current) drug therapy: Secondary | ICD-10-CM | POA: Diagnosis not present

## 2021-03-12 DIAGNOSIS — I700 Atherosclerosis of aorta: Secondary | ICD-10-CM | POA: Diagnosis not present

## 2021-03-12 DIAGNOSIS — R7309 Other abnormal glucose: Secondary | ICD-10-CM | POA: Diagnosis not present

## 2021-03-12 DIAGNOSIS — N182 Chronic kidney disease, stage 2 (mild): Secondary | ICD-10-CM | POA: Diagnosis not present

## 2021-03-12 DIAGNOSIS — E782 Mixed hyperlipidemia: Secondary | ICD-10-CM | POA: Diagnosis not present

## 2021-03-13 ENCOUNTER — Encounter: Payer: Self-pay | Admitting: Adult Health

## 2021-03-13 LAB — EXTRA URINE SPECIMEN

## 2021-03-13 LAB — CALCIUM, URINE, 24 HOUR: Calcium, 24H Urine: 48 mg/24 h

## 2021-03-29 ENCOUNTER — Encounter: Payer: Self-pay | Admitting: Gastroenterology

## 2021-04-18 ENCOUNTER — Other Ambulatory Visit: Payer: Self-pay | Admitting: Adult Health

## 2021-04-26 ENCOUNTER — Other Ambulatory Visit: Payer: Self-pay | Admitting: Internal Medicine

## 2021-04-30 DIAGNOSIS — Z1231 Encounter for screening mammogram for malignant neoplasm of breast: Secondary | ICD-10-CM | POA: Diagnosis not present

## 2021-04-30 LAB — HM MAMMOGRAPHY

## 2021-05-04 ENCOUNTER — Ambulatory Visit: Payer: Medicare PPO | Admitting: Gastroenterology

## 2021-05-04 ENCOUNTER — Encounter: Payer: Self-pay | Admitting: Gastroenterology

## 2021-05-04 VITALS — BP 156/70 | HR 90 | Ht 66.0 in | Wt 146.0 lb

## 2021-05-04 DIAGNOSIS — G8929 Other chronic pain: Secondary | ICD-10-CM | POA: Diagnosis not present

## 2021-05-04 DIAGNOSIS — R1031 Right lower quadrant pain: Secondary | ICD-10-CM | POA: Diagnosis not present

## 2021-05-04 MED ORDER — PANTOPRAZOLE SODIUM 40 MG PO TBEC: 40.0000 mg | DELAYED_RELEASE_TABLET | ORAL | 3 refills | Status: DC

## 2021-05-04 NOTE — Progress Notes (Signed)
Referring Provider: Unk Pinto, MD Primary Care Physician:  Unk Pinto, MD  Reason for Consultation: Right lower quadrant pain   IMPRESSION:  Right lower quadrant abdominal pain Intermittent rectal bleeding Patient concerns about ulcers without use of NSAIDs  The differential of common causes of chronic lower abdominal pain includes symptomatic uncomplicated diverticular disease, PUD.  functional abdominal pain, irritable bowel syndrome, inflammatory bowel disease, spastic bowel disorders NOS, chronic constipation, urologic disorders, uro-GYN logic disorders, abdominal wall pain, narcotic bowel syndrome, and referred pain. Rectal bleeding prioritizes the need for colonoscopy for evaluation.   PLAN: Pantropazole 40 mg QAM EGD and colonoscopy to evaluate her abdominal pain CT abd/pelvis with contrast if endoscopy is negative  Please see the "Patient Instructions" section for addition details about the plan.  HPI: Catherine Parks is a 80 y.o. female referred by Liane Comber for further evaluation of right lower quadrant abdominal pain.  The history is obtained through the patient and review of her electronic health record.  She has essential hypertension, aortic atherosclerosis, interstitial lung disease, 6 mm left lower lung nodule by CT scan that is been stable since 2018, mild COPD not requiring supplemental oxygen, chronic seasonal allergic rhinitis, chronic reflux, diverticulosis, stage III chronic kidney disease, vitamin D deficiency, dyslipidemia, osteopenia, chronic low back pain.  She is a retired Programmer, [employer].   Recent worsening of constipation, lower abdominal discomfort, and rare episodes of rectal bleeding. Right lower abdominal pain improves after she walks. Eating tomatoes may worsen her symptoms. Carbonated beverages are not something that she normally consumes, but, these will worsen her symptoms when she does. Otherwise, no charge with eating. No  change in pain with defecation.   Uses Benefiber to have a bowel movements most days. No straining.  Tramadol provided to manage her pain but has had no significant relief.  She uses Acetimphen instead of NSAIDs. Has not tried OTC meds or other dietary changes.   She has a appetite. Weight is stable. Energy is "lazy."  Screening colonoscopy in 2012 with Dr. Sharlett Iles showed left-sided diverticulosis with a tight sigmoid lumen and associated stenosis.  The exam was otherwise normal.  Repeat exam recommended in 10 years.  She is eager for endoscopic evaluation for reassurance.   Labs 03/09/21: normal CMP except for creatine 0/90, calcium 10.8; normal CBC  No known family history of colon cancer or polyps. No family history of uterine/endometrial cancer, pancreatic cancer or gastric/stomach cancer.   Past Medical History:  Diagnosis Date   Allergic rhinitis    Arthritis    Bronchiectasis ([place])    Colon polyps    Diverticulosis    Emphysema of lung ([place])    GERD (gastroesophageal reflux disease)    HSV-1 (herpes simplex virus 1) infection    Hypertension    Pulmonary nodule 07/10/2017   CT 01/2017, CT 07/2017 - stable on follow up, no further follow up recommended.    Past Surgical History:  Procedure Laterality Date   ABDOMINAL HYSTERECTOMY     ovaries spared   CESAREAN SECTION  1969   COLONOSCOPY     DIRECT LARYNGOSCOPY N/A 10/21/2014   Procedure: DIRECT LARYNGOSCOPY;  Surgeon: Rozetta Nunnery, MD;  Location: [place];  Service: ENT;  Laterality: N/A;   EXCISION NASAL MASS Right 10/21/2014   Procedure: EXCISION NASOPHARYNGEAL MASS;  Surgeon: Rozetta Nunnery, MD;  Location: [place];  Service: ENT;  Laterality: Right;   EYE SURGERY     cataracts  MASS EXCISION Right 10/21/2014   Procedure: RIGHT NECK NODE BIOPSY;  Surgeon: Rozetta Nunnery, MD;  Location: [place];  Service: ENT;  Laterality: Right;    Current  Outpatient Medications  Medication Sig Dispense Refill   acetaminophen (TYLENOL) 500 MG tablet Take 500 mg by mouth every 6 (six) hours as needed (pain).     amLODipine (NORVASC) 10 MG tablet TAKE 1 TABLET BY MOUTH EVERY DAY FOR BLOOD PRESSURE 90 tablet 3   Ascorbic Acid (VITAMIN C) 100 MG tablet Take 100 mg by mouth daily.     aspirin EC 81 MG tablet Take 81 mg by mouth daily.     azelastine (ASTELIN) 0.1 % nasal spray      cholecalciferol (VITAMIN D) 1000 units tablet Take 6,000 Units by mouth daily after lunch.      cloNIDine (CATAPRES) 0.2 MG tablet 0.2 mg as needed.     famotidine (PEPCID) 40 MG tablet Take  1 tablet  Daily  to Prevent Heartburn & Indigestion 90 tablet 3   halobetasol (ULTRAVATE) 0.05 % cream Apply topically 2 (two) times daily. Apply to Eczema Rash 2 x/day 100 g 11   hydrocortisone 2.5 % cream      Magnesium 250 MG TABS Take 1 tablet (250 mg total) by mouth daily. 30 tablet 0   Naphazoline HCl (CLEAR EYES OP) Place 1 drop into both eyes daily.     nitroGLYCERIN (NITROSTAT) 0.4 MG SL tablet Place 0.4 mg under the tongue every 5 (five) minutes as needed for chest pain.     OVER THE COUNTER MEDICATION Takes OTC eczema cream PRN.     rosuvastatin (CRESTOR) 5 MG tablet TAKE 1 TAB IN THE EVENING 3 DAYS A WEEK (MWF) FOR CHOLESTEROL TO REDUCE HEART ATTACK/STROKE RISK. 36 tablet 1   triamterene-hydrochlorothiazide (MAXZIDE-25) 37.5-25 MG tablet TAKE 1 TABLET DAILY FOR BP & FLUID 90 tablet 3   Wheat Dextrin (BENEFIBER PO) Take 1 Dose by mouth daily.     zinc gluconate 50 MG tablet Take 50 mg by mouth daily.     Flaxseed, Linseed, (FLAX SEED OIL) 1000 MG CAPS Take 1,000 mg by mouth daily after lunch. (Patient not taking: Reported on 05/04/2021)     traMADol (ULTRAM) 50 MG tablet Take 1 tablet (50 mg total) by mouth every 12 (twelve) hours as needed for moderate pain. 1 pill twice daily as needed for pain (Patient not taking: Reported on 05/04/2021) 60 tablet 0   No current  facility-administered medications for this visit.    Allergies as of 05/04/2021 - Review Complete 05/04/2021  Allergen Reaction Noted   Ace inhibitors Cough 08/12/2018   Lemon oil Hives 11/30/2013   Natural vegetable orange [psyllium]  06/16/2020   Shellfish allergy Swelling and Hives 08/18/2013    Family History  Problem Relation Age of Onset   Hypertension Mother    Stroke Mother    Heart attack Father    Breast cancer Sister    Cancer Brother    Prostate cancer Brother    Other Brother        Heart Issues   Cancer Brother    Breast cancer Maternal Grandmother    Lung disease Neg Hx    Rheumatologic disease Neg Hx    Colon cancer Neg Hx    Esophageal cancer Neg Hx    Pancreatic cancer Neg Hx    Liver cancer Neg Hx        Review of Systems: 12 system ROS  is negative except as noted above.   Physical Exam: General:   Alert,  well-nourished, pleasant and cooperative in NAD Head:  Normocephalic and atraumatic. Eyes:  Sclera clear, no icterus.   Conjunctiva pink. Ears:  Normal auditory acuity. Nose:  No deformity, discharge,  or lesions. Mouth:  No deformity or lesions.   Neck:  Supple; no masses or thyromegaly. Lungs:  Clear throughout to auscultation.   No wheezes. Heart:  Regular rate and rhythm; no murmurs. Abdomen:  Soft,nontender, nondistended, normal bowel sounds, no rebound or guarding. No hepatosplenomegaly.   Rectal:  Deferred  Msk:  Symmetrical. No boney deformities LAD: No inguinal or umbilical LAD Extremities:  No clubbing or edema. Neurologic:  Alert and  oriented x4;  grossly nonfocal Skin:  Intact without significant lesions or rashes. Psych:  Alert and cooperative. Normal mood and affect.      Davian Wollenberg L. Tarri Glenn, MD, MPH 05/04/2021, 9:13 AM

## 2021-05-04 NOTE — Patient Instructions (Signed)
It was my pleasure to provide care to you today. Based on our discussion, I am providing you with my recommendations below:  RECOMMENDATION(S):   PRESCRIPTION MEDICATION(S):   We have sent the following medication(s) to your pharmacy:  Pantoprazole  NOTE: If your medication(s) requires a PRIOR AUTHORIZATION, we will receive notification from your pharmacy. Once received, the process to submit for approval may take up to 7-10 business days. You will be contacted about any denials we have received from your insurance company as well as alternatives recommended by your provider.  ENDOSCOPY AND COLONOSCOPY:   You have been scheduled for an endoscopy and a colonoscopy. Please follow written instructions given to you at your visit today.   PREP:   Please pick up your prep supplies at the pharmacy within the next 1-3 days.  INHALERS:   If you use inhalers (even only as needed), please bring them with you on the day of your procedure.  COLONOSCOPY TIPS:  To reduce nausea and dehydration, stay well hydrated for 3-4 days prior to the exam.  To prevent skin/hemorrhoid irritation - prior to wiping, put A&Dointment or vaseline on the toilet paper. Keep a towel or pad on the bed.  BEFORE STARTING YOUR PREP, drink  64oz of clear liquids in the morning. This will help to flush the colon and will ensure you are well hydrated!!!!  NOTE - This is in addition to the fluids required for to complete your prep. Use of a flavored hard candy, such as grape Anise Salvo, can counteract some of the flavor of the prep and may prevent some nausea.   FOLLOW UP:  After your procedure, you will receive a call from my office staff regarding my recommendation for follow up.  BMI:  If you are age 18 or younger, your body mass index should be between 19-25. Your There is no height or weight on file to calculate BMI. If this is out of the aformentioned range listed, please consider follow up with your Primary  Care Provider.   MY CHART:  The [place] [facility] providers would like to encourage you to use [facility] to communicate with providers for non-urgent requests or questions.  Due to long hold times on the telephone, sending your provider a message by [facility] may be a faster and more efficient way to get a response.  Please allow 48 business hours for a response.  Please remember that this is for non-urgent requests.   Thank you for trusting me with your gastrointestinal care!    Thornton Park, MD, MPH

## 2021-06-26 ENCOUNTER — Encounter: Payer: Self-pay | Admitting: Internal Medicine

## 2021-06-28 ENCOUNTER — Ambulatory Visit: Payer: Medicare PPO | Admitting: Adult Health

## 2021-07-04 ENCOUNTER — Ambulatory Visit (AMBULATORY_SURGERY_CENTER): Payer: Medicare PPO | Admitting: Gastroenterology

## 2021-07-04 ENCOUNTER — Encounter: Payer: Self-pay | Admitting: Gastroenterology

## 2021-07-04 VITALS — BP 136/82 | HR 60 | Temp 98.2°F | Resp 15 | Ht 66.0 in | Wt 146.0 lb

## 2021-07-04 DIAGNOSIS — R1031 Right lower quadrant pain: Secondary | ICD-10-CM | POA: Diagnosis not present

## 2021-07-04 DIAGNOSIS — K449 Diaphragmatic hernia without obstruction or gangrene: Secondary | ICD-10-CM

## 2021-07-04 DIAGNOSIS — K3189 Other diseases of stomach and duodenum: Secondary | ICD-10-CM | POA: Diagnosis not present

## 2021-07-04 DIAGNOSIS — D122 Benign neoplasm of ascending colon: Secondary | ICD-10-CM | POA: Diagnosis not present

## 2021-07-04 DIAGNOSIS — K5730 Diverticulosis of large intestine without perforation or abscess without bleeding: Secondary | ICD-10-CM

## 2021-07-04 DIAGNOSIS — K648 Other hemorrhoids: Secondary | ICD-10-CM

## 2021-07-04 DIAGNOSIS — K2970 Gastritis, unspecified, without bleeding: Secondary | ICD-10-CM | POA: Diagnosis not present

## 2021-07-04 DIAGNOSIS — R1032 Left lower quadrant pain: Secondary | ICD-10-CM | POA: Diagnosis not present

## 2021-07-04 DIAGNOSIS — G8929 Other chronic pain: Secondary | ICD-10-CM

## 2021-07-04 DIAGNOSIS — K319 Disease of stomach and duodenum, unspecified: Secondary | ICD-10-CM | POA: Diagnosis not present

## 2021-07-04 MED ORDER — SODIUM CHLORIDE 0.9 % IV SOLN: 500.0000 mL | Freq: Once | INTRAVENOUS | Status: DC

## 2021-07-04 MED ORDER — PANTOPRAZOLE SODIUM 40 MG PO TBEC: 40.0000 mg | DELAYED_RELEASE_TABLET | Freq: Two times a day (BID) | ORAL | 0 refills | Status: DC

## 2021-07-04 NOTE — Progress Notes (Signed)
Pt in recovery with monitors in place, VSS. Report given to receiving RN. Bite guard was placed with pt awake to ensure comfort. No dental or soft tissue damage noted. 

## 2021-07-04 NOTE — Progress Notes (Signed)
Referring Provider: Unk Pinto, MD Primary Care Physician:  Unk Pinto, MD  Reason for Consultation: Right lower quadrant pain   IMPRESSION:  Right lower quadrant abdominal pain Intermittent rectal bleeding Patient concerns about ulcers without use of NSAIDs Appropriate candidate for monitored anesthesia care in the [place]   PLAN: EGD and colonoscopy to evaluate her abdominal pain  HPI: Catherine Parks is a 80 y.o. female who presents for endoscopy evaluation of right lower quadrant abdominal pain.    Recent worsening of constipation, lower abdominal discomfort, and rare episodes of rectal bleeding. Right lower abdominal pain improves after she walks. Eating tomatoes may worsen her symptoms. Carbonated beverages are not something that she normally consumes, but, these will worsen her symptoms when she does. Otherwise, no charge with eating. No change in pain with defecation.   Uses Benefiber to have a bowel movements most days. No straining.  Tramadol provided to manage her pain but has had no significant relief.  She uses Acetimphen instead of NSAIDs. Has not tried OTC meds or other dietary changes.   She has a appetite. Weight is stable. Energy is "lazy."  Screening colonoscopy in 2012 with Dr. Sharlett Iles showed left-sided diverticulosis with a tight sigmoid lumen and associated stenosis.  The exam was otherwise normal.  Repeat exam recommended in 10 years.  She is eager for endoscopic evaluation for reassurance.   Labs 03/09/21: normal CMP except for creatine 0/90, calcium 10.8; normal CBC  No known family history of colon cancer or polyps. No family history of uterine/endometrial cancer, pancreatic cancer or gastric/stomach cancer.   Past Medical History:  Diagnosis Date   Allergic rhinitis    Arthritis    Bronchiectasis ([place])    Colon polyps    Diverticulosis    Emphysema of lung ([place])    GERD (gastroesophageal reflux disease)    HSV-1 (herpes simplex  virus 1) infection    Hypertension    Pulmonary nodule 07/10/2017   CT 01/2017, CT 07/2017 - stable on follow up, no further follow up recommended.    Past Surgical History:  Procedure Laterality Date   ABDOMINAL HYSTERECTOMY     ovaries spared   CESAREAN SECTION  1969   COLONOSCOPY     DIRECT LARYNGOSCOPY N/A 10/21/2014   Procedure: DIRECT LARYNGOSCOPY;  Surgeon: Rozetta Nunnery, MD;  Location: [facility];  Service: ENT;  Laterality: N/A;   EXCISION NASAL MASS Right 10/21/2014   Procedure: EXCISION NASOPHARYNGEAL MASS;  Surgeon: Rozetta Nunnery, MD;  Location: [facility];  Service: ENT;  Laterality: Right;   EYE SURGERY     cataracts   MASS EXCISION Right 10/21/2014   Procedure: RIGHT NECK NODE BIOPSY;  Surgeon: Rozetta Nunnery, MD;  Location: [facility];  Service: ENT;  Laterality: Right;    Current Outpatient Medications  Medication Sig Dispense Refill   acetaminophen (TYLENOL) 500 MG tablet Take 500 mg by mouth every 6 (six) hours as needed (pain).     amLODipine (NORVASC) 10 MG tablet TAKE 1 TABLET BY MOUTH EVERY DAY FOR BLOOD PRESSURE 90 tablet 3   Ascorbic Acid (VITAMIN C) 100 MG tablet Take 100 mg by mouth daily.     aspirin EC 81 MG tablet Take 81 mg by mouth daily.     cholecalciferol (VITAMIN D) 1000 units tablet Take 6,000 Units by mouth daily after lunch.      famotidine (PEPCID) 40 MG tablet Take  1 tablet  Daily  to  Prevent Heartburn & Indigestion 90 tablet 3   Magnesium 250 MG TABS Take 1 tablet (250 mg total) by mouth daily. 30 tablet 0   Naphazoline HCl (CLEAR EYES OP) Place 1 drop into both eyes daily.     rosuvastatin (CRESTOR) 5 MG tablet TAKE 1 TAB IN THE EVENING 3 DAYS A WEEK (MWF) FOR CHOLESTEROL TO REDUCE HEART ATTACK/STROKE RISK. 36 tablet 1   traMADol (ULTRAM) 50 MG tablet Take 1 tablet (50 mg total) by mouth every 12 (twelve) hours as needed for moderate pain. 1 pill twice daily as needed for pain 60  tablet 0   zinc gluconate 50 MG tablet Take 50 mg by mouth daily.     azelastine (ASTELIN) 0.1 % nasal spray      cloNIDine (CATAPRES) 0.2 MG tablet 0.2 mg as needed.     Flaxseed, Linseed, (FLAX SEED OIL) 1000 MG CAPS Take 1,000 mg by mouth daily after lunch. (Patient not taking: No sig reported)     halobetasol (ULTRAVATE) 0.05 % cream Apply topically 2 (two) times daily. Apply to Eczema Rash 2 x/day (Patient not taking: Reported on 07/04/2021) 100 g 11   hydrocortisone 2.5 % cream  (Patient not taking: Reported on 07/04/2021)     nitroGLYCERIN (NITROSTAT) 0.4 MG SL tablet Place 0.4 mg under the tongue every 5 (five) minutes as needed for chest pain. (Patient not taking: Reported on 07/04/2021)     OVER THE COUNTER MEDICATION Takes OTC eczema cream PRN. (Patient not taking: Reported on 07/04/2021)     pantoprazole (PROTONIX) 40 MG tablet Take 1 tablet (40 mg total) by mouth every morning. (Patient not taking: Reported on 07/04/2021) 90 tablet 3   triamterene-hydrochlorothiazide (MAXZIDE-25) 37.5-25 MG tablet TAKE 1 TABLET DAILY FOR BP & FLUID (Patient not taking: Reported on 07/04/2021) 90 tablet 3   Wheat Dextrin (BENEFIBER PO) Take 1 Dose by mouth daily. (Patient not taking: Reported on 07/04/2021)     Current Facility-Administered Medications  Medication Dose Route Frequency Provider Last Rate Last Admin   0.9 %  sodium chloride infusion  500 mL Intravenous Once Thornton Park, MD        Allergies as of 07/04/2021 - Review Complete 07/04/2021  Allergen Reaction Noted   Ace inhibitors Cough 08/12/2018   Lemon oil Hives 11/30/2013   Natural vegetable orange [psyllium]  06/16/2020   Shellfish allergy Swelling and Hives 08/18/2013    Family History  Problem Relation Age of Onset   Hypertension Mother    Stroke Mother    Heart attack Father    Breast cancer Sister    Cancer Brother    Prostate cancer Brother    Other Brother        Heart Issues   Cancer Brother    Breast  cancer Maternal Grandmother    Lung disease Neg Hx    Rheumatologic disease Neg Hx    Colon cancer Neg Hx    Esophageal cancer Neg Hx    Pancreatic cancer Neg Hx    Liver cancer Neg Hx      Physical Exam: General:   Alert,  well-nourished, pleasant and cooperative in NAD Head:  Normocephalic and atraumatic. Eyes:  Sclera clear, no icterus.   Conjunctiva pink. Ears:  Normal auditory acuity. Nose:  No deformity, discharge,  or lesions. Mouth:  No deformity or lesions.   Neck:  Supple; no masses or thyromegaly. Lungs:  Clear throughout to auscultation.   No wheezes. Heart:  Regular rate and rhythm; no  murmurs. Abdomen:  Soft, nontender, nondistended, normal bowel sounds, no rebound or guarding. No hepatosplenomegaly.   Rectal:  Deferred  Msk:  Symmetrical. No boney deformities LAD: No inguinal or umbilical LAD Extremities:  No clubbing or edema. Neurologic:  Alert and  oriented x4;  grossly nonfocal Skin:  Intact without significant lesions or rashes. Psych:  Alert and cooperative. Normal mood and affect.      Eian Vandervelden L. Tarri Glenn, MD, MPH 07/04/2021, 11:19 AM

## 2021-07-04 NOTE — Op Note (Signed)
[place] Patient Name: Catherine Parks Procedure Date: 07/04/2021 11:19 AM MRN: [id number] Endoscopist: Thornton Park MD, MD Age: 80 Referring MD:  Date of Birth: 10-28-1940 Gender: Female Account #: [id number] Procedure:                Upper GI endoscopy Indications:              Right lower quadrant abdominal pain Medicines:                Monitored Anesthesia Care Procedure:                Pre-Anesthesia Assessment:                           - Prior to the procedure, a History and Physical                            was performed, and patient medications and                            allergies were reviewed. The patient's tolerance of                            previous anesthesia was also reviewed. The risks                            and benefits of the procedure and the sedation                            options and risks were discussed with the patient.                            All questions were answered, and informed consent                            was obtained. Prior Anticoagulants: The patient has                            taken no previous anticoagulant or antiplatelet                            agents. ASA Grade Assessment: II - A patient with                            mild systemic disease. After reviewing the risks                            and benefits, the patient was deemed in                            satisfactory condition to undergo the procedure.                           After obtaining informed consent, the endoscope was  passed under direct vision. Throughout the                            procedure, the patient's blood pressure, pulse, and                            oxygen saturations were monitored continuously. The                            Endoscope was introduced through the mouth, and                            advanced to the third part of duodenum. The upper                            GI endoscopy  was accomplished without difficulty.                            The patient tolerated the procedure well. Scope In: Scope Out: Findings:                 The examined esophagus was normal.                           A medium-sized hiatal hernia was present.                           Patchy minimal inflammation characterized by                            erythema, friability and granularity was found in                            the gastric body. Biopsies were taken with a cold                            forceps for histology. Estimated blood loss was                            minimal.                           Patchy mildly erythematous mucosa without active                            bleeding and with no stigmata of bleeding was found                            in the duodenal bulb. There is a small nodule in                            the duodenal bulb. Biopsies were taken from the                            nodule in  a tunneled fashion with a cold forceps                            for histology. Biopies were also taken from the                            bulb and second portion of the duodenum. Estimated                            blood loss was minimal.                           The exam was otherwise without abnormality. Complications:            No immediate complications. Estimated blood loss:                            Minimal. Estimated Blood Loss:     Estimated blood loss was minimal. Impression:               - Normal esophagus.                           - Medium-sized hiatal hernia.                           - Gastritis. Biopsied.                           - Erythematous duodenopathy. Biopsied.                           - Small duodenal nodule. Biopsied.                           - The examination was otherwise normal. Recommendation:           - Patient has a contact number available for                            emergencies. The signs and symptoms of potential                             delayed complications were discussed with the                            patient. Return to normal activities tomorrow.                            Written discharge instructions were provided to the                            patient.                           - Resume previous diet.                           -  Continue present medications. Increase                            pantoprazole to 40 mg BID.                           - Await pathology results.                           - No aspirin, ibuprofen, naproxen, or other                            non-steroidal anti-inflammatory drugs.                           - Office follow-up in 1-2 months, earlier if needed. Thornton Park MD, MD 07/04/2021 12:02:17 PM This report has been signed electronically.

## 2021-07-04 NOTE — Patient Instructions (Signed)
Continue present medications. Follow a high fiber diet. Drink at least 64 ounces of water daily. Add a daily stool bulking agent such as psyllium. Increase Pantoprazole to 40 mg twice daily. Await pathology results. No aspirin, ibuprofen, naproxen or other non-steroidal anti-inflammatory drugs. Office follow up in 1-2 months, earlier if needed. YOU HAD AN ENDOSCOPIC PROCEDURE TODAY AT [facility]:   Refer to the procedure report that was given to you for any specific questions about what was found during the examination.  If the procedure report does not answer your questions, please call your gastroenterologist to clarify.  If you requested that your care partner not be given the details of your procedure findings, then the procedure report has been included in a sealed envelope for you to review at your convenience later.  YOU SHOULD EXPECT: Some feelings of bloating in the abdomen. Passage of more gas than usual.  Walking can help get rid of the air that was put into your GI tract during the procedure and reduce the bloating. If you had a lower endoscopy (such as a colonoscopy or flexible sigmoidoscopy) you may notice spotting of blood in your stool or on the toilet paper. If you underwent a bowel prep for your procedure, you may not have a normal bowel movement for a few days.  Please Note:  You might notice some irritation and congestion in your nose or some drainage.  This is from the oxygen used during your procedure.  There is no need for concern and it should clear up in a day or so.  SYMPTOMS TO REPORT IMMEDIATELY:  Following lower endoscopy (colonoscopy or flexible sigmoidoscopy):  Excessive amounts of blood in the stool  Significant tenderness or worsening of abdominal pains  Swelling of the abdomen that is new, acute  Fever of 100F or higher  Following upper endoscopy (EGD)  Vomiting of blood or coffee ground material  New chest pain or pain under the shoulder  blades  Painful or persistently difficult swallowing  New shortness of breath  Fever of 100F or higher  Black, tarry-looking stools  For urgent or emergent issues, a gastroenterologist can be reached at any hour by calling [phone]. Do not use MyChart messaging for urgent concerns.    DIET:  We do recommend a small meal at first, but then you may proceed to your regular diet.  Drink plenty of fluids but you should avoid alcoholic beverages for 24 hours.  ACTIVITY:  You should plan to take it easy for the rest of today and you should NOT DRIVE or use heavy machinery until tomorrow (because of the sedation medicines used during the test).    FOLLOW UP: Our staff will call the number listed on your records 48-72 hours following your procedure to check on you and address any questions or concerns that you may have regarding the information given to you following your procedure. If we do not reach you, we will leave a message.  We will attempt to reach you two times.  During this call, we will ask if you have developed any symptoms of COVID 19. If you develop any symptoms (ie: fever, flu-like symptoms, shortness of breath, cough etc.) before then, please call [phone].  If you test positive for Covid 19 in the 2 weeks post procedure, please call and report this information to [place].    If any biopsies were taken you will be contacted by phone or by letter within the next 1-3 weeks.  Please call us at [phone] if you have not heard about the biopsies in 3 weeks.    SIGNATURES/CONFIDENTIALITY: You and/or your care partner have signed paperwork which will be entered into your electronic medical record.  These signatures attest to the fact that that the information above on your After Visit Summary has been reviewed and is understood.  Full responsibility of the confidentiality of this discharge information lies with you and/or your care-partner.

## 2021-07-04 NOTE — Progress Notes (Signed)
Pt's states no medical or surgical changes since previsit or office visit. VS assessed by C.W 

## 2021-07-04 NOTE — Op Note (Addendum)
[facility] Patient Name: Catherine Parks Procedure Date: 07/04/2021 11:19 AM MRN: [id number] Endoscopist: Thornton Park MD, MD Age: 80 Referring MD:  Date of Birth: 05-17-1941 Gender: Female Account #: [id number] Procedure:                Colonoscopy Indications:              Abdominal pain in the right lower quadrant Medicines:                Monitored Anesthesia Care Procedure:                Pre-Anesthesia Assessment:                           - Prior to the procedure, a History and Physical                            was performed, and patient medications and                            allergies were reviewed. The patient's tolerance of                            previous anesthesia was also reviewed. The risks                            and benefits of the procedure and the sedation                            options and risks were discussed with the patient.                            All questions were answered, and informed consent                            was obtained. Prior Anticoagulants: The patient has                            taken no previous anticoagulant or antiplatelet                            agents. ASA Grade Assessment: II - A patient with                            mild systemic disease. After reviewing the risks                            and benefits, the patient was deemed in                            satisfactory condition to undergo the procedure.                           After obtaining informed consent, the colonoscope  was passed under direct vision. Throughout the                            procedure, the patient's blood pressure, pulse, and                            oxygen saturations were monitored continuously. The                            Olympus PCF-H190DL (#[id number]) Colonoscope was                            introduced through the anus and advanced to the 5                            cm into the  ileum. A second forward view of the                            right colon was performed. The colonoscopy was                            performed without difficulty. The patient tolerated                            the procedure well. The quality of the bowel                            preparation was good. The terminal ileum, ileocecal                            valve, appendiceal orifice, and rectum were                            photographed. Scope In: 11:41:39 AM Scope Out: 11:57:14 AM Scope Withdrawal Time: 0 hours 10 minutes 1 second  Total Procedure Duration: 0 hours 15 minutes 35 seconds  Findings:                 Non-bleeding external and internal hemorrhoids were                            found.                           Multiple small and large-mouthed diverticula were                            found in the sigmoid colon, descending colon and                            ascending colon. The diverticulosis is most                            extensive in the sigmoid colon.  A 2 mm polyp was found in the ascending colon. The                            polyp was sessile. The polyp was removed with a                            cold snare. Resection and retrieval were complete.                            Estimated blood loss was minimal.                           The terminal ileum contained a single small                            diverticulum.                           The exam was otherwise without abnormality on                            direct and retroflexion views. Complications:            No immediate complications. Estimated blood loss:                            Minimal. Estimated Blood Loss:     Estimated blood loss was minimal. Impression:               - Non-bleeding external and internal hemorrhoids.                           - Diverticulosis in the sigmoid colon, in the                            descending colon and in the ascending  colon.                           - One 2 mm polyp in the ascending colon, removed                            with a cold snare. Resected and retrieved.                           - The examination was otherwise normal on direct                            and retroflexion views. Recommendation:           - Patient has a contact number available for                            emergencies. The signs and symptoms of potential  delayed complications were discussed with the                            patient. Return to normal activities tomorrow.                            Written discharge instructions were provided to the                            patient.                           - Continue present medications.                           - Await pathology results. No surveillance                            colonoscopy recommended given her age.                           - Follow a high fiber diet. Drink at least 64                            ounces of water daily. Add a daily stool bulking                            agent such as psyllium (an exampled would be                            Metamucil).                           - Emerging evidence supports eating a diet of                            fruits, vegetables, grains, calcium, and yogurt                            while reducing red meat and alcohol may reduce the                            risk of colon cancer. Thornton Park MD, MD 07/04/2021 12:06:44 PM This report has been signed electronically.

## 2021-07-08 ENCOUNTER — Encounter: Payer: Self-pay | Admitting: Gastroenterology

## 2021-07-13 ENCOUNTER — Telehealth: Payer: Self-pay

## 2021-07-13 NOTE — Telephone Encounter (Signed)
Per Dr. Tarri Glenn, pt will require 1-2 mo f/u appt; s/p double 07/04/21, eval efficacy of Pantoprazole. LVM requesting returned call.  Pathology letter mailed

## 2021-07-16 NOTE — Telephone Encounter (Signed)
Next Appt With Gastroenterology Thornton Park, MD)09/19/2021 at 10:30 AM Future appointments:

## 2021-07-24 ENCOUNTER — Ambulatory Visit
Admission: RE | Admit: 2021-07-24 | Discharge: 2021-07-24 | Disposition: A | Discharge status: Home / Self Care | Payer: Medicare PPO | Source: Ambulatory Visit | Attending: Internal Medicine | Admitting: Internal Medicine

## 2021-07-24 DIAGNOSIS — J432 Centrilobular emphysema: Secondary | ICD-10-CM | POA: Diagnosis not present

## 2021-07-24 DIAGNOSIS — J849 Interstitial pulmonary disease, unspecified: Secondary | ICD-10-CM

## 2021-07-24 DIAGNOSIS — S22080A Wedge compression fracture of T11-T12 vertebra, initial encounter for closed fracture: Secondary | ICD-10-CM | POA: Diagnosis not present

## 2021-07-24 DIAGNOSIS — J479 Bronchiectasis, uncomplicated: Secondary | ICD-10-CM | POA: Diagnosis not present

## 2021-07-24 DIAGNOSIS — I2510 Atherosclerotic heart disease of native coronary artery without angina pectoris: Secondary | ICD-10-CM | POA: Diagnosis not present

## 2021-07-24 NOTE — Progress Notes (Signed)
3 MONTH FOLLOW UP  Assessment:    Essential hypertension Continue medication; labile; well controlled at home; take clonidine PRN Monitor blood pressure at home; call if consistently over 130/80 Continue DASH diet.   Reminder to go to the ER if any CP, SOB, nausea, dizziness, severe HA, changes vision/speech, left arm numbness and tingling and jaw pain.  Aortic atherosclerosis ([place]) Per CT 2020 LDL goal <70 Control blood pressure, cholesterol, glucose, increase exercise.   ILD (interstitial lung disease) ([place]) Followed by Dr. Ashok Cordia, stable  COPD, mild ([place]) Followed by Dr. Ashok Cordia, mild, no inhalers, monitoring only   CKD (chronic kidney disease) stage 3, GFR 30-59 ml/min (HCC) Increase fluids, avoid NSAIDS, monitor sugars, will monitor CMP/GFR  Vitamin D deficiency Continue supplementation Check vitamin D level  Other abnormal glucose Recent A1Cs at goal Discussed diet/exercise, weight management  Defer A1C; check CMP  Medication management CBC, CMP/GFR, magnesium   Dyslipidemia Increase rosuvastatin to 5 days/week  LDL goal updated to 70 due to CAD per imaging Continue low cholesterol diet and exercise.  Check lipid panel.   Osteopenia - get dexa 06/2021 (order in system, reminded to schedule, phone number given), continue Vit D and Ca, weight bearing exercises  Abdominal pain/ Polyp of colon, unspecified part of colon, unspecified type Continue psyllium, bowel management GI follow up as scheduled  Lumbar back pain/ lumbar spondylosis Tylenol PRN, heat/ice, back exercises Declines PT referral    Need for influenza vaccine High dose quadrivalent administered without complication today     Orders Placed This Encounter  Procedures   Flu vaccine HIGH DOSE PF   CBC with Differential/Platelet   COMPLETE METABOLIC PANEL WITH GFR   Magnesium   Lipid panel   TSH     Over 40 minutes of exam, counseling, chart review and critical decision making was  performed Future Appointments  Date Time Provider [place]  09/19/2021 10:30 AM Thornton Park, MD [facility]  09/20/2021 10:00 AM [facility] PFT ROOM [facility] None  09/20/2021 11:00 AM Brand Males, MD [facility] None  12/05/2021  3:00 PM Liane Comber, NP [facility] None  03/11/2022 10:30 AM Liane Comber, NP [facility] None    Subjective:  Catherine Parks is a 80 y.o. female who presents for 3 month follow up on htn, hld, glucose management, weight, vit D def.   Patient has been followed by Dr Chase Caller for COPD/ILD and a 6 mm LLL nodule by CT scans which has remained stable in size since 1st discovered in May 2018.  Follows with pulm annually, getting low dose screening CT due to 58 pack year smoking hx, quit in 2016. Ct in 2021 showed ? PAH, emphysematous changes, ILD. ECHO 06/2020 was benign without evidence of PAH. Had recent CT 07/24/2021.    She has hx of colon polyps, diverticulosis; she had follow up colonoscopy by Dr. Tarri Glenn 07/04/2021 sowing diverticuloses and 1 polyp, no recall due to age. She was recommended increase fluid, psyllium, high fiber diet.    She has increased protonix 40 mg BID per GI instructions, has seen some improvement, has follow up in Jan with Dr. Tarri Glenn.   She has lumbar pain with moderate spondylosis on xray 11/2020, manging well with PRN tylenol. Intermittent hip and thigh aching, better when she moves more. Declines PT. Avoids NSAIDS  BMI is Body mass index is 23.73 kg/m., she has been working on diet, not exercising since covid 19 due to pulm conditions and restrictions, is back to walking twice a week.  Wt Readings from Last 3 Encounters:  07/25/21 147 lb (66.7 kg)  07/04/21 146 lb (66.2 kg)  05/04/21 146 lb (66.2 kg)   She has aortic atherosclerosis per CT 03/2019 Had stress test in 12/2017 following chest/jaw pain episode which were unremarkable -    Her blood pressure has been controlled at home (110-120s/70s),  today their BP is BP: 128/70, will take clonidine 0.1 mg PRN, typically once a week for labile BPs She does workout. She denies chest pain, shortness of breath, dizziness.   She is on cholesterol medication (rosuvastatin 5 mg 3 days a week) and denies myalgias. She has CAD per CT lungs, Her cholesterol is not at goal. Off of ASA per GI recommendation. She is doing plant based, no dairy, low fat poultry. The cholesterol last visit was:   Lab Results  Component Value Date   CHOL 192 03/09/2021   HDL 92 03/09/2021   LDLCALC 86 03/09/2021   TRIG 62 03/09/2021   CHOLHDL 2.1 03/09/2021    She has been working on diet and exercise for glucose management, and denies foot ulcerations, increased appetite, nausea, paresthesia of the feet, polydipsia, polyuria, visual disturbances, vomiting and weight loss.  Last A1C in the office was:  Lab Results  Component Value Date   HGBA1C 5.6 12/05/2020   Last GFR: Lab Results  Component Value Date   GFRAA 70 03/09/2021   Patient is on Vitamin D supplement and at goal at recent check:    Lab Results  Component Value Date   VD25OH 91 12/05/2020     Has had elevated calcium, denies supplement or tums. High normal PTH, monitoring only. Denies hx of renal stones. DEXA pending  Lab Results  Component Value Date   PTH 59 01/03/2021   CALCIUM 10.8 (H) 03/09/2021      Medication Review: Current Outpatient Medications on File Prior to Visit  Medication Sig Dispense Refill   acetaminophen (TYLENOL) 500 MG tablet Take 500 mg by mouth every 6 (six) hours as needed (pain).     amLODipine (NORVASC) 10 MG tablet TAKE 1 TABLET BY MOUTH EVERY DAY FOR BLOOD PRESSURE 90 tablet 3   Ascorbic Acid (VITAMIN C) 100 MG tablet Take 100 mg by mouth daily.     azelastine (ASTELIN) 0.1 % nasal spray      cholecalciferol (VITAMIN D) 1000 units tablet Take 6,000 Units by mouth daily after lunch.      cloNIDine (CATAPRES) 0.2 MG tablet 0.2 mg as needed.     famotidine  (PEPCID) 40 MG tablet Take  1 tablet  Daily  to Prevent Heartburn & Indigestion 90 tablet 3   Flaxseed, Linseed, (FLAX SEED OIL) 1000 MG CAPS Take 1,000 mg by mouth daily after lunch.     halobetasol (ULTRAVATE) 0.05 % cream Apply topically 2 (two) times daily. Apply to Eczema Rash 2 x/day 100 g 11   hydrocortisone 2.5 % cream      Magnesium 250 MG TABS Take 1 tablet (250 mg total) by mouth daily. 30 tablet 0   Naphazoline HCl (CLEAR EYES OP) Place 1 drop into both eyes daily.     nitroGLYCERIN (NITROSTAT) 0.4 MG SL tablet Place 0.4 mg under the tongue every 5 (five) minutes as needed for chest pain.     OVER THE COUNTER MEDICATION Takes OTC eczema cream PRN.     pantoprazole (PROTONIX) 40 MG tablet Take 1 tablet (40 mg total) by mouth 2 (two) times daily. 120 tablet 0  traMADol (ULTRAM) 50 MG tablet Take 1 tablet (50 mg total) by mouth every 12 (twelve) hours as needed for moderate pain. 1 pill twice daily as needed for pain 60 tablet 0   triamterene-hydrochlorothiazide (MAXZIDE-25) 37.5-25 MG tablet TAKE 1 TABLET DAILY FOR BP & FLUID 90 tablet 3   Wheat Dextrin (BENEFIBER PO) Take 1 Dose by mouth daily.     zinc gluconate 50 MG tablet Take 50 mg by mouth daily.     No current facility-administered medications on file prior to visit.    Allergies  Allergen Reactions   Ace Inhibitors Cough   Lemon Oil Hives    Reaction to lemons   Natural Vegetable Orange [Psyllium]     oranges   Shellfish Allergy Swelling and Hives    Current Problems (verified) Patient Active Problem List   Diagnosis Date Noted   Chronic lumbar pain 12/07/2020   Primary hyperparathyroidism ([place]) 12/06/2020   Osteopenia 12/05/2020   Former smoker 05/08/2018   Abnormal glucose (prediabetes) 05/08/2018   Hyperlipidemia, mixed 05/08/2018   Abnormal EKG 12/22/2017   Aortic atherosclerosis ([place]) 07/10/2017   ILD (interstitial lung disease) ([place]) 09/27/2016   Arthritis 08/02/2016   Chronic seasonal allergic  rhinitis 08/02/2016   COPD, mild ([place]) 06/07/2016   GERD  01/05/2015   CKD (chronic kidney disease) stage 2, GFR 60-89 ml/min 06/08/2014   Medication management 06/08/2014   Vitamin D deficiency 08/19/2013   Essential hypertension    HSV-1 (herpes simplex virus 1) infection    Diverticulosis    History of colon polyps     SURGICAL HISTORY She  has a past surgical history that includes Eye surgery; Abdominal hysterectomy; Colonoscopy; Direct laryngoscopy (N/A, 10/21/2014); Mass excision (Right, 10/21/2014); Excision nasal mass (Right, 10/21/2014); and Cesarean section (1969). FAMILY HISTORY Her family history includes Breast cancer in her maternal grandmother and sister; Cancer in her brother and brother; Heart attack in her father; Hypertension in her mother; Other in her brother; Prostate cancer in her brother; Stroke in her mother. SOCIAL HISTORY She  reports that she quit smoking about 6 years ago. Her smoking use included cigarettes. She started smoking about 64 years ago. She has a 58.00 pack-year smoking history. She has never used smokeless tobacco. She reports that she does not drink alcohol and does not use drugs.   Review of Systems  Constitutional:  Negative for malaise/fatigue and weight loss.  HENT:  Negative for hearing loss and tinnitus.   Eyes:  Negative for blurred vision and double vision.  Respiratory:  Negative for cough, sputum production, shortness of breath and wheezing.   Cardiovascular:  Negative for chest pain, palpitations, orthopnea, claudication, leg swelling and PND.  Gastrointestinal:  Positive for constipation (ntermittent, takes phsyllium and PRN miralax). Negative for abdominal pain, blood in stool, diarrhea, heartburn, melena, nausea and vomiting.  Genitourinary: Negative.   Musculoskeletal:  Positive for myalgias (intermittent hips and thighs). Negative for back pain (improved), falls and joint pain.  Skin:  Negative for rash.  Neurological:  Negative for  dizziness, tingling, sensory change, weakness and headaches.  Endo/Heme/Allergies:  Negative for polydipsia.  Psychiatric/Behavioral: Negative.  Negative for depression, memory loss, substance abuse and suicidal ideas. The patient is not nervous/anxious and does not have insomnia.   All other systems reviewed and are negative.   Objective:     Today's Vitals   07/25/21 1038  BP: 128/70  Pulse: 76  Temp: 97.9 F (36.6 C)  SpO2: 99%  Weight: 147 lb (66.7 kg)  Body mass index is 23.73 kg/m.  General appearance: alert, no distress, WD/WN, female HEENT: normocephalic, sclerae anicteric, TMs pearly, nares patent, no discharge or erythema, pharynx normal Oral cavity: MMM, no lesions Neck: supple, no lymphadenopathy, no thyromegaly, no masses Heart: RRR, normal S1, S2, no murmurs Lungs: CTA bilaterally, no wheezes, rhonchi, or rales Abdomen: +bs, soft, non tender, non distended, no masses, no hepatomegaly, no splenomegaly Musculoskeletal: nontender, no swelling, no obvious deformity. Intact lumbar ROM without pain, neg straight leg raise, non-tender.  Extremities: no edema, no cyanosis, no clubbing Pulses: 2+ symmetric, upper and lower extremities, normal cap refill Neurological: alert, oriented x 3, CN2-12 intact, strength normal upper extremities and lower extremities, sensation normal throughout, DTRs 2+ throughout, no cerebellar signs, gait normal Psychiatric: normal affect, behavior normal, pleasant     Izora Ribas, NP   07/25/2021

## 2021-07-25 ENCOUNTER — Encounter: Payer: Self-pay | Admitting: Adult Health

## 2021-07-25 ENCOUNTER — Ambulatory Visit (INDEPENDENT_AMBULATORY_CARE_PROVIDER_SITE_OTHER): Payer: Medicare PPO | Admitting: Adult Health

## 2021-07-25 ENCOUNTER — Other Ambulatory Visit: Payer: Self-pay

## 2021-07-25 VITALS — BP 128/70 | HR 76 | Temp 97.9°F | Wt 147.0 lb

## 2021-07-25 DIAGNOSIS — J449 Chronic obstructive pulmonary disease, unspecified: Secondary | ICD-10-CM | POA: Diagnosis not present

## 2021-07-25 DIAGNOSIS — E782 Mixed hyperlipidemia: Secondary | ICD-10-CM

## 2021-07-25 DIAGNOSIS — J849 Interstitial pulmonary disease, unspecified: Secondary | ICD-10-CM | POA: Diagnosis not present

## 2021-07-25 DIAGNOSIS — R7309 Other abnormal glucose: Secondary | ICD-10-CM

## 2021-07-25 DIAGNOSIS — Z6823 Body mass index (BMI) 23.0-23.9, adult: Secondary | ICD-10-CM

## 2021-07-25 DIAGNOSIS — E210 Primary hyperparathyroidism: Secondary | ICD-10-CM | POA: Diagnosis not present

## 2021-07-25 DIAGNOSIS — Z23 Encounter for immunization: Secondary | ICD-10-CM

## 2021-07-25 DIAGNOSIS — Z79899 Other long term (current) drug therapy: Secondary | ICD-10-CM

## 2021-07-25 DIAGNOSIS — I10 Essential (primary) hypertension: Secondary | ICD-10-CM

## 2021-07-25 DIAGNOSIS — E559 Vitamin D deficiency, unspecified: Secondary | ICD-10-CM

## 2021-07-25 DIAGNOSIS — I700 Atherosclerosis of aorta: Secondary | ICD-10-CM | POA: Diagnosis not present

## 2021-07-25 MED ORDER — ROSUVASTATIN CALCIUM 5 MG PO TABS: ORAL_TABLET | ORAL | 3 refills | Status: DC

## 2021-07-25 NOTE — Patient Instructions (Addendum)
HOW TO SCHEDULE YOUR BONE DESNTIY  [facility] Schedule an appointment by calling [phone].     Back Exercises These exercises help to make your trunk and back strong. They also help to keep the lower back flexible. Doing these exercises can help to prevent or lessen pain in your lower back. If you have back pain, try to do these exercises 2-3 times each day or as told by your doctor. As you get better, do the exercises once each day. Repeat the exercises more often as told by your doctor. To stop back pain from coming back, do the exercises once each day, or as told by your doctor. Do exercises exactly as told by your doctor. Stop right away if you feel sudden pain or your pain gets worse. Exercises Single knee to chest Do these steps 3-5 times in a row for each leg: Lie on your back on a firm bed or the floor with your legs stretched out. Bring one knee to your chest. Grab your knee or thigh with both hands and hold it in place. Pull on your knee until you feel a gentle stretch in your lower back or butt. Keep doing the stretch for 10-30 seconds. Slowly let go of your leg and straighten it. Pelvic tilt Do these steps 5-10 times in a row: Lie on your back on a firm bed or the floor with your legs stretched out. Bend your knees so they point up to the ceiling. Your feet should be flat on the floor. Tighten your lower belly (abdomen) muscles to press your lower back against the floor. This will make your tailbone point up to the ceiling instead of pointing down to your feet or the floor. Stay in this position for 5-10 seconds while you gently tighten your muscles and breathe evenly. Cat-cow Do these steps until your lower back bends more easily: Get on your hands and knees on a firm bed or the floor. Keep your hands under your shoulders, and keep your knees under your hips. You may put padding under your knees. Let your head hang down toward your chest. Tighten (contract) the  muscles in your belly. Point your tailbone toward the floor so your lower back becomes rounded like the back of a cat. Stay in this position for 5 seconds. Slowly lift your head. Let the muscles of your belly relax. Point your tailbone up toward the ceiling so your back forms a sagging arch like the back of a cow. Stay in this position for 5 seconds.  Press-ups Do these steps 5-10 times in a row: Lie on your belly (face-down) on a firm bed or the floor. Place your hands near your head, about shoulder-width apart. While you keep your back relaxed and keep your hips on the floor, slowly straighten your arms to raise the top half of your body and lift your shoulders. Do not use your back muscles. You may change where you place your hands to make yourself more comfortable. Stay in this position for 5 seconds. Keep your back relaxed. Slowly return to lying flat on the floor.  Bridges Do these steps 10 times in a row: Lie on your back on a firm bed or the floor. Bend your knees so they point up to the ceiling. Your feet should be flat on the floor. Your arms should be flat at your sides, next to your body. Tighten your butt muscles and lift your butt off the floor until your waist is almost as  high as your knees. If you do not feel the muscles working in your butt and the back of your thighs, slide your feet 1-2 inches (2.5-5 cm) farther away from your butt. Stay in this position for 3-5 seconds. Slowly lower your butt to the floor, and let your butt muscles relax. If this exercise is too easy, try doing it with your arms crossed over your chest. Belly crunches Do these steps 5-10 times in a row: Lie on your back on a firm bed or the floor with your legs stretched out. Bend your knees so they point up to the ceiling. Your feet should be flat on the floor. Cross your arms over your chest. Tip your chin a little bit toward your chest, but do not bend your neck. Tighten your belly muscles and  slowly raise your chest just enough to lift your shoulder blades a tiny bit off the floor. Avoid raising your body higher than that because it can put too much stress on your lower back. Slowly lower your chest and your head to the floor. Back lifts Do these steps 5-10 times in a row: Lie on your belly (face-down) with your arms at your sides, and rest your forehead on the floor. Tighten the muscles in your legs and your butt. Slowly lift your chest off the floor while you keep your hips on the floor. Keep the back of your head in line with the curve in your back. Look at the floor while you do this. Stay in this position for 3-5 seconds. Slowly lower your chest and your face to the floor. Contact a doctor if: Your back pain gets a lot worse when you do an exercise. Your back pain does not get better within 2 hours after you exercise. If you have any of these problems, stop doing the exercises. Do not do them again unless your doctor says it is okay. Get help right away if: You have sudden, very bad back pain. If this happens, stop doing the exercises. Do not do them again unless your doctor says it is okay. This information is not intended to replace advice given to you by your health care provider. Make sure you discuss any questions you have with your health care provider. Document Revised: 11/15/2020 Document Reviewed: 11/15/2020 Elsevier Patient Education  [place].

## 2021-07-26 ENCOUNTER — Other Ambulatory Visit: Payer: Self-pay | Admitting: Adult Health

## 2021-07-26 ENCOUNTER — Encounter: Payer: Self-pay | Admitting: Adult Health

## 2021-07-26 LAB — TSH: TSH: 1.53 mIU/L (ref 0.40–4.50)

## 2021-07-26 LAB — CBC WITH DIFFERENTIAL/PLATELET
Absolute Monocytes: 583 cells/uL (ref 200–950)
Basophils Absolute: 39 cells/uL (ref 0–200)
Basophils Relative: 0.8 %
Eosinophils Absolute: 299 cells/uL (ref 15–500)
Eosinophils Relative: 6.1 %
HCT: 40.6 % (ref 35.0–45.0)
Hemoglobin: 13.5 g/dL (ref 11.7–15.5)
Lymphs Abs: 1558 cells/uL (ref 850–3900)
MCH: 29.5 pg (ref 27.0–33.0)
MCHC: 33.3 g/dL (ref 32.0–36.0)
MCV: 88.6 fL (ref 80.0–100.0)
MPV: 9.3 fL (ref 7.5–12.5)
Monocytes Relative: 11.9 %
Neutro Abs: 2421 cells/uL (ref 1500–7800)
Neutrophils Relative %: 49.4 %
Platelets: 405 10*3/uL — ABNORMAL HIGH (ref 140–400)
RBC: 4.58 10*6/uL (ref 3.80–5.10)
RDW: 14 % (ref 11.0–15.0)
Total Lymphocyte: 31.8 %
WBC: 4.9 10*3/uL (ref 3.8–10.8)

## 2021-07-26 LAB — COMPLETE METABOLIC PANEL WITH GFR
AG Ratio: 1.2 (calc) (ref 1.0–2.5)
ALT: 13 U/L (ref 6–29)
AST: 15 U/L (ref 10–35)
Albumin: 4.3 g/dL (ref 3.6–5.1)
Alkaline phosphatase (APISO): 82 U/L (ref 37–153)
BUN: 15 mg/dL (ref 7–25)
CO2: 27 mmol/L (ref 20–32)
Calcium: 11 mg/dL — ABNORMAL HIGH (ref 8.6–10.4)
Chloride: 101 mmol/L (ref 98–110)
Creat: 0.92 mg/dL (ref 0.60–0.95)
Globulin: 3.6 g/dL (calc) (ref 1.9–3.7)
Glucose, Bld: 88 mg/dL (ref 65–99)
Potassium: 4.2 mmol/L (ref 3.5–5.3)
Sodium: 140 mmol/L (ref 135–146)
Total Bilirubin: 0.4 mg/dL (ref 0.2–1.2)
Total Protein: 7.9 g/dL (ref 6.1–8.1)
eGFR: 63 mL/min/{1.73_m2} (ref >=60)

## 2021-07-26 LAB — MAGNESIUM: Magnesium: 2.2 mg/dL (ref 1.5–2.5)

## 2021-07-26 LAB — LIPID PANEL
Cholesterol: 182 mg/dL (ref <200)
HDL: 82 mg/dL (ref >=50)
LDL Cholesterol (Calc): 85 mg/dL (calc)
Non-HDL Cholesterol (Calc): 100 mg/dL (calc) (ref <130)
Total CHOL/HDL Ratio: 2.2 (calc) (ref <5.0)
Triglycerides: 66 mg/dL (ref <150)

## 2021-08-26 ENCOUNTER — Other Ambulatory Visit: Payer: Self-pay | Admitting: Gastroenterology

## 2021-08-26 DIAGNOSIS — R1031 Right lower quadrant pain: Secondary | ICD-10-CM

## 2021-08-26 DIAGNOSIS — G8929 Other chronic pain: Secondary | ICD-10-CM

## 2021-09-15 ENCOUNTER — Other Ambulatory Visit: Payer: Self-pay | Admitting: Internal Medicine

## 2021-09-19 ENCOUNTER — Other Ambulatory Visit (INDEPENDENT_AMBULATORY_CARE_PROVIDER_SITE_OTHER): Payer: Medicare PPO

## 2021-09-19 ENCOUNTER — Encounter: Payer: Self-pay | Admitting: Gastroenterology

## 2021-09-19 ENCOUNTER — Ambulatory Visit: Payer: Medicare PPO | Admitting: Gastroenterology

## 2021-09-19 VITALS — BP 142/88 | HR 78 | Ht 66.0 in | Wt 149.1 lb

## 2021-09-19 DIAGNOSIS — R1031 Right lower quadrant pain: Secondary | ICD-10-CM

## 2021-09-19 DIAGNOSIS — G8929 Other chronic pain: Secondary | ICD-10-CM | POA: Diagnosis not present

## 2021-09-19 DIAGNOSIS — R194 Change in bowel habit: Secondary | ICD-10-CM

## 2021-09-19 LAB — BASIC METABOLIC PANEL
BUN: 18 mg/dL (ref 6–23)
CO2: 28 mEq/L (ref 19–32)
Calcium: 11.1 mg/dL — ABNORMAL HIGH (ref 8.4–10.5)
Chloride: 102 mEq/L (ref 96–112)
Creatinine, Ser: 0.97 mg/dL (ref 0.40–1.20)
GFR: 55.04 mL/min — ABNORMAL LOW (ref >60.00)
Glucose, Bld: 97 mg/dL (ref 70–99)
Potassium: 3.8 mEq/L (ref 3.5–5.1)
Sodium: 139 mEq/L (ref 135–145)

## 2021-09-19 NOTE — Progress Notes (Signed)
Referring Provider: Unk Pinto, MD Primary Care Physician:  Unk Pinto, MD  Chief complaint: Right lower quadrant discomfort   IMPRESSION:  Right lower quadrant abdominal pain not explained by EGD or colonoscopy Altered bowel habits Intermittent rectal bleeding, now resolved, likely due to hemorrhoids Hypercalcemia, unclear if this is been previously evaluated by her primary care provider  PLAN: - Continue pantropazole 40 mg BID - CT abd/pelvis with contrast to further evaluate her abdominal pain - Consider adding a daily dose of Miralax  - Follow-up with Dr. Melford Aase regarding hypercalcemia  Please see the "Patient Instructions" section for addition details about the plan.  HPI: Catherine Parks is a 81 y.o. female initially referred by Liane Comber for further evaluation of right lower quadrant abdominal pain in the setting of worsening of constipation, lower abdominal discomfort, and rare episodes of rectal bleeding. Right lower abdominal pain improves after she walks.  Good appetite.  Weight stable.  Uses Benefiber to have a bowel movements most days. No straining.  Tramadol provided to manage her pain but has had no significant relief.  She uses acetaminophen instead of NSAIDs. Has not tried OTC meds or other dietary changes.   Labs 03/09/21: normal CMP except for creatine 0/90, calcium 10.8; normal CBC  Endoscopic evaluation was performed 07/04/2021.  Upper endoscopy showed a mild size hiatal hernia, H. pylori negative gastritis, and normal duodenal biopsies.  Colonoscopy revealed internal and external hemorrhoids, pancolonic diverticulosis, and a small tubular adenoma.  The source of her symptoms was not identified on the studies.  TSH normal 07/25/2021 Calcium persistently elevated at 11.0 on labs 07/25/2021 Platelets of 405 otherwise normal CBC 07/25/2021  Returns today in scheduled follow-up. Continues to have some abdominal pain. Worsened by eating acidic  foods.  Using Benefiber and high fiber diet. Having a bowel movement most daily.  She gained 5 pounds over Christmas.  She remains concerned about the source of pain.  She follows a plant-based, no dairy, low at [employer].  No prior abdominal imaging  Endoscopic history: - Screening colonoscopy in 2012 with Dr. Sharlett Iles showed left-sided diverticulosis with a tight sigmoid lumen and associated stenosis.  The exam was otherwise normal.  Repeat exam recommended in 10 years. -Colonoscopy 07/04/2021: Internal and external hemorrhoids, pancolonic diverticulosis, a small ascending colon tubular adenoma -EGD 07/04/2021 showed a mild sized hiatal hernia, gastritis, and duodenitis.  Biopsies negative for H. pylori.  Duodenal biopsies normal.  Past Medical History:  Diagnosis Date   Allergic rhinitis    Arthritis    Bronchiectasis ([place])    Colon polyps    Diverticulosis    Emphysema of lung ([place])    GERD (gastroesophageal reflux disease)    HSV-1 (herpes simplex virus 1) infection    Hypertension    Pulmonary nodule 07/10/2017   CT 01/2017, CT 07/2017 - stable on follow up, no further follow up recommended.    Past Surgical History:  Procedure Laterality Date   ABDOMINAL HYSTERECTOMY     ovaries spared   CESAREAN SECTION  1969   COLONOSCOPY     DIRECT LARYNGOSCOPY N/A 10/21/2014   Procedure: DIRECT LARYNGOSCOPY;  Surgeon: Rozetta Nunnery, MD;  Location: [facility];  Service: ENT;  Laterality: N/A;   EXCISION NASAL MASS Right 10/21/2014   Procedure: EXCISION NASOPHARYNGEAL MASS;  Surgeon: Rozetta Nunnery, MD;  Location: [facility];  Service: ENT;  Laterality: Right;   EYE SURGERY     cataracts   MASS EXCISION  Right 10/21/2014   Procedure: RIGHT NECK NODE BIOPSY;  Surgeon: Rozetta Nunnery, MD;  Location: [facility];  Service: ENT;  Laterality: Right;    Current Outpatient Medications  Medication Sig Dispense Refill    acetaminophen (TYLENOL) 500 MG tablet Take 500 mg by mouth every 6 (six) hours as needed (pain).     amLODipine (NORVASC) 10 MG tablet TAKE 1 TABLET BY MOUTH EVERY DAY FOR BLOOD PRESSURE 90 tablet 3   Ascorbic Acid (VITAMIN C) 100 MG tablet Take 100 mg by mouth daily.     azelastine (ASTELIN) 0.1 % nasal spray      cholecalciferol (VITAMIN D) 1000 units tablet Take 6,000 Units by mouth daily after lunch.      cloNIDine (CATAPRES) 0.2 MG tablet 0.2 mg as needed.     famotidine (PEPCID) 40 MG tablet Take  1 tablet  Daily  to Prevent Heartburn & Indigestion 90 tablet 3   Flaxseed, Linseed, (FLAX SEED OIL) 1000 MG CAPS Take 1,000 mg by mouth daily after lunch.     halobetasol (ULTRAVATE) 0.05 % cream Apply topically 2 (two) times daily. Apply to Eczema Rash 2 x/day 100 g 11   hydrocortisone 2.5 % cream      Magnesium 250 MG TABS Take 1 tablet (250 mg total) by mouth daily. 30 tablet 0   Naphazoline HCl (CLEAR EYES OP) Place 1 drop into both eyes daily.     nitroGLYCERIN (NITROSTAT) 0.4 MG SL tablet Place 0.4 mg under the tongue every 5 (five) minutes as needed for chest pain.     OVER THE COUNTER MEDICATION Takes OTC eczema cream PRN.     pantoprazole (PROTONIX) 40 MG tablet TAKE 1 TABLET BY MOUTH TWICE A DAY 120 tablet 0   rosuvastatin (CRESTOR) 5 MG tablet TAKE 1 TAB IN THE EVENING 3 DAYS A WEEK (MWF) FOR CHOLESTEROL TO REDUCE HEART ATTACK/STROKE RISK. 36 tablet 2   triamterene-hydrochlorothiazide (MAXZIDE-25) 37.5-25 MG tablet TAKE 1 TABLET DAILY FOR BP & FLUID 90 tablet 3   Wheat Dextrin (BENEFIBER PO) Take 1 Dose by mouth daily.     zinc gluconate 50 MG tablet Take 50 mg by mouth daily.     No current facility-administered medications for this visit.    Allergies as of 09/19/2021 - Review Complete 09/19/2021  Allergen Reaction Noted   Ace inhibitors Cough 08/12/2018   Lemon oil Hives 11/30/2013   Natural vegetable orange [psyllium]  06/16/2020   Shellfish allergy Swelling and Hives  08/18/2013    Family History  Problem Relation Age of Onset   Hypertension Mother    Stroke Mother    Heart attack Father    Breast cancer Sister    Cancer Brother    Prostate cancer Brother    Other Brother        Heart Issues   Cancer Brother    Breast cancer Maternal Grandmother    Lung disease Neg Hx    Rheumatologic disease Neg Hx    Colon cancer Neg Hx    Esophageal cancer Neg Hx    Pancreatic cancer Neg Hx    Liver cancer Neg Hx         Physical Exam: General:   Alert,  well-nourished, pleasant and cooperative in NAD Head:  Normocephalic and atraumatic. Eyes:  Sclera clear, no icterus.   Conjunctiva pink. Abdomen:  Soft, nontender, nondistended, normal bowel sounds, no rebound or guarding. No hepatosplenomegaly.   Rectal:  Deferred  Msk:  Symmetrical.  No boney deformities LAD: No inguinal or umbilical LAD Extremities:  No clubbing or edema. Neurologic:  Alert and  oriented x4;  grossly nonfocal Skin:  Intact without significant lesions or rashes. Psych:  Alert and cooperative. Normal mood and affect.      Carlson Belland L. Tarri Glenn, MD, MPH 09/20/2021, 2:47 PM

## 2021-09-19 NOTE — Patient Instructions (Signed)
If you are age 81 or older, your body mass index should be between 23-30. Your Body mass index is 24.07 kg/m. If this is out of the aforementioned range listed, please consider follow up with your Primary Care Provider. ________________________________________________________  The [facility] providers would like to encourage you to use [facility] to communicate with providers for non-urgent requests or questions.  Due to long hold times on the telephone, sending your provider a message by [facility] may be a faster and more efficient way to get a response.  Please allow 48 business hours for a response.  Please remember that this is for non-urgent requests.  _______________________________________________________  Your provider has requested that you go to the basement level for lab work before leaving today. Press "B" on the elevator. The lab is located at the first door on the left as you exit the elevator.  You have been scheduled for a CT scan of the abdomen and pelvis at [facility], 1st floor Radiology. You are scheduled on 10-02-21  at 10:30am. You should arrive 15 minutes prior to your appointment time for registration.  Please pick up 2 bottles of contrast from [place] at least 3 days prior to your scan. The solution may taste better if refrigerated, but do NOT add ice or any other liquid to this solution. Shake well before drinking.   Please follow the written instructions below on the day of your exam:   1) Do not eat anything after 6:30am (4 hours prior to your test)   2) Drink 1 bottle of contrast @ 8:30am (2 hours prior to your exam)  Remember to shake well before drinking and do NOT pour over ice.     Drink 1 bottle of contrast @ 9:30am (1 hour prior to your exam)   You may take any medications as prescribed with a small amount of water, if necessary. If you take any of the following medications: METFORMIN, GLUCOPHAGE, GLUCOVANCE, AVANDAMET, RIOMET, FORTAMET, Scalp Level MET,  JANUMET, GLUMETZA or METAGLIP, you MAY be asked to HOLD this medication 48 hours AFTER the exam.   The purpose of you drinking the oral contrast is to aid in the visualization of your intestinal tract. The contrast solution may cause some diarrhea. Depending on your individual set of symptoms, you may also receive an intravenous injection of x-ray contrast/dye. Plan on being at [facility] for 45 minutes or longer, depending on the type of exam you are having performed.   If you have any questions regarding your exam or if you need to reschedule, you may call [facility] at [phone] between the hours of 8:00 am and 5:00 pm, Monday-Friday.   Thank you for entrusting me with your care and choosing [facility].  Dr Tarri Glenn

## 2021-09-20 ENCOUNTER — Other Ambulatory Visit: Payer: Self-pay

## 2021-09-20 ENCOUNTER — Ambulatory Visit (INDEPENDENT_AMBULATORY_CARE_PROVIDER_SITE_OTHER): Payer: Medicare PPO | Admitting: Internal Medicine

## 2021-09-20 ENCOUNTER — Encounter: Payer: Self-pay | Admitting: Gastroenterology

## 2021-09-20 ENCOUNTER — Encounter: Payer: Self-pay | Admitting: Internal Medicine

## 2021-09-20 ENCOUNTER — Ambulatory Visit: Payer: Medicare PPO | Admitting: Internal Medicine

## 2021-09-20 VITALS — BP 128/80 | HR 70 | Temp 98.0°F | Ht 66.0 in | Wt 150.2 lb

## 2021-09-20 DIAGNOSIS — J849 Interstitial pulmonary disease, unspecified: Secondary | ICD-10-CM

## 2021-09-20 DIAGNOSIS — J439 Emphysema, unspecified: Secondary | ICD-10-CM | POA: Diagnosis not present

## 2021-09-20 LAB — PULMONARY FUNCTION TEST
DL/VA % pred: 59 %
DL/VA: 2.39 ml/min/mmHg/L
DLCO cor % pred: 49 %
DLCO cor: 10.01 ml/min/mmHg
DLCO unc % pred: 49 %
DLCO unc: 10.04 ml/min/mmHg
FEF 25-75 Pre: 0.79 L/sec
FEF2575-%Pred-Pre: 54 %
FEV1-%Pred-Pre: 94 %
FEV1-Pre: 1.65 L
FEV1FVC-%Pred-Pre: 82 %
FEV6-%Pred-Pre: 119 %
FEV6-Pre: 2.59 L
FEV6FVC-%Pred-Pre: 101 %
FVC-%Pred-Pre: 117 %
FVC-Pre: 2.65 L
Pre FEV1/FVC ratio: 62 %
Pre FEV6/FVC Ratio: 98 %

## 2021-09-20 NOTE — Progress Notes (Signed)
'@Patient'  ID: Catherine Parks, female    DOB: Jun 29, 1941, 81 y.o.   MRN: [id number]  Chief Complaint  Patient presents with   Follow-up    pft    Referring provider: Unk Pinto, MD  HPI  81 year old female former smoker followed for mild COPD with emphysema , lung nodule and ILD    TESTS: High resolution CT chest November 2017 showed ILD changes with  basilar predominant patchy subpleural reticulation, traction bronchiectasis and mild architectural distortion with mild honeycombing at the right base.  CT chest May 2018 showed no change in numerous tiny pulmonary nodules scattered throughout the lung 6 mm dating back to October 2017, stable ILD changes CT chest November 2018 showed stable pulmonary nodules felt to be benign with no specific follow-up noted.  Chronic changes of interstitial fibrosis without change.   PFT 06/13/17: FVC 2.89 L (121%) FEV1 1.91 L (103%) FEV1/FVC 0.66 FEF 25-75 1.00 L (63%)                                                                                                                        DLCO corrected 46% 12/27/16: FVC 2.81 L (117%) FEV1 1.85 L (99%) FEV1/FVC 0.66 FEF 25-75 0.93 L (58%)                                                                                                                         DLCO uncorrected 40% 09/27/16: FVC 2.92 L (121%) FEV1 2.01 L (180%) FEV1/FVC 0.69 FEF 25-75 1.20 L (74%) negative bronchodilator response TLC 5.05 L (94%) RV 85% ERV 501% DLCO corrected 50% (Hgb 12.7)  PFT Results Latest Ref Rng & Units 02/26/2019 06/13/2017 12/27/2016 09/27/2016  FVC-Pre L 2.75 2.89 2.81 2.92  FVC-Predicted Pre % 118 121 117 121  FVC-Post L 2.75 - - 2.95  FVC-Predicted Post % 118 - - 122  Pre FEV1/FVC % % 64 66 66 69  Post FEV1/FCV % % 65 - - 70  FEV1-Pre L 1.77 1.91 1.85 2.01  FEV1-Predicted Pre % 98 103 99 108  FEV1-Post L 1.79 - - 2.07  DLCO UNC% % 57 47 40 49  DLCO COR %Predicted % 68 51 48 60  TLC L 5.10 - - 5.05   TLC % Predicted % 95 - - 94  RV % Predicted % 99 - - 85      LABS 08/02/16 Alpha-1 antitrypsin: MM (153) IgG: 1276 IgA: 658 IgM: 76  IgE: 1652 RAST Panel:  D farinae 1.75 / Cockroach 1.66 & other weak positives CRP: 0.1 ESR: 59 ANA:  Positive DS DNA Ab:  181 Smith Ab:  <0.2 RNP Ab:  <0.2 SSA:  <0.2 SSB:  <0.2 Anti-CCP:  <16  OV 02/26/19 - follow up mild COPD, ILD, pulmonary nodules Patient presents for follow-up visit today.  She was last seen by Rexene Edison on 12/24/2017.  She states that this is been a stable interval for her.  She has not seen any increase in her shortness of breath.  She states that she has been more inactive than usual due to current COVID pandemic.  She was going to the [facility] to walk 3 times per week but the [employer] has been closed for the last few months.  Patient tries to remain active inside her house and does lift some light weight dumbbells.  She does continue to get winded with heavy activity.  She is not on any inhalers.  Patient has known ILD changes on CT scan dating back to October 2017.  Previous CT chest showed stable changes.  Patient did have a PFT completed in office today and it is stable from last PFT.  Patient has had previous autoimmune work-up that was suggestive of lupus and was referred to rheumatology.  She is not on no suppression.   Patient has a history of pulmonary nodules on CT scan.  These nodules had shown no change and were consistent with a benign etiology.  Patient's last CT chest was in 2018.  OV 01/19/2020  Subjective:  Patient ID: Catherine Parks, female , DOB: 1941/08/29 , age 46 y.o. , MRN: [id number] , ADDRESS: [place] [address]   01/19/2020 -   Chief Complaint  Patient presents with   Follow-up    no concerns   Follow-up combination emphysema and ILD with pulmonary nodule. ANA positive and double-stranded DNA positive in 2017  HPI Catherine Parks 81 y.o. -returns for follow-up.  I  personally not seen her in few to several years.  She is seen the nurse practitioner in between.  Nurse practitioner notes her above.  Overall she tells me she is doing stable.  She is very minimal symptoms.  Symptom score is documented below.  For positive ANA and double-stranded DNA last year she is did see a rheumatologist.  She does not know who she saw but said she was reassured that she did not have any systemic evidence of connective tissue disease.  She is not taking any inhalers.  She feels that her shortness of breath on exertion is much improved.  She only notices it for stairs but even then it is minimal.  Her most recent test results were reviewed.  She is interested in serial monitoring and ensuring disease is stable.  She is concerned about interstitial lung disease.  She had a cardiac stress test in 2019 that was normal.  Her CT scan of the chest also shows enlarged pulmonary arteries.  She has grade 1 diastolic dysfunction on an echo.    OV 05/01/2020   Subjective:  Patient ID: Catherine Parks, female , DOB: Sep 28, 1940, age 49 y.o. years. , MRN: [id number],  ADDRESS: [place] [address] PCP  Unk Pinto, MD Providers : Treatment Team:  Attending Provider: Brand Males, MD   Chief Complaint  Patient presents with   Follow-up    Pt states she has been doing okay since last visit. States  that she still becomes SOB with exertion.    Follow-up combination emphysema and ILD with pulmonary nodule. ANA positive and double-stranded DNA positive 181 in 2017 Positive IGE - 1652 , and positive enviromental allergy test RAST - Nov 2017 Piror > 50 pack smoker  HPI Catherine Parks 81 y.o. -presents for follow-up for the above medical issues.  This is to discuss the results of investigation.  At this point in time she tells me that her dyspnea is somewhat worse.  No chest pain or cough.  In the past inhalers have not worked.  Her pulmonary function test  and CT scan of the chest shows stability in both ILD and emphysema and solitary pulmonary nodule.  She feels overall that her ILD has been there for a long time and it is been stable.  However she feels dyspnea is worse.  The interim finding is that she has new depressed ejection fraction of the left ventricle.  She has an appointment Dr. Dorris Carnes in June 16, 2020.  There is no associated chest pain.  There is no orthopnea there is no proximal nocturnal dyspnea.  She is reluctant about antifibrotic's because she has GI issues although she has marked 0 for vomiting and diarrhea.  Review of the labs indicate that in 2017 she was also significantly positive IgE and other allergens.  She tells me there is no mold in the house.  She has no pet birds.  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     SYMPTOM SCALE - ILD 01/19/2020  05/01/2020   O2 use ra ra  Shortness of Breath 0 -> 5 scale with 5 being worst (score 6 If unable to do)   At rest 0 0  Simple tasks - showers, clothes change, eating, shaving 0 1  Household (dishes, doing bed, laundry) 1 3  Shopping 1 3  Walking level at own pace 1 2  Walking up Stairs 2 3  Total (30-36) Dyspnea Score 5 12  How bad is your cough? 0 1  How bad is your fatigue 1 0  How bad is nausea 0 0  How bad is vomiting?  0 0  How bad is diarrhea? 0 0  How bad is anxiety? 0 2  How bad is depression 0 1   Simple office walk 185 feet x  3 laps goal with forehead probe 01/19/2020   O2 used ra  Number laps completed 3  Comments about pace x  Resting Pulse Ox/HR 100% and x/min  Final Pulse Ox/HR 98% and 98/min  Desaturated </= 88% no  Desaturated <= 3% points no  Got Tachycardic >/= 90/min yes  Symptoms at end of test x  Miscellaneous comments x   Results for LORANN, TANI (MRN [id number]) as of 05/01/2020 09:03  Ref. Range 09/27/2016 10:23 12/27/2016 08:50 06/13/2017 13:24 02/26/2019 08:48 04/20/2020 10:47   FVC-Pre Latest Units: L 2.92 2.81 2.89 2.75 2.78  FVC-%Pred-Pre Latest Units: % 121 117 121 118 121   Results for ROSALENE, WARDROP (MRN [id number]) as of 05/01/2020 09:03  Ref. Range 09/27/2016 10:23 12/27/2016 08:50 06/13/2017 13:24 02/26/2019 08:48 04/20/2020 10:47  DLCO unc Latest Units: ml/min/mmHg 13.33 10.95 12.85 11.55 10.81  DLCO unc % pred Latest Units: % 49 40 47 57 53      HRCT 04/24/20  CLINICAL DATA:  Interstitial lung disease. Increased shortness of breath with exertion the past few years.  EXAM: CT CHEST WITHOUT CONTRAST   TECHNIQUE: Multidetector CT imaging of the chest was performed following the standard protocol without intravenous contrast. High resolution imaging of the lungs, as well as inspiratory and expiratory imaging, was performed.   COMPARISON:  03/17/2019 07/17/2017.   FINDINGS: Cardiovascular: Atherosclerotic calcification of the aorta, aortic valve and coronary arteries. Pulmonic trunk is enlarged. Heart size normal. No pericardial effusion.   Mediastinum/Nodes: 1.2 cm low-attenuation left thyroid nodule. No follow-up recommended (ref: J Am Coll Radiol. 2015 Feb;12(2): 143-50).No pathologically enlarged mediastinal or axillary lymph nodes. Hilar regions are difficult to definitively evaluate without IV contrast but appear grossly unremarkable. Esophagus is slightly dilated.   Lungs/Pleura: Centrilobular and paraseptal emphysema. There is traction bronchiectasis, ground-glass and mild architectural distortion predominating in the lower lobes, as on prior exams. Minimal subpleural reticulation is seen in association. No honeycombing. 5 mm anterior left lower lobe nodule (3/120) is unchanged from 07/17/2017 and considered benign. No pleural fluid. Airway is unremarkable. Minimal air trapping.   Upper Abdomen: Visualized portions of the liver, adrenal glands, kidneys, spleen, pancreas, stomach and bowel are grossly unremarkable. No upper abdominal  adenopathy.   Musculoskeletal: No worrisome lytic or sclerotic lesions. T12 and L1 compression fractures are new. No worrisome lytic or sclerotic lesions.   IMPRESSION: 1. Pulmonary parenchymal pattern of fibrosis may be due to fibrotic nonspecific interstitial pneumonitis. Chronic hypersensitivity pneumonitis is not excluded. Findings are indeterminate for UIP per consensus guidelines: Diagnosis of Idiopathic Pulmonary Fibrosis: An Official ATS/ERS/JRS/ALAT Clinical Practice Guideline. [place], Iss 5, [phone], May 17 2017. 2. Aortic atherosclerosis (ICD10-I70.0). Coronary artery calcification. 3. Enlarged pulmonic trunk, indicative of pulmonary arterial hypertension. 4.  Emphysema (ICD10-J43.9).     Electronically Signed   By: Lorin Picket M.D.   On: 04/24/2020 12:47    ECHO 04/21/20   Sonographer:    Jannett Celestine RDCS (AE)  Referring Phys: [id number] Esaul Dorwart   IMPRESSIONS     1. Left ventricular ejection fraction, by estimation, is 35 to 40%. The  left ventricle has moderately decreased function. The left ventricle  demonstrates global hypokinesis. Left ventricular diastolic parameters are  consistent with Grade I diastolic  dysfunction (impaired relaxation). The average left ventricular global  longitudinal strain is -15.8 %.   2. Right ventricular systolic function is mildly reduced. The right  ventricular size is normal.   3. The mitral valve is grossly normal. Trivial mitral valve  regurgitation.   4. The aortic valve is grossly normal. Aortic valve regurgitation is not  visualized. No aortic stenosis is present.     ROS - per HPI   OV 10/31/2020  Subjective:  Patient ID: Catherine Parks, female , DOB: 09-07-1941 , age 6 y.o. , MRN: [id number] , ADDRESS: [place] [address] PCP Unk Pinto, MD Patient Care Team: Unk Pinto, MD as PCP - General (Internal Medicine) Warden Fillers, MD  as Consulting Physician (Optometry) Croitoru, Dani Gobble, MD as Consulting Physician (Cardiology) Sable Feil, MD as Consulting Physician (Gastroenterology) Mickle Plumb, MD (Gynecology) Druscilla Brownie, MD as Consulting Physician (Dermatology) Volanda Napoleon, MD as Consulting Physician (Oncology) Rozetta Nunnery, MD as Consulting Physician (Otolaryngology)  This Provider for this visit: Treatment Team:  Attending Provider: Brand Males, MD    10/31/2020 -   Chief Complaint  Patient presents with   Follow-up    Doing well    Follow-up combination emphysema and ILD with pulmonary nodule.  - indeterminate UIP;  Last CT Aug 2021  - HP panel neg aug 2021  -ANA positive and double-stranded DNA +181 in November 2017 ANA positive and double-stranded DNA positive 181 in 2017 Positive IGE   - 1652 , and positive enviromental allergy test RAST - Nov 2017  - elevated 1411 - in aug 2021  - 2021 - sees Dr Fredderick Phenix  Piror > 50 pack smoker   HPI Catherine Parks 81 y.o. -presents for follow-up.  Last seen in summer 2021.  At the time her ILD was stable.  Etiology of the indeterminate pattern of ILD was not known.  I ordered a hypersensitive pneumonitis panel was negative.  Her IgE was elevated.  She has seen Dr. Fredderick Phenix in allergy according to history.  I do not know the details of this but apparently she is on as needed tablets.  At this point in time her dyspnea is improved.  Her walking desaturation test is stable.  Her pulmonary function test showed decline in FVC with stability/improvement in DLCO.  Overall she is stable.  She has not attended pulmonary rehabilitation a few years.  She found it beneficial in the past but she does not want to go again because she is feeling better and she plans to work out with her friends.  There was concern of low ejection fraction and I sent her to cardiology Dr. Harrington Challenger.  Apparently repeat images were done and I confirmed this on the  note.  The EF is actually normal.  Overall patient is satisfied with the quality of life.  She is not interested in lung biopsy preemptive antifibrotic's.  She is interested in serial monitoring and supportive care at this point.  She will address antifibrotic's if she declines based on a risk-benefit analysis at that time.  For her associated emphysema last time I gave her Spiriva.  She tells me that it did not benefit her.  She is not interested in that or albuterol as needed.  No systemic manifestations of connective tissue disease so far    OV 09/20/2021  Subjective:  Patient ID: Catherine Parks, female , DOB: Aug 28, 1941 , age 67 y.o. , MRN: [id number] , ADDRESS: [place] [id number] PCP Unk Pinto, MD Patient Care Team: Unk Pinto, MD as PCP - General (Internal Medicine) Warden Fillers, MD as Consulting Physician (Optometry) Croitoru, Dani Gobble, MD as Consulting Physician (Cardiology) Sable Feil, MD as Consulting Physician (Gastroenterology) Mickle Plumb, MD (Gynecology) Druscilla Brownie, MD as Consulting Physician (Dermatology) Volanda Napoleon, MD as Consulting Physician (Oncology) Rozetta Nunnery, MD (Inactive) as Consulting Physician (Otolaryngology)  This Provider for this visit: Treatment Team:  Attending Provider: Brand Males, MD    Follow-up combination emphysema and ILD with pulmonary nodule.  - indeterminate UIP; Last CT Aug 2021  - HP panel neg aug 2021  -ANA positive and double-stranded DNA +181 in November 2017  ANA positive and double-stranded DNA positive 181 in 2017 Positive IGE   - 1652 , and positive enviromental allergy test RAST - Nov 2017  - elevated 1411 - in aug 2021  - 2021 - sees Dr Fredderick Phenix  Piror > 50 pack smoker  Chronic systolic dysfunction sees Dr. Nevin Bloodgood Ross-last visit October 2021  Hypercalcemia  Chronic abdominal pain sees Dr. Thornton Park  -Colonoscopy October 2022 with  pancolonic diverticulosis and EGD is mild sized hiatal hernia, gastritis and duodenitis biopsies negative  Hypertension  Nonspecific arthritis  09/20/2021 -   Chief Complaint  Patient presents with  Follow-up    PFT performed today.  Pt states she is about the same since last visit.     HPI [name] Fake 81 y.o. -returns for follow-up.  Not seen her in a year.  Most recent CT scan November 2022 shows probable UIP.  Symptoms and pulmonary function tests are stable.  There are no new issues other than the fact she updated me about her GI issues.  At this point in time we discussed about continued supportive care.  She is in favor of this.  We discussed antifibrotic's again.  I went over the antifibrotic's and the side effect profile and the benefit profile.  She is again hesitant towards this.  Discussed the third option about clinical trials with an infusion study against connective tissue growth factor and the potential side effect profile of that.  She is interested in this.  We gave her the consent form.  But later the research coordinator indicated that because her FVC is over 95% she does not qualify for this trial.  Research coronary will inform the patient about this.  We will discuss in a case conference if this is IPF       SYMPTOM SCALE - ILD 01/19/2020  05/01/2020  10/31/2020  09/20/2021   O2 use ra ra ra ra  Shortness of Breath 0 -> 5 scale with 5 being worst (score 6 If unable to do)     At rest 0 0 1 0  Simple tasks - showers, clothes change, eating, shaving 0 1 1 0  Household (dishes, doing bed, laundry) '1 3 2 1  ' Shopping '1 3 1 1  ' Walking level at own pace '1 2 2 1  ' Walking up Stairs '2 3 2 2  ' Total (30-36) Dyspnea Score '5 12 9 5  ' How bad is your cough? 0 1 0 0  How bad is your fatigue 1 0 1 0  How bad is nausea 0 0 0 0  How bad is vomiting?  0 0 0 0  How bad is diarrhea? 0 0 0 0  How bad is anxiety? 0 2 0 0  How bad is depression 0 1 0 0   Simple office walk 185  feet x  3 laps goal with forehead probe 01/19/2020  10/31/2020  09/20/2021   O2 used ra ra ra  Number laps completed '3 3 3  ' Comments about pace x avg pacd avg  Resting Pulse Ox/HR 100% and x/min 100% and70 100% and 70  Final Pulse Ox/HR 98% and 98/min 99% and 88/mn 99% and 119  Desaturated </= 88% no no no  Desaturated <= 3% points no no no  Got Tachycardic >/= 90/min yes no yes  Symptoms at end of test x No dyspnea No complaints  Miscellaneous comments x  ? More tacht      CT Chest data  No results found.    PFT  PFT Results Latest Ref Rng & Units 09/20/2021 08/31/2020 04/20/2020 02/26/2019 06/13/2017 12/27/2016 09/27/2016  FVC-Pre L 2.65 2.64 2.78 2.75 2.89 2.81 2.92  FVC-Predicted Pre % 117 115 121 118 121 117 121  FVC-Post L - - - 2.75 - - 2.95  FVC-Predicted Post % - - - 118 - - 122  Pre FEV1/FVC % % 62 65 64 64 66 66 69  Post FEV1/FCV % % - - - 65 - - 70  FEV1-Pre L 1.65 1.72 1.78 1.77 1.91 1.85 2.01  FEV1-Predicted Pre % 94 97 100 98 103  99 108  FEV1-Post L - - - 1.79 - - 2.07  DLCO uncorrected ml/min/mmHg 10.04 13.53 10.81 11.55 12.85 10.95 13.33  DLCO UNC% % 49 67 53 57 47 40 49  DLCO corrected ml/min/mmHg 10.01 13.53 10.92 11.66 12.56 - 13.63  DLCO COR %Predicted % 49 67 54 57 46 - 50  DLVA Predicted % 59 84 59 68 51 48 60  TLC L - - - 5.10 - - 5.05  TLC % Predicted % - - - 95 - - 94  RV % Predicted % - - - 99 - - 85    CT chest high resolutio 07/24/21   Narrative & Impression  CLINICAL DATA:  81 year old female with history of emphysema. Evaluate for interstitial lung disease.   EXAM: CT CHEST WITHOUT CONTRAST   TECHNIQUE: Multidetector CT imaging of the chest was performed following the standard protocol without intravenous contrast. High resolution imaging of the lungs, as well as inspiratory and expiratory imaging, was performed.   COMPARISON:  Chest CT 04/24/2020.   FINDINGS: Cardiovascular: Heart size is normal. There is no significant pericardial  fluid, thickening or pericardial calcification. There is aortic atherosclerosis, as well as atherosclerosis of the great vessels of the mediastinum and the coronary arteries, including calcified atherosclerotic plaque in the left main coronary artery. Dilatation of the pulmonic trunk (3.7 cm in diameter).   Mediastinum/Nodes: No pathologically enlarged mediastinal or hilar lymph nodes. Please note that accurate exclusion of hilar adenopathy is limited on noncontrast CT scans. Esophagus is unremarkable in appearance. No axillary lymphadenopathy.   Lungs/Pleura: Diffuse bronchial wall thickening with moderate centrilobular and paraseptal emphysema. High-resolution images also demonstrate patchy areas of ground-glass attenuation, septal thickening, cylindrical and varicose bronchiectasis in the lung bases bilaterally. There is is suggestion of developing honeycombing, although this is not yet definitive, as this may simply represent areas of relatively confluent bronchiectasis. Inspiratory and expiratory imaging is unremarkable. No suspicious appearing pulmonary nodules or masses are noted. No acute consolidative airspace disease. No pleural effusions.   Upper Abdomen: Aortic atherosclerosis.   Musculoskeletal: Chronic compression fracture of superior endplate of A25 with 05% loss of anterior vertebral body height, similar to the prior study. There are no aggressive appearing lytic or blastic lesions noted in the visualized portions of the skeleton.   IMPRESSION: 1. The appearance of the lungs is once again suggestive of interstitial lung disease, with relatively stable fibrotic changes in the lung bases. Given the presence of the basilar predominant pattern and bronchiectasis, at this is categorized as probable usual interstitial pneumonia (UIP) per current ATS guidelines. 2. Diffuse bronchial wall thickening with moderate centrilobular and paraseptal emphysema; imaging findings  suggestive of underlying COPD. 3. Dilatation of the pulmonic trunk (3.7 cm in diameter), suggestive of pulmonary arterial hypertension. 4. Aortic atherosclerosis, in addition to left main coronary artery disease.   Aortic Atherosclerosis (ICD10-I70.0) and Emphysema (ICD10-J43.9).     Electronically Signed   By: Vinnie Langton M.D.   On: 07/25/2021 09:00       has a past medical history of Allergic rhinitis, Arthritis, Bronchiectasis ([place]), Colon polyps, Diverticulosis, Emphysema of lung ([place]), GERD (gastroesophageal reflux disease), HSV-1 (herpes simplex virus 1) infection, Hypertension, and Pulmonary nodule (07/10/2017).   reports that she quit smoking about 6 years ago. Her smoking use included cigarettes. She started smoking about 64 years ago. She has a 58.00 pack-year smoking history. She has never used smokeless tobacco.  Past Surgical History:  Procedure Laterality Date   ABDOMINAL HYSTERECTOMY  ovaries spared   CESAREAN SECTION  1969   COLONOSCOPY     DIRECT LARYNGOSCOPY N/A 10/21/2014   Procedure: DIRECT LARYNGOSCOPY;  Surgeon: Rozetta Nunnery, MD;  Location: [facility];  Service: ENT;  Laterality: N/A;   EXCISION NASAL MASS Right 10/21/2014   Procedure: EXCISION NASOPHARYNGEAL MASS;  Surgeon: Rozetta Nunnery, MD;  Location: [facility];  Service: ENT;  Laterality: Right;   EYE SURGERY     cataracts   MASS EXCISION Right 10/21/2014   Procedure: RIGHT NECK NODE BIOPSY;  Surgeon: Rozetta Nunnery, MD;  Location: [facility];  Service: ENT;  Laterality: Right;    Allergies  Allergen Reactions   Ace Inhibitors Cough   Lemon Oil Hives    Reaction to lemons   Natural Vegetable Orange [Psyllium]     oranges   Shellfish Allergy Swelling and Hives    Immunization History  Administered Date(s) Administered   DTaP 07/30/2011   Fluad Quad(high Dose 65+) 08/25/2020   Influenza, High Dose Seasonal PF 06/07/2016,  06/25/2017, 09/22/2018, 07/19/2019, 07/25/2021   PFIZER(Purple Top)SARS-COV-2 Vaccination 10/05/2019, 10/25/2019, 08/25/2020   Pfizer Covid-19 Vaccine Bivalent Booster 39yr & up 07/21/2021   Pneumococcal Conjugate-13 11/22/2015   Pneumococcal Polysaccharide-23 05/11/2013   Pneumococcal-Unspecified 01/07/2002   Td 01/07/2002, 05/07/2012    Family History  Problem Relation Age of Onset   Hypertension Mother    Stroke Mother    Heart attack Father    Breast cancer Sister    Cancer Brother    Prostate cancer Brother    Other Brother        Heart Issues   Cancer Brother    Breast cancer Maternal Grandmother    Lung disease Neg Hx    Rheumatologic disease Neg Hx    Colon cancer Neg Hx    Esophageal cancer Neg Hx    Pancreatic cancer Neg Hx    Liver cancer Neg Hx      Current Outpatient Medications:    acetaminophen (TYLENOL) 500 MG tablet, Take 500 mg by mouth every 6 (six) hours as needed (pain)., Disp: , Rfl:    amLODipine (NORVASC) 10 MG tablet, TAKE 1 TABLET BY MOUTH EVERY DAY FOR BLOOD PRESSURE, Disp: 90 tablet, Rfl: 3   Ascorbic Acid (VITAMIN C) 100 MG tablet, Take 100 mg by mouth daily., Disp: , Rfl:    azelastine (ASTELIN) 0.1 % nasal spray, , Disp: , Rfl:    cholecalciferol (VITAMIN D) 1000 units tablet, Take 6,000 Units by mouth daily after lunch. , Disp: , Rfl:    cloNIDine (CATAPRES) 0.2 MG tablet, 0.2 mg as needed., Disp: , Rfl:    famotidine (PEPCID) 40 MG tablet, Take  1 tablet  Daily  to Prevent Heartburn & Indigestion, Disp: 90 tablet, Rfl: 3   Flaxseed, Linseed, (FLAX SEED OIL) 1000 MG CAPS, Take 1,000 mg by mouth daily after lunch., Disp: , Rfl:    halobetasol (ULTRAVATE) 0.05 % cream, Apply topically 2 (two) times daily. Apply to Eczema Rash 2 x/day, Disp: 100 g, Rfl: 11   hydrocortisone 2.5 % cream, , Disp: , Rfl:    Magnesium 250 MG TABS, Take 1 tablet (250 mg total) by mouth daily., Disp: 30 tablet, Rfl: 0   Naphazoline HCl (CLEAR EYES OP), Place 1 drop  into both eyes daily., Disp: , Rfl:    nitroGLYCERIN (NITROSTAT) 0.4 MG SL tablet, Place 0.4 mg under the tongue every 5 (five) minutes as needed for chest pain.,  Disp: , Rfl:    OVER THE COUNTER MEDICATION, Takes OTC eczema cream PRN., Disp: , Rfl:    pantoprazole (PROTONIX) 40 MG tablet, TAKE 1 TABLET BY MOUTH TWICE A DAY, Disp: 120 tablet, Rfl: 0   rosuvastatin (CRESTOR) 5 MG tablet, TAKE 1 TAB IN THE EVENING 3 DAYS A WEEK (MWF) FOR CHOLESTEROL TO REDUCE HEART ATTACK/STROKE RISK., Disp: 36 tablet, Rfl: 2   triamterene-hydrochlorothiazide (MAXZIDE-25) 37.5-25 MG tablet, TAKE 1 TABLET DAILY FOR BP & FLUID, Disp: 90 tablet, Rfl: 3   Wheat Dextrin (BENEFIBER PO), Take 1 Dose by mouth daily., Disp: , Rfl:    zinc gluconate 50 MG tablet, Take 50 mg by mouth daily., Disp: , Rfl:       Objective:   Vitals:   09/20/21 1020  BP: 128/80  Pulse: 70  Temp: 98 F (36.7 C)  TempSrc: Oral  SpO2: 100%  Weight: 150 lb 3.2 oz (68.1 kg)  Height: '5\' 6"'  (1.676 m)    Estimated body mass index is 24.24 kg/m as calculated from the following:   Height as of this encounter: '5\' 6"'  (1.676 m).   Weight as of this encounter: 150 lb 3.2 oz (68.1 kg).  '@WEIGHTCHANGE' @  [employer]   09/20/21 1020  Weight: 150 lb 3.2 oz (68.1 kg)     Physical Exam  General: No distress. Looks wll Neuro: Alert and Oriented x 3. GCS 15. Speech normal Psych: Pleasant Resp:  Barrel Chest - no.  Wheeze - no, Crackles - yes, No overt respiratory distress CVS: Normal heart sounds. Murmurs - no Ext: Stigmata of Connective Tissue Disease - no HEENT: Normal upper airway. PEERL +. No post nasal drip        Assessment:       ICD-10-CM   1. ILD (interstitial lung disease) ([place])  J84.9     2. Pulmonary emphysema, unspecified emphysema type ([place])  J43.9          Plan:     Patient Instructions   ILD (interstitial lung disease) ([place])  - clinically stable since 2021 but worse since 2018 - features likely  consistent with a variety called IPF    - no evidence of associated problems like lupus - discussed various option  Plan - based on shared decision making - will discuss at our case conference - will hold off on anti-fibrotics - take consent form for a study called Zephyrus and review with your family - [facility] coordinator Alleen Borne might reach out to you in several days - weeks  -    Pulmonary emphysema, unspecified emphysema type ([place])  -Too bad Spiriva and albuterol have not helped you  Plan -Expectant follow-up   Solitary Pulmonary Nodule - stable 2018 -> 2021 august. Not reported Jan 2023  Plan  - no further followup   Elevated IgE level in 2017  Plan  -Per Dr. Fredderick Phenix allergist  Followup  -6 months on a 30-minute slot - symptoms socre and simple walkng desaturation test at followup  ( Level 05 visit: Estb 40-54 min in  visit type: on-site physical face to visit  in total care time and counseling or/and coordination of care by this undersigned MD - Dr Brand Males. This includes one or more of the following on this same day 09/20/2021: pre-charting, chart review, note writing, documentation discussion of test results, diagnostic or treatment recommendations, prognosis, risks and benefits of management options, instructions, education, compliance or risk-factor reduction. It excludes time spent by the [place] or office staff  in the care of the patient. Actual time 40 min)   SIGNATURE    Dr. Brand Males, M.D., F.C.C.P,  Pulmonary and Critical Care Medicine Staff Physician, [place] Director - Interstitial Lung Disease  Program  Pulmonary Ward at [place], [place], [address]  Pager: [phone], If no answer or between  15:00h - 7:00h: call [phone] Telephone: [phone]  5:43 PM 09/20/2021

## 2021-09-20 NOTE — Patient Instructions (Addendum)
°  ILD (interstitial lung disease) ([place])  - clinically stable since 2021 but worse since 2018 - features likely consistent with a variety called IPF    - no evidence of associated problems like lupus - discussed various option  Plan - based on shared decision making - will discuss at our case conference - will hold off on anti-fibrotics - take consent form for a study called Zephyrus and review with your family - [facility] coordinator Alleen Borne might reach out to you in several days - weeks  -    Pulmonary emphysema, unspecified emphysema type ([place])  -Too bad Spiriva and albuterol have not helped you  Plan -Expectant follow-up   Solitary Pulmonary Nodule - stable 2018 -> 2021 august. Not reported Jan 2023  Plan  - no further followup   Elevated IgE level in 2017  Plan  -Per Dr. Fredderick Phenix allergist  Followup  -6 months on a 30-minute slot - symptoms socre and simple walkng desaturation test at followup

## 2021-09-20 NOTE — Progress Notes (Signed)
Spirometry and Dlco done today. 

## 2021-09-27 IMAGING — CT CT CHEST HIGH RESOLUTION W/O CM
2 of 8 series · 14 of 36 positions shown, 17 images · non-contrast
Comparison: 03/17/2019 07/17/2017.

CLINICAL DATA: Interstitial lung disease. Increased shortness of
breath with exertion the past few years.

EXAM:
CT CHEST WITHOUT CONTRAST
TECHNIQUE: Multidetector CT imaging of the chest was performed following the
standard protocol without intravenous contrast. High resolution
imaging of the lungs, as well as inspiratory and expiratory imaging,
was performed.

[Series 5: high resolution · axial · 0.60mm/px · z∈[-298,-34]mm · 11 of 319 slices shown, 14 images]
[im 27/319  mediastinal]
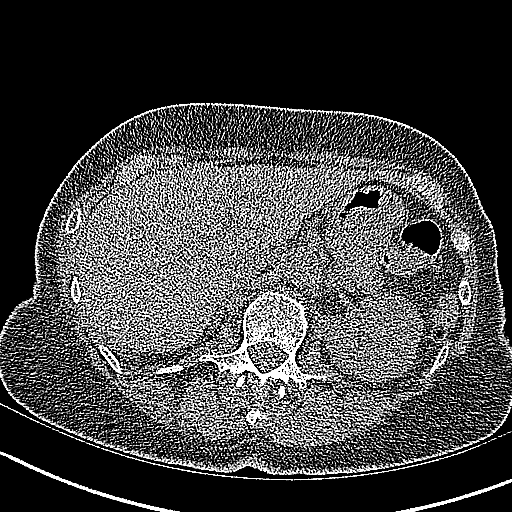
[im 27/319  lung]
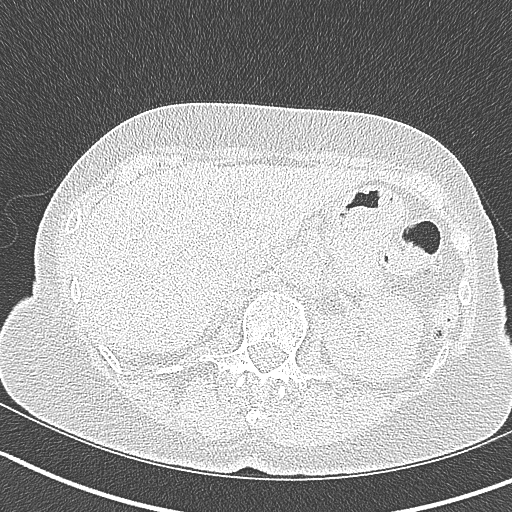
[im 54/319  lung]
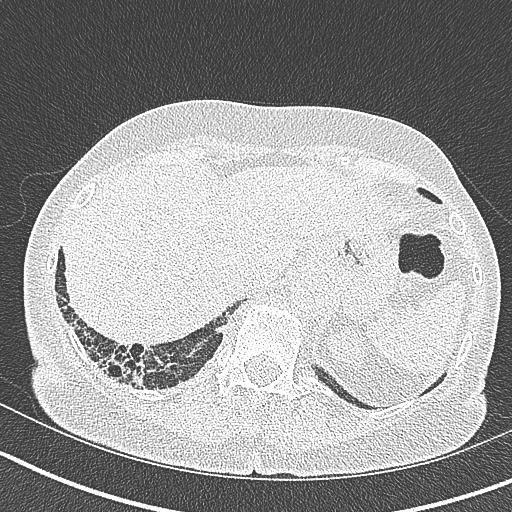
[im 80/319  lung]
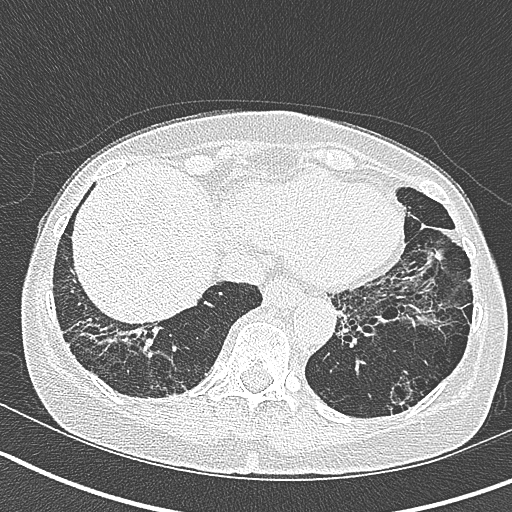
[im 107/319  lung]
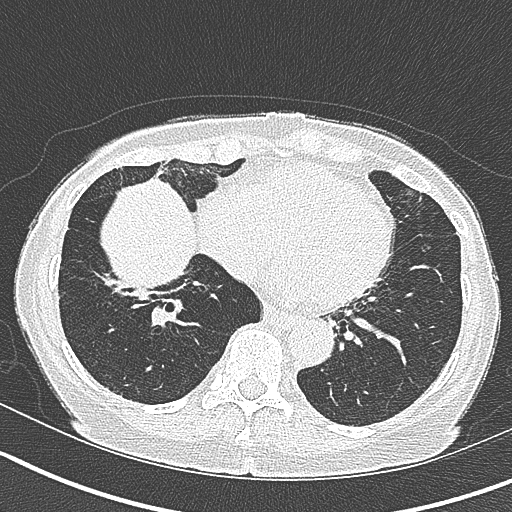
[im 133/319  mediastinal]
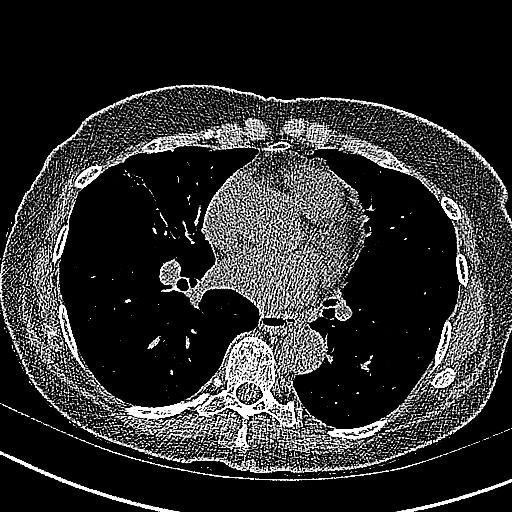
[im 133/319  lung]
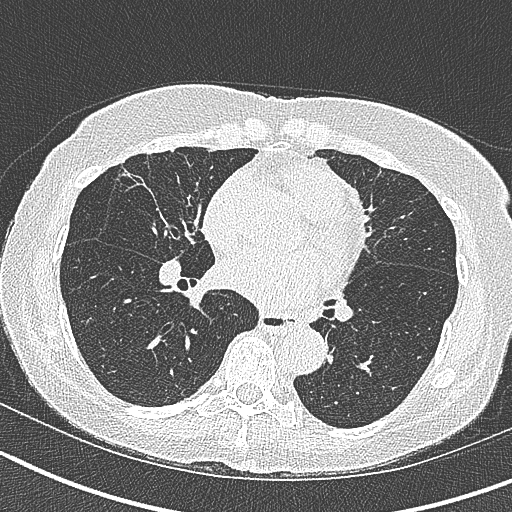
[im 160/319  lung]
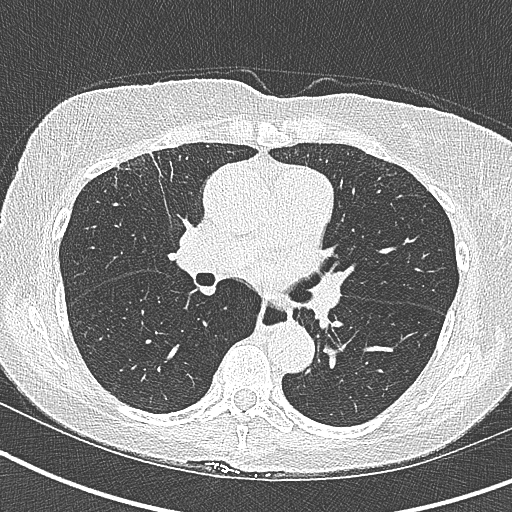
[im 186/319  lung]
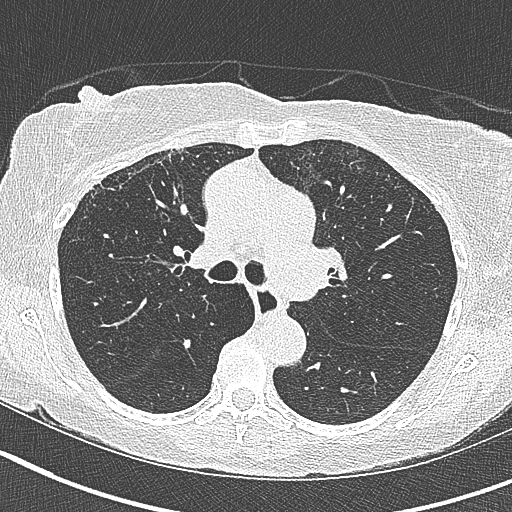
[im 213/319  lung]
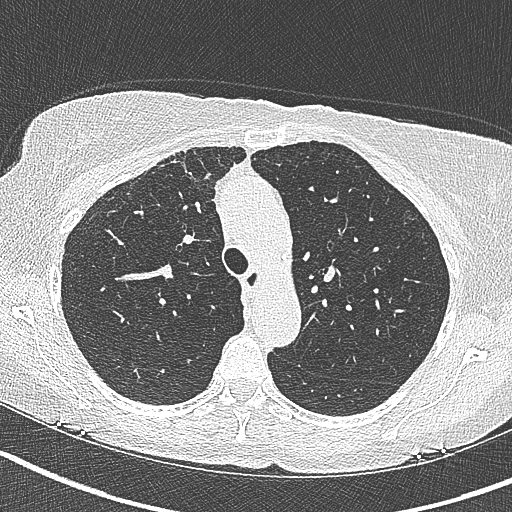
[im 239/319  mediastinal]
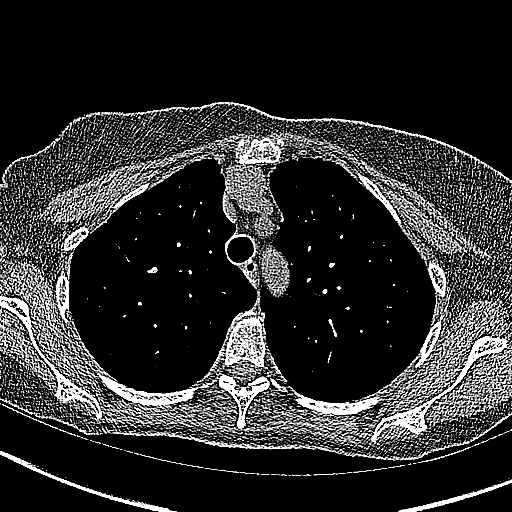
[im 239/319  lung]
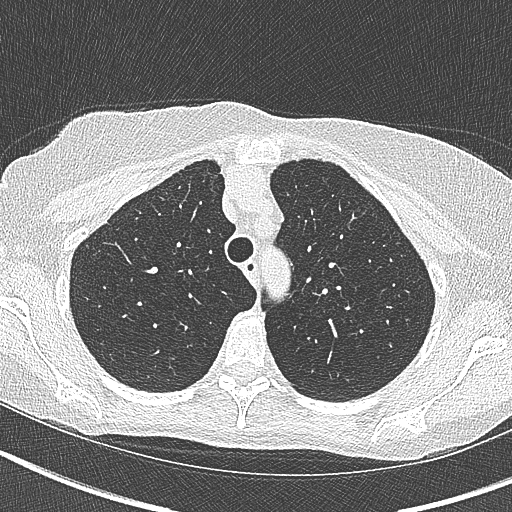
[im 266/319  lung]
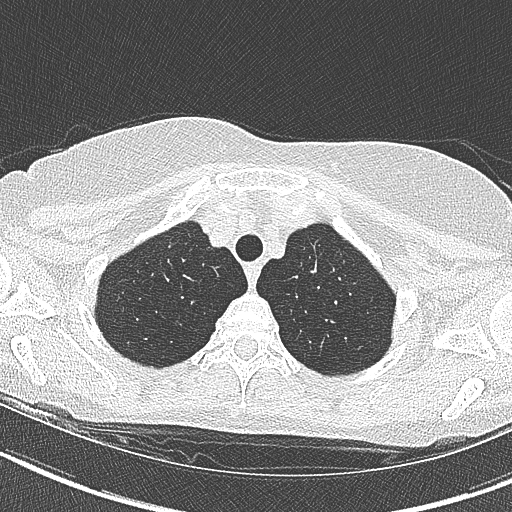
[im 292/319  lung]
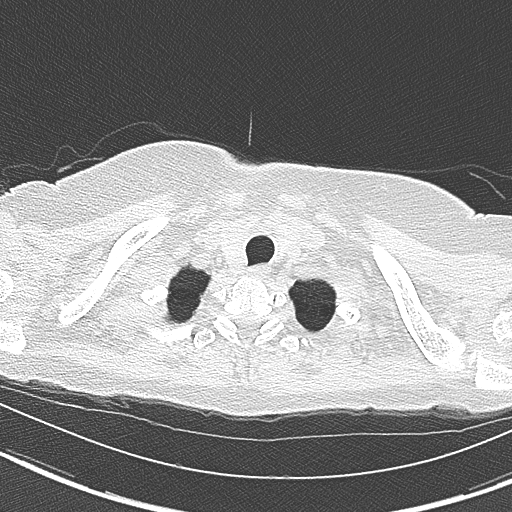

[Series 7: coronal · coronal · 0.59mm/px · 3 of 108 slices shown]
[im 22/108  lung]
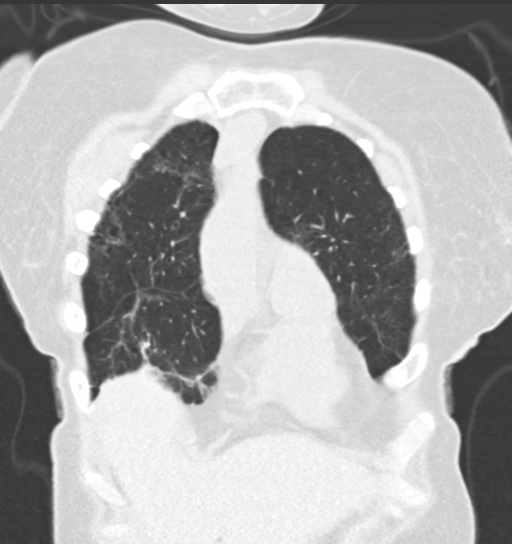
[im 43/108  lung]
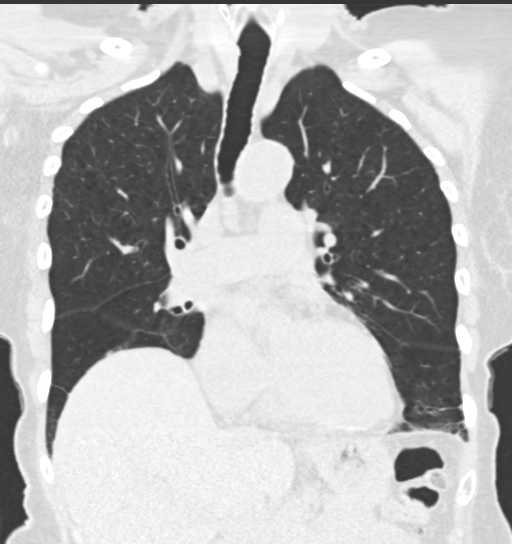
[im 65/108  lung]
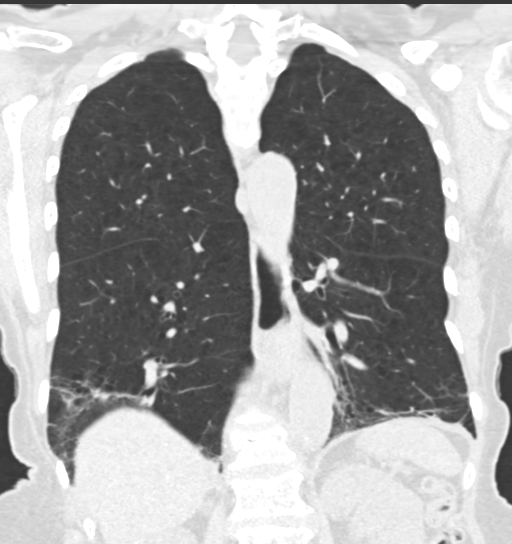

[14 of 36 positions shown; findings below may reference images not displayed]

FINDINGS: Cardiovascular: Atherosclerotic calcification of the aorta, aortic
valve and coronary arteries. Pulmonic trunk is enlarged. Heart size
normal. No pericardial effusion.

Mediastinum/Nodes: 1.2 cm low-attenuation left thyroid nodule. No
follow-up recommended (ref: [HOSPITAL]. [DATE]):
143-50).No pathologically enlarged mediastinal or axillary lymph
nodes. Hilar regions are difficult to definitively evaluate without
IV contrast but appear grossly unremarkable. Esophagus is slightly
dilated.

Lungs/Pleura: Centrilobular and paraseptal emphysema. There is
traction bronchiectasis, ground-glass and mild architectural
distortion predominating in the lower lobes, as on prior exams.
Minimal subpleural reticulation is seen in association. No
honeycombing. 5 mm anterior left lower lobe nodule (3/120) is
unchanged from 07/17/2017 and considered benign. No pleural fluid.
Airway is unremarkable. Minimal air trapping.

Upper Abdomen: Visualized portions of the liver, adrenal glands,
kidneys, spleen, pancreas, stomach and bowel are grossly
unremarkable. No upper abdominal adenopathy.

Musculoskeletal: No worrisome lytic or sclerotic lesions. T12 and L1
compression fractures are new. No worrisome lytic or sclerotic
lesions.
IMPRESSION: 1. Pulmonary parenchymal pattern of fibrosis may be due to fibrotic
nonspecific interstitial pneumonitis. Chronic hypersensitivity
pneumonitis is not excluded. Findings are indeterminate for UIP per
consensus guidelines: Diagnosis of Idiopathic Pulmonary Fibrosis: An
Official ATS/ERS/JRS/ALAT Clinical Practice Guideline. Am J Respir
Crit Care Med Vol 198, Oxendine 5, ppe33-e[DATE].
2. Aortic atherosclerosis (1G6RR-E9J.J). Coronary artery
calcification.
3. Enlarged pulmonic trunk, indicative of pulmonary arterial
hypertension.
4.  Emphysema (1G6RR-C3A.H).

## 2021-10-02 ENCOUNTER — Ambulatory Visit (HOSPITAL_COMMUNITY): Payer: Medicare PPO

## 2021-10-09 ENCOUNTER — Ambulatory Visit (HOSPITAL_COMMUNITY)
Admission: RE | Admit: 2021-10-09 | Discharge: 2021-10-09 | Disposition: A | Discharge status: Home / Self Care | Payer: Medicare PPO | Source: Ambulatory Visit | Attending: Gastroenterology | Admitting: Gastroenterology

## 2021-10-09 ENCOUNTER — Other Ambulatory Visit: Payer: Self-pay

## 2021-10-09 ENCOUNTER — Encounter (HOSPITAL_COMMUNITY): Payer: Self-pay

## 2021-10-09 DIAGNOSIS — R1031 Right lower quadrant pain: Secondary | ICD-10-CM | POA: Diagnosis not present

## 2021-10-09 DIAGNOSIS — I700 Atherosclerosis of aorta: Secondary | ICD-10-CM | POA: Diagnosis not present

## 2021-10-09 DIAGNOSIS — G8929 Other chronic pain: Secondary | ICD-10-CM | POA: Insufficient documentation

## 2021-10-09 MED ORDER — SODIUM CHLORIDE (PF) 0.9 % IJ SOLN
INTRAMUSCULAR | Status: AC
  Filled 2021-10-09: qty 50

## 2021-10-09 MED ORDER — IOHEXOL 300 MG/ML  SOLN
100.0000 mL | Freq: Once | INTRAMUSCULAR | Status: AC | PRN
  Administered 2021-10-09: 11:00:00 100 mL via INTRAVENOUS

## 2021-10-12 ENCOUNTER — Encounter: Payer: Self-pay | Admitting: Adult Health

## 2021-10-12 ENCOUNTER — Other Ambulatory Visit: Payer: Self-pay | Admitting: Internal Medicine

## 2021-10-12 DIAGNOSIS — S22080A Wedge compression fracture of T11-T12 vertebra, initial encounter for closed fracture: Secondary | ICD-10-CM | POA: Insufficient documentation

## 2021-10-16 ENCOUNTER — Encounter: Payer: Self-pay | Admitting: Adult Health

## 2021-10-16 ENCOUNTER — Ambulatory Visit: Payer: Medicare PPO | Admitting: Adult Health

## 2021-10-16 ENCOUNTER — Other Ambulatory Visit: Payer: Self-pay

## 2021-10-16 VITALS — BP 120/76 | HR 95 | Temp 97.9°F | Wt 150.0 lb

## 2021-10-16 DIAGNOSIS — S22080S Wedge compression fracture of T11-T12 vertebra, sequela: Secondary | ICD-10-CM

## 2021-10-16 DIAGNOSIS — R1013 Epigastric pain: Secondary | ICD-10-CM | POA: Diagnosis not present

## 2021-10-16 DIAGNOSIS — M8580 Other specified disorders of bone density and structure, unspecified site: Secondary | ICD-10-CM

## 2021-10-16 DIAGNOSIS — K449 Diaphragmatic hernia without obstruction or gangrene: Secondary | ICD-10-CM

## 2021-10-16 NOTE — Progress Notes (Signed)
Assessment and Plan:  Catherine Parks was seen today for results.  Diagnoses and all orders for this visit:  Compression fracture of T12 vertebra, sequela Patient denies back pain, appears likely chronic/stable Lifestyle discussed; appropriate bending/lifting reviewed Core strengthening encouraged with regular exercise Follow up if any new pain or radicular sx  Hypercalcemia Suspect primary hyperparathyroid; pending endocrinology evaluation for further recommendations due to calcium trending up   Osteopenia, unspecified location Discussed need for follow up DEXA; order is in system and pending She plans to get this year with mammogram  Continue calcium in diet, vit D supplement; regular weight bearing/resistance exercise  Epigastric discomfort Hiatal hernia/GERD ? Most likely explanation for her sx, did have gastritis on EGD, patient does perceive improvement with PPI with rare recent breakthrough with tomato sauce; will communicate back to Dr. Tarri Glenn GERD diet/lifestyle reviewed at length today, handout given  Will send communication back to Dr. Tarri Glenn    Further disposition pending results of labs. Discussed med's effects and SE's.   Over 30 minutes of exam, counseling, chart review, and critical decision making was performed.   Future Appointments  Date Time Provider [place]  10/29/2021  9:10 AM Shamleffer, Melanie Crazier, MD [facility] None  12/05/2021  3:00 PM Liane Comber, NP [facility] None  03/11/2022 10:30 AM Liane Comber, NP [facility] None    ------------------------------------------------------------------------------------------------------------------   HPI BP 120/76    Pulse 95    Temp 97.9 F (36.6 C)    Wt 150 lb (68 kg)    SpO2 99%    BMI 24.21 kg/m  81 y.o.female presents to discuss persistent GI sx and recent CT abdomen results ordered by GI Dr. Tarri Glenn that incidentally showed a T12 compression fracture. Dr. Tarri Glenn sent note to our office  questioning possible relation to her ongoing GI sx.   On review compression fracture is old, has seen and appears stable on several previous CT scans. She has known osteopenia, planning follow up by DEXA later this year. She has had intermittent hypercalcemia, workup suggestive of primary hyperparathyroid, pending evaluation by endocrinologist 10/29/2021.   Patient denies any back pain or radicular/burning type pain.   She reports not having any abdominal pain, just an intermittent discomfort, had felt "like a warmth" in epigastric area. She denies positional/movement changes. She correlates with food intake. She reports sx had actually improved recently since on protonix 40 mg BID by GI, hadn't had sx in several weeks until yesterday had some tomato sauce on pasta and had an episode.   She had EGD in 07/04/2021 by Dr. Tarri Glenn that showed:   - The examined esophagus was normal. - A medium-sized hiatal hernia was present. - Patchy minimal inflammation characterized by erythema, friability and granularity was found in the gastric body. Biopsies were taken with a cold forceps for histology. Estimated blood loss was minimal. - Patchy mildly erythematous mucosa without active bleeding and with no stigmata of bleeding was found in the duodenal bulb. There is a small nodule in the duodenal bulb. Biopsies were taken from the nodule in a tunneled fashion with a cold forceps for histology. Biopies were also taken from the bulb and second portion of the duodenum. Estimated blood loss was minimal. - The exam was otherwise without abnormality.  Pathology report was fairly benign, negative for H. Pylori or metaplasia.     Past Medical History:  Diagnosis Date   Allergic rhinitis    Arthritis    Bronchiectasis ([facility])    Colon polyps    Diverticulosis  Emphysema of lung ([place])    GERD (gastroesophageal reflux disease)    HSV-1 (herpes simplex virus 1) infection    Hypertension    Pulmonary nodule  07/10/2017   CT 01/2017, CT 07/2017 - stable on follow up, no further follow up recommended.     Allergies  Allergen Reactions   Ace Inhibitors Cough   Lemon Oil Hives    Reaction to lemons   Natural Vegetable Orange [Psyllium]     oranges   Shellfish Allergy Swelling and Hives    Current Outpatient Medications on File Prior to Visit  Medication Sig   acetaminophen (TYLENOL) 500 MG tablet Take 500 mg by mouth every 6 (six) hours as needed (pain).   amLODipine (NORVASC) 10 MG tablet TAKE 1 TABLET BY MOUTH EVERY DAY FOR BLOOD PRESSURE   Ascorbic Acid (VITAMIN C) 100 MG tablet Take 100 mg by mouth daily.   azelastine (ASTELIN) 0.1 % nasal spray    cholecalciferol (VITAMIN D) 1000 units tablet Take 6,000 Units by mouth daily after lunch.    cloNIDine (CATAPRES) 0.2 MG tablet 0.2 mg as needed.   famotidine (PEPCID) 40 MG tablet TAKE 1 TABLET DAILY TO PREVENT HEARTBURN & INDIGESTION   Flaxseed, Linseed, (FLAX SEED OIL) 1000 MG CAPS Take 1,000 mg by mouth daily after lunch.   halobetasol (ULTRAVATE) 0.05 % cream Apply topically 2 (two) times daily. Apply to Eczema Rash 2 x/day   hydrocortisone 2.5 % cream    Magnesium 250 MG TABS Take 1 tablet (250 mg total) by mouth daily.   Naphazoline HCl (CLEAR EYES OP) Place 1 drop into both eyes daily.   nitroGLYCERIN (NITROSTAT) 0.4 MG SL tablet Place 0.4 mg under the tongue every 5 (five) minutes as needed for chest pain.   OVER THE COUNTER MEDICATION Takes OTC eczema cream PRN.   pantoprazole (PROTONIX) 40 MG tablet TAKE 1 TABLET BY MOUTH TWICE A DAY   rosuvastatin (CRESTOR) 5 MG tablet TAKE 1 TAB IN THE EVENING 3 DAYS A WEEK (MWF) FOR CHOLESTEROL TO REDUCE HEART ATTACK/STROKE RISK.   triamterene-hydrochlorothiazide (MAXZIDE-25) 37.5-25 MG tablet TAKE 1 TABLET DAILY FOR BP & FLUID   Wheat Dextrin (BENEFIBER PO) Take 1 Dose by mouth daily.   zinc gluconate 50 MG tablet Take 50 mg by mouth daily.   No current facility-administered medications on  file prior to visit.    Allergies:  Allergies  Allergen Reactions   Ace Inhibitors Cough   Lemon Oil Hives    Reaction to lemons   Natural Vegetable Orange [Psyllium]     oranges   Shellfish Allergy Swelling and Hives   Surgical History:  She  has a past surgical history that includes Eye surgery; Abdominal hysterectomy; Colonoscopy; Direct laryngoscopy (N/A, 10/21/2014); Mass excision (Right, 10/21/2014); Excision nasal mass (Right, 10/21/2014); and Cesarean section (1969). Family History:  Herfamily history includes Breast cancer in her maternal grandmother and sister; Cancer in her brother and brother; Heart attack in her father; Hypertension in her mother; Other in her brother; Prostate cancer in her brother; Stroke in her mother. Social History:   reports that she quit smoking about 7 years ago. Her smoking use included cigarettes. She started smoking about 65 years ago. She has a 58.00 pack-year smoking history. She has never used smokeless tobacco. She reports that she does not drink alcohol and does not use drugs.   ROS: all negative except above.   Physical Exam:  BP 120/76    Pulse 95  Temp 97.9 F (36.6 C)    Wt 150 lb (68 kg)    SpO2 99%    BMI 24.21 kg/m   General Appearance: Well nourished, in no apparent distress. Eyes: PERRLA, conjunctiva no swelling or erythema ENT/Mouth: No erythema, swelling, or exudate on post pharynx.  Tonsils not swollen or erythematous. Hearing normal.  Neck: Supple, thyroid normal.  Respiratory: Respiratory effort normal, BS equal bilaterally without rales, rhonchi, wheezing or stridor.  Cardio: RRR with no MRGs. Brisk peripheral pulses without edema.  Abdomen: Soft, + BS.  Non tender, no guarding, rebound, hernias, masses. Lymphatics: Non tender without lymphadenopathy.  Musculoskeletal: Full ROM, 5/5 strength, normal gait. No midline tenderness. No paraspinal spasm.  Skin: Warm, dry without rashes, lesions, ecchymosis.  Neuro: Normal  muscle tone Psych: Awake and oriented X 3, normal affect, Insight and Judgment appropriate.     Catherine Ribas, NP 11:27 AM [facility]

## 2021-10-29 ENCOUNTER — Telehealth: Payer: Self-pay

## 2021-10-29 ENCOUNTER — Encounter: Payer: Self-pay | Admitting: Internal Medicine

## 2021-10-29 ENCOUNTER — Other Ambulatory Visit: Payer: Self-pay

## 2021-10-29 ENCOUNTER — Other Ambulatory Visit (INDEPENDENT_AMBULATORY_CARE_PROVIDER_SITE_OTHER): Payer: Medicare PPO

## 2021-10-29 ENCOUNTER — Ambulatory Visit: Payer: Medicare PPO | Admitting: Internal Medicine

## 2021-10-29 VITALS — BP 130/82 | HR 65 | Ht 66.0 in | Wt 151.8 lb

## 2021-10-29 DIAGNOSIS — E673 Hypervitaminosis D: Secondary | ICD-10-CM

## 2021-10-29 DIAGNOSIS — E210 Primary hyperparathyroidism: Secondary | ICD-10-CM

## 2021-10-29 LAB — VITAMIN D 25 HYDROXY (VIT D DEFICIENCY, FRACTURES): VITD: 113.14 ng/mL (ref 30.00–100.00)

## 2021-10-29 NOTE — Telephone Encounter (Signed)
Critical lab- vitamin D 113.  System is down right now so it has not been out in system yet.

## 2021-10-29 NOTE — Telephone Encounter (Signed)
Vm left for patient to callback and mychart message sent

## 2021-10-29 NOTE — Progress Notes (Signed)
Name: Catherine Parks  MRN/ DOB: [id number], [birth year]    Age/ Sex: 81 y.o., female    PCP: Unk Pinto, MD   Reason for Endocrinology Evaluation: Hypercalcemia      Date of Initial Endocrinology Evaluation: 10/29/2021     HPI: Catherine Parks is a 81 y.o. female with a past medical history of HTN , low bone density and dyslipidemia . The patient presented for initial endocrinology clinic visit on 10/29/2021 for consultative assistance with her Hypercalcemia .   Catherine Parks indicates that she was first diagnosed with hypercalcemia in  her 17's. In review of her chart she has been noted with hypercalcemia in 2015, with a max level of 11.1 mg/dL (uncorrected)  in 09/2021 with inappropriately normal PTH  at 59 pg/mL in 12/2020. Pr  She denies  experienced symptoms of  polyuria, polydipsia, but has constipation    She denies  use of over the counter calcium (including supplements, Tums, Rolaids, or other calcium containing antacids),or  lithium.  She is on  HCTZ, and vitamin D 7000 iu supplement.  She denies  history of kidney stones, kidney disease, liver disease, granulomatous disease. She denies  osteoporosis or prior fractures. Daily dietary calcium intake: 0 servings . She denies  family history of osteoporosis, parathyroid disease, thyroid disease.   Has occasional hip pains     HISTORY:  Past Medical History:  Past Medical History:  Diagnosis Date   Allergic rhinitis    Arthritis    Bronchiectasis ([place])    Colon polyps    Diverticulosis    Emphysema of lung ([place])    GERD (gastroesophageal reflux disease)    HSV-1 (herpes simplex virus 1) infection    Hypertension    Pulmonary nodule 07/10/2017   CT 01/2017, CT 07/2017 - stable on follow up, no further follow up recommended.   Past Surgical History:  Past Surgical History:  Procedure Laterality Date   ABDOMINAL HYSTERECTOMY     ovaries spared   CESAREAN SECTION  1969   COLONOSCOPY     DIRECT LARYNGOSCOPY  N/A 10/21/2014   Procedure: DIRECT LARYNGOSCOPY;  Surgeon: Rozetta Nunnery, MD;  Location: [facility];  Service: ENT;  Laterality: N/A;   EXCISION NASAL MASS Right 10/21/2014   Procedure: EXCISION NASOPHARYNGEAL MASS;  Surgeon: Rozetta Nunnery, MD;  Location: [facility];  Service: ENT;  Laterality: Right;   EYE SURGERY     cataracts   MASS EXCISION Right 10/21/2014   Procedure: RIGHT NECK NODE BIOPSY;  Surgeon: Rozetta Nunnery, MD;  Location: [facility];  Service: ENT;  Laterality: Right;    Social History:  reports that she quit smoking about 7 years ago. Her smoking use included cigarettes. She started smoking about 65 years ago. She has a 58.00 pack-year smoking history. She has never used smokeless tobacco. She reports that she does not drink alcohol and does not use drugs. Family History: family history includes Breast cancer in her maternal grandmother and sister; Cancer in her brother and brother; Heart attack in her father; Hypertension in her mother; Other in her brother; Prostate cancer in her brother; Stroke in her mother.   HOME MEDICATIONS: Allergies as of 10/29/2021       Reactions   Ace Inhibitors Cough   Lemon Oil Hives   Reaction to lemons   Natural Vegetable Orange [psyllium]    oranges   Shellfish Allergy Swelling, Hives  Medication List        Accurate as of October 29, 2021  9:05 AM. If you have any questions, ask your nurse or doctor.          acetaminophen 500 MG tablet Commonly known as: TYLENOL Take 500 mg by mouth every 6 (six) hours as needed (pain).   amLODipine 10 MG tablet Commonly known as: NORVASC TAKE 1 TABLET BY MOUTH EVERY DAY FOR BLOOD PRESSURE   azelastine 0.1 % nasal spray Commonly known as: ASTELIN   BENEFIBER PO Take 1 Dose by mouth daily.   cholecalciferol 1000 units tablet Commonly known as: VITAMIN D Take 6,000 Units by mouth daily after lunch.   CLEAR EYES  OP Place 1 drop into both eyes daily.   cloNIDine 0.2 MG tablet Commonly known as: CATAPRES 0.2 mg as needed.   famotidine 40 MG tablet Commonly known as: PEPCID TAKE 1 TABLET DAILY TO PREVENT HEARTBURN & INDIGESTION   Flax Seed Oil 1000 MG Caps Take 1,000 mg by mouth daily after lunch.   halobetasol 0.05 % cream Commonly known as: Ultravate Apply topically 2 (two) times daily. Apply to Eczema Rash 2 x/day   hydrocortisone 2.5 % cream   Magnesium 250 MG Tabs Take 1 tablet (250 mg total) by mouth daily.   nitroGLYCERIN 0.4 MG SL tablet Commonly known as: NITROSTAT Place 0.4 mg under the tongue every 5 (five) minutes as needed for chest pain.   OVER THE COUNTER MEDICATION Takes OTC eczema cream PRN.   pantoprazole 40 MG tablet Commonly known as: PROTONIX TAKE 1 TABLET BY MOUTH TWICE A DAY   rosuvastatin 5 MG tablet Commonly known as: CRESTOR TAKE 1 TAB IN THE EVENING 3 DAYS A WEEK (MWF) FOR CHOLESTEROL TO REDUCE HEART ATTACK/STROKE RISK.   triamterene-hydrochlorothiazide 37.5-25 MG tablet Commonly known as: MAXZIDE-25 TAKE 1 TABLET DAILY FOR BP & FLUID   vitamin C 100 MG tablet Take 100 mg by mouth daily.   zinc gluconate 50 MG tablet Take 50 mg by mouth daily.          REVIEW OF SYSTEMS: A comprehensive ROS was conducted with the patient and is negative except as per HPI    OBJECTIVE:  VS: BP 130/82 (BP Location: Left Arm, Patient Position: Sitting, Cuff Size: Small)    Pulse 65    Ht 5\' 6"  (1.676 m)    Wt 151 lb 12.8 oz (68.9 kg)    SpO2 99%    BMI 24.50 kg/m    Wt Readings from Last 3 Encounters:  10/29/21 151 lb 12.8 oz (68.9 kg)  10/16/21 150 lb (68 kg)  09/20/21 150 lb 3.2 oz (68.1 kg)     EXAM: General: Pt appears well and is in NAD  Neck: General: Supple without adenopathy. Thyroid: Thyroid size normal.  No goiter or nodules appreciated.  Lungs: Clear with good BS bilat with no rales, rhonchi, or wheezes  Heart: Auscultation: RRR.   Abdomen: Normoactive bowel sounds, soft, nontender, without masses or organomegaly palpable  Extremities:  BL LE: No pretibial edema normal ROM and strength.  Mental Status: Judgment, insight: Intact Orientation: Oriented to time, place, and person Mood and affect: No depression, anxiety, or agitation     DATA REVIEWED:    Latest Reference Range & Units 10/29/21 09:39  Sodium 135 - 145 mEq/L 140  Potassium 3.5 - 5.1 mEq/L 3.7  Chloride 96 - 112 mEq/L 102  CO2 19 - 32 mEq/L 30  Glucose 70 - 99  mg/dL 99  BUN 6 - 23 mg/dL 15  Creatinine 0.40 - 1.20 mg/dL 0.92  Calcium 8.4 - 10.5 mg/dL 10.4  Albumin 3.5 - 5.2 g/dL 4.4  GFR >60.00 mL/min 58.60 (L)    Latest Reference Range & Units 10/29/21 09:39  VITD 30.00 - 100.00 ng/mL 113.14 (HH)    Latest Reference Range & Units 10/29/21 09:39  PTH, Intact 16 - 77 pg/mL 81 (H)     Latest Reference Range & Units 09/19/21 11:35  Sodium 135 - 145 mEq/L 139  Potassium 3.5 - 5.1 mEq/L 3.8  Chloride 96 - 112 mEq/L 102  CO2 19 - 32 mEq/L 28  Glucose 70 - 99 mg/dL 97  BUN 6 - 23 mg/dL 18  Creatinine 0.40 - 1.20 mg/dL 0.97  Calcium 8.4 - 10.5 mg/dL 11.1 (H)  GFR >60.00 mL/min 55.04 (L)       ASSESSMENT/PLAN/RECOMMENDATIONS:   Primary Hyperparathyroidism:  - Chronic history of hypercalcemia per pt since her 40's.  - No indication for surgical intervention at this time  - Will proceed with DXA  -24-hour urine collection in 02/2021 with 48 mg, concomitant 24-hour creatinine not available    Recommendations  - Stay Hydrated  - Avoid over the counter calcium tablets  - Maintain 2-3 servings of dietary calcium daily ( examples: low fat yogurt, cheese, milk  and green leafy vegetables )    2. HyperVitaminosis D   - Pt to stop vitamin D and will recheck on next visit    F/U in 4 months   Signed electronically by: Mack Guise, MD  [facility] Endocrinology  [facility] [address] [place], [place] [address] Phone: [phone] FAX: [phone]   CC: Unk Pinto, MD [address] [place] [address] Phone: [phone] Fax: [phone]   Return to Endocrinology clinic as below: Future Appointments  Date Time Provider [place]  10/29/2021  9:10 AM Dvid Pendry, Melanie Crazier, MD [facility] None  12/05/2021  3:00 PM Liane Comber, NP [facility] None  03/11/2022 10:30 AM Liane Comber, NP [facility] None

## 2021-10-29 NOTE — Patient Instructions (Signed)
-   Stay Hydrated  - Avoid over the counter calcium tablets  - Maintain 2-3 servings of dietary calcium daily ( examples: low fat yogurt, cheese, milk  and green leafy vegetables )      

## 2021-10-30 ENCOUNTER — Encounter: Payer: Self-pay | Admitting: Internal Medicine

## 2021-10-30 LAB — BASIC METABOLIC PANEL
BUN: 15 mg/dL (ref 6–23)
CO2: 30 mEq/L (ref 19–32)
Calcium: 10.4 mg/dL (ref 8.4–10.5)
Chloride: 102 mEq/L (ref 96–112)
Creatinine, Ser: 0.92 mg/dL (ref 0.40–1.20)
GFR: 58.6 mL/min — ABNORMAL LOW (ref >60.00)
Glucose, Bld: 99 mg/dL (ref 70–99)
Potassium: 3.7 mEq/L (ref 3.5–5.1)
Sodium: 140 mEq/L (ref 135–145)

## 2021-10-30 LAB — ALBUMIN: Albumin: 4.4 g/dL (ref 3.5–5.2)

## 2021-10-30 LAB — PARATHYROID HORMONE, INTACT (NO CA): PTH: 81 pg/mL — ABNORMAL HIGH (ref 16–77)

## 2021-10-30 NOTE — Telephone Encounter (Signed)
Patient notified and verbalized understanding. 

## 2021-10-30 NOTE — Telephone Encounter (Signed)
duplicate

## 2021-10-31 DIAGNOSIS — E673 Hypervitaminosis D: Secondary | ICD-10-CM | POA: Insufficient documentation

## 2021-10-31 DIAGNOSIS — E210 Primary hyperparathyroidism: Secondary | ICD-10-CM | POA: Insufficient documentation

## 2021-11-05 DIAGNOSIS — M8589 Other specified disorders of bone density and structure, multiple sites: Secondary | ICD-10-CM | POA: Diagnosis not present

## 2021-11-05 DIAGNOSIS — Z780 Asymptomatic menopausal state: Secondary | ICD-10-CM | POA: Diagnosis not present

## 2021-11-06 ENCOUNTER — Encounter: Payer: Self-pay | Admitting: Internal Medicine

## 2021-11-10 NOTE — Progress Notes (Signed)
Interstitial Lung Disease Multidisciplinary Conference   Catherine Parks    MRN [id number]    DOB 01-29-1941  Primary Care Physician:McKeown, Gwyndolyn Saxon, MD  Referring Physician: Ann Lions  Time of Conference: 7.30am- 8.30am Date of conference: 10/30/21 Location of Conference: -  Virtual  Participating Pulmonary: Dr. 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 ANA positive and double-stranded DNA positive 181 in 2017. Positive IGE - 3244 , and positive enviromental allergy test RAST - Nov 2017. Prior > 50 pack smoker. Asymptomatic mostly - very mild doe for stairs. Spirometry normal but DLCO reduced 43% - reflective of combined emphyhsema/fibrosis - PFT mild progression 2018-> 2023. Last HRCT - nov 2022 - described as prob UIP. -What is the MDD dx on radiology and clinical?" - she does not want standard of care anti-fibrotic  Serology:   Latest Reference Range & Units 10/12/10 20:28 12/03/10 08:47 08/02/16 10:29  Anti Nuclear Antibody (ANA) Negative    Positive !  Angiotensin 1 CE 8 - 52 U/L  60 (H)   Cyclic Citrullin Peptide Ab Units   <16  dsDNA Ab 0 - 9 IU/mL   181 (H)  ENA RNP Ab 0.0 - 0.9 AI   <0.2  ENA SSA (RO) Ab 0.0 - 0.9 AI   <0.2  ENA SSB (LA) Ab 0.0 - 0.9 AI   <0.2  Tissue Transglutaminase Ab, IgA <20 units 4.2 U/ML    ENA SM Ab Ser-aCnc 0.0 - 0.9 AI   <0.2  !: Data is abnormal (H): Data is abnormally high  MDD discussion of CT scan    - Date or time period of scan:  "HRCT: 07/24/2021 HRCT: 04/24/2020 HRCT: 08/07/2016 "  - Features mentioned:  - CT - has emphysema but in terims of ILD has subpleural bronchiectactis and some HC in RLL Agree with prob UIP. Very slow progression on CT over long period time  - What is the final  conclusion per 2018 ATS/Fleischner Criteria - PRobable UIP with very slow progression over time  Pathology discussion of biopsy no:  PFTs.  PFT Results Latest Ref Rng & Units 09/20/2021 08/31/2020 04/20/2020 02/26/2019 06/13/2017 12/27/2016 09/27/2016  FVC-Pre L 2.65 2.64 2.78 2.75 2.89 2.81 2.92  FVC-Predicted Pre % 117 115 121 118 121 117 121  FVC-Post L - - - 2.75 - - 2.95  FVC-Predicted Post % - - - 118 - - 122  Pre FEV1/FVC % % 62 65 64 64 66 66 69  Post FEV1/FCV % % - - - 65 - - 70  FEV1-Pre L 1.65 1.72 1.78 1.77 1.91 1.85 2.01  FEV1-Predicted Pre % 94 97 100 98 103 99 108  FEV1-Post L - - - 1.79 - - 2.07  DLCO uncorrected ml/min/mmHg 10.04 13.53 10.81 11.55 12.85 10.95 13.33  DLCO UNC% % 49 67 53 57 47 40 49  DLCO corrected ml/min/mmHg 10.01 13.53 10.92 11.66 12.56 - 13.63  DLCO COR %Predicted % 49 67 54 57 46 - 50  DLVA Predicted % 59 84 59 68 51 48 60  TLC L - - - 5.10 - - 5.05  TLC % Predicted % - - - 95 - - 94  RV % Predicted % - - - 99 - -  18      MDD Impression/Recs: IPF. She has positive DS-DNA but the radiologic pattern is not c/w IPAF. Next visit recheck ANA pattern and DS=-DNA   Time Spent in preparation and discussion:  > 30 min    SIGNATURE   Dr. Brand Males, M.D., F.C.C.P,  Pulmonary and Critical Care Medicine Staff Physician, [place] Director - Interstitial Lung Disease  Program  P[facility] [facility] [place], [place], [address]  Pager: [phone], If no answer or between  15:00h - 7:00h: call [phone] Telephone: [phone]  11:17 AM 11/10/2021 ...................................................................................................................Marland Kitchen References: Diagnosis of Hypersensitivity Pneumonitis in Adults. An Official ATS/JRS/ALAT Clinical Practice Guideline. Ragu G et al, [place] Aug 1;202(3):e36-e69.       Diagnosis of  Idiopathic Pulmonary Fibrosis. An Official ATS/ERS/JRS/ALAT Clinical Practice Guideline. Raghu G et al, [place]. 2018 Sep 1;198(5):e44-e68.   IPF Suspected   Histopath ology Pattern      UIP  Probable UIP  Indeterminate for  UIP  Alternative  diagnosis    UIP  IPF  IPF  IPF  Non-IPF dx   HRCT   Probabe UIP  IPF  IPF  IPF (Likely)**  Non-IPF dx  Pattern  Indeterminate for UIP  IPF  IPF (Likely)**  Indeterminate  for IPF**  Non-IPF dx    Alternative diagnosis  IPF (Likely)**/ non-IPF dx  Non-IPF dx  Non-IPF dx  Non-IPF dx     Idiopathic pulmonary fibrosis diagnosis based upon HRCT and Biopsy paterns.  ** IPF is the likely diagnosis when any of following features are present:  Moderate-to-severe traction bronchiectasis/bronchiolectasis (defined as mild traction bronchiectasis/bronchiolectasis in four or more lobes including the lingual as a lobe, or moderate to severe traction bronchiectasis in two or more lobes) in a man over age 27 years or in a woman over age 28 years Extensive (>30%) reticulation on HRCT and an age >70 years  Increased neutrophils and/or absence of lymphocytosis in BAL fluid  Multidisciplinary discussion reaches a confident diagnosis of IPF.   **Indeterminate for IPF  Without an adequate biopsy is unlikely to be IPF  With an adequate biopsy may be reclassified to a more specific diagnosis after multidisciplinary discussion and/or additional consultation.   dx = diagnosis; HRCT = high-resolution computed tomography; IPF = idiopathic pulmonary fibrosis; UIP = usual interstitial pneumonia.  Xxxx  IPAF DIAGNOSTIC CRITERIA   Classification criteria for "interstitial pneumonia with autoimmune features (IPAF)" Presence of an interstitial pneumonia (by HRCT or surgical lung biopsy) and, Exclusion of alternative etiologies and, Does not meet criteria of a defined connective tissue disease and, At least one feature from at  least two of these domains: - Clinical domain - Serologic domain - Morphologic domain  A. Clinical domain - Distal digital fissuring (ie, "mechanic hands") -Distal digital tip ulceration - Inflammatory arthritis or polyarticular morning joint stiffness ?60 minutes - Palmar telangiectasia - Raynaud phenomenon - Unexplained digital edema - Unexplained fixed rash on the digital extensor surfaces (Gottron's sign)  B. Serologic domain - ANA ?1:320 titer, diffuse, speckled, homogeneous patterns or ANA nucleolar pattern (any titer) or ANA centromere pattern (any titer)  -Rheumatoid factor ?2 upper limit of normal -Anti-CCP -Anti-dsDNA -Anti-Ro (SS-A) -Anti-La (SS-B) Anti-ribonucleoprotein -Anti-Smith -Anti-topoisomerase (Scl-70) -Anti-tRNA synthetase (eg, Jo-1, PL-7, PL-12; others are: EJ, OJ, KS, Zo, tRS) -Anti-PM-Scl - Anti-MDA-5  C. Morphologic domain Suggestive radiology patterns by  HRCT NSIP OP NSIP with OP overlap LIP Histopathology patterns or features by surgical lung biopsy: NSIP OP NSIP with OP overlap LIP Interstitial lymphoid aggregates with germinal centers Diffuse lymphoplasmacytic infiltration (with or without lymphoid follicles) Multi-compartment involvement (in addition to interstitial pneumonia): Unexplained pleural effusion or thickening Unexplained pericardial effusion or thickening Unexplained intrinsic airways disease* (by PFT, imaging or pathology) Unexplained pulmonary vasculopathy

## 2021-11-24 ENCOUNTER — Other Ambulatory Visit: Payer: Self-pay | Admitting: Gastroenterology

## 2021-11-24 DIAGNOSIS — R1031 Right lower quadrant pain: Secondary | ICD-10-CM

## 2021-11-24 DIAGNOSIS — G8929 Other chronic pain: Secondary | ICD-10-CM

## 2021-12-05 ENCOUNTER — Other Ambulatory Visit: Payer: Self-pay

## 2021-12-05 ENCOUNTER — Encounter: Payer: Self-pay | Admitting: Adult Health

## 2021-12-05 ENCOUNTER — Ambulatory Visit (INDEPENDENT_AMBULATORY_CARE_PROVIDER_SITE_OTHER): Payer: Medicare PPO | Admitting: Adult Health

## 2021-12-05 VITALS — BP 122/82 | HR 104 | Temp 97.7°F | Ht 66.0 in | Wt 148.8 lb

## 2021-12-05 DIAGNOSIS — K219 Gastro-esophageal reflux disease without esophagitis: Secondary | ICD-10-CM

## 2021-12-05 DIAGNOSIS — S22080S Wedge compression fracture of T11-T12 vertebra, sequela: Secondary | ICD-10-CM

## 2021-12-05 DIAGNOSIS — N182 Chronic kidney disease, stage 2 (mild): Secondary | ICD-10-CM

## 2021-12-05 DIAGNOSIS — G8929 Other chronic pain: Secondary | ICD-10-CM

## 2021-12-05 DIAGNOSIS — M8580 Other specified disorders of bone density and structure, unspecified site: Secondary | ICD-10-CM

## 2021-12-05 DIAGNOSIS — Z0001 Encounter for general adult medical examination with abnormal findings: Secondary | ICD-10-CM

## 2021-12-05 DIAGNOSIS — E782 Mixed hyperlipidemia: Secondary | ICD-10-CM | POA: Diagnosis not present

## 2021-12-05 DIAGNOSIS — Z79899 Other long term (current) drug therapy: Secondary | ICD-10-CM | POA: Diagnosis not present

## 2021-12-05 DIAGNOSIS — J849 Interstitial pulmonary disease, unspecified: Secondary | ICD-10-CM

## 2021-12-05 DIAGNOSIS — I700 Atherosclerosis of aorta: Secondary | ICD-10-CM | POA: Diagnosis not present

## 2021-12-05 DIAGNOSIS — I10 Essential (primary) hypertension: Secondary | ICD-10-CM | POA: Diagnosis not present

## 2021-12-05 DIAGNOSIS — E210 Primary hyperparathyroidism: Secondary | ICD-10-CM | POA: Diagnosis not present

## 2021-12-05 DIAGNOSIS — Z0000 Encounter for general adult medical examination without abnormal findings: Secondary | ICD-10-CM

## 2021-12-05 DIAGNOSIS — R7309 Other abnormal glucose: Secondary | ICD-10-CM

## 2021-12-05 DIAGNOSIS — Z1329 Encounter for screening for other suspected endocrine disorder: Secondary | ICD-10-CM

## 2021-12-05 DIAGNOSIS — Z136 Encounter for screening for cardiovascular disorders: Secondary | ICD-10-CM

## 2021-12-05 DIAGNOSIS — R9431 Abnormal electrocardiogram [ECG] [EKG]: Secondary | ICD-10-CM

## 2021-12-05 DIAGNOSIS — E559 Vitamin D deficiency, unspecified: Secondary | ICD-10-CM

## 2021-12-05 DIAGNOSIS — Z860100 Personal history of colon polyps, unspecified: Secondary | ICD-10-CM

## 2021-12-05 DIAGNOSIS — J449 Chronic obstructive pulmonary disease, unspecified: Secondary | ICD-10-CM

## 2021-12-05 DIAGNOSIS — Z131 Encounter for screening for diabetes mellitus: Secondary | ICD-10-CM

## 2021-12-05 MED ORDER — PANTOPRAZOLE SODIUM 40 MG PO TBEC: 40.0000 mg | DELAYED_RELEASE_TABLET | Freq: Every day | ORAL | 3 refills | Status: DC

## 2021-12-05 NOTE — Patient Instructions (Addendum)
?Ms. Ebrahim , ?Thank you for taking time to come for your Annual Wellness Visit. I appreciate your ongoing commitment to your health goals. Please review the following plan we discussed and let me know if I can assist you in the future.  ? ?These are the goals we discussed: ? Goals   ? ?  Blood Pressure < 140/90   ?  Exercise 150 min/wk Moderate Activity   ?  LDL CALC < 100   ? ?  ?  ?This is a list of the screening recommended for you and due dates:  ?Health Maintenance  ?Topic Date Due  ? Zoster (Shingles) Vaccine (1 of 2) 03/07/2022*  ? Mammogram  04/30/2022  ? Tetanus Vaccine  05/07/2022  ? Pneumonia Vaccine  Completed  ? Flu Shot  Completed  ? DEXA scan (bone density measurement)  Completed  ? COVID-19 Vaccine  Completed  ? HPV Vaccine  Aged Out  ?*Topic was postponed. The date shown is not the original due date.  ? ? ?Recommend benefiber or other fiber supplement daily -  ?Consider a probiotic (Align or GI stability) ?Miralax as needed to maintain a daily soft BM (drink plenty of water) ? ?Keep a log of food - watch for trigger foods ? ? ? ? ?Diet for Irritable Bowel Syndrome ?When you have irritable bowel syndrome (IBS), it is very important to eat the foods and follow the eating habits that are best for your condition. IBS may cause various symptoms such as pain in the abdomen, constipation, or diarrhea. Choosing the right foods can help to ease the discomfort from these symptoms. Work with your health care provider and diet and nutrition specialist (dietitian) to find the eating plan that will help to control your symptoms. ?What are tips for following this plan? ?  ?Keep a food diary. This will help you identify foods that cause symptoms. Write down: ?What you eat and when you eat it. ?What symptoms you have. ?When symptoms occur in relation to your meals, such as "pain in abdomen 2 hours after dinner." ?Eat your meals slowly and in a relaxed setting. ?Aim to eat 5-6 small meals per day. Do not skip  meals. ?Drink enough fluid to keep your urine pale yellow. ?Ask your health care provider if you should take an over-the-counter probiotic to help restore healthy bacteria in your gut (digestive tract). ?Probiotics are foods that contain good bacteria and yeasts. ?Your dietitian may have specific dietary recommendations for you based on your symptoms. He or she may recommend that you: ?Avoid foods that cause symptoms. Talk with your dietitian about other ways to get the same nutrients that are in those problem foods. ?Avoid foods with gluten. Gluten is a protein that is found in rye, wheat, and barley. ?Eat more foods that contain soluble fiber. Examples of foods with high soluble fiber include oats, seeds, and certain fruits and vegetables. Take a fiber supplement if directed by your dietitian. ?Reduce or avoid certain foods called FODMAPs. These are foods that contain carbohydrates that are hard to digest. Ask your doctor which foods contain these carbohydrates. ?What foods are not recommended? ?The following are some foods and drinks that may make your symptoms worse: ?Fatty foods, such as french fries. ?Foods that contain gluten, such as pasta and cereal. ?Dairy products, such as milk, cheese, and ice cream. ?Chocolate. ?Alcohol. ?Products with caffeine, such as coffee. ?Carbonated drinks, such as soda. ?Foods that are high in FODMAPs. These include certain fruits and vegetables. ?  Products with sweeteners such as honey, high fructose corn syrup, sorbitol, and mannitol. ?The items listed above may not be a complete list of foods and beverages you should avoid. Contact a dietitian for more information. ?What foods are good sources of fiber? ?Your health care provider or dietitian may recommend that you eat more foods that contain fiber. Fiber can help to reduce constipation and other IBS symptoms. Add foods with fiber to your diet a little at a time so your body can get used to them. Too much fiber at one time  might cause gas and swelling of your abdomen. The following are some foods that are good sources of fiber: ?Berries, such as raspberries, strawberries, and blueberries. ?Tomatoes. ?Carrots. ?Brown rice. ?Oats. ?Seeds, such as chia and pumpkin seeds. ?The items listed above may not be a complete list of recommended sources of fiber. Contact your dietitian for more options. ?Where to find more information ?International Foundation for Functional Gastrointestinal Disorders: www.iffgd.org ?[employer] of Diabetes and Digestive and Kidney Diseases: [employer] ?Summary ?When you have irritable bowel syndrome (IBS), it is very important to eat the foods and follow the eating habits that are best for your condition. ?IBS may cause various symptoms such as pain in the abdomen, constipation, or diarrhea. ?Choosing the right foods can help to ease the discomfort that comes from symptoms. ?Keep a food diary. This will help you identify foods that cause symptoms. ?Your health care provider or diet and nutrition specialist (dietitian) may recommend that you eat more foods that contain fiber. ?This information is not intended to replace advice given to you by your health care provider. Make sure you discuss any questions you have with your health care provider. ?Document Revised: 04/27/2020 Document Reviewed: 05/04/2020 ?Elsevier Patient Education ? 2022 [place]. ? ? ? ? ? ? ? ? ?FODMAP stands for fermentable oligo-, di-, mono-saccharides and polyols (1). ?These are the scientific terms used to classify groups of carbs that are notorious for triggering digestive symptoms like bloating, gas and stomach pain.  ? ?FODMAPs are found in a wide range of foods in varying amounts. Some foods contain just one type, while others contain several. ? ?The main dietary sources of the four groups of FODMAPs include:  ?Oligosaccharides: Wheat, rye, legumes and various fruits and vegetables, such as garlic and  onions. ? ?Disaccharides: Milk, yogurt and soft cheese. Lactose is the main carb. ? ?Monosaccharides: Various fruit including figs and mangoes, and sweeteners such as honey and agave nectar. Fructose is the main carb. ? ?Polyols: Certain fruits and vegetables including blackberries and lychee, as well as some low-calorie sweeteners like those in sugar-free gum. ?  ?Keep a food diary. This will help you identify foods that cause symptoms. Write down: ?What you eat and when. ?What symptoms you have. ?When symptoms occur in relation to your meals. ?Avoid foods that cause symptoms. Talk with your dietitian about other ways to get the same nutrients that are in these foods. ?Eat your meals slowly, in a relaxed setting. ?Aim to eat 5-6 small meals per day. Do not skip meals. ?Drink enough fluids to keep your urine clear or pale yellow. ?If dairy products cause your symptoms to flare up, try eating less of them. You might be able to handle yogurt better than other dairy products because it contains bacteria that help with digestion. ?  ? ? ? ?

## 2021-12-05 NOTE — Progress Notes (Signed)
CPE ? ?Assessment:  ? ?Diagnoses and all orders for this visit: ? ?Encounter for Annual Physical Exam with abnormal findings ?Due annually  ?Health Maintenance reviewed ?Healthy lifestyle reviewed and goals set ? ?Essential hypertension ?Continue medication; labile; well controlled at home;  ?Monitor blood pressure at home; call if consistently over 130/80 ?Continue DASH diet.   ?Reminder to go to the ER if any CP, SOB, nausea, dizziness, severe HA, changes vision/speech, left arm numbness and tingling and jaw pain. ? ?Aortic atherosclerosis ([place]) ?Per CT 2020 ?LDL goal <100, continue statin  ?Control blood pressure, cholesterol, glucose, increase exercise.  ? ?ILD (interstitial lung disease) ([place]) ?Followed by Dr. Ashok Cordia ? ?COPD, mild ([place]) ?Followed by Dr. Ashok Cordia ? ?Chronic seasonal allergic rhinitis ?Continue allergy pill ? ?Diverticulosis ?Increase fiber and fluids ? ?Arthritis ?Tylenol PRN ? ?CKD (chronic kidney disease) stage 2 ?Increase fluids, avoid NSAIDS, monitor sugars, will monitor ?CMP/GFR ?UA/microalbumin ? ?Vitamin D deficiency ?Continue supplementation ?Check vitamin D level annually and as needed ? ?Other abnormal glucose ?Discussed diet/exercise, weight management  ?Check A1C annually  ? ?Medication management ?CBC, CMP/GFR, magnesium  ? ?HSV-1 (herpes simplex virus 1) infection ?Antiviral PRN ? ?Dyslipidemia ?Continue rosuvastatin  ?LDL goal 100 ?Continue low cholesterol diet and exercise.  ?Check lipid panel.  ? ?Osteopenia ?- had recent DEXA early 2023 by endocrinology per patient, will request report ?-continue Vit D and Ca, weight bearing exercises ? ?History of colon polyp ?High fiber diet, DONE with colonoscopies per GI ? ?Lumbar back pain/ lumbar spondylosis ?Persistent intermittent, without radicular sx ?Tylenol PRN, heat/ice, back exercises  ? ?Primary Herperparathyroid ([place]) ?Avoid calcium supplemetns; holding vit D now ?Dr. Kelton Pillar following, monitoring only ?Had recent DEXA, report  pending -  ? ?Compression fracture T12 ?Chronic per imaging; manages with tylenol PRN ? ?Gastroesophageal reflux disease, esophagitis presence not specified ?Well controlled, reduce pantoprazole 40 mg BID to once daily, famotidine opposite, taper off PPI if able ?Discussed diet, avoiding triggers and other lifestyle changes ? ?Variable lower abdominal pain/ Constipation/ ? IBS ?Unremarkable workup by GI ?Add soluble fiber supplement, miralax PRN for daily soft stools ?Encouraged food log to help identify any triggers -  ? ? ?Orders Placed This Encounter  ?Procedures  ? CBC with Differential/Platelet  ? COMPLETE METABOLIC PANEL WITH GFR  ? Magnesium  ? Lipid panel  ? TSH  ? Hemoglobin A1c  ? Microalbumin / creatinine urine ratio  ? Urinalysis, Routine w reflex microscopic  ? EKG 12-Lead  ? ? ?Over 40 minutes of exam, counseling, chart review and critical decision making was performed ?Future Appointments  ?Date Time Provider [place]  ?04/09/2022 11:00 AM Darrol Jump, NP [facility] None  ?04/30/2022  9:10 AM Shamleffer, Melanie Crazier, MD [facility] None  ?12/10/2022  3:00 PM Liane Comber, NP [facility] None  ? ? ? ?Plan:  ? ?During the course of the visit the patient was educated and counseled about appropriate screening and preventive services including:  ? ?Pneumococcal vaccine  ?Prevnar 13 ?Influenza vaccine ?Td vaccine ?Screening electrocardiogram ?Bone densitometry screening ?Colorectal cancer screening ?Diabetes screening ?Glaucoma screening ?Nutrition counseling  ?Advanced directives: requested ? ? ?Subjective:  ?Catherine Parks is a 81 y.o. female who presents for CPE and 3 month follow up. She has Essential hypertension; HSV-1 (herpes simplex virus 1) infection; Diverticulosis; History of colon polyps; Vitamin D deficiency; CKD (chronic kidney disease) stage 2, GFR 60-89 ml/min; Medication management; GERD ; COPD, mild ([place]); Arthritis; Chronic seasonal allergic rhinitis; ILD (interstitial  lung disease) ([place]);  Aortic atherosclerosis ([place]); Abnormal EKG; Former smoker; Abnormal glucose (prediabetes); Hyperlipidemia, mixed; Osteopenia; Hypercalcemia; Chronic lumbar pain; T12 compression fracture ([place]); Hiatal hernia; Hypervitaminosis D; and Primary hyperparathyroidism ([place]) on their problem list. ? ?She is widowed, 2 children, 4 grandchildren. She is a retired [employer] account executive.  ? ?Patient has been followed by Dr Chase Caller for COPD/ILD and a 6 mm LLL nodule by CT scans which has remained stable in size since 1st discovered in May 2018.  Follows with pulm annually, getting low dose screening CT due to 58 pack year smoking hx, quit in 2016.  ?  ?She has hx of colon polyps, diverticulosis, last colonoscopy by Dr. Tarri Glenn 06/2021 benign other than diverticuloses; also otherwise benign CT abd 1/242023. She reported having more constipation (intermittent, normal calibur), variable lower abdominal discomfort.  ? ?Patient has hx/o GERD, was recently added pantoprazole 40 mg BID x 12 weeks but no changes in GI sx, also taking famotidine.  ? ?She has intermittent back pain, uses tylenol and manages well. Known chronic T12 compression fracture.  ? ?BMI is Body mass index is 24.02 kg/m?., she has been working on diet, eating a lot of fruit admits limited exercise.  ?Wt Readings from Last 3 Encounters:  ?12/05/21 148 lb 12.8 oz (67.5 kg)  ?10/29/21 151 lb 12.8 oz (68.9 kg)  ?10/16/21 150 lb (68 kg)  ? ?She has aortic atherosclerosis per CT 03/2019 ?Had stress test and ECHO in 12/2017 following chest/jaw pain episode which were unremarkable -  ? ? Her blood pressure has been controlled at home (110-120s/70s), today their BP is BP: 122/82 ?She does not workout. She denies chest pain, shortness of breath, dizziness.  ? ?She is on cholesterol medication (rosuvastatin 5 mg 3 days a week) and denies myalgias. Her cholesterol is not at goal. She is doing plant based, no dairy, low fat poultry. The cholesterol last visit  was:   ?Lab Results  ?Component Value Date  ? CHOL 182 07/25/2021  ? HDL 82 07/25/2021  ? [place] 85 07/25/2021  ? TRIG 66 07/25/2021  ? CHOLHDL 2.2 07/25/2021  ? ? She has been working on diet and exercise for glucose management, and denies foot ulcerations, increased appetite, nausea, paresthesia of the feet, polydipsia, polyuria, visual disturbances, vomiting and weight loss.  ?Last A1C in the office was:  ?Lab Results  ?Component Value Date  ? HGBA1C 5.6 12/05/2020  ? ?Last GFR: ?Lab Results  ?Component Value Date  ? GFRAA 70 03/09/2021  ? ?Patient is on Vitamin D supplement and above goal at recent check, stopped supplement:    ?Lab Results  ?Component Value Date  ? VD25OH 113.14 (HH) 10/29/2021  ?   ?Has had mild elevated calcium for many years, recently with elevated PTH, now following with Dr. Kelton Pillar for primary hyperparathyroid. Had recent DEXA though no results accessible, plan for monitoring at this time.   ?Lab Results  ?Component Value Date  ? PTH 81 (H) 10/29/2021  ? CALCIUM 10.4 10/29/2021  ? ? ? ? ?Medication Review: ?Current Outpatient Medications on File Prior to Visit  ?Medication Sig Dispense Refill  ? acetaminophen (TYLENOL) 500 MG tablet Take 500 mg by mouth every 6 (six) hours as needed (pain).    ? amLODipine (NORVASC) 10 MG tablet TAKE 1 TABLET BY MOUTH EVERY DAY FOR BLOOD PRESSURE 90 tablet 3  ? Ascorbic Acid (VITAMIN C) 100 MG tablet Take 100 mg by mouth daily.    ? azelastine (ASTELIN) 0.1 % nasal spray     ?  famotidine (PEPCID) 40 MG tablet TAKE 1 TABLET DAILY TO PREVENT HEARTBURN & INDIGESTION 90 tablet 3  ? halobetasol (ULTRAVATE) 0.05 % cream Apply topically 2 (two) times daily. Apply to Eczema Rash 2 x/day 100 g 11  ? hydrocortisone 2.5 % cream     ? Magnesium 250 MG TABS Take 1 tablet (250 mg total) by mouth daily. 30 tablet 0  ? Naphazoline HCl (CLEAR EYES OP) Place 1 drop into both eyes daily.    ? nitroGLYCERIN (NITROSTAT) 0.4 MG SL tablet Place 0.4 mg under the tongue every  5 (five) minutes as needed for chest pain.    ? OVER THE COUNTER MEDICATION Takes OTC eczema cream PRN.    ? rosuvastatin (CRESTOR) 5 MG tablet TAKE 1 TAB IN THE EVENING 3 DAYS A WEEK (MWF) FOR CHOLESTE

## 2021-12-06 LAB — MICROALBUMIN / CREATININE URINE RATIO
Creatinine, Urine: 68 mg/dL (ref 20–275)
Microalb Creat Ratio: 7 mcg/mg creat (ref <30)
Microalb, Ur: 0.5 mg/dL

## 2021-12-06 LAB — CBC WITH DIFFERENTIAL/PLATELET
Absolute Monocytes: 520 cells/uL (ref 200–950)
Basophils Absolute: 20 cells/uL (ref 0–200)
Basophils Relative: 0.4 %
Eosinophils Absolute: 200 cells/uL (ref 15–500)
Eosinophils Relative: 4 %
HCT: 38 % (ref 35.0–45.0)
Hemoglobin: 13 g/dL (ref 11.7–15.5)
Lymphs Abs: 1805 cells/uL (ref 850–3900)
MCH: 30 pg (ref 27.0–33.0)
MCHC: 34.2 g/dL (ref 32.0–36.0)
MCV: 87.6 fL (ref 80.0–100.0)
MPV: 9.4 fL (ref 7.5–12.5)
Monocytes Relative: 10.4 %
Neutro Abs: 2455 cells/uL (ref 1500–7800)
Neutrophils Relative %: 49.1 %
Platelets: 355 10*3/uL (ref 140–400)
RBC: 4.34 10*6/uL (ref 3.80–5.10)
RDW: 13.8 % (ref 11.0–15.0)
Total Lymphocyte: 36.1 %
WBC: 5 10*3/uL (ref 3.8–10.8)

## 2021-12-06 LAB — COMPLETE METABOLIC PANEL WITH GFR
AG Ratio: 1.2 (calc) (ref 1.0–2.5)
ALT: 13 U/L (ref 6–29)
AST: 14 U/L (ref 10–35)
Albumin: 4.1 g/dL (ref 3.6–5.1)
Alkaline phosphatase (APISO): 69 U/L (ref 37–153)
BUN: 19 mg/dL (ref 7–25)
CO2: 28 mmol/L (ref 20–32)
Calcium: 10.4 mg/dL (ref 8.6–10.4)
Chloride: 105 mmol/L (ref 98–110)
Creat: 0.94 mg/dL (ref 0.60–0.95)
Globulin: 3.3 g/dL (calc) (ref 1.9–3.7)
Glucose, Bld: 84 mg/dL (ref 65–99)
Potassium: 3.8 mmol/L (ref 3.5–5.3)
Sodium: 141 mmol/L (ref 135–146)
Total Bilirubin: 0.4 mg/dL (ref 0.2–1.2)
Total Protein: 7.4 g/dL (ref 6.1–8.1)
eGFR: 61 mL/min/{1.73_m2} (ref >=60)

## 2021-12-06 LAB — URINALYSIS, ROUTINE W REFLEX MICROSCOPIC
Bilirubin Urine: NEGATIVE
Glucose, UA: NEGATIVE
Hgb urine dipstick: NEGATIVE
Ketones, ur: NEGATIVE
Leukocytes,Ua: NEGATIVE
Nitrite: NEGATIVE
Protein, ur: NEGATIVE
Specific Gravity, Urine: 1.014 (ref 1.001–1.035)
pH: 6.5 (ref 5.0–8.0)

## 2021-12-06 LAB — MAGNESIUM: Magnesium: 2.2 mg/dL (ref 1.5–2.5)

## 2021-12-06 LAB — LIPID PANEL
Cholesterol: 179 mg/dL (ref <200)
HDL: 74 mg/dL (ref >=50)
LDL Cholesterol (Calc): 91 mg/dL (calc)
Non-HDL Cholesterol (Calc): 105 mg/dL (calc) (ref <130)
Total CHOL/HDL Ratio: 2.4 (calc) (ref <5.0)
Triglycerides: 62 mg/dL (ref <150)

## 2021-12-06 LAB — TSH: TSH: 1.22 mIU/L (ref 0.40–4.50)

## 2021-12-06 LAB — HEMOGLOBIN A1C
Hgb A1c MFr Bld: 5.7 % of total Hgb — ABNORMAL HIGH (ref <5.7)
Mean Plasma Glucose: 117 mg/dL
eAG (mmol/L): 6.5 mmol/L

## 2022-03-11 ENCOUNTER — Ambulatory Visit: Payer: Medicare PPO | Admitting: Adult Health

## 2022-04-09 ENCOUNTER — Ambulatory Visit: Payer: Medicare PPO | Admitting: Nurse Practitioner

## 2022-04-09 VITALS — BP 118/70 | HR 64 | Temp 97.7°F | Ht 66.0 in | Wt 145.0 lb

## 2022-04-09 DIAGNOSIS — K5790 Diverticulosis of intestine, part unspecified, without perforation or abscess without bleeding: Secondary | ICD-10-CM | POA: Diagnosis not present

## 2022-04-09 DIAGNOSIS — M898X1 Other specified disorders of bone, shoulder: Secondary | ICD-10-CM

## 2022-04-09 DIAGNOSIS — M1990 Unspecified osteoarthritis, unspecified site: Secondary | ICD-10-CM

## 2022-04-09 DIAGNOSIS — J449 Chronic obstructive pulmonary disease, unspecified: Secondary | ICD-10-CM

## 2022-04-09 DIAGNOSIS — R6889 Other general symptoms and signs: Secondary | ICD-10-CM

## 2022-04-09 DIAGNOSIS — Z0000 Encounter for general adult medical examination without abnormal findings: Secondary | ICD-10-CM

## 2022-04-09 DIAGNOSIS — J849 Interstitial pulmonary disease, unspecified: Secondary | ICD-10-CM

## 2022-04-09 DIAGNOSIS — E559 Vitamin D deficiency, unspecified: Secondary | ICD-10-CM

## 2022-04-09 DIAGNOSIS — I10 Essential (primary) hypertension: Secondary | ICD-10-CM

## 2022-04-09 DIAGNOSIS — Z79899 Other long term (current) drug therapy: Secondary | ICD-10-CM

## 2022-04-09 DIAGNOSIS — J302 Other seasonal allergic rhinitis: Secondary | ICD-10-CM

## 2022-04-09 DIAGNOSIS — I700 Atherosclerosis of aorta: Secondary | ICD-10-CM | POA: Diagnosis not present

## 2022-04-09 DIAGNOSIS — N182 Chronic kidney disease, stage 2 (mild): Secondary | ICD-10-CM

## 2022-04-09 DIAGNOSIS — E785 Hyperlipidemia, unspecified: Secondary | ICD-10-CM | POA: Diagnosis not present

## 2022-04-09 DIAGNOSIS — E782 Mixed hyperlipidemia: Secondary | ICD-10-CM

## 2022-04-09 DIAGNOSIS — Z0001 Encounter for general adult medical examination with abnormal findings: Secondary | ICD-10-CM

## 2022-04-09 DIAGNOSIS — B009 Herpesviral infection, unspecified: Secondary | ICD-10-CM

## 2022-04-09 DIAGNOSIS — K219 Gastro-esophageal reflux disease without esophagitis: Secondary | ICD-10-CM

## 2022-04-09 DIAGNOSIS — R7309 Other abnormal glucose: Secondary | ICD-10-CM

## 2022-04-09 DIAGNOSIS — M8580 Other specified disorders of bone density and structure, unspecified site: Secondary | ICD-10-CM

## 2022-04-09 NOTE — Progress Notes (Signed)
MEDICARE AND FOLLOW UP  Assessment:   Diagnoses and all orders for this visit:  Annual Medicare Wellness Visit Due annually   Essential hypertension Controlled  Continue medications;  Discussed DASH (Dietary Approaches to Stop Hypertension) DASH diet is lower in sodium than a typical American diet. Cut back on foods that are high in saturated fat, cholesterol, and trans fats. Eat more whole-grain foods, fish, poultry, and nuts Remain active and exercise as tolerated daily.  Monitor BP at home-Call if greater than 130/80.  Check CBC/CMP   Aortic atherosclerosis (HCC) Controlled Continue medications;  Discussed lifestyle modifications. Recommended diet heavy in fruits and veggies, omega 3's. Decrease consumption of animal meats, cheeses, and dairy products. Remain active and exercise as tolerated. Continue to monitor. Check lipids   ILD (interstitial lung disease) ([place]) Followed by Pulmonology once yearly  She is due to see in 04/2022 No recent flares or complications. Continue to monitor   COPD, mild ([place]) Followed by Pulmonology once yearly  She is due to see in 04/2022 No recent flares or complications. Continue to monitor  Chronic seasonal allergic rhinitis Controlled. No recent flare. Antihistamine OTC PRN Avoid triggers  Bronchiectasis without acute exacerbation ([place]) Followed by Pulmonology once yearly  She is due to see in 04/2022 No recent flares or complications. Continue to monitor  Gastroesophageal reflux disease, esophagitis presence not specified No suspected reflux complications ([place]/stricture). Lifestyle modification:  wt loss, avoid meals 2-3h before bedtime. Consider eliminating food triggers:  chocolate, caffeine, EtOH, acid/spicy food.  Completed colonoscopy Continue to monitor  Diverticulosis Increase fiber and fluids Avoid greasy and fatty foods when flared.  Arthritis Tylenol PRN Can take up to 3000 mg in 24 hours. Rest  when flared.   CKD (chronic kidney disease) stage 3, GFR 30-59 ml/min (HCC) Discussed how what you eat and drink can aide in kidney protection. Stay well hydrated. Avoid high salt foods. Avoid NSAIDS. Keep BP and BG well controlled.   Take medications as prescribed. Remain active and exercise as tolerated daily. Maintain weight.  Continue to monitor. Check GFR/CMP  Vitamin D deficiency Continue supplementation Check vitamin D level Dexa scan ordered.  Other abnormal glucose Education: Reviewed 'ABCs' of diabetes management  A1C (<7):  Goal Met. Blood pressure (<130/80):  Goal Met Cholesterol (LDL <70):  Goal Met Continue Eye Exam yearly  Continue Dental Exam Q6 mo Discussed dietary recommendations Check A1C  Medication management All medications discussed and reviewed in full. All questions and concerns regarding medications addressed.     HSV-1 (herpes simplex virus 1) infection Antiviral PRN  Dyslipidemia Controlled Continue medications;  Discussed lifestyle modifications. Recommended diet heavy in fruits and veggies, omega 3's. Decrease consumption of animal meats, cheeses, and dairy products. Remain active and exercise as tolerated. Continue to monitor.   Osteopenia Pursue a combination of weight-bearing exercises and strength training. Advised on fall prevention measures including proper lighting in all rooms, removal of area rugs and floor clutter, use of walking devices as deemed appropriate, avoidance of uneven walking surfaces. Consume 800 to 1000 IU of vitamin D daily with a goal vitamin D serum value of 30 ng/mL or higher. Aim for 1000 to 1200 mg of elemental calcium daily through supplements and/or dietary sources. Complete Dexa.   Abdominal pain/ Polyp of colon, unspecified part of colon, unspecified type Suggest fiber supplement, increase water, increase exercise  Pain of left Clavicle. Increase Tylenol dosage. Can take up to 3,000 mg in 24  hours. Rest when flared. Continue to monitor  Orders Placed This Encounter  Procedures   DG Bone Density    Standing Status:   Future    Standing Expiration Date:   04/10/2023    Scheduling Instructions:     Patient requesting [facility]    Order Specific Question:   Reason for Exam (SYMPTOM  OR DIAGNOSIS REQUIRED)    Answer:   screening for osteoporosis    Order Specific Question:   Preferred imaging location?    Answer:   External   CBC with Differential/Platelet   COMPLETE METABOLIC PANEL WITH GFR   Lipid panel   Hemoglobin A1c   VITAMIN D 25 Hydroxy (Vit-D Deficiency, Fractures)    Over 40 minutes of exam, counseling, chart review and critical decision making was performed Future Appointments  Date Time Provider [place]  04/30/2022  9:10 AM Shamleffer, Melanie Crazier, MD [facility] None  12/10/2022  3:00 PM Darrol Jump, NP [facility] None  03/13/2023  3:00 PM Darrol Jump, NP [facility] None     Plan:   During the course of the visit the patient was educated and counseled about appropriate screening and preventive services including:   Pneumococcal vaccine  Prevnar 13 Influenza vaccine Td vaccine Screening electrocardiogram Bone densitometry screening Colorectal cancer screening Diabetes screening Glaucoma screening Nutrition counseling  Advanced directives: requested   Subjective:  Catherine Parks is a 81 y.o. female who presents for AWV and 3 month follow up. She has Essential hypertension; HSV-1 (herpes simplex virus 1) infection; Diverticulosis; History of colon polyps; Vitamin D deficiency; CKD (chronic kidney disease) stage 2, GFR 60-89 ml/min; Medication management; GERD ; COPD, mild ([place]); Arthritis; Chronic seasonal allergic rhinitis; ILD (interstitial lung disease) ([place]); Aortic atherosclerosis ([place]); Abnormal EKG; Former smoker; Abnormal glucose (prediabetes); Hyperlipidemia, mixed; Osteopenia; Hypercalcemia; Chronic lumbar pain; T12  compression fracture ([place]); Hiatal hernia; Hypervitaminosis D; and Primary hyperparathyroidism ([place]) on their problem list.  Overall she reports feeling well.  She complains of intermittent left clavicle pain.  No know recent injury or fall.  Has tried 500 mg Tylenol with some improvement.  Has DEXA due this year.  Hx of osteopenia.    Patient has been followed by Dr Chase Caller for COPD/ILD and a 6 mm LLL nodule by CT scans which has remained stable in size since 1st discovered in May 2018.  Follows with pulm annually, getting low dose screening CT due to 58 pack year smoking hx, quit in 2016. Some emphysematous changes, ILD. ECHO 06/2020 was benign without evidence of PAH.    Patient has hx/o GERD stable by lifestyle & famotidine.  She has hx of colon polyps, diverticulosis, last colonoscopy ; 06/2021 with no recall.  She reported having more constipation (intermittent, normal calibur), lower abdominal discomfort, she was advised fiber, miralax, fluid intake (reports improved with fiber) and she was referred to GI Dr. Tarri Glenn for routine colonoscopy but hasn't heard back to schedule. She thinks she now has the occasional diverticulitis flare.  She walks around to keep bowels active and takes tylenol to ease the pain.  Most days this helps.  She has lumbar pain with moderate spondylosis on xray 11/2020, manging well with PRN tylenol.   BMI is Body mass index is 23.4 kg/m., she has been working on diet, not exercising since covid 19 due to pulm conditions and restrictions, [facility] Too hot  Wt Readings from Last 3 Encounters:  04/09/22 145 lb (65.8 kg)  12/05/21 148 lb 12.8 oz (67.5 kg)  10/29/21 151 lb 12.8 oz (68.9 kg)   She has aortic atherosclerosis per CT 03/2019 Had stress test and ECHO in 12/2017 following chest/jaw pain episode which were unremarkable -    Her blood pressure has been controlled at home (110-120s/70s), today their BP is BP: 118/70,  will take clonidine 0.2 mg PRN, typically once a week for labile BPs She does not workout. She denies chest pain, shortness of breath, dizziness.   She is on cholesterol medication (rosuvastatin 5 mg 3 days a week) and denies myalgias. Her cholesterol is not at goal. She is doing plant based, no dairy, low fat poultry. The cholesterol last visit was:   Lab Results  Component Value Date   CHOL 179 12/05/2021   HDL 74 12/05/2021   LDLCALC 91 12/05/2021   TRIG 62 12/05/2021   CHOLHDL 2.4 12/05/2021    She has been working on diet and exercise for glucose management, and denies foot ulcerations, increased appetite, nausea, paresthesia of the feet, polydipsia, polyuria, visual disturbances, vomiting and weight loss.  Last A1C in the office was:  Lab Results  Component Value Date   HGBA1C 5.7 (H) 12/05/2021   Last GFR: Lab Results  Component Value Date   GFRAA 70 03/09/2021   Patient is on Vitamin D supplement and at goal at recent check:    Lab Results  Component Value Date   VD25OH 113.14 ([place]) 10/29/2021     Has had elevated calcium, denies supplement or tums.  Lab Results  Component Value Date   PTH 81 (H) 10/29/2021   CALCIUM 10.4 12/05/2021      Medication Review: Current Outpatient Medications on File Prior to Visit  Medication Sig Dispense Refill   acetaminophen (TYLENOL) 500 MG tablet Take 500 mg by mouth every 6 (six) hours as needed (pain).     amLODipine (NORVASC) 10 MG tablet TAKE 1 TABLET BY MOUTH EVERY DAY FOR BLOOD PRESSURE 90 tablet 3   Ascorbic Acid (VITAMIN C) 100 MG tablet Take 100 mg by mouth daily.     azelastine (ASTELIN) 0.1 % nasal spray      famotidine (PEPCID) 40 MG tablet TAKE 1 TABLET DAILY TO PREVENT HEARTBURN & INDIGESTION 90 tablet 3   halobetasol (ULTRAVATE) 0.05 % cream Apply topically 2 (two) times daily. Apply to Eczema Rash 2 x/day 100 g 11   hydrocortisone 2.5 % cream      Magnesium 250 MG TABS Take 1 tablet (250 mg total) by mouth daily.  30 tablet 0   Naphazoline HCl (CLEAR EYES OP) Place 1 drop into both eyes daily.     OVER THE COUNTER MEDICATION Takes OTC eczema cream PRN.     pantoprazole (PROTONIX) 40 MG tablet Take 1 tablet (40 mg total) by mouth daily. 90 tablet 3   rosuvastatin (CRESTOR) 5 MG tablet TAKE 1 TAB IN THE EVENING 3 DAYS A WEEK (MWF) FOR CHOLESTEROL TO REDUCE HEART ATTACK/STROKE RISK. (Patient taking differently: TAKE 1 TAB IN THE EVENING 5 DAYS A WEEK FOR CHOLESTEROL TO REDUCE HEART ATTACK/STROKE RISH) 36 tablet 2   triamterene-hydrochlorothiazide (MAXZIDE-25) 37.5-25 MG tablet TAKE 1 TABLET DAILY FOR BP & FLUID 90 tablet 3   Wheat Dextrin (BENEFIBER PO) Take 1 Dose by mouth daily.     nitroGLYCERIN (NITROSTAT) 0.4 MG SL tablet Place 0.4 mg under the tongue every 5 (five) minutes as needed for chest pain. (Patient not taking: Reported on 04/09/2022)     No current facility-administered medications on file prior to  visit.    Allergies  Allergen Reactions   Ace Inhibitors Cough   Lemon Oil Hives    Reaction to lemons   Natural Vegetable Orange [Psyllium]     oranges   Shellfish Allergy Swelling and Hives    Current Problems (verified) Patient Active Problem List   Diagnosis Date Noted   Hypervitaminosis D 10/31/2021   Primary hyperparathyroidism ([place]) 10/31/2021   Hiatal hernia 10/16/2021   T12 compression fracture ([place]) 10/12/2021   Chronic lumbar pain 12/07/2020   Hypercalcemia 12/06/2020   Osteopenia 12/05/2020   Former smoker 05/08/2018   Abnormal glucose (prediabetes) 05/08/2018   Hyperlipidemia, mixed 05/08/2018   Abnormal EKG 12/22/2017   Aortic atherosclerosis ([place]) 07/10/2017   ILD (interstitial lung disease) ([place]) 09/27/2016   Arthritis 08/02/2016   Chronic seasonal allergic rhinitis 08/02/2016   COPD, mild ([place]) 06/07/2016   GERD  01/05/2015   CKD (chronic kidney disease) stage 2, GFR 60-89 ml/min 06/08/2014   Medication management 06/08/2014   Vitamin D deficiency 08/19/2013    Essential hypertension    HSV-1 (herpes simplex virus 1) infection    Diverticulosis    History of colon polyps     Screening Tests Immunization History  Administered Date(s) Administered   DTaP 07/30/2011   Fluad Quad(high Dose 65+) 08/25/2020   Influenza, High Dose Seasonal PF 06/07/2016, 06/25/2017, 09/22/2018, 07/19/2019, 07/25/2021   PFIZER(Purple Top)SARS-COV-2 Vaccination 10/05/2019, 10/25/2019, 08/25/2020   Pfizer Covid-19 Vaccine Bivalent Booster 43yr & up 07/21/2021   Pneumococcal Conjugate-13 11/22/2015   Pneumococcal Polysaccharide-23 05/11/2013   Pneumococcal-Unspecified 01/07/2002   Td 01/07/2002, 05/07/2012   Preventative care: Last colonoscopy: 06/2021 No surveillance colonoscopy recommended given her age M73 04/2014/2022 Negative Due 04/2022 - she has scheduled. DEXA: 04/2019 L fem T-1.3, 04/2019 L fem T -1.4, ordered to schedule [facility]  CT lung 04/2020 Former smoker quit 2016, 50+ pack year hx - pulm is following  Prior vaccinations: TD or Tdap: 2013  Influenza: 08/2021 Pneumococcal: 2003, 2014 Prevnar13: 2017 Shingles/Zostavax: declines Covid 19: 3/3, 2021, pfizer + booster  Names of Other Physician/Practitioners you currently use: 1. [facility] here for primary care 2. [facility], eye doctor, last visit 2021, looking for new provider 3. Dr. SMichell Heinrich dentist, last visit 2022, [id number] Care Team: McUnk PintoMD as PCP - General (Internal Medicine) GrWarden FillersMD as Consulting Physician (Optometry) Croitoru, MiDani GobbleMD as Consulting Physician (Cardiology) PaSable FeilMD as Consulting Physician (Gastroenterology) LeMickle PlumbMD (Gynecology) LuDruscilla BrownieMD as Consulting Physician (Dermatology) EnVolanda NapoleonMD as Consulting Physician (Oncology) NeRozetta NunneryMD (Inactive) as Consulting Physician (Otolaryngology)  SURGICAL HISTORY She  has a past surgical  history that includes Eye surgery; Abdominal hysterectomy; Colonoscopy; Direct laryngoscopy (N/A, 10/21/2014); Mass excision (Right, 10/21/2014); Excision nasal mass (Right, 10/21/2014); and Cesarean section (1969). FAMILY HISTORY Her family history includes Breast cancer in her maternal grandmother, sister, and sister; Cancer in her brother and brother; Heart attack in her father; Hypertension in her mother; Lung cancer (age of onset: 8263in her sister; Other in her brother; Prostate cancer in her brother; Stroke in her mother. SOCIAL HISTORY She  reports that she quit smoking about 7 years ago. Her smoking use included cigarettes. She started smoking about 65 years ago. She has a 58.00 pack-year smoking history. She has never used smokeless tobacco. She reports that she does not drink alcohol and does not use drugs.  MEDICARE WELLNESS OBJECTIVES: Physical activity: Current Exercise Habits: Home  exercise routine, Exercise limited by: cardiac condition(s);orthopedic condition(s);respiratory conditions(s) Cardiac risk factors:   Depression/mood screen:      04/09/2022    1:06 PM  Depression screen PHQ 2/9  Decreased Interest 0  Down, Depressed, Hopeless 0  PHQ - 2 Score 0    ADLs:     04/09/2022    1:05 PM  In your present state of health, do you have any difficulty performing the following activities:  Hearing? 0  Vision? 0  Difficulty concentrating or making decisions? 0  Walking or climbing stairs? 0  Dressing or bathing? 0  Doing errands, shopping? 0  Preparing Food and eating ? N  Using the Toilet? N  In the past six months, have you accidently leaked urine? N  Do you have problems with loss of bowel control? N  Managing your Medications? N  Managing your Finances? N  Housekeeping or managing your Housekeeping? N     Cognitive Testing  Alert? Yes  Normal Appearance?Yes  Oriented to person? Yes  Place? Yes   Time? Yes  Recall of three objects?  Yes  Can perform simple  calculations? Yes  Displays appropriate judgment?Yes  Can read the correct time from a watch face?Yes  EOL planning: Does Patient Have a Medical Advance Directive?: No Would patient like information on creating a medical advance directive?: No - Patient declined     Review of Systems  Constitutional:  Negative for malaise/fatigue and weight loss.  HENT:  Negative for hearing loss and tinnitus.   Eyes:  Negative for blurred vision and double vision.  Respiratory:  Negative for cough, sputum production, shortness of breath and wheezing.   Cardiovascular:  Negative for chest pain, palpitations, orthopnea, claudication, leg swelling and PND.  Gastrointestinal:  Positive for abdominal pain (mild lower abdominal discomfort, intermittent) and blood in stool (single episode last week ). Negative for constipation (intermittent, very hard stools), diarrhea, heartburn, melena, nausea and vomiting.  Genitourinary: Negative.   Musculoskeletal:  Negative for falls, joint pain and myalgias.  Skin:  Negative for rash.  Neurological:  Negative for dizziness, tingling, sensory change, weakness and headaches.  Endo/Heme/Allergies:  Negative for polydipsia.  Psychiatric/Behavioral: Negative.  Negative for depression, memory loss, substance abuse and suicidal ideas. The patient is not nervous/anxious and does not have insomnia.   All other systems reviewed and are negative.    Objective:     Today's Vitals   04/09/22 1100  BP: 118/70  Pulse: 64  Temp: 97.7 F (36.5 C)  SpO2: 98%  Weight: 145 lb (65.8 kg)  Height: '5\' 6"'  (1.676 m)   Body mass index is 23.4 kg/m.  General appearance: alert, no distress, WD/WN, female HEENT: normocephalic, sclerae anicteric, TMs pearly, nares patent, no discharge or erythema, pharynx normal Oral cavity: MMM, no lesions Neck: supple, no lymphadenopathy, no thyromegaly, no masses Heart: RRR, normal S1, S2, no murmurs Lungs: CTA bilaterally, no wheezes, rhonchi,  or rales Abdomen: +bs, soft, non tender, non distended, no masses, no hepatomegaly, no splenomegaly Musculoskeletal: nontender, no swelling, no obvious deformity. Intact lumbar ROM without pain, neg straight leg raise, non-tender. L hip with mildly decreased external rotation without pain or crepitus.  Extremities: no edema, no cyanosis, no clubbing Pulses: 2+ symmetric, upper and lower extremities, normal cap refill Neurological: alert, oriented x 3, CN2-12 intact, strength normal upper extremities and lower extremities, sensation normal throughout, DTRs 2+ throughout, no cerebellar signs, gait normal Psychiatric: normal affect, behavior normal, pleasant    Medicare Attestation I  have personally reviewed: The patient's medical and social history Their use of alcohol, tobacco or illicit drugs Their current medications and supplements The patient's functional ability including ADLs,fall risks, home safety risks, cognitive, and hearing and visual impairment Diet and physical activities Evidence for depression or mood disorders  The patient's weight, height, BMI, and visual acuity have been recorded in the chart.  I have made referrals, counseling, and provided education to the patient based on review of the above and I have provided the patient with a written personalized care plan for preventive services.     Darrol Jump, NP   04/09/2022

## 2022-04-09 NOTE — Patient Instructions (Signed)
Clavicular Osteolysis  Clavicular osteolysis, often called weightlifter's shoulder, is a type of shoulder injury. It happens in the area where the collarbone (clavicle) attaches to the top of the shoulder blade (acromioclavicular joint, or AC joint). This condition results from repeated stress on the [facility] joint. It causes shoulder pain, especially when you lift an object over your head or do other activities that put weight on the shoulders, such as weight lifting, bench presses, or push-ups. Rest may relieve the pain. What are the causes? This condition is caused by repeated stress on the [facility] joint. The repeated stress may cause tiny breaks (microfractures) at the end of the clavicle. Repeated microfractures and incomplete healing lead to a loss of bone in the end of the clavicle. What increases the risk? This condition is more likely to develop in: Men. Weight lifters or other athletes who repeatedly put weight on their shoulders or raise their arms over their head during sport activities. Athletes who participate in sports that involve: Over-the-head arm movements, such as volleyball, swimming, and tennis. Repetitive throwing, such as baseball and football. People who have had previous shoulder injuries. What are the signs or symptoms? Symptoms of this condition include: Shoulder pain that is worse when raising your arms or lifting objects over your head. Tenderness at the end of the collarbone or AC joint. Pain or tenderness when doing movements that involve your arm pulling across your body. Symptoms may develop in one shoulder or both shoulders. How is this diagnosed? This condition may be diagnosed based on: A history of repetitive stress to the [facility] joint, especially from weight lifting. A physical exam of the [facility] joint. During the exam, your health care provider will check to see which arm movements cause pain. Injecting a numbing medicine into the [facility] joint to see if the pain goes  away. Imaging tests, such as X-rays, a bone scan, or an MRI. How is this treated? Treatment for this condition may include: Resting the [facility] joint and avoiding activities that cause pain. Making changes to your training and sports regimens. Taking medicines to lessen pain and swelling (such as anti-inflammatory medicines). Applying ice or heat. Getting injections of medicine into the [facility] joint to reduce inflammation (cortisone injections). In some cases, surgery to remove the tip of the collarbone may be needed if other treatments do not help. Follow these instructions at home:     Take over-the-counter and prescription medicines only as told by your health care provider. Ask your health care provider if the medicine prescribed to you requires you to avoid driving or using heavy machinery. If directed, put ice on the injured area. Put ice in a plastic bag. Place a towel between your skin and the bag. Leave the ice on for 20 minutes, 2-3 times a day. If directed, apply heat to the affected area before you exercise. Use the heat source that your health care provider recommends, such as a moist heat pack or a heating pad. Place a towel between your skin and the heat source. Leave the heat on for 20-30 minutes. Remove the heat if your skin turns bright red. This is especially important if you are unable to feel pain, heat, or cold. You may have a greater risk of getting burned. Avoid activities that cause pain in the injured area. Return to your normal activities as told by your health care provider. Ask your health care provider what activities are safe for you. Keep all follow-up visits as told by your health  care provider. This is important. How is this prevented? Maintain physical fitness, especially strength and flexibility of your shoulder. Talk to your health care provider or physical therapist about which activities are right for you. Limit how often you do weight lifting and how much  weight you lift. Vary the intensity and duration of your weight lifting routine. Use proper techniques when weight lifting. Also, use proper techniques for sport actions such as falling or tackling. Avoid activities that cause high impacts or pain to the shoulder. Use protective equipment that fits properly. Apply tape or pads to your shoulder joints before practice or competition as told by your health care provider. Contact a health care provider if: Your symptoms do not improve with treatment. You have pain in your arm. Your arm is numb or cold. Get help right away if: Your fingernails turn blue or gray. You cannot move your arm at all or you notice a deformity of the arm. Summary Clavicular osteolysis is a type of shoulder injury that is caused by repeated stress on the [facility] joint. It can cause pain and limited range of motion in the shoulder area. Treatment may include resting the AC joint, avoiding activities that cause pain, and taking medicines for pain and inflammation. If directed, put ice on the injured area. Return to your normal activities as told by your health care provider. Ask your health care provider what activities are safe for you. This information is not intended to replace advice given to you by your health care provider. Make sure you discuss any questions you have with your health care provider. Document Revised: 04/26/2021 Document Reviewed: 04/26/2021 Elsevier Patient Education  [place].

## 2022-04-10 LAB — COMPLETE METABOLIC PANEL WITH GFR
AG Ratio: 1.3 (calc) (ref 1.0–2.5)
ALT: 17 U/L (ref 6–29)
AST: 18 U/L (ref 10–35)
Albumin: 4.2 g/dL (ref 3.6–5.1)
Alkaline phosphatase (APISO): 65 U/L (ref 37–153)
BUN: 15 mg/dL (ref 7–25)
CO2: 28 mmol/L (ref 20–32)
Calcium: 10.5 mg/dL — ABNORMAL HIGH (ref 8.6–10.4)
Chloride: 104 mmol/L (ref 98–110)
Creat: 0.92 mg/dL (ref 0.60–0.95)
Globulin: 3.2 g/dL (calc) (ref 1.9–3.7)
Glucose, Bld: 93 mg/dL (ref 65–99)
Potassium: 3.7 mmol/L (ref 3.5–5.3)
Sodium: 142 mmol/L (ref 135–146)
Total Bilirubin: 0.4 mg/dL (ref 0.2–1.2)
Total Protein: 7.4 g/dL (ref 6.1–8.1)
eGFR: 63 mL/min/{1.73_m2} (ref >=60)

## 2022-04-10 LAB — LIPID PANEL
Cholesterol: 164 mg/dL (ref <200)
HDL: 72 mg/dL (ref >=50)
LDL Cholesterol (Calc): 75 mg/dL (calc)
Non-HDL Cholesterol (Calc): 92 mg/dL (calc) (ref <130)
Total CHOL/HDL Ratio: 2.3 (calc) (ref <5.0)
Triglycerides: 83 mg/dL (ref <150)

## 2022-04-10 LAB — CBC WITH DIFFERENTIAL/PLATELET
Absolute Monocytes: 467 cells/uL (ref 200–950)
Basophils Absolute: 21 cells/uL (ref 0–200)
Basophils Relative: 0.5 %
Eosinophils Absolute: 193 cells/uL (ref 15–500)
Eosinophils Relative: 4.7 %
HCT: 38.9 % (ref 35.0–45.0)
Hemoglobin: 13.1 g/dL (ref 11.7–15.5)
Lymphs Abs: 1226 cells/uL (ref 850–3900)
MCH: 29.8 pg (ref 27.0–33.0)
MCHC: 33.7 g/dL (ref 32.0–36.0)
MCV: 88.4 fL (ref 80.0–100.0)
MPV: 9.2 fL (ref 7.5–12.5)
Monocytes Relative: 11.4 %
Neutro Abs: 2194 cells/uL (ref 1500–7800)
Neutrophils Relative %: 53.5 %
Platelets: 334 10*3/uL (ref 140–400)
RBC: 4.4 10*6/uL (ref 3.80–5.10)
RDW: 13.8 % (ref 11.0–15.0)
Total Lymphocyte: 29.9 %
WBC: 4.1 10*3/uL (ref 3.8–10.8)

## 2022-04-10 LAB — HEMOGLOBIN A1C
Hgb A1c MFr Bld: 5.7 % of total Hgb — ABNORMAL HIGH (ref <5.7)
Mean Plasma Glucose: 117 mg/dL
eAG (mmol/L): 6.5 mmol/L

## 2022-04-10 LAB — VITAMIN D 25 HYDROXY (VIT D DEFICIENCY, FRACTURES): Vit D, 25-Hydroxy: 54 ng/mL (ref 30–100)

## 2022-04-29 ENCOUNTER — Other Ambulatory Visit: Payer: Self-pay

## 2022-04-29 ENCOUNTER — Telehealth: Payer: Self-pay | Admitting: Nurse Practitioner

## 2022-04-29 DIAGNOSIS — I10 Essential (primary) hypertension: Secondary | ICD-10-CM

## 2022-04-29 MED ORDER — AMLODIPINE BESYLATE 10 MG PO TABS: ORAL_TABLET | ORAL | 3 refills | Status: DC

## 2022-04-29 MED ORDER — TRIAMTERENE-HCTZ 37.5-25 MG PO TABS: ORAL_TABLET | ORAL | 3 refills | Status: DC

## 2022-04-29 NOTE — Telephone Encounter (Signed)
Patient Is requesting refills on her Triamterene and Amlodipine to [facility] [address]

## 2022-04-29 NOTE — Telephone Encounter (Signed)
Refills have been sent in. 

## 2022-04-30 ENCOUNTER — Ambulatory Visit: Payer: Medicare PPO | Admitting: Internal Medicine

## 2022-04-30 ENCOUNTER — Encounter: Payer: Self-pay | Admitting: Internal Medicine

## 2022-04-30 VITALS — BP 140/80 | HR 66 | Ht 66.0 in | Wt 143.0 lb

## 2022-04-30 DIAGNOSIS — E673 Hypervitaminosis D: Secondary | ICD-10-CM

## 2022-04-30 DIAGNOSIS — E210 Primary hyperparathyroidism: Secondary | ICD-10-CM

## 2022-04-30 NOTE — Progress Notes (Signed)
Name: Catherine Parks  MRN/ DOB: [id number], May 18, 1941    Age/ Sex: 81 y.o., female    PCP: Unk Pinto, MD   Reason for Endocrinology Evaluation: Hypercalcemia      Date of Initial Endocrinology Evaluation: 10/29/2021    HPI: Catherine Parks is a 81 y.o. female with a past medical history of HTN , low bone density and dyslipidemia . The patient presented for initial endocrinology clinic visit on 10/29/2021 for consultative assistance with her Hypercalcemia .   Catherine Parks indicates that she was first diagnosed with hypercalcemia in  her 49's. In review of her chart she has been noted with hypercalcemia in 2015, with a max level of 11.1 mg/dL (uncorrected)  in 09/2021 with inappropriately normal PTH  at 59 pg/mL in 12/2020. Pr  She denies  use of over the counter calcium (including supplements, Tums, Rolaids, or other calcium containing antacids),or  lithium.  She is on  HCTZ, and vitamin D 7000 iu supplement.  She denies  history of kidney stones, kidney disease, liver disease, granulomatous disease. She denies  osteoporosis or prior fractures.  She denies  family history of osteoporosis, parathyroid disease, thyroid disease.    On her initial visit to our clinic she had an elevated vitamin D at 113 NG/mL, which was discontinued with repeat levels normalizing at 54 NG/mL in July 2023   Her 24-hour urinary calcium excretion was normal at 48 mg/DL in June 2022  DXA in 2020 showed low bone density with a T score of -1.4 at the left total hip Repeat DXA in 10/2021 was normal   SUBJECTIVE:    Today (04/30/22):  Catherine Parks is here for follow-up on hypercalcemia and hypervitaminosis D.  In reviewing her records the patient has been noted with a mild compression fracture through the superior endplate of X32, favor chronic on CT abdomen and pelvis in January 2023  HISTORY:  Past Medical History:  Past Medical History:  Diagnosis Date   Allergic rhinitis    Arthritis     Bronchiectasis ([place])    Colon polyps    Diverticulosis    Emphysema of lung ([place])    GERD (gastroesophageal reflux disease)    HSV-1 (herpes simplex virus 1) infection    Hyperparathyroidism ([place])    Hypertension    Pulmonary nodule 07/10/2017   CT 01/2017, CT 07/2017 - stable on follow up, no further follow up recommended.   Past Surgical History:  Past Surgical History:  Procedure Laterality Date   ABDOMINAL HYSTERECTOMY     ovaries spared   CESAREAN SECTION  1969   COLONOSCOPY     DIRECT LARYNGOSCOPY N/A 10/21/2014   Procedure: DIRECT LARYNGOSCOPY;  Surgeon: Rozetta Nunnery, MD;  Location: [place];  Service: ENT;  Laterality: N/A;   EXCISION NASAL MASS Right 10/21/2014   Procedure: EXCISION NASOPHARYNGEAL MASS;  Surgeon: Rozetta Nunnery, MD;  Location: [place];  Service: ENT;  Laterality: Right;   EYE SURGERY     cataracts   MASS EXCISION Right 10/21/2014   Procedure: RIGHT NECK NODE BIOPSY;  Surgeon: Rozetta Nunnery, MD;  Location: [employer];  Service: ENT;  Laterality: Right;    Social History:  reports that she quit smoking about 7 years ago. Her smoking use included cigarettes. She started smoking about 65 years ago. She has a 58.00 pack-year smoking history. She has never used smokeless tobacco. She reports that she does not drink alcohol  and does not use drugs. Family History: family history includes Breast cancer in her maternal grandmother, sister, and sister; Cancer in her brother and brother; Heart attack in her father; Hypertension in her mother; Lung cancer (age of onset: 68) in her sister; Other in her brother; Prostate cancer in her brother; Stroke in her mother.   HOME MEDICATIONS: Allergies as of 04/30/2022       Reactions   Ace Inhibitors Cough   Lemon Oil Hives   Reaction to lemons   Natural Vegetable Orange [psyllium]    oranges   Shellfish Allergy Swelling, Hives        Medication List         Accurate as of April 30, 2022  7:07 AM. If you have any questions, ask your nurse or doctor.          acetaminophen 500 MG tablet Commonly known as: TYLENOL Take 500 mg by mouth every 6 (six) hours as needed (pain).   amLODipine 10 MG tablet Commonly known as: NORVASC TAKE 1 TABLET BY MOUTH EVERY DAY FOR BLOOD PRESSURE   azelastine 0.1 % nasal spray Commonly known as: ASTELIN   BENEFIBER PO Take 1 Dose by mouth daily.   CLEAR EYES OP Place 1 drop into both eyes daily.   famotidine 40 MG tablet Commonly known as: PEPCID TAKE 1 TABLET DAILY TO PREVENT HEARTBURN & INDIGESTION   halobetasol 0.05 % cream Commonly known as: Ultravate Apply topically 2 (two) times daily. Apply to Eczema Rash 2 x/day   hydrocortisone 2.5 % cream   Magnesium 250 MG Tabs Take 1 tablet (250 mg total) by mouth daily.   nitroGLYCERIN 0.4 MG SL tablet Commonly known as: NITROSTAT Place 0.4 mg under the tongue every 5 (five) minutes as needed for chest pain.   OVER THE COUNTER MEDICATION Takes OTC eczema cream PRN.   pantoprazole 40 MG tablet Commonly known as: PROTONIX Take 1 tablet (40 mg total) by mouth daily.   rosuvastatin 5 MG tablet Commonly known as: CRESTOR TAKE 1 TAB IN THE EVENING 3 DAYS A WEEK (MWF) FOR CHOLESTEROL TO REDUCE HEART ATTACK/STROKE RISK. What changed: additional instructions   triamterene-hydrochlorothiazide 37.5-25 MG tablet Commonly known as: MAXZIDE-25 TAKE 1 TABLET DAILY FOR BP & FLUID   vitamin C 100 MG tablet Take 100 mg by mouth daily.          REVIEW OF SYSTEMS: A comprehensive ROS was conducted with the patient and is negative except as per HPI    OBJECTIVE:  VS: There were no vitals taken for this visit.   Wt Readings from Last 3 Encounters:  04/09/22 145 lb (65.8 kg)  12/05/21 148 lb 12.8 oz (67.5 kg)  10/29/21 151 lb 12.8 oz (68.9 kg)     EXAM: General: Pt appears well and is in NAD  Neck: General: Supple without  adenopathy. Thyroid: Thyroid size normal.  No goiter or nodules appreciated.  Lungs: Clear with good BS bilat with no rales, rhonchi, or wheezes  Heart: Auscultation: RRR.  Abdomen: Normoactive bowel sounds, soft, nontender, without masses or organomegaly palpable  Extremities:  BL LE: No pretibial edema normal ROM and strength.  Mental Status: Judgment, insight: Intact Orientation: Oriented to time, place, and person Mood and affect: No depression, anxiety, or agitation     DATA REVIEWED:    Latest Reference Range & Units 04/09/22 12:02  Sodium 135 - 146 mmol/L 142  Potassium 3.5 - 5.3 mmol/L 3.7  Chloride 98 - 110 mmol/L  104  CO2 20 - 32 mmol/L 28  Glucose 65 - 99 mg/dL 93  Mean Plasma Glucose mg/dL 117  BUN 7 - 25 mg/dL 15  Creatinine 0.60 - 0.95 mg/dL 0.92  Calcium 8.6 - 10.4 mg/dL 10.5 (H)  BUN/Creatinine Ratio 6 - 22 (calc) NOT APPLICABLE  eGFR > OR = 60 mL/min/1.26m 63  AG Ratio 1.0 - 2.5 (calc) 1.3  AST 10 - 35 U/L 18  ALT 6 - 29 U/L 17  Total Protein 6.1 - 8.1 g/dL 7.4  Total Bilirubin 0.2 - 1.2 mg/dL 0.4    Latest Reference Range & Units 04/09/22 12:02  Alkaline phosphatase (APISO) 37 - 153 U/L 65  Vitamin D, 25-Hydroxy 30 - 100 ng/mL 54    DXA 11/06/2021 normal     ASSESSMENT/PLAN/RECOMMENDATIONS:    1.Hyperparathyroidism:  - High suspicion for [place]( Familial hypo calciuric hypercalcemia ) - Chronic history of hypercalcemia per pt since her 40's.  - No indication for surgical intervention - DXA normal 10/2021 -24-hour urine collection in 02/2021 with 48 mg, concomitant 24-hour creatinine not available - Will repeat 24-hr urine this year , keeping in mind she is on HCTZ    Recommendations  - Stay Hydrated  - Avoid over the counter calcium tablets  - Maintain 2-3 servings of dietary calcium daily ( examples: low fat yogurt, cheese, milk  and green leafy vegetables )    2. HyperVitaminosis D   - Vitamin D was stopped with normalization of vitamin D     F/U in 6 months   Signed electronically by: AMack Guise MD  [facility]  [address], [place] [address]Phone: [phone]: [phone]  CC: MUnk Pinto MD [address]Phone: [phone]: [phone]  Return to Endocrinology clinic as below: Future Appointments  Date Time Provider [place] 04/30/2022  9:10 AM Cinde Ebert, IMelanie Crazier MD [facility] None  12/10/2022  3:00 PM CDarrol Jump NP [facility] None  03/13/2023  3:00 PM CDarrol Jump NP [facility] None

## 2022-04-30 NOTE — Patient Instructions (Addendum)
-   Stay Hydrated  - Avoid over the counter calcium tablets  - Maintain 2-3 servings of dietary calcium daily ( examples: low fat yogurt, cheese, milk  and green leafy vegetables )     24-Hour Urine Collection  You will be collecting your urine for a 24-hour period of time. Your timer starts with your first urine of the morning (For example - If you first pee at [place], your timer will start at [place]) [place] away your first urine of the morning Collect your urine every time you pee for the next 24 hours STOP your urine collection 24 hours after you started the collection (For example - You would stop at 9AM the day after you started)

## 2022-05-07 ENCOUNTER — Other Ambulatory Visit: Payer: Medicare PPO

## 2022-05-07 DIAGNOSIS — E210 Primary hyperparathyroidism: Secondary | ICD-10-CM

## 2022-05-07 NOTE — Progress Notes (Unsigned)
Total volume 1,350.  Started 05-06-2022 at 6:45 a.m., ended 05-07-2022 at 6:52 a.m.

## 2022-05-09 LAB — CREATININE, URINE, 24 HOUR: Creatinine, 24H Ur: 0.84 g/(24.h) (ref 0.50–2.15)

## 2022-05-09 LAB — CALCIUM, URINE, 24 HOUR: Calcium, 24H Urine: 74 mg/24 h

## 2022-05-13 DIAGNOSIS — Z1231 Encounter for screening mammogram for malignant neoplasm of breast: Secondary | ICD-10-CM | POA: Diagnosis not present

## 2022-05-13 LAB — HM MAMMOGRAPHY

## 2022-05-23 ENCOUNTER — Encounter: Payer: Self-pay | Admitting: Internal Medicine

## 2022-08-02 ENCOUNTER — Encounter: Payer: Self-pay | Admitting: *Deleted

## 2022-08-02 ENCOUNTER — Encounter: Payer: Self-pay | Admitting: Internal Medicine

## 2022-08-21 ENCOUNTER — Encounter: Payer: Self-pay | Admitting: *Deleted

## 2022-08-22 NOTE — Progress Notes (Signed)
Unable to reach pt by phone or leave message. Reached out to PCP noted in upcoming appt in [facility] to confirm pt now sees Darrol Jump, NP, at [facility]. Office confirms pt last seen by PCP in July, 2023 and has future appt in March, 2024. Given info of elevated b/p noted at screening event, scheduler states office will reach out to pt to schedule sooner appt to f/u on b/p. Letter with results also sent to pt today with confirmation of need to see PCP within the month if possible.

## 2022-08-26 ENCOUNTER — Other Ambulatory Visit: Payer: Self-pay

## 2022-08-26 MED ORDER — ROSUVASTATIN CALCIUM 5 MG PO TABS: ORAL_TABLET | ORAL | 2 refills | Status: DC

## 2022-08-30 ENCOUNTER — Other Ambulatory Visit: Payer: Self-pay

## 2022-08-30 MED ORDER — FAMOTIDINE 40 MG PO TABS: ORAL_TABLET | ORAL | 3 refills | Status: DC

## 2022-10-18 ENCOUNTER — Other Ambulatory Visit: Payer: Self-pay | Admitting: Gastroenterology

## 2022-10-18 DIAGNOSIS — G8929 Other chronic pain: Secondary | ICD-10-CM

## 2022-11-05 ENCOUNTER — Encounter: Payer: Self-pay | Admitting: Internal Medicine

## 2022-11-05 ENCOUNTER — Ambulatory Visit: Payer: Medicare PPO | Admitting: Internal Medicine

## 2022-11-05 VITALS — BP 122/76 | HR 80 | Ht 66.0 in | Wt 145.0 lb

## 2022-11-05 DIAGNOSIS — E673 Hypervitaminosis D: Secondary | ICD-10-CM | POA: Diagnosis not present

## 2022-11-05 DIAGNOSIS — E210 Primary hyperparathyroidism: Secondary | ICD-10-CM | POA: Diagnosis not present

## 2022-11-05 DIAGNOSIS — E876 Hypokalemia: Secondary | ICD-10-CM

## 2022-11-05 LAB — BASIC METABOLIC PANEL
BUN: 16 mg/dL (ref 6–23)
CO2: 29 mEq/L (ref 19–32)
Calcium: 10.8 mg/dL — ABNORMAL HIGH (ref 8.4–10.5)
Chloride: 101 mEq/L (ref 96–112)
Creatinine, Ser: 0.91 mg/dL (ref 0.40–1.20)
GFR: 58.95 mL/min — ABNORMAL LOW (ref >60.00)
Glucose, Bld: 98 mg/dL (ref 70–99)
Potassium: 3.3 mEq/L — ABNORMAL LOW (ref 3.5–5.1)
Sodium: 139 mEq/L (ref 135–145)

## 2022-11-05 LAB — ALBUMIN: Albumin: 4.1 g/dL (ref 3.5–5.2)

## 2022-11-05 LAB — VITAMIN D 25 HYDROXY (VIT D DEFICIENCY, FRACTURES): VITD: 43.7 ng/mL (ref 30.00–100.00)

## 2022-11-05 NOTE — Patient Instructions (Signed)
-   Stay Hydrated  - Avoid over the counter calcium tablets  - Maintain 2-3 servings of dietary calcium daily ( examples: low fat yogurt, cheese, milk  and green leafy vegetables )

## 2022-11-05 NOTE — Progress Notes (Unsigned)
Name: Catherine Parks  MRN/ DOB: [id number], 1941/09/07    Age/ Sex: 82 y.o., female    PCP: Darrol Jump, NP   Reason for Endocrinology Evaluation: Hypercalcemia      Date of Initial Endocrinology Evaluation: 10/29/2021    HPI: [name] is a 82 y.o. female with a past medical history of HTN , low bone density and dyslipidemia . The patient presented for initial endocrinology clinic visit on 10/29/2021 for consultative assistance with her Hypercalcemia .   Catherine Parks indicates that she was first diagnosed with hypercalcemia in  her 41's. In review of her chart she has been noted with hypercalcemia in 2015, with a max level of 11.1 mg/dL (uncorrected)  in 09/2021 with inappropriately normal PTH  at 59 pg/mL in 12/2020. Pr  She denies  use of over the counter calcium (including supplements, Tums, Rolaids, or other calcium containing antacids),or  lithium.  She is on  HCTZ, and vitamin D 7000 iu supplement.  She denies  history of kidney stones, kidney disease, liver disease, granulomatous disease. She denies  osteoporosis or prior fractures.  She denies  family history of osteoporosis, parathyroid disease, thyroid disease.    On her initial visit to our clinic she had an elevated vitamin D at 113 NG/mL, which was discontinued with repeat levels normalizing at 54 NG/mL in July 2023   Her 24-hour urinary calcium excretion was normal at 48 mg/DL in June 2022  DXA in 2020 showed low bone density with a T score of -1.4 at the left total hip Repeat DXA in 10/2021 was normal   SUBJECTIVE:    Today (11/05/22):  Catherine Parks is here for follow-up on hypercalcemia and hypervitaminosis D.   She has been staying hydrated  Denies polydipsia Started with constipation this week  She is not on vitamin D anymore  Denies recent falls Denies renal stones      HISTORY:  Past Medical History:  Past Medical History:  Diagnosis Date   Allergic rhinitis    Arthritis     Bronchiectasis ([place])    Colon polyps    Diverticulosis    Emphysema of lung ([facility])    GERD (gastroesophageal reflux disease)    HSV-1 (herpes simplex virus 1) infection    Hyperparathyroidism ([place])    Hypertension    Pulmonary nodule 07/10/2017   CT 01/2017, CT 07/2017 - stable on follow up, no further follow up recommended.   Past Surgical History:  Past Surgical History:  Procedure Laterality Date   ABDOMINAL HYSTERECTOMY     ovaries spared   CESAREAN SECTION  1969   COLONOSCOPY     DIRECT LARYNGOSCOPY N/A 10/21/2014   Procedure: DIRECT LARYNGOSCOPY;  Surgeon: Rozetta Nunnery, MD;  Location: [place];  Service: ENT;  Laterality: N/A;   EXCISION NASAL MASS Right 10/21/2014   Procedure: EXCISION NASOPHARYNGEAL MASS;  Surgeon: Rozetta Nunnery, MD;  Location: [place];  Service: ENT;  Laterality: Right;   EYE SURGERY     cataracts   MASS EXCISION Right 10/21/2014   Procedure: RIGHT NECK NODE BIOPSY;  Surgeon: Rozetta Nunnery, MD;  Location: [place];  Service: ENT;  Laterality: Right;    Social History:  reports that she quit smoking about 8 years ago. Her smoking use included cigarettes. She started smoking about 66 years ago. She has a 58.00 pack-year smoking history. She has never used smokeless tobacco. She reports that she does  not drink alcohol and does not use drugs. Family History: family history includes Breast cancer in her maternal grandmother, sister, and sister; Cancer in her brother and brother; Heart attack in her father; Hypertension in her mother; Lung cancer (age of onset: 7) in her sister; Other in her brother; Prostate cancer in her brother; Stroke in her mother.   HOME MEDICATIONS: Allergies as of 11/05/2022       Reactions   Ace Inhibitors Cough   Lemon Oil Hives   Reaction to lemons   Natural Vegetable Orange [psyllium]    oranges   Shellfish Allergy Swelling, Hives        Medication List         Accurate as of November 05, 2022  7:11 AM. If you have any questions, ask your nurse or doctor.          acetaminophen 500 MG tablet Commonly known as: TYLENOL Take 500 mg by mouth every 6 (six) hours as needed (pain).   amLODipine 10 MG tablet Commonly known as: NORVASC TAKE 1 TABLET BY MOUTH EVERY DAY FOR BLOOD PRESSURE   azelastine 0.1 % nasal spray Commonly known as: ASTELIN   BENEFIBER PO Take 1 Dose by mouth daily.   CLEAR EYES OP Place 1 drop into both eyes daily.   famotidine 40 MG tablet Commonly known as: PEPCID TAKE 1 TABLET DAILY TO PREVENT HEARTBURN & INDIGESTION   halobetasol 0.05 % cream Commonly known as: Ultravate Apply topically 2 (two) times daily. Apply to Eczema Rash 2 x/day   hydrocortisone 2.5 % cream   Magnesium 250 MG Tabs Take 1 tablet (250 mg total) by mouth daily.   nitroGLYCERIN 0.4 MG SL tablet Commonly known as: NITROSTAT Place 0.4 mg under the tongue every 5 (five) minutes as needed for chest pain.   OVER THE COUNTER MEDICATION Takes OTC eczema cream PRN.   pantoprazole 40 MG tablet Commonly known as: PROTONIX TAKE 1 TABLET BY MOUTH TWICE A DAY   rosuvastatin 5 MG tablet Commonly known as: CRESTOR TAKE 1 TAB IN THE EVENING 5 DAYS A WEEK FOR CHOLESTEROL TO REDUCE HEART ATTACK/STROKE RISK   triamterene-hydrochlorothiazide 37.5-25 MG tablet Commonly known as: MAXZIDE-25 TAKE 1 TABLET DAILY FOR BP & FLUID   vitamin C 100 MG tablet Take 100 mg by mouth daily.          REVIEW OF SYSTEMS: A comprehensive ROS was conducted with the patient and is negative except as per HPI    OBJECTIVE:  VS: There were no vitals taken for this visit.   Wt Readings from Last 3 Encounters:  04/30/22 143 lb (64.9 kg)  04/09/22 145 lb (65.8 kg)  12/05/21 148 lb 12.8 oz (67.5 kg)     EXAM: General: Pt appears well and is in NAD  Neck: General: Supple without adenopathy. Thyroid: Thyroid size normal.  No goiter or nodules  appreciated.  Lungs: Clear with good BS bilat with no rales, rhonchi, or wheezes  Heart: Auscultation: RRR.  Abdomen:  soft, nontender  Extremities:  BL LE: No pretibial edema  Mental Status: Judgment, insight: Intact Orientation: Oriented to time, place, and person Mood and affect: No depression, anxiety, or agitation     DATA REVIEWED: ***   DXA 11/06/2021 normal     ASSESSMENT/PLAN/RECOMMENDATIONS:    1.Hyperparathyroidism:  - High suspicion for [place] ( Familial hypocalciuric hypercalcemia ) - Chronic history of hypercalcemia per pt since her 16's.  - No indication for surgical intervention - DXA normal  10/2021 -24-hour urine collection in 02/2021 with 48 mg, repeat 74 mg 04/2022 while on HCTZ    Recommendations  - Stay Hydrated  - Avoid over the counter calcium tablets  - Maintain 2-3 servings of dietary calcium daily ( examples: low fat yogurt, cheese, milk  and green leafy vegetables )    2. HyperVitaminosis D   - Vitamin D was stopped with normalization of vitamin D    F/U in 6 months   Signed electronically by: Mack Guise, MD  [facility]  [facility] [address] [place], [place] [address] Phone: [phone] FAX: [phone]   CC: Darrol Jump, [facility] [address] [place] [address] Phone: [phone] Fax: [phone]   Return to Endocrinology clinic as below: Future Appointments  Date Time Provider [place]  11/05/2022  9:10 AM Nahiara Kretzschmar, Melanie Crazier, MD [facility] None  12/10/2022  3:00 PM Darrol Jump, NP [facility] None  03/13/2023  3:00 PM Darrol Jump, NP [facility] None

## 2022-11-06 DIAGNOSIS — E876 Hypokalemia: Secondary | ICD-10-CM | POA: Insufficient documentation

## 2022-11-06 LAB — PARATHYROID HORMONE, INTACT (NO CA): PTH: 44 pg/mL (ref 16–77)

## 2022-11-06 MED ORDER — POTASSIUM CHLORIDE CRYS ER 20 MEQ PO TBCR: 20.0000 meq | EXTENDED_RELEASE_TABLET | Freq: Every day | ORAL | 0 refills | Status: DC

## 2022-11-23 ENCOUNTER — Other Ambulatory Visit: Payer: Self-pay | Admitting: Nurse Practitioner

## 2022-12-10 ENCOUNTER — Encounter: Payer: Medicare PPO | Admitting: Nurse Practitioner

## 2022-12-11 ENCOUNTER — Other Ambulatory Visit: Payer: Self-pay | Admitting: Internal Medicine

## 2022-12-30 ENCOUNTER — Ambulatory Visit (INDEPENDENT_AMBULATORY_CARE_PROVIDER_SITE_OTHER): Payer: Medicare PPO | Admitting: Nurse Practitioner

## 2022-12-30 ENCOUNTER — Encounter: Payer: Self-pay | Admitting: Nurse Practitioner

## 2022-12-30 VITALS — BP 150/90 | HR 68 | Temp 97.3°F | Ht 64.5 in | Wt 146.6 lb

## 2022-12-30 DIAGNOSIS — R7309 Other abnormal glucose: Secondary | ICD-10-CM

## 2022-12-30 DIAGNOSIS — M79674 Pain in right toe(s): Secondary | ICD-10-CM

## 2022-12-30 DIAGNOSIS — K5790 Diverticulosis of intestine, part unspecified, without perforation or abscess without bleeding: Secondary | ICD-10-CM

## 2022-12-30 DIAGNOSIS — I700 Atherosclerosis of aorta: Secondary | ICD-10-CM | POA: Diagnosis not present

## 2022-12-30 DIAGNOSIS — Z0000 Encounter for general adult medical examination without abnormal findings: Secondary | ICD-10-CM

## 2022-12-30 DIAGNOSIS — M898X1 Other specified disorders of bone, shoulder: Secondary | ICD-10-CM

## 2022-12-30 DIAGNOSIS — I10 Essential (primary) hypertension: Secondary | ICD-10-CM

## 2022-12-30 DIAGNOSIS — E785 Hyperlipidemia, unspecified: Secondary | ICD-10-CM | POA: Diagnosis not present

## 2022-12-30 DIAGNOSIS — E559 Vitamin D deficiency, unspecified: Secondary | ICD-10-CM

## 2022-12-30 DIAGNOSIS — M1990 Unspecified osteoarthritis, unspecified site: Secondary | ICD-10-CM

## 2022-12-30 DIAGNOSIS — Z79899 Other long term (current) drug therapy: Secondary | ICD-10-CM | POA: Diagnosis not present

## 2022-12-30 DIAGNOSIS — M8580 Other specified disorders of bone density and structure, unspecified site: Secondary | ICD-10-CM

## 2022-12-30 DIAGNOSIS — G8929 Other chronic pain: Secondary | ICD-10-CM

## 2022-12-30 DIAGNOSIS — N182 Chronic kidney disease, stage 2 (mild): Secondary | ICD-10-CM | POA: Diagnosis not present

## 2022-12-30 DIAGNOSIS — Z0001 Encounter for general adult medical examination with abnormal findings: Secondary | ICD-10-CM

## 2022-12-30 DIAGNOSIS — J849 Interstitial pulmonary disease, unspecified: Secondary | ICD-10-CM

## 2022-12-30 DIAGNOSIS — J471 Bronchiectasis with (acute) exacerbation: Secondary | ICD-10-CM

## 2022-12-30 DIAGNOSIS — J449 Chronic obstructive pulmonary disease, unspecified: Secondary | ICD-10-CM

## 2022-12-30 DIAGNOSIS — E210 Primary hyperparathyroidism: Secondary | ICD-10-CM

## 2022-12-30 DIAGNOSIS — K219 Gastro-esophageal reflux disease without esophagitis: Secondary | ICD-10-CM

## 2022-12-30 DIAGNOSIS — B009 Herpesviral infection, unspecified: Secondary | ICD-10-CM

## 2022-12-30 DIAGNOSIS — J302 Other seasonal allergic rhinitis: Secondary | ICD-10-CM

## 2022-12-30 NOTE — Progress Notes (Signed)
CPE  Assessment:   Diagnoses and all orders for this visit:  Annual Physical Due annually  Health Maintenance reviewed Healthy Goals Set   Essential hypertension Controlled  Continue medications;  Discussed DASH (Dietary Approaches to Stop Hypertension) DASH diet is lower in sodium than a typical American diet. Cut back on foods that are high in saturated fat, cholesterol, and trans fats. Eat more whole-grain foods, fish, poultry, and nuts Remain active and exercise as tolerated daily.  Monitor BP at home-Call if greater than 130/80.  Check CBC/CMP  Aortic atherosclerosis (HCC) Controlled Continue medications;  Discussed lifestyle modifications. Recommended diet heavy in fruits and veggies, omega 3's. Decrease consumption of animal meats, cheeses, and dairy products. Remain active and exercise as tolerated. Continue to monitor. Check lipids  ILD (interstitial lung disease) (HCC) Followed by Pulmonology once yearly  No recent flares or complications. Continue to monitor  COPD, mild (HCC) Followed by Pulmonology once yearly  No recent flares or complications. Continue to monitor  Chronic seasonal allergic rhinitis Controlled. No recent flare. Antihistamine OTC PRN Avoid triggers  Bronchiectasis without acute exacerbation (HCC) Followed by Pulmonology once yearly  She is due to see in 04/2022 No recent flares or complications. Continue to monitor  Gastroesophageal reflux disease, esophagitis presence not specified No suspected reflux complications (Barret/stricture). Lifestyle modification:  wt loss, avoid meals 2-3h before bedtime. Consider eliminating food triggers:  chocolate, caffeine, EtOH, acid/spicy food.  Completed colonoscopy Continue to monitor  Diverticulosis Increase fiber and fluids Avoid greasy and fatty foods when flared.  Arthritis Tylenol PRN Can take up to 3000 mg in 24 hours. Rest when flared.   CKD (chronic kidney disease) stage 3,  GFR 30-59 ml/min (HCC) Discussed how what you eat and drink can aide in kidney protection. Stay well hydrated. Avoid high salt foods. Avoid NSAIDS. Keep BP and BG well controlled.   Take medications as prescribed. Remain active and exercise as tolerated daily. Maintain weight.  Continue to monitor. Check GFR/CMP  Vitamin D deficiency Continue supplementation Check vitamin D level Dexa scan ordered.  Other abnormal glucose Education: Reviewed 'ABCs' of diabetes management  A1C (<7):  Goal Met. Blood pressure (<130/80):  Goal Met Cholesterol (LDL <70):  Goal Met Continue Eye Exam yearly  Continue Dental Exam Q6 mo Discussed dietary recommendations Check A1C  Medication management All medications discussed and reviewed in full. All questions and concerns regarding medications addressed.     HSV-1 (herpes simplex virus 1) infection Antiviral PRN  Dyslipidemia Controlled Continue medications;  Discussed lifestyle modifications. Recommended diet heavy in fruits and veggies, omega 3's. Decrease consumption of animal meats, cheeses, and dairy products. Remain active and exercise as tolerated. Continue to monitor.   Osteopenia Pursue a combination of weight-bearing exercises and strength training. Advised on fall prevention measures including proper lighting in all rooms, removal of area rugs and floor clutter, use of walking devices as deemed appropriate, avoidance of uneven walking surfaces. Consume 800 to 1000 IU of vitamin D daily with a goal vitamin D serum value of 30 ng/mL or higher. Aim for 1000 to 1200 mg of elemental calcium daily through supplements and/or dietary sources. Complete Dexa.   Abdominal pain/ Polyp of colon, unspecified part of colon, unspecified type Suggest fiber supplement, increase water, increase exercise  Pain of left Clavicle. Resolved Can take up to 3,000 mg in 24 hours. Rest when flared. Continue to monitor  Primary  Hyperparathyroidism  Endocrinology Dr. Lonzo Cloud following - last 2/20214 Noo indication for surgical intervention Serum  calcium remains elevated but overall stable with inappropriately normal PTH Avoid OTC calcium tablets. DXA normal 10/21  Continue to monitor  Right great toe pain Bunion versus gout flare Check uric acid Discussed low purine diet Continue to monitor  Orders Placed This Encounter  Procedures   CBC with Differential/Platelet   COMPLETE METABOLIC PANEL WITH GFR   Magnesium   Lipid panel   TSH   Hemoglobin A1c   Insulin, random   VITAMIN D 25 Hydroxy (Vit-D Deficiency, Fractures)   Urinalysis, Routine w reflex microscopic   Microalbumin / creatinine urine ratio   Uric acid   EKG 12-Lead    Notify office for further evaluation and treatment, questions or concerns if any reported s/s fail to improve.   The patient was advised to call back or seek an in-person evaluation if any symptoms worsen or if the condition fails to improve as anticipated.   Further disposition pending results of labs. Discussed med's effects and SE's.    I discussed the assessment and treatment plan with the patient. The patient was provided an opportunity to ask questions and all were answered. The patient agreed with the plan and demonstrated an understanding of the instructions.  Discussed med's effects and SE's. Screening labs and tests as requested with regular follow-up as recommended.  I provided 35 minutes of face-to-face time during this encounter including counseling, chart review, and critical decision making was preformed.  Today's Plan of Care is based on a patient-centered health care approach known as shared decision making - the decisions, tests and treatments allow for patient preferences and values to be balanced with clinical evidence.     Future Appointments  Date Time Provider Department Center  04/02/2023 10:00 AM Adela Glimpse, NP [facility] None  11/06/2023   9:30 AM Shamleffer, Konrad Dolores, MD [facility] None  12/30/2023 10:00 AM Adela Glimpse, NP [facility] None     Plan:   During the course of the visit the patient was educated and counseled about appropriate screening and preventive services including:   Pneumococcal vaccine  Prevnar 13 Influenza vaccine Td vaccine Screening electrocardiogram Bone densitometry screening Colorectal cancer screening Diabetes screening Glaucoma screening Nutrition counseling  Advanced directives: requested   Subjective:  Catherine Parks is a 82 y.o. female who presents for CPE and 3 month follow up. She has Essential hypertension; HSV-1 (herpes simplex virus 1) infection; Diverticulosis; History of colon polyps; Vitamin D deficiency; CKD (chronic kidney disease) stage 2, GFR 60-89 ml/min; Medication management; GERD ; COPD, mild; Arthritis; Chronic seasonal allergic rhinitis; ILD (interstitial lung disease); Aortic atherosclerosis; Abnormal EKG; Former smoker; Abnormal glucose (prediabetes); Hyperlipidemia, mixed; Osteopenia; Hypercalcemia; Chronic lumbar pain; T12 compression fracture; Hiatal hernia; Hypervitaminosis D; Primary hyperparathyroidism; and Hypokalemia on their problem list.  Overall she reports feeling well.  She recently followed with Endocrinology Dr. Lonzo Cloud for Primary Hyperparathyroidism  - last 2/20214. Noo indication for surgical intervention Serum calcium remains elevated but overall stable with inappropriately normal PTH.  She was instructed to avoid OTC calcium tablets. DXA normal 10/21   Complains of right great toe pain.  Does note a past hx of gout without a flare is "several years."  Unsure if it is a bunion.  More tender on the lateral side of the joint.  States wearing house shoes most often.    She has intermittent left clavicle pain.  No know recent injury or fall.  Has tried 500 mg Tylenol with some improvement.  Hx of osteopenia.    Patient  has been followed  by Dr Marchelle Gearing for COPD/ILD and a 6 mm LLL nodule by CT scans which has remained stable in size since 1st discovered in May 2018.  Follows with pulm annually, getting low dose screening CT due to 58 pack year smoking hx, quit in 2016. Some emphysematous changes, ILD. ECHO 06/2020 was benign without evidence of PAH. Plans to follow up yearly as scheduled.   Patient has hx/o GERD stable by lifestyle & famotidine.  She has hx of colon polyps, diverticulosis, last colonoscopy ; 06/2021 with no recall.  She reported having more constipation (intermittent, normal calibur), lower abdominal discomfort, she was advised fiber, miralax, fluid intake (reports improved with fiber) and she was referred to GI Dr. Orvan Falconer for routine colonoscopy but hasn't heard back to schedule. She thinks she now has the occasional diverticulitis flare.  She walks around to keep bowels active and takes tylenol to ease the pain.  Most days this helps.  She has lumbar pain with moderate spondylosis on xray 11/2020, manging well with PRN tylenol.   BMI is Body mass index is 24.78 kg/m., she has been working on diet, not exercising  Wt Readings from Last 3 Encounters:  12/30/22 146 lb 9.6 oz (66.5 kg)  11/05/22 145 lb (65.8 kg)  04/30/22 143 lb (64.9 kg)   She has aortic atherosclerosis per CT 03/2019 Had stress test and ECHO in 12/2017 following chest/jaw pain episode which were unremarkable -    Her blood pressure has been controlled at home (110-120s/70s), today their BP is BP: (!) 150/90, will take clonidine 0.2 mg PRN, typically once a week for labile BPs She does not workout. She denies chest pain, shortness of breath, dizziness.   She is on cholesterol medication (rosuvastatin 5 mg 3 days a week) and denies myalgias. Her cholesterol is not at goal. She is doing plant based, no dairy, low fat poultry. The cholesterol last visit was:   Lab Results  Component Value Date   CHOL 164 04/09/2022   HDL 72 04/09/2022   LDLCALC  75 04/09/2022   TRIG 83 04/09/2022   CHOLHDL 2.3 04/09/2022    She has been working on diet and exercise for glucose management, and denies foot ulcerations, increased appetite, nausea, paresthesia of the feet, polydipsia, polyuria, visual disturbances, vomiting and weight loss.  Last A1C in the office was:  Lab Results  Component Value Date   HGBA1C 5.7 (H) 04/09/2022   Last GFR: Lab Results  Component Value Date   GFRAA 70 03/09/2021   Patient is on Vitamin D supplement and at goal at recent check:    Lab Results  Component Value Date   VD25OH 43.70 11/05/2022     Has had elevated calcium, denies supplement or tums.  Lab Results  Component Value Date   PTH 44 11/05/2022   CALCIUM 10.8 (H) 11/05/2022      Medication Review: Current Outpatient Medications on File Prior to Visit  Medication Sig Dispense Refill   acetaminophen (TYLENOL) 500 MG tablet Take 500 mg by mouth every 6 (six) hours as needed (pain).     amLODipine (NORVASC) 10 MG tablet TAKE 1 TABLET BY MOUTH EVERY DAY FOR BLOOD PRESSURE 90 tablet 3   Ascorbic Acid (VITAMIN C) 100 MG tablet Take 100 mg by mouth daily.     azelastine (ASTELIN) 0.1 % nasal spray      famotidine (PEPCID) 40 MG tablet TAKE 1 TABLET DAILY TO PREVENT HEARTBURN & INDIGESTION 90 tablet 3  halobetasol (ULTRAVATE) 0.05 % cream Apply topically 2 (two) times daily. Apply to Eczema Rash 2 x/day 100 g 11   hydrocortisone 2.5 % cream      Magnesium 250 MG TABS Take 1 tablet (250 mg total) by mouth daily. 30 tablet 0   Naphazoline HCl (CLEAR EYES OP) Place 1 drop into both eyes daily.     OVER THE COUNTER MEDICATION Takes OTC eczema cream PRN.     pantoprazole (PROTONIX) 40 MG tablet TAKE 1 TABLET BY MOUTH TWICE A DAY 180 tablet 0   rosuvastatin (CRESTOR) 5 MG tablet TAKE 1 TAB IN THE EVENING 5 DAYS A WEEK FOR CHOLESTEROL TO REDUCE HEART ATTACK/STROKE RISK 60 tablet 1   triamterene-hydrochlorothiazide (MAXZIDE-25) 37.5-25 MG tablet TAKE 1 TABLET  DAILY FOR BP & FLUID 90 tablet 3   Wheat Dextrin (BENEFIBER PO) Take 1 Dose by mouth daily.     KLOR-CON M20 20 MEQ tablet TAKE 1 TABLET BY MOUTH EVERY DAY (Patient not taking: Reported on 12/30/2022) 90 tablet 1   nitroGLYCERIN (NITROSTAT) 0.4 MG SL tablet Place 0.4 mg under the tongue every 5 (five) minutes as needed for chest pain. (Patient not taking: Reported on 12/30/2022)     No current facility-administered medications on file prior to visit.    Allergies  Allergen Reactions   Ace Inhibitors Cough   Lemon Oil Hives    Reaction to lemons   Natural Vegetable Orange [Psyllium]     oranges   Shellfish Allergy Swelling and Hives    Current Problems (verified) Patient Active Problem List   Diagnosis Date Noted   Hypokalemia 11/06/2022   Hypervitaminosis D 10/31/2021   Primary hyperparathyroidism 10/31/2021   Hiatal hernia 10/16/2021   T12 compression fracture 10/12/2021   Chronic lumbar pain 12/07/2020   Hypercalcemia 12/06/2020   Osteopenia 12/05/2020   Former smoker 05/08/2018   Abnormal glucose (prediabetes) 05/08/2018   Hyperlipidemia, mixed 05/08/2018   Abnormal EKG 12/22/2017   Aortic atherosclerosis 07/10/2017   ILD (interstitial lung disease) 09/27/2016   Arthritis 08/02/2016   Chronic seasonal allergic rhinitis 08/02/2016   COPD, mild 06/07/2016   GERD  01/05/2015   CKD (chronic kidney disease) stage 2, GFR 60-89 ml/min 06/08/2014   Medication management 06/08/2014   Vitamin D deficiency 08/19/2013   Essential hypertension    HSV-1 (herpes simplex virus 1) infection    Diverticulosis    History of colon polyps     Screening Tests Immunization History  Administered Date(s) Administered   Covid-19, Mrna,Vaccine(Spikevax)30yrs and older 09/04/2022   DTaP 07/30/2011   Fluad Quad(high Dose 65+) 08/25/2020   Influenza, High Dose Seasonal PF 06/07/2016, 06/25/2017, 09/22/2018, 07/19/2019, 07/25/2021   Influenza-Unspecified 08/02/2022   PFIZER(Purple  Top)SARS-COV-2 Vaccination 10/05/2019, 10/25/2019, 08/25/2020   Pfizer Covid-19 Vaccine Bivalent Booster 61yrs & up 07/21/2021   Pneumococcal Conjugate-13 11/22/2015   Pneumococcal Polysaccharide-23 05/11/2013   Pneumococcal-Unspecified 01/07/2002   Respiratory Syncytial Virus Vaccine,Recomb Aduvanted(Arexvy) 09/04/2022   Td 01/07/2002, 05/07/2012   Preventative care: Last colonoscopy: 06/2021 No surveillance colonoscopy recommended given her age Mammogram: 04/2022. DEXA: 04/2022 WNL  CT lung 04/2020 Former smoker quit 2016, 50+ pack year hx - pulm is following  Prior vaccinations: TD or Tdap: 2013  Influenza: 08/2022 Pneumococcal: 2003, 2014 Prevnar13: 2017 Shingles/Zostavax: declines Covid 19: 3/3, 2021, pfizer + booster  Names of Other Physician/Practitioners you currently use: 1.  [facility] here for primary [facility] 2. [facility], eye doctor, last visit 2021, looking for new provider 3.  Dr. Stoney Bang, dentist, last visit 2022, q78m  Patient Care Team: Adela Glimpse, NP as PCP - General (Nurse Practitioner) Sallye Lat, MD as Consulting Physician (Optometry) Croitoru, Rachelle Hora, MD as Consulting Physician (Cardiology) Mardella Layman, MD as Consulting Physician (Gastroenterology) Heron Nay, MD (Gynecology) Cherlyn Roberts, MD as Consulting Physician (Dermatology) Josph Macho, MD as Consulting Physician (Oncology) Drema Halon, MD (Inactive) as Consulting Physician (Otolaryngology)  SURGICAL HISTORY She  has a past surgical history that includes Eye surgery; Abdominal hysterectomy; Colonoscopy; Direct laryngoscopy (N/A, 10/21/2014); Mass excision (Right, 10/21/2014); Excision nasal mass (Right, 10/21/2014); and Cesarean section (1969). FAMILY HISTORY Her family history includes Breast cancer in her maternal grandmother, sister, and sister; Cancer in her brother and brother; Heart attack in her father; Hypertension  in her mother; Lung cancer (age of onset: 47) in her sister; Other in her brother; Prostate cancer in her brother; Stroke in her mother. SOCIAL HISTORY She  reports that she quit smoking about 8 years ago. Her smoking use included cigarettes. She started smoking about 66 years ago. She has a 58.00 pack-year smoking history. She has never used smokeless tobacco. She reports that she does not drink alcohol and does not use drugs.   Review of Systems  Constitutional:  Negative for malaise/fatigue and weight loss.  HENT:  Negative for hearing loss and tinnitus.   Eyes:  Negative for blurred vision and double vision.  Respiratory:  Negative for cough, sputum production, shortness of breath and wheezing.   Cardiovascular:  Negative for chest pain, palpitations, orthopnea, claudication, leg swelling and PND.  Gastrointestinal:  Negative for abdominal pain, blood in stool, constipation, diarrhea, heartburn, melena, nausea and vomiting.  Genitourinary: Negative.   Musculoskeletal:  Negative for falls, joint pain and myalgias.  Skin:  Negative for rash.  Neurological:  Negative for dizziness, tingling, sensory change, weakness and headaches.  Endo/Heme/Allergies:  Negative for polydipsia.  Psychiatric/Behavioral: Negative.  Negative for depression, memory loss, substance abuse and suicidal ideas. The patient is not nervous/anxious and does not have insomnia.   All other systems reviewed and are negative.    Objective:     Today's Vitals   12/30/22 0953  BP: (!) 150/90  Pulse: 68  Temp: (!) 97.3 F (36.3 C)  SpO2: 99%  Weight: 146 lb 9.6 oz (66.5 kg)  Height: 5' 4.5" (1.638 m)   Body mass index is 24.78 kg/m.  General appearance: alert, no distress, WD/WN, female HEENT: normocephalic, sclerae anicteric, TMs pearly, nares patent, no discharge or erythema, pharynx normal Oral cavity: MMM, no lesions Neck: supple, no lymphadenopathy, no thyromegaly, no masses Heart: RRR, normal S1, S2, no  murmurs Lungs: CTA bilaterally, no wheezes, rhonchi, or rales Abdomen: +bs, soft, non tender, non distended, no masses, no hepatomegaly, no splenomegaly Musculoskeletal: nontender, no swelling, no obvious deformity, right great toe tender to palpation along lateral hallux valgus. Intact lumbar ROM without pain, neg straight leg raise, non-tender. L hip with mildly decreased external rotation without pain or crepitus.  Extremities: no edema, no cyanosis, no clubbing Pulses: 2+ symmetric, upper and lower extremities, normal cap refill Neurological: alert, oriented x 3, CN2-12 intact, strength normal upper extremities and lower extremities, sensation normal throughout, DTRs 2+ throughout, no cerebellar signs, gait normal Psychiatric: normal affect, behavior normal, pleasant   EKG:  SB with long QT otherwise normal   Arthur Aydelotte, NP   12/30/2022

## 2022-12-30 NOTE — Patient Instructions (Signed)
Gout  Gout is painful swelling of your joints. Gout is a type of arthritis. It is caused by having too much uric acid in your body. Uric acid is a chemical that is made when your body breaks down substances called purines. If your body has too much uric acid, sharp crystals can form and build up in your joints. This causes pain and swelling. Gout attacks can happen quickly and be very painful (acute gout). Over time, the attacks can affect more joints and happen more often (chronic gout). What are the causes? Gout is caused by too much uric acid in your blood. This can happen because: Your kidneys do not remove enough uric acid from your blood. Your body makes too much uric acid. You eat too many foods that are high in purines. These foods include organ meats, some seafood, and beer. Trauma or stress can bring on an attack. What increases the risk? Having a family history of gout. Being female and middle-aged. Being female and having gone through menopause. Having an organ transplant. Taking certain medicines. Having certain conditions, such as: Being very overweight (obese). Lead poisoning. Kidney disease. A skin condition called psoriasis. Other risks include: Losing weight too quickly. Not having enough water in the body (being dehydrated). Drinking alcohol, especially beer. Drinking beverages that are sweetened with a type of sugar called fructose. What are the signs or symptoms? An attack of acute gout often starts at night and usually happens in just one joint. The most common place is the big toe. Other joints that may be affected include joints of the feet, ankle, knee, fingers, wrist, or elbow. Symptoms may include: Very bad pain. Warmth. Swelling. Stiffness. Tenderness. The affected joint may be very painful to touch. Shiny, red, or purple skin. Chills and fever. Chronic gout may cause symptoms more often. More joints may be involved. You may also have white or yellow lumps  (tophi) on your hands or feet or in other areas near your joints. How is this treated? Treatment for an acute attack may include medicines for pain and swelling, such as: NSAIDs, such as ibuprofen. Steroids taken by mouth or injected into a joint. Colchicine. This can be given by mouth or through an IV tube. Treatment to prevent future attacks may include: Taking small doses of NSAIDs or colchicine daily. Using a medicine that reduces uric acid levels in your blood, such as allopurinol. Making changes to your diet. You may need to see a food expert (dietitian) about what to eat and drink to prevent gout. Follow these instructions at home: During a gout attack  If told, put ice on the painful area. To do this: Put ice in a plastic bag. Place a towel between your skin and the bag. Leave the ice on for 20 minutes, 2-3 times a day. Take off the ice if your skin turns bright red. This is very important. If you cannot feel pain, heat, or cold, you have a greater risk of damage to the area. Raise the painful joint above the level of your heart as often as you can. Rest the joint as much as possible. If the joint is in your leg, you may be given crutches. Follow instructions from your doctor about what you cannot eat or drink. Avoiding future gout attacks Eat a low-purine diet. Avoid foods and drinks such as: Liver. Kidney. Anchovies. Asparagus. Herring. Mushrooms. Mussels. Beer. Stay at a healthy weight. If you want to lose weight, talk with your doctor. Do not   lose weight too fast. Start or continue an exercise plan as told by your doctor. Eating and drinking Avoid drinks sweetened by fructose. Drink enough fluids to keep your pee (urine) pale yellow. If you drink alcohol: Limit how much you have to: 0-1 drink a day for women who are not pregnant. 0-2 drinks a day for men. Know how much alcohol is in a drink. In the [place] one drink equals one 12 oz bottle of beer (355 mL), one 5 oz  glass of wine (148 mL), or one 1 oz glass of hard liquor (44 mL). General instructions Take over-the-counter and prescription medicines only as told by your doctor. Ask your doctor if you should avoid driving or using machines while you are taking your medicine. Return to your normal activities when your doctor says that it is safe. Keep all follow-up visits. Where to find more information National Institutes of Health: www.niams.nih.gov Contact a doctor if: You have another gout attack. You still have symptoms of a gout attack after 10 days of treatment. You have problems (side effects) because of your medicines. You have chills or a fever. You have burning pain when you pee (urinate). You have pain in your lower back or belly. Get help right away if: You have very bad pain. Your pain cannot be controlled. You cannot pee. Summary Gout is painful swelling of the joints. The most common site of pain is the big toe, but it can affect other joints. Medicines and avoiding some foods can help to prevent and treat gout attacks. This information is not intended to replace advice given to you by your health care provider. Make sure you discuss any questions you have with your health care provider. Document Revised: 06/06/2021 Document Reviewed: 06/06/2021 Elsevier Patient Education  2023 [employer]  

## 2022-12-31 LAB — URINALYSIS, ROUTINE W REFLEX MICROSCOPIC
Bilirubin Urine: NEGATIVE
Glucose, UA: NEGATIVE
Hgb urine dipstick: NEGATIVE
Ketones, ur: NEGATIVE
Leukocytes,Ua: NEGATIVE
Nitrite: NEGATIVE
Protein, ur: NEGATIVE
Specific Gravity, Urine: 1.011 (ref 1.001–1.035)
pH: 6.5 (ref 5.0–8.0)

## 2022-12-31 LAB — CBC WITH DIFFERENTIAL/PLATELET
Absolute Monocytes: 509 cells/uL (ref 200–950)
Basophils Absolute: 18 cells/uL (ref 0–200)
Basophils Relative: 0.4 %
Eosinophils Absolute: 261 cells/uL (ref 15–500)
Eosinophils Relative: 5.8 %
HCT: 39.6 % (ref 35.0–45.0)
Hemoglobin: 13.2 g/dL (ref 11.7–15.5)
Lymphs Abs: 1323 cells/uL (ref 850–3900)
MCH: 29.1 pg (ref 27.0–33.0)
MCHC: 33.3 g/dL (ref 32.0–36.0)
MCV: 87.4 fL (ref 80.0–100.0)
MPV: 9.2 fL (ref 7.5–12.5)
Monocytes Relative: 11.3 %
Neutro Abs: 2390 cells/uL (ref 1500–7800)
Neutrophils Relative %: 53.1 %
Platelets: 379 10*3/uL (ref 140–400)
RBC: 4.53 10*6/uL (ref 3.80–5.10)
RDW: 14.1 % (ref 11.0–15.0)
Total Lymphocyte: 29.4 %
WBC: 4.5 10*3/uL (ref 3.8–10.8)

## 2022-12-31 LAB — LIPID PANEL
Cholesterol: 188 mg/dL (ref <200)
HDL: 90 mg/dL (ref >=50)
LDL Cholesterol (Calc): 85 mg/dL (calc)
Non-HDL Cholesterol (Calc): 98 mg/dL (calc) (ref <130)
Total CHOL/HDL Ratio: 2.1 (calc) (ref <5.0)
Triglycerides: 54 mg/dL (ref <150)

## 2022-12-31 LAB — COMPLETE METABOLIC PANEL WITH GFR
AG Ratio: 1.4 (calc) (ref 1.0–2.5)
ALT: 14 U/L (ref 6–29)
AST: 17 U/L (ref 10–35)
Albumin: 4.5 g/dL (ref 3.6–5.1)
Alkaline phosphatase (APISO): 63 U/L (ref 37–153)
BUN: 18 mg/dL (ref 7–25)
CO2: 28 mmol/L (ref 20–32)
Calcium: 10.9 mg/dL — ABNORMAL HIGH (ref 8.6–10.4)
Chloride: 101 mmol/L (ref 98–110)
Creat: 0.89 mg/dL (ref 0.60–0.95)
Globulin: 3.2 g/dL (calc) (ref 1.9–3.7)
Glucose, Bld: 101 mg/dL — ABNORMAL HIGH (ref 65–99)
Potassium: 3.9 mmol/L (ref 3.5–5.3)
Sodium: 141 mmol/L (ref 135–146)
Total Bilirubin: 0.4 mg/dL (ref 0.2–1.2)
Total Protein: 7.7 g/dL (ref 6.1–8.1)
eGFR: 65 mL/min/{1.73_m2} (ref >=60)

## 2022-12-31 LAB — HEMOGLOBIN A1C
Hgb A1c MFr Bld: 5.7 % of total Hgb — ABNORMAL HIGH (ref <5.7)
Mean Plasma Glucose: 117 mg/dL
eAG (mmol/L): 6.5 mmol/L

## 2022-12-31 LAB — TSH: TSH: 0.75 mIU/L (ref 0.40–4.50)

## 2022-12-31 LAB — MICROALBUMIN / CREATININE URINE RATIO
Creatinine, Urine: 46 mg/dL (ref 20–275)
Microalb Creat Ratio: 41 mg/g creat — ABNORMAL HIGH (ref <30)
Microalb, Ur: 1.9 mg/dL

## 2022-12-31 LAB — INSULIN, RANDOM: Insulin: 10 u[IU]/mL

## 2022-12-31 LAB — MAGNESIUM: Magnesium: 2.2 mg/dL (ref 1.5–2.5)

## 2022-12-31 LAB — URIC ACID: Uric Acid, Serum: 6.4 mg/dL (ref 2.5–7.0)

## 2022-12-31 LAB — VITAMIN D 25 HYDROXY (VIT D DEFICIENCY, FRACTURES): Vit D, 25-Hydroxy: 41 ng/mL (ref 30–100)

## 2023-01-15 ENCOUNTER — Other Ambulatory Visit: Payer: Self-pay | Admitting: Gastroenterology

## 2023-01-15 DIAGNOSIS — G8929 Other chronic pain: Secondary | ICD-10-CM

## 2023-01-30 ENCOUNTER — Other Ambulatory Visit: Payer: Self-pay | Admitting: Gastroenterology

## 2023-01-30 DIAGNOSIS — G8929 Other chronic pain: Secondary | ICD-10-CM

## 2023-02-05 ENCOUNTER — Other Ambulatory Visit: Payer: Self-pay | Admitting: Gastroenterology

## 2023-02-05 DIAGNOSIS — G8929 Other chronic pain: Secondary | ICD-10-CM

## 2023-02-07 ENCOUNTER — Ambulatory Visit: Payer: Medicare PPO | Admitting: Gastroenterology

## 2023-02-07 ENCOUNTER — Encounter: Payer: Self-pay | Admitting: Gastroenterology

## 2023-02-07 VITALS — BP 138/80 | HR 70 | Ht 66.0 in | Wt 148.0 lb

## 2023-02-07 DIAGNOSIS — K581 Irritable bowel syndrome with constipation: Secondary | ICD-10-CM

## 2023-02-07 DIAGNOSIS — R143 Flatulence: Secondary | ICD-10-CM | POA: Diagnosis not present

## 2023-02-07 DIAGNOSIS — K219 Gastro-esophageal reflux disease without esophagitis: Secondary | ICD-10-CM

## 2023-02-07 MED ORDER — PANTOPRAZOLE SODIUM 40 MG PO TBEC
40.0000 mg | DELAYED_RELEASE_TABLET | Freq: Every day | ORAL | 5 refills | Status: DC
Start: 2023-02-07 — End: 2024-04-23

## 2023-02-07 NOTE — Patient Instructions (Addendum)
We have sent the following medications to your pharmacy for you to pick up at your convenience:  Pantoprazole 40 MG - 1 Tablet daily.  Follow up as needed. ______________________________________________________  If your blood pressure at your visit was 140/90 or greater, please contact your primary care physician to follow up on this.  _______________________________________________________  If you are age 82 or older, your body mass index should be between 23-30. Your Body mass index is 23.89 kg/m. If this is out of the aforementioned range listed, please consider follow up with your Primary Care Provider. _________________________________________________________  The [facility] providers would like to encourage you to use [facility] to communicate with providers for non-urgent requests or questions.  Due to long hold times on the telephone, sending your provider a message by [facility] may be a faster and more efficient way to get a response.  Please allow 48 business hours for a response.  Please remember that this is for non-urgent requests.   Due to recent changes in healthcare laws, you may see the results of your imaging and laboratory studies on MyChart before your provider has had a chance to review them.  We understand that in some cases there may be results that are confusing or concerning to you. Not all laboratory results come back in the same time frame and the provider may be waiting for multiple results in order to interpret others.  Please give [place] 48 hours in order for your provider to thoroughly review all the results before contacting the office for clarification of your results.    Thank you for choosing me and  Gastroenterology.  Vito Cirigliano, D.O.

## 2023-02-07 NOTE — Progress Notes (Unsigned)
Chief Complaint:    Medication refill, GERD, increased gas  GI History: 82 year old female with a history of ILD, mild COPD, bronchiectasis, hyperparathyroidism, HTN arthritis, CKD 3, osteopenia, diverticulosis, colon polyps.   Endoscopic History: - 2012: Colonoscopy: Left-sided diverticulosis with tight sigmoid lumen and associated stenosis.  Otherwise normal - 07/04/2021: EGD: Mild size HH, non-H. pylori gastritis, normal duodenum with normal biopsies - 07/04/2021: Colonoscopy: Internal/external hemorrhoids, pancolonic diverticulosis, small tubular adenoma  HPI:     Patient is a 82 y.o. female presenting to the [facility]enterology [facility] for follow-up.  Previously followed with Dr. Orvan Falconer, and now transferring her care to me.  Was last seen by Dr. Orvan Falconer on 09/19/2021.  Main issue at that time was RLQ pain and altered bowel habits without clear etiology despite evaluation with EGD, colonoscopy, CT (although did show T12 compression fracture which may be contributing to her pain).  Was recommended to continue high-dose Protonix, add daily MiraLAX, Benefiber, RTC as needed.  Main issue today is increased gas production over the last 2-3 weeks. Increased flatus. No associated abdominal pain, and no change from her baseline constipation. Constipation is o/w well controlled with dietary modifications and prn Miralax (takes about once/week).  Also requesting refill of pantoprazole. Reflux o/w well controlled.   Follows in the Endocrinology Clinic for hyperparathyroidism and hypercalcemia.  Was last seen by her PCM on 12/30/2022 for routine follow-up.     Latest Ref Rng & Units 12/30/2022   11:05 AM 11/05/2022    9:24 AM 04/09/2022   12:02 PM  CMP  Glucose 65 - 99 mg/dL 161  98  93   BUN 7 - 25 mg/dL 18  16  15    Creatinine 0.60 - 0.95 mg/dL 0.96  0.45  4.09   Sodium 135 - 146 mmol/L 141  139  142   Potassium 3.5 - 5.3 mmol/L 3.9  3.3  3.7   Chloride 98 - 110 mmol/L 101  101  104   CO2 20 -  32 mmol/L 28  29  28    Calcium 8.6 - 10.4 mg/dL 81.1  91.4  78.2   Total Protein 6.1 - 8.1 g/dL 7.7   7.4   Total Bilirubin 0.2 - 1.2 mg/dL 0.4   0.4   AST 10 - 35 U/L 17   18   ALT 6 - 29 U/L 14   17        Latest Ref Rng & Units 12/30/2022   11:05 AM 04/09/2022   12:02 PM 12/05/2021    4:00 PM  CBC  WBC 3.8 - 10.8 Thousand/uL 4.5  4.1  5.0   Hemoglobin 11.7 - 15.5 g/dL 95.6  21.3  08.6   Hematocrit 35.0 - 45.0 % 39.6  38.9  38.0   Platelets 140 - 400 Thousand/uL 379  334  355     Review of systems:     No chest pain, no SOB, no fevers, no urinary sx   Past Medical History:  Diagnosis Date   Allergic rhinitis    Arthritis    Bronchiectasis (HCC)    Colon polyps    Diverticulosis    Emphysema of lung (HCC)    GERD (gastroesophageal reflux disease)    HSV-1 (herpes simplex virus 1) infection    Hyperparathyroidism (HCC)    Hypertension    Pulmonary nodule 07/10/2017   CT 01/2017, CT 07/2017 - stable on follow up, no further follow up recommended.    Patient's surgical history, family medical history, social  history, medications and allergies were all reviewed in Epic    Current Outpatient Medications  Medication Sig Dispense Refill   acetaminophen (TYLENOL) 500 MG tablet Take 500 mg by mouth every 6 (six) hours as needed (pain).     amLODipine (NORVASC) 10 MG tablet TAKE 1 TABLET BY MOUTH EVERY DAY FOR BLOOD PRESSURE 90 tablet 3   Ascorbic Acid (VITAMIN C) 100 MG tablet Take 100 mg by mouth daily.     azelastine (ASTELIN) 0.1 % nasal spray      famotidine (PEPCID) 40 MG tablet TAKE 1 TABLET DAILY TO PREVENT HEARTBURN & INDIGESTION 90 tablet 3   halobetasol (ULTRAVATE) 0.05 % cream Apply topically 2 (two) times daily. Apply to Eczema Rash 2 x/day 100 g 11   hydrocortisone 2.5 % cream      Magnesium 250 MG TABS Take 1 tablet (250 mg total) by mouth daily. 30 tablet 0   Naphazoline HCl (CLEAR EYES OP) Place 1 drop into both eyes daily.     nitroGLYCERIN (NITROSTAT) 0.4 MG  SL tablet Place 0.4 mg under the tongue every 5 (five) minutes as needed for chest pain.     OVER THE COUNTER MEDICATION Takes OTC eczema cream PRN.     rosuvastatin (CRESTOR) 5 MG tablet TAKE 1 TAB IN THE EVENING 5 DAYS A WEEK FOR CHOLESTEROL TO REDUCE HEART ATTACK/STROKE RISK 60 tablet 1   triamterene-hydrochlorothiazide (MAXZIDE-25) 37.5-25 MG tablet TAKE 1 TABLET DAILY FOR BP & FLUID 90 tablet 3   Wheat Dextrin (BENEFIBER PO) Take 1 Dose by mouth daily.     pantoprazole (PROTONIX) 40 MG tablet TAKE 1 TABLET BY MOUTH TWICE A DAY (Patient not taking: Reported on 02/07/2023) 180 tablet 0   No current facility-administered medications for this visit.    Physical Exam:     BP 138/80   Pulse 70   Ht 5\' 6"  (1.676 m)   Wt 148 lb (67.1 kg)   BMI 23.89 kg/m   GENERAL:  Pleasant female in NAD PSYCH: : Cooperative, normal affect CARDIAC:  RRR, no murmur heard, no peripheral edema PULM: Normal respiratory effort, lungs CTA bilaterally, no wheezing ABDOMEN:  Nondistended, soft, nontender. No obvious masses, no hepatomegaly,  normal bowel sounds SKIN:  turgor, no lesions seen Musculoskeletal:  Normal muscle tone, normal strength NEURO: Alert and oriented x 3, no focal neurologic deficits   IMPRESSION and PLAN:    1) GERD Reflux well-controlled on current therapy.  Not clear that she continues to require high-dose PPI therapy. Plan for the following: - Will reduce Protonix to 40 mg daily to see if high dose no longer needed. If still no reflux sxs, will reduce to 20 mg daily - Provided with Rx for Protonix -Continue antireflux lifestyle/dietary modifications with avoidance of exacerbating foods  2) Increased flatus 3) IBS-Constipation Recent increase in flatus for the last 2-3 weeks.  Suspect dietary related along with her underlying chronic constipation.  Otherwise no recent changes in medications, hospitalization, antibiotics, etc. - Cannot trial Gas-X, Beano, etc. as they all have  calcium carbonate (hypercalcemia) - Diet diary with dietary modifications discussed - Will try to increase MiraLAX to twice weekly and monitor for symptomatic improvement  RTC prn           Shellia Cleverly ,DO, FACG 02/07/2023, 8:31 AM

## 2023-03-13 ENCOUNTER — Ambulatory Visit: Payer: Medicare PPO | Admitting: Nurse Practitioner

## 2023-04-02 ENCOUNTER — Ambulatory Visit: Payer: Medicare PPO | Admitting: Nurse Practitioner

## 2023-04-07 ENCOUNTER — Encounter: Payer: Self-pay | Admitting: Nurse Practitioner

## 2023-04-08 NOTE — Progress Notes (Unsigned)
MEDICARE AND FOLLOW UP  Assessment:   Diagnoses and all orders for this visit:  Annual Medicare Wellness Visit Due annually   Essential hypertension Controlled  Continue medications;  Discussed DASH (Dietary Approaches to Stop Hypertension) DASH diet is lower in sodium than a typical American diet. Cut back on foods that are high in saturated fat, cholesterol, and trans fats. Eat more whole-grain foods, fish, poultry, and nuts Remain active and exercise as tolerated daily.  Monitor BP at home-Call if greater than 130/80.  Check CBC/CMP   Aortic atherosclerosis (HCC) Controlled Continue medications;  Discussed lifestyle modifications. Recommended diet heavy in fruits and veggies, omega 3's. Decrease consumption of animal meats, cheeses, and dairy products. Remain active and exercise as tolerated. Continue to monitor. Check lipids   ILD (interstitial lung disease) (HCC) Followed by Pulmonology once yearly  She is due to see in 04/2022 No recent flares or complications. Continue to monitor   COPD, mild (HCC) Followed by Pulmonology once yearly  She is due to see in 04/2022 No recent flares or complications. Continue to monitor  Chronic seasonal allergic rhinitis Controlled. No recent flare. Antihistamine OTC PRN Avoid triggers  Bronchiectasis without acute exacerbation (HCC) Followed by Pulmonology once yearly  She is due to see in 04/2022 No recent flares or complications. Continue to monitor  Gastroesophageal reflux disease, esophagitis presence not specified No suspected reflux complications (Barret/stricture). Lifestyle modification:  wt loss, avoid meals 2-3h before bedtime. Consider eliminating food triggers:  chocolate, caffeine, EtOH, acid/spicy food.  Completed colonoscopy Continue to monitor  Diverticulosis Increase fiber and fluids Avoid greasy and fatty foods when flared.  Arthritis Tylenol PRN Can take up to 3000 mg in 24 hours. Rest  when flared.   CKD (chronic kidney disease) stage 3, GFR 30-59 ml/min (HCC) Discussed how what you eat and drink can aide in kidney protection. Stay well hydrated. Avoid high salt foods. Avoid NSAIDS. Keep BP and BG well controlled.   Take medications as prescribed. Remain active and exercise as tolerated daily. Maintain weight.  Continue to monitor. Check GFR/CMP  Vitamin D deficiency Continue supplementation Check vitamin D level Dexa scan ordered.  Other abnormal glucose Education: Reviewed 'ABCs' of diabetes management  A1C (<7):  Goal Met. Blood pressure (<130/80):  Goal Met Cholesterol (LDL <70):  Goal Met Continue Eye Exam yearly  Continue Dental Exam Q6 mo Discussed dietary recommendations Check A1C  Medication management All medications discussed and reviewed in full. All questions and concerns regarding medications addressed.     HSV-1 (herpes simplex virus 1) infection Antiviral PRN  Dyslipidemia Controlled Continue medications;  Discussed lifestyle modifications. Recommended diet heavy in fruits and veggies, omega 3's. Decrease consumption of animal meats, cheeses, and dairy products. Remain active and exercise as tolerated. Continue to monitor.   Osteopenia Pursue a combination of weight-bearing exercises and strength training. Advised on fall prevention measures including proper lighting in all rooms, removal of area rugs and floor clutter, use of walking devices as deemed appropriate, avoidance of uneven walking surfaces. Consume 800 to 1000 IU of vitamin D daily with a goal vitamin D serum value of 30 ng/mL or higher. Aim for 1000 to 1200 mg of elemental calcium daily through supplements and/or dietary sources. Complete Dexa.   Abdominal pain/ Polyp of colon, unspecified part of colon, unspecified type Suggest fiber supplement, increase water, increase exercise  Pain of left Clavicle. Increase Tylenol dosage. Can take up to 3,000 mg in 24  hours. Rest when flared. Continue to monitor  No orders of the defined types were placed in this encounter.   Over 40 minutes of exam, counseling, chart review and critical decision making was performed Future Appointments  Date Time Provider Department Center  04/09/2023 10:30 AM Adela Glimpse, NP [facility] None  11/06/2023  9:30 AM Shamleffer, Konrad Dolores, MD [facility] None  12/30/2023 10:00 AM Adela Glimpse, NP [facility] None  04/09/2024 10:30 AM Adela Glimpse, NP [facility] None     Plan:   During the course of the visit the patient was educated and counseled about appropriate screening and preventive services including:   Pneumococcal vaccine  Prevnar 13 Influenza vaccine Td vaccine Screening electrocardiogram Bone densitometry screening Colorectal cancer screening Diabetes screening Glaucoma screening Nutrition counseling  Advanced directives: requested   Subjective:  Catherine Parks is a 82 y.o. female who presents for AWV and 3 month follow up. She has Essential hypertension; HSV-1 (herpes simplex virus 1) infection; Diverticulosis; History of colon polyps; Vitamin D deficiency; CKD (chronic kidney disease) stage 2, GFR 60-89 ml/min; Medication management; GERD ; COPD, mild (HCC); Arthritis; Chronic seasonal allergic rhinitis; ILD (interstitial lung disease) (HCC); Aortic atherosclerosis (HCC); Abnormal EKG; Former smoker; Abnormal glucose (prediabetes); Hyperlipidemia, mixed; Osteopenia; Hypercalcemia; Chronic lumbar pain; T12 compression fracture (HCC); Hiatal hernia; Hypervitaminosis D; Primary hyperparathyroidism (HCC); and Hypokalemia on their problem list.  Overall she reports feeling well.  She complains of intermittent left clavicle pain.  No know recent injury or fall.  Has tried 500 mg Tylenol with some improvement.  Has DEXA due this year.  Hx of osteopenia.    Patient has been followed by Dr Marchelle Gearing for COPD/ILD and a 6 mm LLL nodule by CT  scans which has remained stable in size since 1st discovered in May 2018.  Follows with pulm annually, getting low dose screening CT due to 58 pack year smoking hx, quit in 2016. Some emphysematous changes, ILD. ECHO 06/2020 was benign without evidence of PAH.    Patient has hx/o GERD stable by lifestyle & famotidine.  She has hx of colon polyps, diverticulosis, last colonoscopy ; 06/2021 with no recall.  She reported having more constipation (intermittent, normal calibur), lower abdominal discomfort, she was advised fiber, miralax, fluid intake (reports improved with fiber) and she was referred to GI Dr. Orvan Falconer for routine colonoscopy but hasn't heard back to schedule. She thinks she now has the occasional diverticulitis flare.  She walks around to keep bowels active and takes tylenol to ease the pain.  Most days this helps.  She has lumbar pain with moderate spondylosis on xray 11/2020, manging well with PRN tylenol.   BMI is There is no height or weight on file to calculate BMI., she has been working on diet, not exercising since covid 19 due to pulm conditions and restrictions, [facility] Too hot  Wt Readings from Last 3 Encounters:  02/07/23 148 lb (67.1 kg)  12/30/22 146 lb 9.6 oz (66.5 kg)  11/05/22 145 lb (65.8 kg)   She has aortic atherosclerosis per CT 03/2019 Had stress test and ECHO in 12/2017 following chest/jaw pain episode which were unremarkable -    Her blood pressure has been controlled at home (110-120s/70s), today their BP is  , will take clonidine 0.2 mg PRN, typically once a week for labile BPs She does not workout. She denies chest pain, shortness of breath, dizziness.   She is on cholesterol medication (rosuvastatin 5 mg 3 days a week) and denies myalgias.  Her cholesterol is not at goal. She is doing plant based, no dairy, low fat poultry. The cholesterol last visit was:   Lab Results  Component Value Date   CHOL 188 12/30/2022   HDL  90 12/30/2022   LDLCALC 85 12/30/2022   TRIG 54 12/30/2022   CHOLHDL 2.1 12/30/2022    She has been working on diet and exercise for glucose management, and denies foot ulcerations, increased appetite, nausea, paresthesia of the feet, polydipsia, polyuria, visual disturbances, vomiting and weight loss.  Last A1C in the office was:  Lab Results  Component Value Date   HGBA1C 5.7 (H) 12/30/2022   Last GFR: Lab Results  Component Value Date   GFRAA 70 03/09/2021   Patient is on Vitamin D supplement and at goal at recent check:    Lab Results  Component Value Date   VD25OH 41 12/30/2022     Has had elevated calcium, denies supplement or tums.  Lab Results  Component Value Date   PTH 44 11/05/2022   CALCIUM 10.9 (H) 12/30/2022      Medication Review: Current Outpatient Medications on File Prior to Visit  Medication Sig Dispense Refill   acetaminophen (TYLENOL) 500 MG tablet Take 500 mg by mouth every 6 (six) hours as needed (pain).     amLODipine (NORVASC) 10 MG tablet TAKE 1 TABLET BY MOUTH EVERY DAY FOR BLOOD PRESSURE 90 tablet 3   Ascorbic Acid (VITAMIN C) 100 MG tablet Take 100 mg by mouth daily.     azelastine (ASTELIN) 0.1 % nasal spray      famotidine (PEPCID) 40 MG tablet TAKE 1 TABLET DAILY TO PREVENT HEARTBURN & INDIGESTION 90 tablet 3   halobetasol (ULTRAVATE) 0.05 % cream Apply topically 2 (two) times daily. Apply to Eczema Rash 2 x/day 100 g 11   hydrocortisone 2.5 % cream      Magnesium 250 MG TABS Take 1 tablet (250 mg total) by mouth daily. 30 tablet 0   Naphazoline HCl (CLEAR EYES OP) Place 1 drop into both eyes daily.     nitroGLYCERIN (NITROSTAT) 0.4 MG SL tablet Place 0.4 mg under the tongue every 5 (five) minutes as needed for chest pain.     OVER THE COUNTER MEDICATION Takes OTC eczema cream PRN.     pantoprazole (PROTONIX) 40 MG tablet Take 1 tablet (40 mg total) by mouth daily. 90 tablet 5   rosuvastatin (CRESTOR) 5 MG tablet TAKE 1 TAB IN THE EVENING 5  DAYS A WEEK FOR CHOLESTEROL TO REDUCE HEART ATTACK/STROKE RISK 60 tablet 1   triamterene-hydrochlorothiazide (MAXZIDE-25) 37.5-25 MG tablet TAKE 1 TABLET DAILY FOR BP & FLUID 90 tablet 3   Wheat Dextrin (BENEFIBER PO) Take 1 Dose by mouth daily.     No current facility-administered medications on file prior to visit.    Allergies  Allergen Reactions   Ace Inhibitors Cough   Lemon Oil Hives    Reaction to lemons   Natural Vegetable Orange [Psyllium]     oranges   Shellfish Allergy Swelling and Hives    Current Problems (verified) Patient Active Problem List   Diagnosis Date Noted   Hypokalemia 11/06/2022   Hypervitaminosis D 10/31/2021   Primary hyperparathyroidism (HCC) 10/31/2021   Hiatal hernia 10/16/2021   T12 compression fracture (HCC) 10/12/2021   Chronic lumbar pain 12/07/2020   Hypercalcemia 12/06/2020   Osteopenia 12/05/2020   Former smoker 05/08/2018   Abnormal glucose (prediabetes) 05/08/2018   Hyperlipidemia, mixed 05/08/2018   Abnormal EKG 12/22/2017  Aortic atherosclerosis (HCC) 07/10/2017   ILD (interstitial lung disease) (HCC) 09/27/2016   Arthritis 08/02/2016   Chronic seasonal allergic rhinitis 08/02/2016   COPD, mild (HCC) 06/07/2016   GERD  01/05/2015   CKD (chronic kidney disease) stage 2, GFR 60-89 ml/min 06/08/2014   Medication management 06/08/2014   Vitamin D deficiency 08/19/2013   Essential hypertension    HSV-1 (herpes simplex virus 1) infection    Diverticulosis    History of colon polyps     Screening Tests Immunization History  Administered Date(s) Administered   Covid-19, Mrna,Vaccine(Spikevax)15yrs and older 09/04/2022   DTaP 07/30/2011   Fluad Quad(high Dose 65+) 08/25/2020   Influenza, High Dose Seasonal PF 06/07/2016, 06/25/2017, 09/22/2018, 07/19/2019, 07/25/2021   Influenza-Unspecified 08/02/2022   PFIZER(Purple Top)SARS-COV-2 Vaccination 10/05/2019, 10/25/2019, 08/25/2020   Pfizer Covid-19 Vaccine Bivalent Booster 63yrs &  up 07/21/2021   Pneumococcal Conjugate-13 11/22/2015   Pneumococcal Polysaccharide-23 05/11/2013   Pneumococcal-Unspecified 01/07/2002   Respiratory Syncytial Virus Vaccine,Recomb Aduvanted(Arexvy) 09/04/2022   Td 01/07/2002, 05/07/2012   Preventative care: Last colonoscopy: 06/2021 No surveillance colonoscopy recommended given her age Mammogram: 04/2014/2022 Negative Due 04/2022 - she has scheduled. DEXA: 04/2019 L fem T-1.3, 04/2019 L fem T -1.4, ordered to schedule [facility]  CT lung 04/2020 Former smoker quit 2016, 50+ pack year hx - pulm is following  Prior vaccinations: TD or Tdap: 2013  Influenza: 08/2021 Pneumococcal: 2003, 2014 Prevnar13: 2017 Shingles/Zostavax: declines Covid 19: 3/3, 2021, pfizer + booster  Names of Other Physician/Practitioners you currently use: 1. [facility] here for primary care 2. [facility], eye doctor, last visit 2021, looking for new provider 3. Dr. Stoney Bang, dentist, last visit 2022, q69m  Patient Care Team: Adela Glimpse, NP as PCP - General (Nurse Practitioner) Sallye Lat, MD as Consulting Physician (Optometry) Croitoru, Rachelle Hora, MD as Consulting Physician (Cardiology) Mardella Layman, MD as Consulting Physician (Gastroenterology) Heron Nay, MD (Gynecology) Cherlyn Roberts, MD as Consulting Physician (Dermatology) Josph Macho, MD as Consulting Physician (Oncology) Drema Halon, MD (Inactive) as Consulting Physician (Otolaryngology)  SURGICAL HISTORY She  has a past surgical history that includes Eye surgery; Abdominal hysterectomy; Colonoscopy; Direct laryngoscopy (N/A, 10/21/2014); Mass excision (Right, 10/21/2014); Excision nasal mass (Right, 10/21/2014); and Cesarean section (1969). FAMILY HISTORY Her family history includes Breast cancer in her maternal grandmother, sister, and sister; Cancer in her brother and brother; Heart attack in her father; Hypertension in her  mother; Lung cancer (age of onset: 88) in her sister; Other in her brother; Prostate cancer in her brother; Stroke in her mother. SOCIAL HISTORY She  reports that she quit smoking about 8 years ago. Her smoking use included cigarettes. She started smoking about 66 years ago. She has a 58 pack-year smoking history. She has never used smokeless tobacco. She reports that she does not drink alcohol and does not use drugs.  MEDICARE WELLNESS OBJECTIVES: Physical activity:   Cardiac risk factors:   Depression/mood screen:      04/09/2022    1:06 PM  Depression screen PHQ 2/9  Decreased Interest 0  Down, Depressed, Hopeless 0  PHQ - 2 Score 0    ADLs:     04/09/2022    1:05 PM  In your present state of health, do you have any difficulty performing the following activities:  Hearing? 0  Vision? 0  Difficulty concentrating or making decisions? 0  Walking or climbing stairs? 0  Dressing or bathing? 0  Doing errands, shopping? 0  Preparing Food  and eating ? N  Using the Toilet? N  In the past six months, have you accidently leaked urine? N  Do you have problems with loss of bowel control? N  Managing your Medications? N  Managing your Finances? N  Housekeeping or managing your Housekeeping? N     Cognitive Testing  Alert? Yes  Normal Appearance?Yes  Oriented to person? Yes  Place? Yes   Time? Yes  Recall of three objects?  Yes  Can perform simple calculations? Yes  Displays appropriate judgment?Yes  Can read the correct time from a watch face?Yes  EOL planning:       Review of Systems  Constitutional:  Negative for malaise/fatigue and weight loss.  HENT:  Negative for hearing loss and tinnitus.   Eyes:  Negative for blurred vision and double vision.  Respiratory:  Negative for cough, sputum production, shortness of breath and wheezing.   Cardiovascular:  Negative for chest pain, palpitations, orthopnea, claudication, leg swelling and PND.  Gastrointestinal:  Positive for  abdominal pain (mild lower abdominal discomfort, intermittent) and blood in stool (single episode last week ). Negative for constipation (intermittent, very hard stools), diarrhea, heartburn, melena, nausea and vomiting.  Genitourinary: Negative.   Musculoskeletal:  Negative for falls, joint pain and myalgias.  Skin:  Negative for rash.  Neurological:  Negative for dizziness, tingling, sensory change, weakness and headaches.  Endo/Heme/Allergies:  Negative for polydipsia.  Psychiatric/Behavioral: Negative.  Negative for depression, memory loss, substance abuse and suicidal ideas. The patient is not nervous/anxious and does not have insomnia.   All other systems reviewed and are negative.    Objective:     There were no vitals filed for this visit.  There is no height or weight on file to calculate BMI.  General appearance: alert, no distress, WD/WN, female HEENT: normocephalic, sclerae anicteric, TMs pearly, nares patent, no discharge or erythema, pharynx normal Oral cavity: MMM, no lesions Neck: supple, no lymphadenopathy, no thyromegaly, no masses Heart: RRR, normal S1, S2, no murmurs Lungs: CTA bilaterally, no wheezes, rhonchi, or rales Abdomen: +bs, soft, non tender, non distended, no masses, no hepatomegaly, no splenomegaly Musculoskeletal: nontender, no swelling, no obvious deformity. Intact lumbar ROM without pain, neg straight leg raise, non-tender. L hip with mildly decreased external rotation without pain or crepitus.  Extremities: no edema, no cyanosis, no clubbing Pulses: 2+ symmetric, upper and lower extremities, normal cap refill Neurological: alert, oriented x 3, CN2-12 intact, strength normal upper extremities and lower extremities, sensation normal throughout, DTRs 2+ throughout, no cerebellar signs, gait normal Psychiatric: normal affect, behavior normal, pleasant    Medicare Attestation I have personally reviewed: The patient's medical and social history Their use  of alcohol, tobacco or illicit drugs Their current medications and supplements The patient's functional ability including ADLs,fall risks, home safety risks, cognitive, and hearing and visual impairment Diet and physical activities Evidence for depression or mood disorders  The patient's weight, height, BMI, and visual acuity have been recorded in the chart.  I have made referrals, counseling, and provided education to the patient based on review of the above and I have provided the patient with a written personalized care plan for preventive services.     Adela Glimpse, NP   04/08/2023

## 2023-04-09 ENCOUNTER — Ambulatory Visit: Payer: Medicare PPO | Admitting: Nurse Practitioner

## 2023-04-09 ENCOUNTER — Encounter: Payer: Self-pay | Admitting: Nurse Practitioner

## 2023-04-09 VITALS — BP 136/72 | HR 71 | Temp 97.8°F | Ht 66.0 in | Wt 145.6 lb

## 2023-04-09 DIAGNOSIS — Z0000 Encounter for general adult medical examination without abnormal findings: Secondary | ICD-10-CM

## 2023-04-09 DIAGNOSIS — R7309 Other abnormal glucose: Secondary | ICD-10-CM | POA: Diagnosis not present

## 2023-04-09 DIAGNOSIS — Z86010 Personal history of colonic polyps: Secondary | ICD-10-CM

## 2023-04-09 DIAGNOSIS — J849 Interstitial pulmonary disease, unspecified: Secondary | ICD-10-CM | POA: Diagnosis not present

## 2023-04-09 DIAGNOSIS — I10 Essential (primary) hypertension: Secondary | ICD-10-CM | POA: Diagnosis not present

## 2023-04-09 DIAGNOSIS — M8580 Other specified disorders of bone density and structure, unspecified site: Secondary | ICD-10-CM | POA: Diagnosis not present

## 2023-04-09 DIAGNOSIS — J302 Other seasonal allergic rhinitis: Secondary | ICD-10-CM

## 2023-04-09 DIAGNOSIS — Z0001 Encounter for general adult medical examination with abnormal findings: Secondary | ICD-10-CM

## 2023-04-09 DIAGNOSIS — J479 Bronchiectasis, uncomplicated: Secondary | ICD-10-CM

## 2023-04-09 DIAGNOSIS — I700 Atherosclerosis of aorta: Secondary | ICD-10-CM

## 2023-04-09 DIAGNOSIS — R6889 Other general symptoms and signs: Secondary | ICD-10-CM | POA: Diagnosis not present

## 2023-04-09 DIAGNOSIS — J449 Chronic obstructive pulmonary disease, unspecified: Secondary | ICD-10-CM

## 2023-04-09 DIAGNOSIS — N182 Chronic kidney disease, stage 2 (mild): Secondary | ICD-10-CM

## 2023-04-09 DIAGNOSIS — E559 Vitamin D deficiency, unspecified: Secondary | ICD-10-CM

## 2023-04-09 DIAGNOSIS — B009 Herpesviral infection, unspecified: Secondary | ICD-10-CM

## 2023-04-09 DIAGNOSIS — E785 Hyperlipidemia, unspecified: Secondary | ICD-10-CM

## 2023-04-09 DIAGNOSIS — K5790 Diverticulosis of intestine, part unspecified, without perforation or abscess without bleeding: Secondary | ICD-10-CM

## 2023-04-09 DIAGNOSIS — Z79899 Other long term (current) drug therapy: Secondary | ICD-10-CM | POA: Diagnosis not present

## 2023-04-09 DIAGNOSIS — K219 Gastro-esophageal reflux disease without esophagitis: Secondary | ICD-10-CM

## 2023-04-09 DIAGNOSIS — M1990 Unspecified osteoarthritis, unspecified site: Secondary | ICD-10-CM

## 2023-04-09 LAB — CBC WITH DIFFERENTIAL/PLATELET
Absolute Monocytes: 643 cells/uL (ref 200–950)
Basophils Absolute: 31 cells/uL (ref 0–200)
Basophils Relative: 0.6 %
Eosinophils Absolute: 230 cells/uL (ref 15–500)
Eosinophils Relative: 4.5 %
HCT: 38.8 % (ref 35.0–45.0)
Hemoglobin: 12.9 g/dL (ref 11.7–15.5)
Lymphs Abs: 1362 cells/uL (ref 850–3900)
MCH: 29.2 pg (ref 27.0–33.0)
MCHC: 33.2 g/dL (ref 32.0–36.0)
MCV: 87.8 fL (ref 80.0–100.0)
MPV: 9.2 fL (ref 7.5–12.5)
Neutro Abs: 2836 cells/uL (ref 1500–7800)
Neutrophils Relative %: 55.6 %
Platelets: 398 10*3/uL (ref 140–400)
RBC: 4.42 10*6/uL (ref 3.80–5.10)
Total Lymphocyte: 26.7 %
WBC: 5.1 10*3/uL (ref 3.8–10.8)

## 2023-04-09 NOTE — Patient Instructions (Signed)
Abdominal Bloating When you have abdominal bloating, your abdomen may feel full, tight, or painful. It may also look bigger than normal or swollen (distended). Common causes of abdominal bloating include: Swallowing air. Constipation. Problems digesting food. Eating too much. Irritable bowel syndrome. This is a condition that affects the large intestine. Lactose intolerance. This is an inability to digest lactose, a natural sugar in dairy products. Celiac disease. This is a condition that affects the ability to digest gluten, a protein found in some grains. Gastroparesis. This is a condition that slows down the movement of food in the stomach and small intestine. It is more common in people with diabetes mellitus. Gastroesophageal reflux disease (GERD). This is a condition that makes stomach acid flow back into the esophagus. Urinary retention. This means that the body is holding onto urine, and the bladder cannot be emptied all the way. Follow these instructions at home: Eating and drinking Avoid eating too much. Try not to swallow air while talking or eating. Avoid eating while lying down. Avoid these foods and drinks: Foods that cause gas, such as broccoli, cabbage, cauliflower, and baked beans. Carbonated drinks. Hard candy. Chewing gum. Medicines Take over-the-counter and prescription medicines only as told by your health care provider. Take probiotic medicines. These medicines contain live bacteria or yeasts that can help digestion. Take coated peppermint oil capsules. General instructions Try to exercise regularly. Exercise may help to relieve bloating that is caused by gas and relieve constipation. Keep all follow-up visits. This is important. Contact a health care provider if: You have nausea and vomiting. You have diarrhea. You have abdominal pain. You have unusual weight loss or weight gain. You have severe pain, and medicines do not help. Get help right away if: You  have chest pain. You have trouble breathing. You have shortness of breath. You have trouble urinating. You have darker urine than normal. You have blood in your stools or have dark, tarry stools. These symptoms may represent a serious problem that is an emergency. Do not wait to see if the symptoms will go away. Get medical help right away. Call your local emergency services (911 in the [place] Do not drive yourself to the hospital. Summary Abdominal bloating means that the abdomen is swollen. Common causes of abdominal bloating are swallowing air, constipation, and problems digesting food. Avoid eating too much and avoid swallowing air. Avoid foods that cause gas, carbonated drinks, hard candy, and chewing gum. This information is not intended to replace advice given to you by your health care provider. Make sure you discuss any questions you have with your health care provider. Document Revised: 04/04/2020 Document Reviewed: 04/04/2020 Elsevier Patient Education  2024 [employer]  

## 2023-04-10 LAB — LIPID PANEL
Cholesterol: 173 mg/dL (ref <200)
HDL: 83 mg/dL (ref >=50)
LDL Cholesterol (Calc): 73 mg/dL (calc)
Non-HDL Cholesterol (Calc): 90 mg/dL (calc) (ref <130)
Total CHOL/HDL Ratio: 2.1 (calc) (ref <5.0)
Triglycerides: 90 mg/dL (ref <150)

## 2023-04-10 LAB — HEMOGLOBIN A1C W/OUT EAG: Hgb A1c MFr Bld: 6 % of total Hgb — ABNORMAL HIGH (ref <5.7)

## 2023-04-10 LAB — COMPLETE METABOLIC PANEL WITH GFR
ALT: 8 U/L (ref 6–29)
AST: 13 U/L (ref 10–35)
Albumin: 4.1 g/dL (ref 3.6–5.1)
BUN: 14 mg/dL (ref 7–25)
CO2: 28 mmol/L (ref 20–32)
Calcium: 10.8 mg/dL — ABNORMAL HIGH (ref 8.6–10.4)
Chloride: 104 mmol/L (ref 98–110)
Creat: 0.92 mg/dL (ref 0.60–0.95)
Globulin: 3.4 g/dL (calc) (ref 1.9–3.7)
Glucose, Bld: 93 mg/dL (ref 65–99)
Potassium: 3.7 mmol/L (ref 3.5–5.3)
Sodium: 141 mmol/L (ref 135–146)
Total Bilirubin: 0.4 mg/dL (ref 0.2–1.2)
Total Protein: 7.5 g/dL (ref 6.1–8.1)
eGFR: 62 mL/min/{1.73_m2} (ref >=60)

## 2023-04-10 LAB — VITAMIN D 25 HYDROXY (VIT D DEFICIENCY, FRACTURES): Vit D, 25-Hydroxy: 53 ng/mL (ref 30–100)

## 2023-04-10 LAB — CBC WITH DIFFERENTIAL/PLATELET
Monocytes Relative: 12.6 %
RDW: 14 % (ref 11.0–15.0)

## 2023-04-25 ENCOUNTER — Other Ambulatory Visit: Payer: Self-pay | Admitting: Nurse Practitioner

## 2023-04-25 DIAGNOSIS — I10 Essential (primary) hypertension: Secondary | ICD-10-CM

## 2023-05-05 ENCOUNTER — Telehealth: Payer: Self-pay

## 2023-05-05 NOTE — Telephone Encounter (Signed)
I followed up with the patient about completing the Bone Density Scan. Plans on calling them by tomorrow to make an appointment.

## 2023-05-06 ENCOUNTER — Telehealth: Payer: Self-pay | Admitting: Nurse Practitioner

## 2023-05-06 NOTE — Telephone Encounter (Signed)
Patient is scheduled at [facility] next Friday (05/16/23) for her BMD. Will you please send an order for her to have her mammogram also? They told her that they could do them at the same time as BMD if order was sent.

## 2023-05-07 ENCOUNTER — Other Ambulatory Visit: Payer: Self-pay | Admitting: Nurse Practitioner

## 2023-05-07 DIAGNOSIS — Z1239 Encounter for other screening for malignant neoplasm of breast: Secondary | ICD-10-CM

## 2023-05-15 ENCOUNTER — Other Ambulatory Visit: Payer: Self-pay | Admitting: Nurse Practitioner

## 2023-05-16 DIAGNOSIS — Z1231 Encounter for screening mammogram for malignant neoplasm of breast: Secondary | ICD-10-CM | POA: Diagnosis not present

## 2023-05-16 LAB — HM MAMMOGRAPHY

## 2023-05-26 ENCOUNTER — Encounter: Payer: Self-pay | Admitting: Nurse Practitioner

## 2023-07-17 ENCOUNTER — Encounter: Payer: Self-pay | Admitting: Internal Medicine

## 2023-07-17 ENCOUNTER — Ambulatory Visit: Payer: Medicare PPO | Admitting: Internal Medicine

## 2023-07-17 VITALS — BP 136/80 | HR 79 | Temp 98.0°F | Resp 16 | Ht 66.0 in | Wt 146.2 lb

## 2023-07-17 DIAGNOSIS — R7309 Other abnormal glucose: Secondary | ICD-10-CM

## 2023-07-17 DIAGNOSIS — E782 Mixed hyperlipidemia: Secondary | ICD-10-CM | POA: Diagnosis not present

## 2023-07-17 DIAGNOSIS — Z23 Encounter for immunization: Secondary | ICD-10-CM

## 2023-07-17 DIAGNOSIS — I10 Essential (primary) hypertension: Secondary | ICD-10-CM

## 2023-07-17 DIAGNOSIS — Z79899 Other long term (current) drug therapy: Secondary | ICD-10-CM

## 2023-07-17 DIAGNOSIS — I700 Atherosclerosis of aorta: Secondary | ICD-10-CM

## 2023-07-17 DIAGNOSIS — E559 Vitamin D deficiency, unspecified: Secondary | ICD-10-CM

## 2023-07-17 NOTE — Progress Notes (Signed)
Future Appointments  Date Time Provider Department  07/17/2023                  6 mo ov 10:30 AM Lucky Cowboy, MD [facility]  11/06/2023  9:30 AM Shamleffer, Konrad Dolores, MD [facility]  12/30/2023                    cpe 10:00 AM Adela Glimpse, NP [facility]  04/09/2024                   wellness 10:30 AM Adela Glimpse, NP [facility]    History of Present Illness:       This very nice 82 y.o.  WBF with HTN, HLD, Pre-Diabetes and Vitamin D Deficiency presents for 6 month follow up .        Patient is also  followed  by Dr Marchelle Gearing  For mild COPD & ILD.       Patient is treated for HTN  (1975) & BP has been controlled at home. Today's BP is at goal -  136/80.  Negative Cardiolite in 2019.  Patient has had no complaints of any cardiac type chest pain, palpitations, dyspnea /orthopnea PND, dizziness, claudication  or dependent edema.      Hyperlipidemia is controlled with diet & low dose Rosuvastatin.  Patient denies myalgias or other med SE's. Last Lipids were  at goal:  Lab Results  Component Value Date   CHOL 173 04/09/2023   HDL 83 04/09/2023   LDLCALC 73 04/09/2023   TRIG 90 04/09/2023   CHOLHDL 2.1 04/09/2023     Also, the patient has history of PreDiabetes and has had no symptoms of reactive hypoglycemia, diabetic polys, paresthesias or visual blurring.  Last A1c was not at goal:  Lab Results  Component Value Date   HGBA1C 6.0 (H) 04/09/2023        Further, the patient also has history of Vitamin D Deficiency ("18" /2008)  and supplements vitamin D without any suspected side-effects. Last vitamin D was  at goal:  Lab Results  Component Value Date   VD25OH 53 04/09/2023       Current Outpatient Medications on File Prior to Visit  Medication Sig   acetaminophen (TYLENOL) 500 MG tablet Take 500 mg by mouth every 6 (six) hours as needed (pain).   amLODipine (NORVASC) 10 MG tablet TAKE 1 TABLET DAILY FOR BLOOD PRESSURE   Ascorbic Acid (VITAMIN C) 100 MG tablet Take 100  mg daily.   aspirin EC 81 MG tablet Take daily.     cholecalciferol (VITAMIN D) 1000 units tablet Take 6,000 Units daily    cloNIDine (CATAPRES) 0.2 MG tablet 0.2 mg as needed.   famotidine (PEPCID) 40 MG tablet Take 1 tablet every Day for Indigestion  & Heartburn   Flaxseed, Linseed, (FLAX SEED OIL) 1000 MG CAPS Take 1,000 mg by mouth daily after lunch.    halobetasol (ULTRAVATE) 0.05 % cream Apply topically 2 (two) times daily. Apply to Eczema Rash 2 x/day   Magnesium 500 MG TABS Take 500 mg by mouth.   Naphazoline HCl (CLEAR EYES OP) Place 1 drop into both eyes daily.   nitroGLYCERIN (NITROSTAT) 0.4 MG SL tablet Place 0.4 mg under the tongue every 5 (five) minutes as needed for chest pain.   OVER THE COUNTER MEDICATION Takes OTC eczema cream PRN.   rosuvastatin (CRESTOR) 5 MG tablet Take 1 tab in the evening three days a week (MWF) for cholesterol  to reduce heart attack and stroke risk.   Tiotropium Bromide Monohydrate (SPIRIVA RESPIMAT) 1.25 MCG/ACT AERS Inhale 2 puffs into the lungs daily.   traMADol (ULTRAM) 50 MG tablet Take 1 tablet (50 mg total) by mouth every 12 (twelve) hours as needed for moderate pain. 1 pill twice daily as needed for pain   triamterene-hydrochlorothiazide (MAXZIDE-25) 37.5-25 MG tablet TAKE 1 TABLET DAILY FOR BP & FLUID   Wheat Dextrin (BENEFIBER PO) Take 1 Dose by mouth daily.   zinc gluconate 50 MG tablet Take 50 mg by mouth daily.   nitroGLYCERIN (NITROSTAT) 0.4 MG SL tablet Place 1 tablet (0.4 mg total) under the tongue every 5 (five) minutes as needed for chest pain.   No current facility-administered medications on file prior to visit.    Allergies  Allergen Reactions   Ace Inhibitors Cough   Lemon Oil Hives    Reaction to lemons   Shellfish Allergy Swelling and Hives    PMHx:   Past Medical History:  Diagnosis Date   Allergic rhinitis    Arthritis    Bronchiectasis (HCC)    Colon polyps    Diverticulosis    Emphysema of lung (HCC)    GERD  (gastroesophageal reflux disease)    HSV-1 (herpes simplex virus 1) infection    Hypertension    Pulmonary nodule 07/10/2017   CT 01/2017, CT 07/2017 - stable on follow up, no further follow up recommended.    Immunization History  Administered Date(s) Administered   DTaP 07/30/2011   Influenza, High Dose Seasonal PF 06/07/2016, 06/25/2017, 09/22/2018, 07/19/2019   PFIZER SARS-COV-2 Vaccination 10/05/2019, 10/25/2019   Pneumococcal Conjugate-13 11/22/2015   Pneumococcal Polysaccharide-23 05/11/2013   Pneumococcal-Unspecified 01/07/2002   Td 01/07/2002, 05/07/2012    Past Surgical History:  Procedure Laterality Date   ABDOMINAL HYSTERECTOMY     COLONOSCOPY     DIRECT LARYNGOSCOPY N/A 10/21/2014   Procedure: DIRECT LARYNGOSCOPY;  Surgeon: Drema Halon, MD;  Location: [facility];  Service: ENT;  Laterality: N/A;   EXCISION NASAL MASS Right 10/21/2014   Procedure: EXCISION NASOPHARYNGEAL MASS;  Surgeon: Drema Halon, MD;  Location: [facility];  Service: ENT;  Laterality: Right;   EYE SURGERY     cataracts   MASS EXCISION Right 10/21/2014   Procedure: RIGHT NECK NODE BIOPSY;  Surgeon: Drema Halon, MD;  Location: [facility];  Service: ENT;  Laterality: Right;    FHx:    Reviewed / unchanged  SHx:    Reviewed / unchanged   Systems Review:  Constitutional: Denies fever, chills, wt changes, headaches, insomnia, fatigue, night sweats, change in appetite. Eyes: Denies redness, blurred vision, diplopia, discharge, itchy, watery eyes.  ENT: Denies discharge, congestion, post nasal drip, epistaxis, sore throat, earache, hearing loss, dental pain, tinnitus, vertigo, sinus pain, snoring.  CV: Denies chest pain, palpitations, irregular heartbeat, syncope, dyspnea, diaphoresis, orthopnea, PND, claudication or edema. Respiratory: denies cough, dyspnea, DOE, pleurisy, hoarseness, laryngitis, wheezing.  Gastrointestinal: Denies  dysphagia, odynophagia, heartburn, reflux, water brash, abdominal pain or cramps, nausea, vomiting, bloating, diarrhea, constipation, hematemesis, melena, hematochezia  or hemorrhoids. Genitourinary: Denies dysuria, frequency, urgency, nocturia, hesitancy, discharge, hematuria or flank pain. Musculoskeletal: Denies arthralgias, myalgias, stiffness, jt. swelling, pain, limping or strain/sprain.  Skin: Denies pruritus, rash, hives, warts, acne, eczema or change in skin lesion(s). Neuro: No weakness, tremor, incoordination, spasms, paresthesia or pain. Psychiatric: Denies confusion, memory loss or sensory loss. Endo: Denies change in weight, skin or hair change.  Heme/Lymph: No excessive bleeding, bruising or enlarged lymph nodes.  Physical Exam  BP 136/80   Pulse 79   Temp 98 F (36.7 C)   Resp 16   Ht 5\' 6"  (1.676 m)   Wt 146 lb 3.2 oz (66.3 kg)   SpO2 99%   BMI 23.60 kg/m   Appears  well nourished, well groomed  and in no distress.  Eyes: PERRLA, EOMs, conjunctiva no swelling or erythema. Sinuses: No frontal/maxillary tenderness ENT/Mouth: EAC's clear, TM's nl w/o erythema, bulging. Nares clear w/o erythema, swelling, exudates. Oropharynx clear without erythema or exudates. Oral hygiene is good. Tongue normal, non obstructing. Hearing intact.  Neck: Supple. Thyroid not palpable. Car 2+/2+ without bruits, nodes or JVD. Chest: Respirations nl with BS clear & equal w/o rales, rhonchi, wheezing or stridor.  Cor: Heart sounds normal w/ regular rate and rhythm without sig. murmurs, gallops, clicks or rubs. Peripheral pulses normal and equal  without edema.  Abdomen: Soft & bowel sounds normal. Non-tender w/o guarding, rebound, hernias, masses or organomegaly.  Lymphatics: Unremarkable.  Musculoskeletal: Full ROM all peripheral extremities, joint stability, 5/5 strength and normal gait.  Skin: Warm, dry without exposed rashes, lesions or ecchymosis apparent.  Neuro: Cranial nerves intact,  reflexes equal bilaterally. Sensory-motor testing grossly intact. Tendon reflexes grossly intact.  Pysch: Alert & oriented x 3.  Insight and judgement nl & appropriate. No ideations.  Assessment and Plan:   1. Essential hypertension  - Continue medication, monitor blood pressure at home.  - Continue DASH diet.  Reminder to go to the ER if any CP,  SOB, nausea, dizziness, severe HA, changes vision/speech.   - CBC with Differential/Platelet - COMPLETE METABOLIC PANEL WITH GFR - Magnesium - TSH   2. Hyperlipidemia, mixed  - Continue diet/meds, exercise,& lifestyle modifications.  - Continue monitor periodic cholesterol/liver & renal functions    - Lipid panel - TSH   3. Abnormal glucose  - Continue diet, exercise  - Lifestyle modifications.  - Monitor appropriate labs.   - Hemoglobin A1c - Insulin, random   4. Vitamin D deficiency  - Continue supplementation.       5. Aortic atherosclerosis (HCC)  - Lipid panel   6. Medication management  - CBC with Differential/Platelet - COMPLETE METABOLIC PANEL WITH GFR - Magnesium - Lipid panel - TSH - Hemoglobin A1c - Insulin, random         Discussed  regular exercise, BP monitoring, weight control to achieve/maintain BMI less than 25 and discussed med and SE's. Recommended labs to assess and monitor clinical status with further disposition pending results of labs.  I discussed the assessment and treatment plan with the patient. The patient was provided an opportunity to ask questions and all were answered. The patient agreed with the plan and demonstrated an understanding of the instructions.  I provided over 30 minutes of exam, counseling, chart review and  complex critical decision making.   Marinus Maw, MD

## 2023-07-17 NOTE — Patient Instructions (Signed)

## 2023-07-18 LAB — COMPLETE METABOLIC PANEL WITH GFR
AG Ratio: 1.4 (calc) (ref 1.0–2.5)
ALT: 10 U/L (ref 6–29)
AST: 9 U/L — ABNORMAL LOW (ref 10–35)
Albumin: 4 g/dL (ref 3.6–5.1)
Alkaline phosphatase (APISO): 62 U/L (ref 37–153)
BUN: 15 mg/dL (ref 7–25)
CO2: 31 mmol/L (ref 20–32)
Calcium: 10.2 mg/dL (ref 8.6–10.4)
Chloride: 104 mmol/L (ref 98–110)
Creat: 0.86 mg/dL (ref 0.60–0.95)
Globulin: 2.9 g/dL (ref 1.9–3.7)
Glucose, Bld: 95 mg/dL (ref 65–99)
Potassium: 3.8 mmol/L (ref 3.5–5.3)
Sodium: 141 mmol/L (ref 135–146)
Total Bilirubin: 0.3 mg/dL (ref 0.2–1.2)
Total Protein: 6.9 g/dL (ref 6.1–8.1)
eGFR: 67 mL/min/{1.73_m2} (ref >=60)

## 2023-07-18 LAB — CBC WITH DIFFERENTIAL/PLATELET
Absolute Lymphocytes: 1452 {cells}/uL (ref 850–3900)
Absolute Monocytes: 550 {cells}/uL (ref 200–950)
Basophils Absolute: 22 {cells}/uL (ref 0–200)
Basophils Relative: 0.5 %
Eosinophils Absolute: 211 {cells}/uL (ref 15–500)
Eosinophils Relative: 4.8 %
HCT: 41.1 % (ref 35.0–45.0)
Hemoglobin: 13.2 g/dL (ref 11.7–15.5)
MCH: 28.6 pg (ref 27.0–33.0)
MCHC: 32.1 g/dL (ref 32.0–36.0)
MCV: 89.2 fL (ref 80.0–100.0)
MPV: 9 fL (ref 7.5–12.5)
Monocytes Relative: 12.5 %
Neutro Abs: 2165 {cells}/uL (ref 1500–7800)
Neutrophils Relative %: 49.2 %
Platelets: 384 10*3/uL (ref 140–400)
RBC: 4.61 10*6/uL (ref 3.80–5.10)
RDW: 13.6 % (ref 11.0–15.0)
Total Lymphocyte: 33 %
WBC: 4.4 10*3/uL (ref 3.8–10.8)

## 2023-07-18 LAB — TEST AUTHORIZATION: TEST CODE:: 17306

## 2023-07-18 LAB — LIPID PANEL
Cholesterol: 161 mg/dL (ref <200)
HDL: 69 mg/dL (ref >=50)
LDL Cholesterol (Calc): 77 mg/dL
Non-HDL Cholesterol (Calc): 92 mg/dL (ref <130)
Total CHOL/HDL Ratio: 2.3 (calc) (ref <5.0)
Triglycerides: 66 mg/dL (ref <150)

## 2023-07-18 LAB — HEMOGLOBIN A1C
Hgb A1c MFr Bld: 6 %{Hb} — ABNORMAL HIGH (ref <5.7)
Mean Plasma Glucose: 126 mg/dL
eAG (mmol/L): 7 mmol/L

## 2023-07-18 LAB — MAGNESIUM: Magnesium: 2.1 mg/dL (ref 1.5–2.5)

## 2023-07-18 LAB — INSULIN, RANDOM: Insulin: 28.2 u[IU]/mL — ABNORMAL HIGH

## 2023-07-18 LAB — TSH: TSH: 1.15 m[IU]/L (ref 0.40–4.50)

## 2023-07-18 NOTE — Progress Notes (Signed)
<>*<>*<>*<>*<>*<>*<>*<>*<>*<>*<>*<>*<>*<>*<>*<>*<>*<>*<>*<>*<>*<>*<>*<>*<> <>*<>*<>*<>*<>*<>*<>*<>*<>*<>*<>*<>*<>*<>*<>*<>*<>*<>*<>*<>*<>*<>*<>*<>*<>  -Test results slightly outside the reference range are not unusual. If there is anything important, I will review this with you,  otherwise it is considered normal test values.  If you have further questions,  please do not hesitate to contact me at the office or via My Chart.   <>*<>*<>*<>*<>*<>*<>*<>*<>*<>*<>*<>*<>*<>*<>*<>*<>*<>*<>*<>*<>*<>*<>*<>*<> <>*<>*<>*<>*<>*<>*<>*<>*<>*<>*<>*<>*<>*<>*<>*<>*<>*<>*<>*<>*<>*<>*<>*<>*<>  - A1c= 6.0%   Blood sugar and A1c are STILL  elevated in the borderline and   early or pre-diabetes range which has the same    300% increased risk for heart attack, stroke, cancer and                                   alzheimer- type vascular dementia as full blown diabetes.   But the good news is that diet, exercise with                                             weight loss can cure the early diabetes at this point.    - It is very important that you work harder with diet by                             avoiding all foods that are white except chicken, fish & calliflower.  - Avoid white rice  (brown & wild rice is OK),   - Avoid white potatoes  (sweet potatoes in moderation is OK),   White bread or wheat bread or anything made out of   white flour like bagels, donuts, rolls, buns, biscuits, cakes,  - pastries, cookies, pizza crust, and pasta (made from  white flour & egg whites)   - vegetarian pasta or spinach or wheat pasta is OK.  - Multigrain breads like Arnold's, Pepperidge Farm or                multigrain sandwich thins or high fiber breads like                                          [place] bread or "Dave's Killer" breads that are   4 to 5 grams fiber per slice !  are best.     Diet, exercise and weight loss can reverse and cure  diabetes in the early stages.    - Diet, exercise  and weight loss is very important in the   control and prevention of complications of diabetes which                                                                         affects every system in your body  <>*<>*<>*<>*<>*<>*<>*<>*<>*<>*<>*<>*<>*<>*<>*<>*<>*<>*<>*<>*<>*<>*<>*<>*<> <>*<>*<>*<>*<>*<>*<>*<>*<>*<>*<>*<>*<>*<>*<>*<>*<>*<>*<>*<>*<>*<>*<>*<>*<> - All Else - CBC - Kidneys - Electrolytes - Liver - Magnesium & Thyroid    -  all  Normal / OK  <>*<>*<>*<>*<>*<>*<>*<>*<>*<>*<>*<>*<>*<>*<>*<>*<>*<>*<>*<>*<>*<>*<>*<>*<> <>*<>*<>*<>*<>*<>*<>*<>*<>*<>*<>*<>*<>*<>*<>*<>*<>*<>*<>*<>*<>*<>*<>*<>*<>               -

## 2023-08-29 ENCOUNTER — Other Ambulatory Visit: Payer: Self-pay | Admitting: Nurse Practitioner

## 2023-11-03 ENCOUNTER — Other Ambulatory Visit: Payer: Self-pay | Admitting: Internal Medicine

## 2023-11-03 DIAGNOSIS — E210 Primary hyperparathyroidism: Secondary | ICD-10-CM

## 2023-11-04 ENCOUNTER — Ambulatory Visit: Payer: Medicare PPO | Admitting: Internal Medicine

## 2023-11-04 ENCOUNTER — Encounter: Payer: Self-pay | Admitting: Internal Medicine

## 2023-11-04 VITALS — BP 134/82 | HR 55 | Ht 66.0 in | Wt 147.0 lb

## 2023-11-04 DIAGNOSIS — E210 Primary hyperparathyroidism: Secondary | ICD-10-CM | POA: Diagnosis not present

## 2023-11-04 LAB — BASIC METABOLIC PANEL
BUN: 15 mg/dL (ref 7–25)
CO2: 30 mmol/L (ref 20–32)
Calcium: 10.3 mg/dL (ref 8.6–10.4)
Chloride: 103 mmol/L (ref 98–110)
Creat: 0.85 mg/dL (ref 0.60–0.95)
Glucose, Bld: 89 mg/dL (ref 65–99)
Potassium: 3.6 mmol/L (ref 3.5–5.3)
Sodium: 141 mmol/L (ref 135–146)

## 2023-11-04 LAB — PARATHYROID HORMONE, INTACT (NO CA): PTH: 68 pg/mL (ref 16–77)

## 2023-11-04 LAB — ALBUMIN: Albumin: 4.1 g/dL (ref 3.6–5.1)

## 2023-11-04 LAB — VITAMIN D 25 HYDROXY (VIT D DEFICIENCY, FRACTURES): Vit D, 25-Hydroxy: 46 ng/mL (ref 30–100)

## 2023-11-04 LAB — CALCIUM, IONIZED: Calcium, Ion: 5.5 mg/dL (ref 4.7–5.5)

## 2023-11-04 NOTE — Patient Instructions (Addendum)
-   Stay Hydrated  - Avoid over the counter calcium tablets  - Maintain 2-3 servings of dietary calcium daily ( examples: low fat yogurt, cheese, milk  and green leafy vegetables )    You are scheduled for a bone density at [facility] on 11/13/2023 at 2:30 PM

## 2023-11-04 NOTE — Progress Notes (Unsigned)
Name: Catherine Parks  MRN/ DOB: [id number], October 20, 1940    Age/ Sex: 83 y.o., female    PCP: Lucky Cowboy, MD   Reason for Endocrinology Evaluation: Hypercalcemia      Date of Initial Endocrinology Evaluation: 10/29/2021    HPI: Catherine Parks is a 83 y.o. female with a past medical history of HTN , low bone density and dyslipidemia . The patient presented for initial endocrinology clinic visit on 10/29/2021 for consultative assistance with her Hypercalcemia .   Catherine Parks indicates that she was first diagnosed with hypercalcemia in  her 70's. In review of her chart she has been noted with hypercalcemia in 2015, with a max level of 11.1 mg/dL (uncorrected)  in 10/9526 with inappropriately normal PTH  at 59 pg/mL in 12/2020. Pr  She denies  use of over the counter calcium (including supplements, Tums, Rolaids, or other calcium containing antacids),or  lithium.  She is on  HCTZ, and vitamin D   She denies  history of kidney stones, kidney disease, liver disease, granulomatous disease. She denies  osteoporosis or prior fractures.  She denies  family history of osteoporosis, parathyroid disease, thyroid disease.    On her initial visit to our clinic she had an elevated vitamin D at 113 NG/mL, which was discontinued with repeat levels normalizing at 54 NG/mL in July 2023   Her 24-hour urinary calcium excretion was normal at 48 mg/DL in June 2022  DXA in 4132 showed low bone density with a T score of -1.4 at the left total hip Repeat DXA in 10/2021 was normal   SUBJECTIVE:    Today (11/04/23):  [name] is here for follow-up on hypercalcemia   Denies polydipsia  She has been staying hydrated  She does have frequency that she attributes to diuretic  She has chronic constipation  She is not on vitamin D anymore  Denies recent falls Denies renal stones  Denies recent bone fracture      HISTORY:  Past Medical History:  Past Medical History:  Diagnosis Date    Allergic rhinitis    Arthritis    Bronchiectasis (HCC)    Colon polyps    Diverticulosis    Emphysema of lung (HCC)    GERD (gastroesophageal reflux disease)    HSV-1 (herpes simplex virus 1) infection    Hyperparathyroidism (HCC)    Hypertension    Pulmonary nodule 07/10/2017   CT 01/2017, CT 07/2017 - stable on follow up, no further follow up recommended.   Past Surgical History:  Past Surgical History:  Procedure Laterality Date   ABDOMINAL HYSTERECTOMY     ovaries spared   CESAREAN SECTION  1969   COLONOSCOPY     DIRECT LARYNGOSCOPY N/A 10/21/2014   Procedure: DIRECT LARYNGOSCOPY;  Surgeon: Catherine Halon, MD;  Location: [facility];  Service: ENT;  Laterality: N/A;   EXCISION NASAL MASS Right 10/21/2014   Procedure: EXCISION NASOPHARYNGEAL MASS;  Surgeon: Catherine Halon, MD;  Location: [facility];  Service: ENT;  Laterality: Right;   EYE SURGERY     cataracts   MASS EXCISION Right 10/21/2014   Procedure: RIGHT NECK NODE BIOPSY;  Surgeon: Catherine Halon, MD;  Location: [facility];  Service: ENT;  Laterality: Right;    Social History:  reports that she quit smoking about 9 years ago. Her smoking use included cigarettes. She started smoking about 67 years ago. She has a 58 pack-year smoking history. She  has never used smokeless tobacco. She reports that she does not drink alcohol and does not use drugs. Family History: family history includes Breast cancer in her maternal grandmother, sister, and sister; Cancer in her brother and brother; Heart attack in her father; Hypertension in her mother; Lung cancer (age of onset: 33) in her sister; Other in her brother; Prostate cancer in her brother; Stroke in her mother.   HOME MEDICATIONS: Allergies as of 11/04/2023       Reactions   Ace Inhibitors Cough   Lemon Oil Hives   Reaction to lemons   Natural Vegetable Orange [psyllium]    oranges   Shellfish Allergy Swelling, Hives         Medication List        Accurate as of November 04, 2023  9:26 AM. If you have any questions, ask your nurse or doctor.          acetaminophen 500 MG tablet Commonly known as: TYLENOL Take 500 mg by mouth every 6 (six) hours as needed (pain).   amLODipine 10 MG tablet Commonly known as: NORVASC TAKE 1 TABLET BY MOUTH EVERY DAY FOR BLOOD PRESSURE   azelastine 0.1 % nasal spray Commonly known as: ASTELIN   BENEFIBER PO Take 1 Dose by mouth daily.   CLEAR EYES OP Place 1 drop into both eyes daily.   famotidine 40 MG tablet Commonly known as: PEPCID TAKE 1 TABLET DAILY TO PREVENT HEARTBURN & INDIGESTION   halobetasol 0.05 % cream Commonly known as: Ultravate Apply topically 2 (two) times daily. Apply to Eczema Rash 2 x/day   hydrocortisone 2.5 % cream   Magnesium 250 MG Tabs Take 1 tablet (250 mg total) by mouth daily.   nitroGLYCERIN 0.4 MG SL tablet Commonly known as: NITROSTAT Place 0.4 mg under the tongue every 5 (five) minutes as needed for chest pain.   OVER THE COUNTER MEDICATION Takes OTC eczema cream PRN.   pantoprazole 40 MG tablet Commonly known as: PROTONIX Take 1 tablet (40 mg total) by mouth daily.   rosuvastatin 5 MG tablet Commonly known as: CRESTOR TAKE 1 TABLET IN THE EVENING 5 DAYS A WEEK FOR CHOLESTEROL TO REDUCE HEART ATTACK/STROKE RISK   Spikevax syringe Generic drug: COVID-19 mRNA vaccine   triamterene-hydrochlorothiazide 37.5-25 MG tablet Commonly known as: MAXZIDE-25 TAKE 1 TABLET DAILY FOR BP & FLUID   vitamin C 100 MG tablet Take 100 mg by mouth daily.          REVIEW OF SYSTEMS: A comprehensive ROS was conducted with the patient and is negative except as per HPI    OBJECTIVE:  VS: BP 134/82 (BP Location: Left Arm, Patient Position: Sitting, Cuff Size: Small)   Pulse (!) 55   Ht 5\' 6"  (1.676 m)   Wt 147 lb (66.7 kg)   SpO2 98%   BMI 23.73 kg/m    Wt Readings from Last 3 Encounters:  11/04/23 147 lb  (66.7 kg)  07/17/23 146 lb 3.2 oz (66.3 kg)  04/09/23 145 lb 9.6 oz (66 kg)     EXAM: General: Pt appears well and is in NAD  Neck: General: Supple without adenopathy. Thyroid: Thyroid size normal.  No goiter or nodules appreciated.  Lungs: Clear with good BS bilat with no rales, rhonchi, or wheezes  Heart: Auscultation: RRR.  Abdomen:  soft, nontender  Extremities:  BL LE: No pretibial edema  Mental Status: Judgment, insight: Intact Orientation: Oriented to time, place, and person Mood and affect: No depression,  anxiety, or agitation     DATA REVIEWED:     Latest Reference Range & Units 11/04/23 10:12  Sodium 135 - 146 mmol/L 141  Potassium 3.5 - 5.3 mmol/L 3.6  Chloride 98 - 110 mmol/L 103  CO2 20 - 32 mmol/L 30  Glucose 65 - 99 mg/dL 89  BUN 7 - 25 mg/dL 15  Creatinine 6.21 - 3.08 mg/dL 6.57  Calcium 8.6 - 84.6 mg/dL 96.2  BUN/Creatinine Ratio 6 - 22 (calc) SEE NOTE:  Calcium Ionized 4.7 - 5.5 mg/dL 5.5    Latest Reference Range & Units 11/04/23 10:12  Vitamin D, 25-Hydroxy 30 - 100 ng/mL 46    Latest Reference Range & Units 11/04/23 10:12  PTH, Intact 16 - 77 pg/mL 68    Latest Reference Range & Units 11/04/23 10:12  Albumin MSPROF 3.6 - 5.1 g/dL 4.1    DXA 9/52/8413 normal     ASSESSMENT/PLAN/RECOMMENDATIONS:    1.Hyperparathyroidism:  - No indication for surgical intervention - DXA normal 10/2021, she is scheduled for repeat 11/13/2023 at [facility] mammography -24-hour urine collection in 02/2021 with 48 mg, repeat 74 mg 04/2022 while on HCTZ -Labs today show normal renal function, PTH, serum calcium and vitamin D  Recommendations  - Stay Hydrated  - Avoid over the counter calcium tablets  - Maintain 2-3 servings of dietary calcium daily ( examples: low fat yogurt, cheese, milk  and green leafy vegetables )     F/U in 1 yr  Signed electronically by: Lyndle Herrlich, MD  [facility] Endocrinology  [facility] [address] [place]  [address] [place], [place] [address] Phone: [phone] FAX: [phone]   CC: Lucky Cowboy, MD [address] [place] [address] Phone: [phone] Fax: [phone]   Return to Endocrinology clinic as below: Future Appointments  Date Time Provider Department Center  11/04/2023  9:30 AM Evamaria Detore, Konrad Dolores, MD [facility] None  07/16/2024 10:30 AM Lucky Cowboy, MD [facility] None

## 2023-11-05 ENCOUNTER — Encounter: Payer: Self-pay | Admitting: Internal Medicine

## 2023-11-05 ENCOUNTER — Other Ambulatory Visit: Payer: Self-pay

## 2023-11-05 MED ORDER — ROSUVASTATIN CALCIUM 5 MG PO TABS: ORAL_TABLET | ORAL | 1 refills | Status: DC

## 2023-11-06 ENCOUNTER — Ambulatory Visit: Payer: Medicare PPO | Admitting: Internal Medicine

## 2023-11-07 ENCOUNTER — Encounter: Payer: Self-pay | Admitting: Internal Medicine

## 2023-11-13 LAB — HM DEXA SCAN

## 2023-11-14 ENCOUNTER — Encounter: Payer: Self-pay | Admitting: Internal Medicine

## 2023-11-26 ENCOUNTER — Encounter: Payer: Self-pay | Admitting: Internal Medicine

## 2023-11-26 ENCOUNTER — Ambulatory Visit: Admitting: Internal Medicine

## 2023-11-26 VITALS — BP 128/80 | HR 72 | Ht 65.0 in | Wt 145.6 lb

## 2023-11-26 DIAGNOSIS — J439 Emphysema, unspecified: Secondary | ICD-10-CM | POA: Diagnosis not present

## 2023-11-26 DIAGNOSIS — R768 Other specified abnormal immunological findings in serum: Secondary | ICD-10-CM | POA: Diagnosis not present

## 2023-11-26 DIAGNOSIS — J849 Interstitial pulmonary disease, unspecified: Secondary | ICD-10-CM

## 2023-11-26 DIAGNOSIS — Z87891 Personal history of nicotine dependence: Secondary | ICD-10-CM

## 2023-11-26 NOTE — Progress Notes (Signed)
 @Patient  ID: Catherine Parks, female    DOB: June 19, 1941, 83 y.o.   MRN: [id number]  Chief Complaint  Patient presents with   Follow-up    pft    Referring provider: Lucky Cowboy, MD  HPI  83 year old female former smoker followed for mild COPD with emphysema , lung nodule and ILD    TESTS: High resolution CT chest November 2017 showed ILD changes with  basilar predominant patchy subpleural reticulation, traction bronchiectasis and mild architectural distortion with mild honeycombing at the right base.  CT chest May 2018 showed no change in numerous tiny pulmonary nodules scattered throughout the lung 6 mm dating back to October 2017, stable ILD changes CT chest November 2018 showed stable pulmonary nodules felt to be benign with no specific follow-up noted.  Chronic changes of interstitial fibrosis without change.   PFT 06/13/17: FVC 2.89 L (121%) FEV1 1.91 L (103%) FEV1/FVC 0.66 FEF 25-75 1.00 L (63%)                                                                                                                        DLCO corrected 46% 12/27/16: FVC 2.81 L (117%) FEV1 1.85 L (99%) FEV1/FVC 0.66 FEF 25-75 0.93 L (58%)                                                                                                                         DLCO uncorrected 40% 09/27/16: FVC 2.92 L (121%) FEV1 2.01 L (180%) FEV1/FVC 0.69 FEF 25-75 1.20 L (74%) negative bronchodilator response TLC 5.05 L (94%) RV 85% ERV 501% DLCO corrected 50% (Hgb 12.7)      LABS 08/02/16 Alpha-1 antitrypsin: MM (153) IgG: 1276 IgA: 658 IgM: 76 IgE: 1652 RAST Panel:  D farinae 1.75 / Cockroach 1.66 & other weak positives CRP: 0.1 ESR: 59 ANA:  Positive DS DNA Ab:  181 Smith Ab:  <0.2 RNP Ab:  <0.2 SSA:  <0.2 SSB:  <0.2 Anti-CCP:  <16  OV 02/26/19 - follow up mild COPD, ILD, pulmonary nodules Patient presents for follow-up visit today.  She was last seen by Rubye Oaks on 12/24/2017.  She  states that this is been a stable interval for her.  She has not seen any increase in her shortness of breath.  She states that she has been more inactive than usual due to current COVID pandemic.  She was going to the [facility] to walk 3 times  per week but the [employer] has been closed for the last few months.  Patient tries to remain active inside her house and does lift some light weight dumbbells.  She does continue to get winded with heavy activity.  She is not on any inhalers.  Patient has known ILD changes on CT scan dating back to October 2017.  Previous CT chest showed stable changes.  Patient did have a PFT completed in office today and it is stable from last PFT.  Patient has had previous autoimmune work-up that was suggestive of lupus and was referred to rheumatology.  She is not on no suppression.   Patient has a history of pulmonary nodules on CT scan.  These nodules had shown no change and were consistent with a benign etiology.  Patient's last CT chest was in 2018.  OV 01/19/2020  Subjective:  Patient ID: Catherine Parks, female , DOB: May 26, 1941 , age 2 y.o. , MRN: [id number] , ADDRESS: [address] [place] [address]   01/19/2020 -   Chief Complaint  Patient presents with   Follow-up    no concerns   Follow-up combination emphysema and ILD with pulmonary nodule. ANA positive and double-stranded DNA positive in 2017  HPI Kimyetta M Catherine Parks 83 y.o. -returns for follow-up.  I personally not seen her in few to several years.  She is seen the nurse practitioner in between.  Nurse practitioner notes her above.  Overall she tells me she is doing stable.  She is very minimal symptoms.  Symptom score is documented below.  For positive ANA and double-stranded DNA last year she is did see a rheumatologist.  She does not know who she saw but said she was reassured that she did not have any systemic evidence of connective tissue disease.  She is not taking any inhalers.  She feels that her  shortness of breath on exertion is much improved.  She only notices it for stairs but even then it is minimal.  Her most recent test results were reviewed.  She is interested in serial monitoring and ensuring disease is stable.  She is concerned about interstitial lung disease.  She had a cardiac stress test in 2019 that was normal.  Her CT scan of the chest also shows enlarged pulmonary arteries.  She has grade 1 diastolic dysfunction on an echo.    OV 05/01/2020   Subjective:  Patient ID: Catherine Parks, female , DOB: [birth year], age 80 y.o. years. , MRN: [id number],  ADDRESS: [address] [place] [address] PCP  Lucky Cowboy, MD Providers : Treatment Team:  Attending Provider: Kalman Shan, MD   Chief Complaint  Patient presents with   Follow-up    Pt states she has been doing okay since last visit. States that she still becomes SOB with exertion.    Follow-up combination emphysema and ILD with pulmonary nodule. ANA positive and double-stranded DNA positive 181 in 2017 Positive IGE - 1652 , and positive enviromental allergy test RAST - Nov 2017 Piror > 50 pack smoker  HPI [name] 83 y.o. -presents for follow-up for the above medical issues.  This is to discuss the results of investigation.  At this point in time she tells me that her dyspnea is somewhat worse.  No chest pain or cough.  In the past inhalers have not worked.  Her pulmonary function test and CT scan of the chest shows stability in both ILD and emphysema and solitary pulmonary nodule.  She feels overall that her ILD has been there for a long time and it is been stable.  However she feels dyspnea is worse.  The interim finding is that she has new depressed ejection fraction of the left ventricle.  She has an appointment Dr. Dietrich Pates in June 16, 2020.  There is no associated chest pain.  There is no orthopnea there is no proximal nocturnal dyspnea.  She is reluctant about antifibrotic's  because she has GI issues although she has marked 0 for vomiting and diarrhea.  Review of the labs indicate that in 2017 she was also significantly positive IgE and other allergens.  She tells me there is no mold in the house.  She has no pet birds.  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     SYMPTOM SCALE - ILD 01/19/2020  05/01/2020   O2 use ra ra  Shortness of Breath 0 -> 5 scale with 5 being worst (score 6 If unable to do)   At rest 0 0  Simple tasks - showers, clothes change, eating, shaving 0 1  Household (dishes, doing bed, laundry) 1 3  Shopping 1 3  Walking level at own pace 1 2  Walking up Stairs 2 3  Total (30-36) Dyspnea Score 5 12  How bad is your cough? 0 1  How bad is your fatigue 1 0  How bad is nausea 0 0  How bad is vomiting?  0 0  How bad is diarrhea? 0 0  How bad is anxiety? 0 2  How bad is depression 0 1   Simple office walk 185 feet x  3 laps goal with forehead probe 01/19/2020   O2 used ra  Number laps completed 3  Comments about pace x  Resting Pulse Ox/HR 100% and x/min  Final Pulse Ox/HR 98% and 98/min  Desaturated </= 88% no  Desaturated <= 3% points no  Got Tachycardic >/= 90/min yes  Symptoms at end of test x  Miscellaneous comments x   Results for UMA, JERDE (MRN [id number]) as of 05/01/2020 09:03  Ref. Range 09/27/2016 10:23 12/27/2016 08:50 06/13/2017 13:24 02/26/2019 08:48 04/20/2020 10:47  FVC-Pre Latest Units: L 2.92 2.81 2.89 2.75 2.78  FVC-%Pred-Pre Latest Units: % 121 117 121 118 121   Results for ROXAN, YAMAMOTO (MRN [id number]) as of 05/01/2020 09:03  Ref. Range 09/27/2016 10:23 12/27/2016 08:50 06/13/2017 13:24 02/26/2019 08:48 04/20/2020 10:47  DLCO unc Latest Units: ml/min/mmHg 13.33 10.95 12.85 11.55 10.81  DLCO unc % pred Latest Units: % 49 40 47 57 53      HRCT 04/24/20  CLINICAL DATA:  Interstitial lung disease. Increased shortness of breath with exertion the past few  years.   EXAM: CT CHEST WITHOUT CONTRAST   TECHNIQUE: Multidetector CT imaging of the chest was performed following the standard protocol without intravenous contrast. High resolution imaging of the lungs, as well as inspiratory and expiratory imaging, was performed.   COMPARISON:  03/17/2019 07/17/2017.   FINDINGS: Cardiovascular: Atherosclerotic calcification of the aorta, aortic valve and coronary arteries. Pulmonic trunk is enlarged. Heart size normal. No pericardial effusion.   Mediastinum/Nodes: 1.2 cm low-attenuation left thyroid nodule. No follow-up recommended (ref: J Am Coll Radiol. 2015 Feb;12(2): 143-50).No pathologically enlarged mediastinal or axillary lymph nodes. Hilar regions are difficult to definitively evaluate without IV contrast but appear grossly unremarkable. Esophagus is slightly dilated.   Lungs/Pleura: Centrilobular and paraseptal emphysema. There  is traction bronchiectasis, ground-glass and mild architectural distortion predominating in the lower lobes, as on prior exams. Minimal subpleural reticulation is seen in association. No honeycombing. 5 mm anterior left lower lobe nodule (3/120) is unchanged from 07/17/2017 and considered benign. No pleural fluid. Airway is unremarkable. Minimal air trapping.   Upper Abdomen: Visualized portions of the liver, adrenal glands, kidneys, spleen, pancreas, stomach and bowel are grossly unremarkable. No upper abdominal adenopathy.   Musculoskeletal: No worrisome lytic or sclerotic lesions. T12 and L1 compression fractures are new. No worrisome lytic or sclerotic lesions.   IMPRESSION: 1. Pulmonary parenchymal pattern of fibrosis may be due to fibrotic nonspecific interstitial pneumonitis. Chronic hypersensitivity pneumonitis is not excluded. Findings are indeterminate for UIP per consensus guidelines: Diagnosis of Idiopathic Pulmonary Fibrosis: An Official ATS/ERS/JRS/ALAT Clinical Practice Guideline. Am Rosezetta Schlatter Crit Care Med Vol 198, Iss 5, [phone], May 17 2017. 2. Aortic atherosclerosis (ICD10-I70.0). Coronary artery calcification. 3. Enlarged pulmonic trunk, indicative of pulmonary arterial hypertension. 4.  Emphysema (ICD10-J43.9).     Electronically Signed   By: Leanna Battles M.D.   On: 04/24/2020 12:47    ECHO 04/21/20   Sonographer:    Celene Skeen RDCS (AE)  Referring Phys: [id number] Stewart   IMPRESSIONS     1. Left ventricular ejection fraction, by estimation, is 35 to 40%. The  left ventricle has moderately decreased function. The left ventricle  demonstrates global hypokinesis. Left ventricular diastolic parameters are  consistent with Grade I diastolic  dysfunction (impaired relaxation). The average left ventricular global  longitudinal strain is -15.8 %.   2. Right ventricular systolic function is mildly reduced. The right  ventricular size is normal.   3. The mitral valve is grossly normal. Trivial mitral valve  regurgitation.   4. The aortic valve is grossly normal. Aortic valve regurgitation is not  visualized. No aortic stenosis is present.     ROS - per HPI   OV 10/31/2020  Subjective:  Patient ID: Catherine Parks, female , DOB: 1941-02-05 , age 57 y.o. , MRN: [id number] , ADDRESS: [address] [place] [address] PCP Lucky Cowboy, MD Patient Care Team: Lucky Cowboy, MD as PCP - General (Internal Medicine) Sallye Lat, MD as Consulting Physician (Optometry) Croitoru, Rachelle Hora, MD as Consulting Physician (Cardiology) Mardella Layman, MD as Consulting Physician (Gastroenterology) Heron Nay, MD (Gynecology) Cherlyn Roberts, MD as Consulting Physician (Dermatology) Josph Macho, MD as Consulting Physician (Oncology) Drema Halon, MD as Consulting Physician (Otolaryngology)  This Provider for this visit: Treatment Team:  Attending Provider: Kalman Shan, MD    10/31/2020 -   Chief  Complaint  Patient presents with   Follow-up    Doing well    Follow-up combination emphysema and ILD with pulmonary nodule.  - indeterminate UIP; Last CT Aug 2021  - HP panel neg aug 2021  -ANA positive and double-stranded DNA +181 in November 2017 ANA positive and double-stranded DNA positive 181 in 2017 Positive IGE   - 1652 , and positive enviromental allergy test RAST - Nov 2017  - elevated 1411 - in aug 2021  - 2021 - sees Dr Madie Reno  Piror > 50 pack smoker   HPI [name] 83 y.o. -presents for follow-up.  Last seen in summer 2021.  At the time her ILD was stable.  Etiology of the indeterminate pattern of ILD was not known.  I ordered a hypersensitive pneumonitis panel was negative.  Her IgE was elevated.  She has seen  Dr. Madie Reno in allergy according to history.  I do not know the details of this but apparently she is on as needed tablets.  At this point in time her dyspnea is improved.  Her walking desaturation test is stable.  Her pulmonary function test showed decline in FVC with stability/improvement in DLCO.  Overall she is stable.  She has not attended pulmonary rehabilitation a few years.  She found it beneficial in the past but she does not want to go again because she is feeling better and she plans to work out with her friends.  There was concern of low ejection fraction and I sent her to cardiology Dr. Tenny Craw.  Apparently repeat images were done and I confirmed this on the note.  The EF is actually normal.  Overall patient is satisfied with the quality of life.  She is not interested in lung biopsy preemptive antifibrotic's.  She is interested in serial monitoring and supportive care at this point.  She will address antifibrotic's if she declines based on a risk-benefit analysis at that time.  For her associated emphysema last time I gave her Spiriva.  She tells me that it did not benefit her.  She is not interested in that or albuterol as needed.  No systemic  manifestations of connective tissue disease so far    OV 09/20/2021  Subjective:  Patient ID: Catherine Parks, female , DOB: 1940-10-14 , age 11 y.o. , MRN: [id number] , ADDRESS: [address] [place] [address] PCP Lucky Cowboy, MD Patient Care Team: Lucky Cowboy, MD as PCP - General (Internal Medicine) Sallye Lat, MD as Consulting Physician (Optometry) Croitoru, Rachelle Hora, MD as Consulting Physician (Cardiology) Mardella Layman, MD as Consulting Physician (Gastroenterology) Heron Nay, MD (Gynecology) Cherlyn Roberts, MD as Consulting Physician (Dermatology) Josph Macho, MD as Consulting Physician (Oncology) Drema Halon, MD (Inactive) as Consulting Physician (Otolaryngology)  This Provider for this visit: Treatment Team:  Attending Provider: Kalman Shan, MD    09/20/2021 -   Chief Complaint  Patient presents with   Follow-up    PFT performed today.  Pt states she is about the same since last visit.     HPI [place] Kosek 83 y.o. -returns for follow-up.  Not seen her in a year.  Most recent CT scan November 2022 shows probable UIP.  Symptoms and pulmonary function tests are stable.  There are no new issues other than the fact she updated me about her GI issues.  At this point in time we discussed about continued supportive care.  She is in favor of this.  We discussed antifibrotic's again.  I went over the antifibrotic's and the side effect profile and the benefit profile.  She is again hesitant towards this.  Discussed the third option about clinical trials with an infusion study against connective tissue growth factor and the potential side effect profile of that.  She is interested in this.  We gave her the consent form.  But later the research coordinator indicated that because her FVC is over 95% she does not qualify for this trial.  Research coronary will inform the patient about this.  We will discuss in a case conference if  this is IPF   FEB 2023 MDD  Interstitial Lung Disease Multidisciplinary Conference   DOROTA HEINRICHS    MRN [id number]    DOB 06/11/1941  Primary Care Physician:McKeown, Chrissie Noa, MD  Referring Physician: Lavinia Sharps  Time of Conference: 7.30am- 8.30am Date of conference:  10/30/21 Location of Conference: -  Virtual  Participating Pulmonary: Dr. Kalman Shan, MD - yes,  Dr Chilton Greathouse, MD - yes Pathology: Dr Holley Bouche, MD - no , ] Radiology: Dr Cleone Slim MD - no, Dr Trudie Reed MD - yes,  Others: Charolett Bumpers and Rise Paganini  Brief History: "Combination emphysema and ILD with pulmonary nodule. ANA positive and double-stranded DNA positive 181 in 2017. Positive IGE - 1652 , and positive enviromental allergy test RAST - Nov 2017. Prior > 50 pack smoker. Asymptomatic mostly - very mild doe for stairs. Spirometry normal but DLCO reduced 43% - reflective of combined emphyhsema/fibrosis - PFT mild progression 2018-> 2023. Last HRCT - nov 2022 - described as prob UIP. -What is the MDD dx on radiology and clinical?" - she does not want standard of care anti-fibrotic  Serology:   Latest Reference Range & Units 10/12/10 20:28 12/03/10 08:47 08/02/16 10:29  Anti Nuclear Antibody (ANA) Negative    Positive !  Angiotensin 1 CE 8 - 52 U/L  60 (H)   Cyclic Citrullin Peptide Ab Units   <16  dsDNA Ab 0 - 9 IU/mL   181 (H)  ENA RNP Ab 0.0 - 0.9 AI   <0.2  ENA SSA (RO) Ab 0.0 - 0.9 AI   <0.2  ENA SSB (LA) Ab 0.0 - 0.9 AI   <0.2  Tissue Transglutaminase Ab, IgA <20 units 4.2 U/ML    ENA SM Ab Ser-aCnc 0.0 - 0.9 AI   <0.2  !: Data is abnormal (H): Data is abnormally high  MDD discussion of CT scan    - Date or time period of scan:  "HRCT: 07/24/2021 HRCT: 04/24/2020 HRCT: 08/07/2016 "  - Features mentioned:  - CT - has emphysema but in terims of ILD has subpleural bronchiectactis and some HC in RLL Agree with prob UIP. Very slow progression on CT over long period time  - What  is the final conclusion per 2018 ATS/Fleischner Criteria - PRobable UIP with very slow progression over time  Pathology discussion of biopsy no:    MDD Impression/Recs: IPF. She has positive DS-DNA but the radiologic pattern is not c/w IPAF. Next visit recheck ANA pattern and DS=-DNA   Time Spent in preparation and discussion:  > 30 min    OV 11/26/2023  Subjective:  Patient ID: Catherine Parks, female , DOB: Feb 10, 1941 , age 47 y.o. , MRN: [id number] , ADDRESS: [address] [place] [address] PCP Lucky Cowboy, MD Patient Care Team: Lucky Cowboy, MD as PCP - General (Internal Medicine) Sallye Lat, MD as Consulting Physician (Optometry) Croitoru, Rachelle Hora, MD as Consulting Physician (Cardiology) Heron Nay, MD (Gynecology) Josph Macho, MD as Consulting Physician (Oncology) Lucky Cowboy, MD as Referring Physician (Internal Medicine) Kalman Shan, MD as Consulting Physician (Pulmonary Disease)  This Provider for this visit: Treatment Team:  Attending Provider: Kalman Shan, MD   Follow-up combination emphysema and ILD with pulmonary nodule.  - indeterminate UIP; Last CT Nov 2022  - HP panel neg aug 2021  -ANA positive and double-stranded DNA +181 in November 2017   = ACE positive 2012 at 60   Positive IGE with psiti RAST Test 2017- sees DR Irena Cords sinc 2017   - 1652 , and positive enviromental allergy test RAST - Nov 2017  - elevated 1411 - in aug 2021  - 2021 - sees Dr Madie Reno  Piror > 50 pack smoker  Chronic systolic dysfunction sees Dr. Gunnar Fusi Ross-last visit  October 2021  Hypercalcemia  Chronic abdominal pain sees Dr. Tressia Danas  -Colonoscopy October 2022 with pancolonic diverticulosis and EGD is mild sized hiatal hernia, gastritis and duodenitis biopsies negative  Hypertension  Nonspecific arthritis  11/26/2023 -   Chief Complaint  Patient presents with   Follow-up    Breathing is overall doing  well. She has occ chest heaviness.      HPI [place] Fowle 83 y.o. -presents for ILD.  Last seen just over 2 years ago.  She says that overall she is been doing well.  No respiratory issues no change in her respiratory status.  She does find that it is more difficult to do household work this because of a combination of fatigue which she believes is because of shortness of breath and also aging.  Nevertheless there is no change in overall health status.  Denies any Raynaud's.  Denies any change in her baseline allergies.  No cough.  Exercise hypoxemia test here today is normal.  Her last PFT and CT scan was 2 years ago.  Her primary care physician suddenly passed away.  She is willing to get restage.  At the multidisciplinary case conference 2 years ago it was decided that we would recheck her ANA and double-stranded DNA.  She had this information with and she willing to do that.      SYMPTOM SCALE - ILD 01/19/2020  05/01/2020  10/31/2020  09/20/2021  11/26/2023   O2 use ra ra ra ra   Shortness of Breath 0 -> 5 scale with 5 being worst (score 6 If unable to do)      At rest 0 0 1 0   Simple tasks - showers, clothes change, eating, shaving 0 1 1 0   Household (dishes, doing bed, laundry) 1 3 2 1    Shopping 1 3 1 1    Walking level at own pace 1 2 2 1    Walking up Stairs 2 3 2 2    Total (30-36) Dyspnea Score 5 12 9 5    How bad is your cough? 0 1 0 0   How bad is your fatigue 1 0 1 0   How bad is nausea 0 0 0 0   How bad is vomiting?  0 0 0 0   How bad is diarrhea? 0 0 0 0   How bad is anxiety? 0 2 0 0   How bad is depression 0 1 0 0    Simple office walk 185 feet x  3 laps goal with forehead probe 01/19/2020  10/31/2020  09/20/2021  11/26/2023   O2 used ra ra ra ra  Number laps completed 3 3 3  Sit and stand x 15 toes- tookd  Comments about pace x avg pacd avg   Resting Pulse Ox/HR 100% and x/min 100% and70 100% and 70 100% and HR 72  Final Pulse Ox/HR 98% and 98/min 99% and 88/mn 99%  and 119 99% and HR 100  Desaturated </= 88% no no no   Desaturated <= 3% points no no no   Got Tachycardic >/= 90/min yes no yes   Symptoms at end of test x No dyspnea No complaints No complaints  Miscellaneous comments x  ? More tacht Took 58 sec    PFT     Latest Ref Rng & Units 09/20/2021    9:52 AM 08/31/2020    8:53 AM 04/20/2020   10:47 AM 02/26/2019    8:48 AM 06/13/2017    1:24  PM 12/27/2016    8:50 AM 09/27/2016   10:23 AM  PFT Results  FVC-Pre L 2.65  2.64  2.78  2.75  2.89  2.81  2.92   FVC-Predicted Pre % 117  115  121  118  121  117  121   FVC-Post L    2.75    2.95   FVC-Predicted Post %    118    122   Pre FEV1/FVC % % 62  65  64  64  66  66  69   Post FEV1/FCV % %    65    70   FEV1-Pre L 1.65  1.72  1.78  1.77  1.91  1.85  2.01   FEV1-Predicted Pre % 94  97  100  98  103  99  108   FEV1-Post L    1.79    2.07   DLCO uncorrected ml/min/mmHg 10.04  13.53  10.81  11.55  12.85  10.95  13.33   DLCO UNC% % 49  67  53  57  47  40  49   DLCO corrected ml/min/mmHg 10.01  13.53  10.92  11.66  12.56   13.63   DLCO COR %Predicted % 49  67  54  57  46   50   DLVA Predicted % 59  84  59  68  51  48  60   TLC L    5.10    5.05   TLC % Predicted %    95    94   RV % Predicted %    99    85        LAB RESULTS last 96 hours No results found.       has a past medical history of Allergic rhinitis, Arthritis, Bronchiectasis (HCC), Colon polyps, Diverticulosis, Emphysema of lung (HCC), GERD (gastroesophageal reflux disease), HSV-1 (herpes simplex virus 1) infection, Hyperparathyroidism (HCC), Hypertension, and Pulmonary nodule (07/10/2017).   reports that she quit smoking about 9 years ago. Her smoking use included cigarettes. She started smoking about 67 years ago. She has a 58 pack-year smoking history. She has never used smokeless tobacco.  Past Surgical History:  Procedure Laterality Date   ABDOMINAL HYSTERECTOMY     ovaries spared   CESAREAN SECTION  1969    COLONOSCOPY     DIRECT LARYNGOSCOPY N/A 10/21/2014   Procedure: DIRECT LARYNGOSCOPY;  Surgeon: Drema Halon, MD;  Location: [facility];  Service: ENT;  Laterality: N/A;   EXCISION NASAL MASS Right 10/21/2014   Procedure: EXCISION NASOPHARYNGEAL MASS;  Surgeon: Drema Halon, MD;  Location: [facility];  Service: ENT;  Laterality: Right;   EYE SURGERY     cataracts   MASS EXCISION Right 10/21/2014   Procedure: RIGHT NECK NODE BIOPSY;  Surgeon: Drema Halon, MD;  Location: [facility];  Service: ENT;  Laterality: Right;    Allergies  Allergen Reactions   Ace Inhibitors Cough   Lemon Oil Hives    Reaction to lemons   Natural Vegetable Orange [Psyllium]     oranges   Shellfish Allergy Swelling and Hives    Immunization History  Administered Date(s) Administered   DTaP 07/30/2011   Fluad Quad(high Dose 65+) 08/25/2020   Influenza, High Dose Seasonal PF 06/07/2016, 06/25/2017, 09/22/2018, 07/19/2019, 07/25/2021, 07/17/2023   Influenza-Unspecified 08/02/2022   Moderna Covid-19 Fall Seasonal Vaccine 66yrs & older 09/04/2022   PFIZER(Purple Top)SARS-COV-2 Vaccination 10/05/2019,  10/25/2019, 08/25/2020   Pfizer Covid-19 Vaccine Bivalent Booster 34yrs & up 07/21/2021   Pneumococcal Conjugate-13 11/22/2015   Pneumococcal Polysaccharide-23 05/11/2013   Pneumococcal-Unspecified 01/07/2002   Respiratory Syncytial Virus Vaccine,Recomb Aduvanted(Arexvy) 09/04/2022   Td 01/07/2002, 05/07/2012   Unspecified SARS-COV-2 Vaccination 07/29/2023    Family History  Problem Relation Age of Onset   Hypertension Mother    Stroke Mother    Heart attack Father    Breast cancer Sister        29s   Lung cancer Sister 59   Breast cancer Sister    Cancer Brother    Prostate cancer Brother    Other Brother        Heart Issues   Cancer Brother    Breast cancer Maternal Grandmother    Lung disease Neg Hx    Rheumatologic disease Neg Hx     Colon cancer Neg Hx    Esophageal cancer Neg Hx    Pancreatic cancer Neg Hx    Liver cancer Neg Hx      Current Outpatient Medications:    acetaminophen (TYLENOL) 500 MG tablet, Take 500 mg by mouth every 6 (six) hours as needed (pain)., Disp: , Rfl:    amLODipine (NORVASC) 10 MG tablet, TAKE 1 TABLET BY MOUTH EVERY DAY FOR BLOOD PRESSURE, Disp: 90 tablet, Rfl: 3   Ascorbic Acid (VITAMIN C) 100 MG tablet, Take 100 mg by mouth daily., Disp: , Rfl:    azelastine (ASTELIN) 0.1 % nasal spray, , Disp: , Rfl:    halobetasol (ULTRAVATE) 0.05 % cream, Apply topically 2 (two) times daily. Apply to Eczema Rash 2 x/day, Disp: 100 g, Rfl: 11   hydrocortisone 2.5 % cream, , Disp: , Rfl:    Magnesium 250 MG TABS, Take 1 tablet (250 mg total) by mouth daily., Disp: 30 tablet, Rfl: 0   Naphazoline HCl (CLEAR EYES OP), Place 1 drop into both eyes daily., Disp: , Rfl:    OVER THE COUNTER MEDICATION, Takes OTC eczema cream PRN., Disp: , Rfl:    pantoprazole (PROTONIX) 40 MG tablet, Take 1 tablet (40 mg total) by mouth daily., Disp: 90 tablet, Rfl: 5   rosuvastatin (CRESTOR) 5 MG tablet, TAKE 1 TABLET IN THE EVENING 5 DAYS A WEEK FOR CHOLESTEROL TO REDUCE HEART ATTACK/STROKE RISK, Disp: 60 tablet, Rfl: 1   triamterene-hydrochlorothiazide (MAXZIDE-25) 37.5-25 MG tablet, TAKE 1 TABLET DAILY FOR BP & FLUID, Disp: 90 tablet, Rfl: 3   Wheat Dextrin (BENEFIBER PO), Take 1 Dose by mouth daily., Disp: , Rfl:    nitroGLYCERIN (NITROSTAT) 0.4 MG SL tablet, Place 0.4 mg under the tongue every 5 (five) minutes as needed for chest pain. (Patient not taking: Reported on 11/26/2023), Disp: , Rfl:       Objective:   Vitals:   11/26/23 0954  BP: 128/80  Pulse: 72  SpO2: 100%  Weight: 145 lb 9.6 oz (66 kg)  Height: 5\' 5"  (1.651 m)    Estimated body mass index is 24.23 kg/m as calculated from the following:   Height as of this encounter: 5\' 5"  (1.651 m).   Weight as of this encounter: 145 lb 9.6 oz (66  kg).  @WEIGHTCHANGE @  [employer]   11/26/23 0954  Weight: 145 lb 9.6 oz (66 kg)     Physical Exam   General: No distress. Looks well O2 at rest: no Cane present: no Sitting in wheel chair: no Frail: no Obese: no Neuro: Alert and Oriented x 3. GCS 15. Speech  normal Psych: Pleasant Resp:  Barrel Chest - no.  Wheeze - n, Crackles - oNO No overt respiratory distress CVS: Normal heart sounds. Murmurs - ono Ext: Stigmata of Connective Tissue Disease - no HEENT: Normal upper airway. PEERL +. No post nasal drip        Assessment:       ICD-10-CM   1. ILD (interstitial lung disease) (HCC)  J84.9 Antinuclear Antib (ANA)    Anti-DNA antibody, double-stranded    CT Chest High Resolution    Pulmonary function test    2. ANA positive  R76.8 Antinuclear Antib (ANA)    Anti-DNA antibody, double-stranded    CT Chest High Resolution    Pulmonary function test    3. Stopped smoking with greater than 40 pack year history  Z87.891 Antinuclear Antib (ANA)    Anti-DNA antibody, double-stranded    CT Chest High Resolution    Pulmonary function test         Plan:     Patient Instructions   ILD (interstitial lung disease) (HCC) Prior ANA and DS DNA positive 2017 at low level  - clinically stable since last visit 2023 - no evidence of autoimmune diseaes   Plan - based on shared decision making - will hold off on anti-fibrotics - but we need to restage  - check ANA, DS-DNA 11/26/2023 - do spiro and dlco in 6 months  - do HRCT in 6 months   Pulmonary emphysema, unspecified emphysema type (HCC)  -Too bad Spiriva and albuterol have not helped you  Plan -Expectant follow-up   Solitary Pulmonary Nodule - stable 2018 -> 2021 august. Not reported Jan 2023  Plan  - no further followup   Elevated IgE level in 2017  Plan  -Per Dr. Madie Reno allergist  Followup  -6 months on a 10-minute slot - symptoms socre and simple walkng desaturation test at followup  - but  after CT and PFT   FOLLOWUP Return in about 6 months (around 05/28/2024) for 15 min visit, after Cleda Daub and DLCO, after HRCT chest, with Dr Marchelle Gearing, Face to Face Visit.    SIGNATURE    Dr. Kalman Shan, M.D., F.C.C.P,  Pulmonary and Critical Care Medicine Staff Physician, [facility] Director - Interstitial Lung Disease  Program  Pulmonary Fibrosis [facility] at [facility] [place], [place], [address]  Pager: [phone], If no answer or between  15:00h - 7:00h: call [phone] Telephone: [phone]  10:27 AM 11/26/2023

## 2023-11-26 NOTE — Patient Instructions (Addendum)
  ILD (interstitial lung disease) (HCC) Prior ANA and DS DNA positive 2017 at low level  - clinically stable since last visit 2023 - no evidence of autoimmune diseaes   Plan - based on shared decision making - will hold off on anti-fibrotics - but we need to restage  - check ANA, DS-DNA 11/26/2023 - do spiro and dlco in 6 months  - do HRCT in 6 months   Pulmonary emphysema, unspecified emphysema type (HCC)  -Too bad Spiriva and albuterol have not helped you  Plan -Expectant follow-up   Solitary Pulmonary Nodule - stable 2018 -> 2021 august. Not reported Jan 2023  Plan  - no further followup   Elevated IgE level in 2017  Plan  -Per Dr. Madie Reno allergist  Followup  -6 months on a 10-minute slot - symptoms socre and simple walkng desaturation test at followup  - but after CT and PFT

## 2023-12-02 LAB — ANA: Anti Nuclear Antibody (ANA): NEGATIVE

## 2023-12-02 LAB — ANTI-DNA ANTIBODY, DOUBLE-STRANDED

## 2023-12-10 ENCOUNTER — Encounter: Payer: Medicare PPO | Admitting: Nurse Practitioner

## 2023-12-30 ENCOUNTER — Encounter: Payer: Medicare PPO | Admitting: Nurse Practitioner

## 2024-01-21 ENCOUNTER — Encounter (HOSPITAL_COMMUNITY): Payer: Self-pay

## 2024-01-28 ENCOUNTER — Encounter: Payer: Self-pay | Admitting: Family Medicine

## 2024-01-28 ENCOUNTER — Ambulatory Visit: Admitting: Family Medicine

## 2024-01-28 VITALS — BP 134/79 | HR 89 | Ht 62.0 in | Wt 142.0 lb

## 2024-01-28 DIAGNOSIS — L918 Other hypertrophic disorders of the skin: Secondary | ICD-10-CM

## 2024-01-28 DIAGNOSIS — Z7689 Persons encountering health services in other specified circumstances: Secondary | ICD-10-CM

## 2024-01-28 DIAGNOSIS — J449 Chronic obstructive pulmonary disease, unspecified: Secondary | ICD-10-CM

## 2024-01-28 DIAGNOSIS — I10 Essential (primary) hypertension: Secondary | ICD-10-CM | POA: Diagnosis not present

## 2024-01-28 DIAGNOSIS — N182 Chronic kidney disease, stage 2 (mild): Secondary | ICD-10-CM | POA: Diagnosis not present

## 2024-01-28 DIAGNOSIS — R7303 Prediabetes: Secondary | ICD-10-CM

## 2024-01-28 DIAGNOSIS — J849 Interstitial pulmonary disease, unspecified: Secondary | ICD-10-CM | POA: Diagnosis not present

## 2024-01-28 DIAGNOSIS — E210 Primary hyperparathyroidism: Secondary | ICD-10-CM

## 2024-01-28 MED ORDER — BLOOD GLUCOSE TEST VI STRP: 1.0000 | ORAL_STRIP | Freq: Every day | 0 refills | Status: AC

## 2024-01-28 MED ORDER — BLOOD GLUCOSE MONITORING SUPPL DEVI: 1.0000 | Freq: Every day | 0 refills | Status: AC

## 2024-01-28 MED ORDER — LANCET DEVICE MISC: 1.0000 | Freq: Every day | 0 refills | Status: AC

## 2024-01-28 MED ORDER — LANCETS MISC. MISC: 1.0000 | Freq: Every day | 0 refills | Status: AC

## 2024-01-28 NOTE — Assessment & Plan Note (Addendum)
 Not on inhalers.  No respiratory distress.  Lung exam normal.  Also has interstitial lung disease.  Sees pulmonology.  Former smoker.

## 2024-01-28 NOTE — Patient Instructions (Signed)
 It was nice to see you today,  We addressed the following topics today: -I have sent in your glucometer supplies for you.  Check this when you are feeling you are lightheaded or you feel weak. - We are getting some labs today and we will let you know the results when get them - I attempted to freeze off the skin tag on your eyelid.  If you have any complications from this or have any questions let us  know.  It should follow-up in the next 1 to 2 weeks but if it does not we can always attempt to remove it with a scalpel.  Have a great day,  Etha Henle, MD

## 2024-01-28 NOTE — Assessment & Plan Note (Signed)
 Sees pulmonology.  Not on any treatment currently.  No respiratory distress.  Not on supplemental oxygen.

## 2024-01-28 NOTE — Assessment & Plan Note (Signed)
 Has seen endocrinology for this.  Has had recent DEXA scan that showed osteopenia.  Continue to monitor calcium  levels.

## 2024-01-28 NOTE — Progress Notes (Signed)
 New Patient Office Visit  Subjective   Patient ID: Catherine Parks, female    DOB: 1940/09/27  Age: 83 y.o. MRN: [id number]  CC:  Chief Complaint  Patient presents with   Establish Care    HPI Catherine Parks presents to establish care  Subjective - Widowed female, former patient of Dr. Cassondra Cliff since 1980s - Interstitial lung disease, followed by pulmonologist Dr. Bertrum Brodie for 3 years - Abdominal pain occurring mostly every mornings, eventually resolves without intervention - Has tried eliminating certain foods without relief - Reports episodes of feeling faint, nervous, nauseated when going out in morning without eating - Had one episode of fainting at church - Drinks protein shakes in morning before going out which sometimes helps - Concerned about possible diabetes due to family history - Skin tag near eye causing discomfort, occasionally painful, present >1 month - Interested in eye exam, unsure whether to see optometrist or ophthalmologist  Medications: pantoprazole  and famotidine  both taken in morning (for gastritis), unspecified medication for hypercalcemia  PMH: interstitial lung disease, COPD (quit smoking 7-8 years ago), hypercholesterolemia, hypertension, pre-diabetes, gastritis, hiatal hernia, hypercalcemia (parathyroid  issue), osteopenia  PSH: nasal surgery for polyp, hysterectomy, cesarean section, cataract surgery  FH: two sisters with breast cancer (one diagnosed in 64s, died; other still living with recent lung issue), two brothers with unspecified cancer, diabetes runs in family  Social Hx: widowed, former smoker (quit 7-8 years ago), former alcohol use (none currently), has adult children and grandchildren (ages 75-27)  ROS: reports morning abdominal pain, episodes of feeling faint/nervous when not eating adequately in mornings, denies reflux  Outpatient Encounter Medications as of 01/28/2024  Medication Sig   acetaminophen  (TYLENOL ) 500 MG tablet Take  500 mg by mouth every 6 (six) hours as needed (pain).   amLODipine  (NORVASC ) 10 MG tablet TAKE 1 TABLET BY MOUTH EVERY DAY FOR BLOOD PRESSURE   Ascorbic Acid (VITAMIN C) 100 MG tablet Take 100 mg by mouth daily.   azelastine (ASTELIN) 0.1 % nasal spray    Blood Glucose Monitoring Suppl DEVI 1 each by Does not apply route daily. May substitute to any manufacturer covered by patient's insurance.   Glucose Blood (BLOOD GLUCOSE TEST STRIPS) STRP 1 each by In Vitro route daily. May substitute to any manufacturer covered by patient's insurance.   halobetasol  (ULTRAVATE ) 0.05 % cream Apply topically 2 (two) times daily. Apply to Eczema Rash 2 x/day   hydrocortisone 2.5 % cream    Lancet Device MISC 1 each by Does not apply route daily. May substitute to any manufacturer covered by patient's insurance.   Lancets Misc. MISC 1 each by Does not apply route daily. May substitute to any manufacturer covered by patient's insurance.   Magnesium  250 MG TABS Take 1 tablet (250 mg total) by mouth daily.   Naphazoline HCl (CLEAR EYES OP) Place 1 drop into both eyes daily.   OVER THE COUNTER MEDICATION Takes OTC eczema cream PRN.   pantoprazole  (PROTONIX ) 40 MG tablet Take 1 tablet (40 mg total) by mouth daily.   rosuvastatin  (CRESTOR ) 5 MG tablet TAKE 1 TABLET IN THE EVENING 5 DAYS A WEEK FOR CHOLESTEROL TO REDUCE HEART ATTACK/STROKE RISK   triamterene -hydrochlorothiazide (MAXZIDE-25) 37.5-25 MG tablet TAKE 1 TABLET DAILY FOR BP & FLUID   Wheat Dextrin (BENEFIBER PO) Take 1 Dose by mouth daily.   nitroGLYCERIN  (NITROSTAT ) 0.4 MG SL tablet Place 0.4 mg under the tongue every 5 (five) minutes as needed for chest pain. (Patient not taking: Reported  on 01/28/2024)   No facility-administered encounter medications on file as of 01/28/2024.    Past Medical History:  Diagnosis Date   Allergic rhinitis    Arthritis    Bronchiectasis (HCC)    Colon polyps    Diverticulosis    Emphysema of lung (HCC)    GERD  (gastroesophageal reflux disease)    HSV-1 (herpes simplex virus 1) infection    Hyperparathyroidism (HCC)    Hypertension    Pulmonary nodule 07/10/2017   CT 01/2017, CT 07/2017 - stable on follow up, no further follow up recommended.    Past Surgical History:  Procedure Laterality Date   ABDOMINAL HYSTERECTOMY     ovaries spared   CESAREAN SECTION  1969   COLONOSCOPY     DIRECT LARYNGOSCOPY N/A 10/21/2014   Procedure: DIRECT LARYNGOSCOPY;  Surgeon: Prescott Brodie, MD;  Location: [facility];  Service: ENT;  Laterality: N/A;   EXCISION NASAL MASS Right 10/21/2014   Procedure: EXCISION NASOPHARYNGEAL MASS;  Surgeon: Prescott Brodie, MD;  Location: [facility];  Service: ENT;  Laterality: Right;   EYE SURGERY     cataracts   MASS EXCISION Right 10/21/2014   Procedure: RIGHT NECK NODE BIOPSY;  Surgeon: Prescott Brodie, MD;  Location: [facility];  Service: ENT;  Laterality: Right;    Family History  Problem Relation Age of Onset   Hypertension Mother    Stroke Mother    Heart attack Father    Breast cancer Sister        64s   Lung cancer Sister 61   Breast cancer Sister    Cancer Brother    Prostate cancer Brother    Other Brother        Heart Issues   Cancer Brother    Breast cancer Maternal Grandmother    Lung disease Neg Hx    Rheumatologic disease Neg Hx    Colon cancer Neg Hx    Esophageal cancer Neg Hx    Pancreatic cancer Neg Hx    Liver cancer Neg Hx     Social History   Socioeconomic History   Marital status: Widowed    Spouse name: Not on file   Number of children: Not on file   Years of education: Not on file   Highest education level: Not on file  Occupational History   Not on file  Tobacco Use   Smoking status: Former    Current packs/day: 0.00    Average packs/day: 1 pack/day for 58.0 years (58.0 ttl pk-yrs)    Types: Cigarettes    Start date: 10/26/1956    Quit date: 09/23/2014    Years since  quitting: 9.3   Smokeless tobacco: Never  Vaping Use   Vaping status: Never Used  Substance and Sexual Activity   Alcohol use: No    Alcohol/week: 0.0 standard drinks of alcohol   Drug use: No   Sexual activity: Not Currently    Partners: Male    Birth control/protection: Post-menopausal  Other Topics Concern   Not on file  Social History Narrative   Originally from [place]. She previously worked as a Runner, broadcasting/film/video. Currently has a dog. Denies any bird exposure. No mold or hot tub exposure.    Social Drivers of Corporate investment banker Strain: Not on file  Food Insecurity: Not on file  Transportation Needs: Not on file  Physical Activity: Not on file  Stress: Not on file  Social Connections:  Not on file  Intimate Partner Violence: Not on file    ROS     Objective   BP 134/79   Pulse 89   Ht 5\' 2"  (1.575 m)   Wt 142 lb (64.4 kg)   SpO2 99%   BMI 25.97 kg/m   Physical Exam General: Alert, oriented HEENT: PERRLA, EOMI, moist mucosa.  Small skin tag on the right upper eyelid CV: Regular rate and rhythm Pulmonary: Lungs clear bilaterally no wheeze or crackles GI: Soft, normal bowel sounds Extremities: No pedal edema MSK: Strength good bilaterally Psych: Pleasant affect      Assessment & Plan:   Encounter to establish care  Essential hypertension -     CBC with Differential/Platelet  CKD (chronic kidney disease) stage 2, GFR 60-89 ml/min -     Comprehensive metabolic panel with GFR -     CBC with Differential/Platelet  Prediabetes Assessment & Plan: Patient complains of symptoms of shakiness, dizziness occasionally in the morning prior to meals.  Sending in glucometer and advised her to check her blood sugar when she gets symptoms like this to see if they are related to her blood sugar.  Getting A1c today.  Orders: -     Hemoglobin A1c  ILD (interstitial lung disease) (HCC) Assessment & Plan: Sees pulmonology.  Not on any treatment currently.  No  respiratory distress.  Not on supplemental oxygen.   Hypercalcemia Assessment & Plan: Has seen endocrinology for this.  Has had recent DEXA scan that showed osteopenia.  Continue to monitor calcium  levels.   COPD, mild (HCC) Assessment & Plan: Not on inhalers.  No respiratory distress.  Lung exam normal.  Also has interstitial lung disease.  Sees pulmonology.  Former smoker.   Skin tag Assessment & Plan: On the right upper eyelid just above the palpebral margin.  Patient states it has started to become painful over the weekend.  Discussed different options including excision or cryotherapy.  Patient agreeable to cryotherapy.  A pair of hemostats were dipped in liquid nitrogen and applied to the skin tag for approximately 15 seconds.  This was repeated.  Pt tolerated procedure well.    Other orders -     Blood Glucose Monitoring Suppl; 1 each by Does not apply route daily. May substitute to any manufacturer covered by patient's insurance.  Dispense: 1 each; Refill: 0 -     Blood Glucose Test; 1 each by In Vitro route daily. May substitute to any manufacturer covered by patient's insurance.  Dispense: 30 each; Refill: 0 -     Lancet Device; 1 each by Does not apply route daily. May substitute to any manufacturer covered by patient's insurance.  Dispense: 1 each; Refill: 0 -     Lancets Misc.; 1 each by Does not apply route daily. May substitute to any manufacturer covered by patient's insurance.  Dispense: 30 each; Refill: 0    Return in about 4 months (around 05/30/2024) for physical.   [name], MD

## 2024-01-28 NOTE — Assessment & Plan Note (Signed)
 Patient complains of symptoms of shakiness, dizziness occasionally in the morning prior to meals.  Sending in glucometer and advised her to check her blood sugar when she gets symptoms like this to see if they are related to her blood sugar.  Getting A1c today.

## 2024-01-28 NOTE — Assessment & Plan Note (Addendum)
 On the right upper eyelid just above the palpebral margin.  Patient states it has started to become painful over the weekend.  Discussed different options including excision or cryotherapy.  Patient agreeable to cryotherapy.  A pair of hemostats were dipped in liquid nitrogen and applied to the skin tag for approximately 15 seconds.  This was repeated.  Pt tolerated procedure well.

## 2024-01-29 LAB — CBC WITH DIFFERENTIAL/PLATELET
Basophils Absolute: 0 10*3/uL (ref 0.0–0.2)
Basos: 1 %
EOS (ABSOLUTE): 0.2 10*3/uL (ref 0.0–0.4)
Eos: 5 %
Hematocrit: 41.1 % (ref 34.0–46.6)
Hemoglobin: 13.3 g/dL (ref 11.1–15.9)
Immature Grans (Abs): 0 10*3/uL (ref 0.0–0.1)
Immature Granulocytes: 0 %
Lymphocytes Absolute: 1.4 10*3/uL (ref 0.7–3.1)
Lymphs: 30 %
MCH: 29.2 pg (ref 26.6–33.0)
MCHC: 32.4 g/dL (ref 31.5–35.7)
MCV: 90 fL (ref 79–97)
Monocytes Absolute: 0.5 10*3/uL (ref 0.1–0.9)
Monocytes: 10 %
Neutrophils Absolute: 2.6 10*3/uL (ref 1.4–7.0)
Neutrophils: 54 %
Platelets: 377 10*3/uL (ref 150–450)
RBC: 4.55 x10E6/uL (ref 3.77–5.28)
RDW: 14.7 % (ref 11.7–15.4)
WBC: 4.7 10*3/uL (ref 3.4–10.8)

## 2024-01-29 LAB — COMPREHENSIVE METABOLIC PANEL WITH GFR
ALT: 13 IU/L (ref 0–32)
AST: 17 IU/L (ref 0–40)
Albumin: 4.6 g/dL (ref 3.7–4.7)
Alkaline Phosphatase: 78 IU/L (ref 44–121)
BUN/Creatinine Ratio: 20 (ref 12–28)
BUN: 17 mg/dL (ref 8–27)
Bilirubin Total: 0.3 mg/dL (ref 0.0–1.2)
CO2: 19 mmol/L — ABNORMAL LOW (ref 20–29)
Calcium: 10.6 mg/dL — ABNORMAL HIGH (ref 8.7–10.3)
Chloride: 104 mmol/L (ref 96–106)
Creatinine, Ser: 0.86 mg/dL (ref 0.57–1.00)
Globulin, Total: 3 g/dL (ref 1.5–4.5)
Glucose: 96 mg/dL (ref 70–99)
Potassium: 3.8 mmol/L (ref 3.5–5.2)
Sodium: 142 mmol/L (ref 134–144)
Total Protein: 7.6 g/dL (ref 6.0–8.5)
eGFR: 67 mL/min/{1.73_m2} (ref >59)

## 2024-01-29 LAB — HEMOGLOBIN A1C
Est. average glucose Bld gHb Est-mCnc: 126 mg/dL
Hgb A1c MFr Bld: 6 % — ABNORMAL HIGH (ref 4.8–5.6)

## 2024-01-30 ENCOUNTER — Ambulatory Visit: Payer: Self-pay | Admitting: Family Medicine

## 2024-02-02 NOTE — Progress Notes (Signed)
 Cryotherapy Procedure Note  Pre-operative Diagnosis: skin tag of eyelid  Post-operative Diagnosis: same  Locations: right, upper eyelid  Indications: painful  Anesthesia: not required    Procedure Details  History of allergy  to iodine: no. Pacemaker? no.  Patient informed of risks (permanent scarring, infection, light or dark discoloration, bleeding, infection, weakness, numbness and recurrence of the lesion) and benefits of the procedure and verbal informed consent obtained.  The areas are treated with liquid nitrogen therapy by soaking a pair of hemostats in liquid nitrogen and then applying the hemostats to the skin tag, frozen until ice ball extended 2 mm beyond lesion, allowed to thaw, and treated again. The patient tolerated procedure well.  The patient was instructed on post-op care, warned that there may be blister formation, redness and pain. Recommend OTC analgesia as needed for pain.  Condition: Stable  Complications: none.  Plan: 1. Instructed to keep the area dry and covered for 24-48h and clean thereafter. 2. Warning signs of infection were reviewed.   3. Recommended that the patient use OTC analgesics as needed for pain.  4. Return in 4 months.

## 2024-02-11 ENCOUNTER — Telehealth: Payer: Self-pay | Admitting: Family Medicine

## 2024-02-11 NOTE — Telephone Encounter (Unsigned)
 Copied from CRM [id number]. Topic: Clinical - Medication Refill >> Feb 11, 2024  8:53 AM Alpha Arts wrote: Medication: rosuvastatin  (CRESTOR ) 5 MG tablet   Has the patient contacted their pharmacy? Yes (Agent: If no, request that the patient contact the pharmacy for the refill. If patient does not wish to contact the pharmacy document the reason why and proceed with request.) (Agent: If yes, when and what did the pharmacy advise?)  This is the patient's preferred pharmacy:  [facility] [phone] Jonette Nestle, [place] - [address] [place] [address] Phone: [phone] Fax: [phone]  Is this the correct pharmacy for this prescription? Yes If no, delete pharmacy and type the correct one.   Has the prescription been filled recently? Yes  Is the patient out of the medication? Yes  Has the patient been seen for an appointment in the last year OR does the patient have an upcoming appointment? Yes  Can we respond through MyChart? Yes  Agent: Please be advised that Rx refills may take up to 3 business days. We ask that you follow-up with your pharmacy.

## 2024-02-12 MED ORDER — ROSUVASTATIN CALCIUM 5 MG PO TABS: ORAL_TABLET | ORAL | 1 refills | Status: DC

## 2024-03-02 ENCOUNTER — Encounter: Payer: Self-pay | Admitting: Family Medicine

## 2024-03-02 ENCOUNTER — Ambulatory Visit: Admitting: Family Medicine

## 2024-03-02 VITALS — BP 133/79 | HR 96 | Ht 62.0 in | Wt 143.0 lb

## 2024-03-02 DIAGNOSIS — M549 Dorsalgia, unspecified: Secondary | ICD-10-CM

## 2024-03-02 DIAGNOSIS — M47812 Spondylosis without myelopathy or radiculopathy, cervical region: Secondary | ICD-10-CM | POA: Insufficient documentation

## 2024-03-02 NOTE — Progress Notes (Signed)
   Acute Office Visit  Subjective:     Patient ID: Catherine Parks  Catherine Parks, female    DOB: 11/13/1940, 83 y.o.   MRN: [id number]  Chief Complaint  Patient presents with   Back Pain   Neck Pain    HPI Subjective - Neck pain radiating to both arms, left worse than right - Pain started 2 weeks ago - Pain woke patient up, affecting entire left side down to mid-back - No numbness or tingling - No weakness in hands - Taking acetaminophen  3 tablets once daily - Using heating pad for relief - No topical treatments - Reports hearing cracking sounds when moving neck - Pain less severe when sleeping flat - Changed pillows about a month ago - Reports family history of arthritis (sister, 59 years old, with severe arthritis in knees and legs)  Medications Acetaminophen  3 tablets once daily for neck pain  PMH, PSH, FH, Social Hx PMH: Previous wrist issues in 82s, declined surgery at age 75, T12 burst fracture (compression) PSH: None mentioned FH: Sister with severe arthritis Social Hx: 64 year old mattress, recently changed pillows  ROS Musculoskeletal: Neck pain radiating to arms, worse on left, cracking sensation with movement Neurological: Denies numbness, tingling, or weakness  ROS      Objective:    BP 133/79   Pulse 96   Ht 5' 2 (1.575 m)   Wt 143 lb (64.9 kg)   SpO2 100%   BMI 26.16 kg/m    Physical Exam Gen: alert, oriented Msk: normal rom in the neck.  No UE weakness. TTP midline over T2-3 region.   Psych; pleasant  No results found for any visits on 03/02/24.      Assessment & Plan:   Cervical spondylosis Assessment & Plan: Cervical pain with radiation to arms, likely degenerative changes - Obtain cervical spine x-ray - Continue acetaminophen  as needed - Consider Voltaren gel for topical pain relief - Recommend cervical support pillow - Discussed conservative management including heat therapy and proper neck support - Discussed possibility of future  referral to orthopedics if symptoms persist - Discussed possibility of steroid injections if conservative management fails   Upper back pain -     DG Cervical Spine Complete; Future -     DG Thoracic Spine 2 View; Future     Return if symptoms worsen or fail to improve.  Catherine Pintos, MD

## 2024-03-02 NOTE — Assessment & Plan Note (Signed)
 Cervical pain with radiation to arms, likely degenerative changes - Obtain cervical spine x-ray - Continue acetaminophen  as needed - Consider Voltaren gel for topical pain relief - Recommend cervical support pillow - Discussed conservative management including heat therapy and proper neck support - Discussed possibility of future referral to orthopedics if symptoms persist - Discussed possibility of steroid injections if conservative management fails

## 2024-03-02 NOTE — Patient Instructions (Signed)
 It was nice to see you today,  We addressed the following topics today: - I would recommend continuing to use tylenol  as needed.  You can take 1000mg  of tylenol  every 8 hours.   - use topical heat and topical pain relievers like voltaren gel as needed.  - I have sent in an order for an xray.  You can go to [facility] on [address] to have this done.    Have a great day,  Etha Henle, MD

## 2024-03-04 ENCOUNTER — Ambulatory Visit
Admission: RE | Admit: 2024-03-04 | Discharge: 2024-03-04 | Disposition: A | Discharge status: Home / Self Care | Source: Ambulatory Visit | Attending: Family Medicine | Admitting: Family Medicine

## 2024-03-04 ENCOUNTER — Other Ambulatory Visit: Payer: Self-pay | Admitting: Family Medicine

## 2024-03-04 DIAGNOSIS — M549 Dorsalgia, unspecified: Secondary | ICD-10-CM

## 2024-03-04 DIAGNOSIS — M47812 Spondylosis without myelopathy or radiculopathy, cervical region: Secondary | ICD-10-CM

## 2024-03-14 ENCOUNTER — Ambulatory Visit: Payer: Self-pay | Admitting: Family Medicine

## 2024-04-09 ENCOUNTER — Ambulatory Visit: Payer: Medicare PPO | Admitting: Nurse Practitioner

## 2024-04-14 NOTE — Telephone Encounter (Signed)
 Copied from CRM [id number]. Topic: Appointments - Appointment Scheduling >> Apr 14, 2024  9:30 AM Benton KIDD wrote: Patient/patient representative is calling to schedule an appointment. Refer to attachments for appointment information.  Patient calling to get scheduled for her 6 month follow up in August that's on the recall list but dr geronimo schedule is not open that far out . Please get patient scheduled for ramaswamy   Attempted to call patient and sent MyChart message.

## 2024-04-22 ENCOUNTER — Other Ambulatory Visit: Payer: Self-pay | Admitting: Nurse Practitioner

## 2024-04-22 DIAGNOSIS — I10 Essential (primary) hypertension: Secondary | ICD-10-CM

## 2024-04-23 ENCOUNTER — Other Ambulatory Visit: Payer: Self-pay | Admitting: Gastroenterology

## 2024-04-23 ENCOUNTER — Telehealth: Payer: Self-pay | Admitting: Family Medicine

## 2024-04-23 DIAGNOSIS — K581 Irritable bowel syndrome with constipation: Secondary | ICD-10-CM

## 2024-04-23 NOTE — Telephone Encounter (Unsigned)
 Copied from CRM [id number]. Topic: Clinical - Medication Question >> Apr 23, 2024  1:37 PM Donee H wrote: Reason for CRM: Patient called regarding needing a refill on medications. Patient stated medications were previous prescribed by her previous pcp but provider has passed away. She is wanting to know being that Toribio Slain is now her pcp would he be able to place a refill for medications. Medications:amLODipine  (NORVASC ) 10 MG tablet triamterene -hydrochlorothiazide (MAXZIDE-25) 37.5-25 MG tablet pantoprazole  (PROTONIX ) 40 MG tablet famotidine  (PEPCID ) 40 MG tablet  She would like them sent to:  [id number] [address], [place] - [address] [place] [address] Phone: [phone] Fax: [phone]   Callback number if need to follow up with patient is [phone]

## 2024-04-26 ENCOUNTER — Other Ambulatory Visit: Payer: Self-pay | Admitting: Family Medicine

## 2024-04-26 DIAGNOSIS — I10 Essential (primary) hypertension: Secondary | ICD-10-CM

## 2024-04-26 DIAGNOSIS — K581 Irritable bowel syndrome with constipation: Secondary | ICD-10-CM

## 2024-04-26 MED ORDER — TRIAMTERENE-HCTZ 37.5-25 MG PO TABS: ORAL_TABLET | ORAL | 3 refills | Status: AC

## 2024-04-26 MED ORDER — FAMOTIDINE 40 MG PO TABS
40.0000 mg | ORAL_TABLET | Freq: Every day | ORAL | 3 refills | Status: AC
Start: 2024-04-26 — End: ?

## 2024-04-26 MED ORDER — AMLODIPINE BESYLATE 10 MG PO TABS: ORAL_TABLET | ORAL | 3 refills | Status: AC

## 2024-04-26 MED ORDER — PANTOPRAZOLE SODIUM 40 MG PO TBEC: 40.0000 mg | DELAYED_RELEASE_TABLET | Freq: Every day | ORAL | 3 refills | Status: AC

## 2024-04-26 NOTE — Telephone Encounter (Signed)
 Prescriptions have been sent in.

## 2024-05-06 ENCOUNTER — Ambulatory Visit: Admitting: Internal Medicine

## 2024-05-06 ENCOUNTER — Encounter: Payer: Self-pay | Admitting: Internal Medicine

## 2024-05-06 VITALS — BP 124/80 | HR 83 | Ht 62.0 in | Wt 143.0 lb

## 2024-05-06 DIAGNOSIS — J84112 Idiopathic pulmonary fibrosis: Secondary | ICD-10-CM | POA: Diagnosis not present

## 2024-05-06 DIAGNOSIS — J849 Interstitial pulmonary disease, unspecified: Secondary | ICD-10-CM | POA: Diagnosis not present

## 2024-05-06 DIAGNOSIS — Z87891 Personal history of nicotine dependence: Secondary | ICD-10-CM

## 2024-05-06 DIAGNOSIS — R768 Other specified abnormal immunological findings in serum: Secondary | ICD-10-CM

## 2024-05-06 LAB — PULMONARY FUNCTION TEST
DL/VA % pred: 58 %
DL/VA: 2.34 ml/min/mmHg/L
DLCO unc % pred: 49 %
DLCO unc: 9.86 ml/min/mmHg
FEF 25-75 Pre: 0.75 L/s
FEF2575-%Pred-Pre: 55 %
FEV1-%Pred-Pre: 77 %
FEV1-Pre: 1.57 L
FEV1FVC-%Pred-Pre: 83 %
FEV6-%Pred-Pre: 97 %
FEV6-Pre: 2.5 L
FEV6FVC-%Pred-Pre: 103 %
FVC-%Pred-Pre: 93 %
FVC-Pre: 2.55 L
Pre FEV1/FVC ratio: 61 %
Pre FEV6/FVC Ratio: 98 %

## 2024-05-06 NOTE — Patient Instructions (Addendum)
 ILD (interstitial lung disease) (HCC) - IPF Prior ANA and DS DNA positive 2017 at low level, later negative March 2025  - clinically stable since last visit 2023 and march 2025 in terms of symptooms, exercise test and FVC part of breathing test - but dlco test suggests fibrosis might be worsening   Plan - based on shared decision making - will hold off on anti-fibrotics - but we need to continue monitoring  -  - do HRCT in 4 months  - chjange upcoming CT to 4 months - do echo in 4 month  Pulmonary emphysema, unspecified emphysema type (HCC)  -Too bad Spiriva  and albuterol have not helped you  Plan -Expectant follow-up   Solitary Pulmonary Nodule - stable 2018 -> 2021 august. Not reported Jan 2023  Plan  - no further followup   Elevated IgE level in 2017  Plan  -Per Dr. Altamease allergist  Followup  -4 months on a 15-minute slot - symptoms socre and simple walkng desaturation test at followup  - but after CT and echo

## 2024-05-06 NOTE — Progress Notes (Signed)
Spiro/DLCO performed today. 

## 2024-05-06 NOTE — Progress Notes (Signed)
 @Patient  ID: Catherine  GABREILLE Parks, female    DOB: 16-Apr-1941, 83 y.o.   MRN: [id number]  Chief Complaint  Patient presents with   Follow-up    pft    Referring provider: Tonita Fallow, MD  HPI  83 year old female former smoker followed for mild COPD with emphysema , lung nodule and ILD    TESTS: High resolution CT chest November 2017 showed ILD changes with  basilar predominant patchy subpleural reticulation, traction bronchiectasis and mild architectural distortion with mild honeycombing at the right base.  CT chest May 2018 showed no change in numerous tiny pulmonary nodules scattered throughout the lung 6 mm dating back to October 2017, stable ILD changes CT chest November 2018 showed stable pulmonary nodules felt to be benign with no specific follow-up noted.  Chronic changes of interstitial fibrosis without change.   PFT 06/13/17: FVC 2.89 L (121%) FEV1 1.91 L (103%) FEV1/FVC 0.66 FEF 25-75 1.00 L (63%)                                                                                                                        DLCO corrected 46% 12/27/16: FVC 2.81 L (117%) FEV1 1.85 L (99%) FEV1/FVC 0.66 FEF 25-75 0.93 L (58%)                                                                                                                         DLCO uncorrected 40% 09/27/16: FVC 2.92 L (121%) FEV1 2.01 L (180%) FEV1/FVC 0.69 FEF 25-75 1.20 L (74%) negative bronchodilator response TLC 5.05 L (94%) RV 85% ERV 501% DLCO corrected 50% (Hgb 12.7)      LABS 08/02/16 Alpha-1 antitrypsin: MM (153) IgG: 1276 IgA: 658 IgM: 76 IgE: 1652 RAST Panel:  D farinae 1.75 / Cockroach 1.66 & other weak positives CRP: 0.1 ESR: 59 ANA:  Positive DS DNA Ab:  181 Smith Ab:  <0.2 RNP Ab:  <0.2 SSA:  <0.2 SSB:  <0.2 Anti-CCP:  <16  OV 02/26/19 - follow up mild COPD, ILD, pulmonary nodules Patient presents for follow-up visit today.  She was last seen by Catherine Parks on 12/24/2017.  She states that  this is been a stable interval for her.  She has not seen any increase in her shortness of breath.  She states that she has been more inactive than usual due to current COVID pandemic.  She was going to the [facility] to walk 3 times per week but the [facility]  has been closed for the last few months.  Patient tries to remain active inside her house and does lift some light weight dumbbells.  She does continue to get winded with heavy activity.  She is not on any inhalers.  Patient has known ILD changes on CT scan dating back to October 2017.  Previous CT chest showed stable changes.  Patient did have a PFT completed in office today and it is stable from last PFT.  Patient has had previous autoimmune work-up that was suggestive of lupus and was referred to rheumatology.  She is not on no suppression.   Patient has a history of pulmonary nodules on CT scan.  These nodules had shown no change and were consistent with a benign etiology.  Patient's last CT chest was in 2018.  OV 01/19/2020  Subjective:  Patient ID: Catherine  CHRISTELLA Parks, female , DOB: 06-17-1941 , age 69 y.o. , MRN: [id number] , ADDRESS: [address] [place] [address]   01/19/2020 -   Chief Complaint  Patient presents with   Follow-up    no concerns   Follow-up combination emphysema and ILD with pulmonary nodule. ANA positive and double-stranded DNA positive in 2017  HPI Catherine  M Parks 83 y.o. -returns for follow-up.  I personally not seen her in few to several years.  She is seen the nurse practitioner in between.  Nurse practitioner notes her above.  Overall she tells me she is doing stable.  She is very minimal symptoms.  Symptom score is documented below.  For positive ANA and double-stranded DNA last year she is did see a rheumatologist.  She does not know who she saw but said she was reassured that she did not have any systemic evidence of connective tissue disease.  She is not taking any inhalers.  She feels that her shortness of  breath on exertion is much improved.  She only notices it for stairs but even then it is minimal.  Her most recent test results were reviewed.  She is interested in serial monitoring and ensuring disease is stable.  She is concerned about interstitial lung disease.  She had a cardiac stress test in 2019 that was normal.  Her CT scan of the chest also shows enlarged pulmonary arteries.  She has grade 1 diastolic dysfunction on an echo.    OV 05/01/2020   Subjective:  Patient ID: Catherine  CHRISTELLA Parks, female , DOB: 03/07/1941, age 38 y.o. years. , MRN: [id number],  ADDRESS: [address] [place] [address] PCP  Catherine Fallow, MD Providers : Treatment Team:  Attending Provider: Geronimo Amel, MD   Chief Complaint  Patient presents with   Follow-up    Pt states she has been doing okay since last visit. States that she still becomes SOB with exertion.    Follow-up combination emphysema and ILD with pulmonary nodule. ANA positive and double-stranded DNA positive 181 in 2017 Positive IGE - 1652 , and positive enviromental allergy  test RAST - Nov 2017 Piror > 50 pack smoker  HPI Catherine  JAANAI Parks 83 y.o. -presents for follow-up for the above medical issues.  This is to discuss the results of investigation.  At this point in time she tells me that her dyspnea is somewhat worse.  No chest pain or cough.  In the past inhalers have not worked.  Her pulmonary function test and CT scan of the chest shows stability in both ILD and emphysema and solitary pulmonary nodule.  She feels overall that her  ILD has been there for a long time and it is been stable.  However she feels dyspnea is worse.  The interim finding is that she has new depressed ejection fraction of the left ventricle.  She has an appointment Dr. Vina Parks in June 16, 2020.  There is no associated chest pain.  There is no orthopnea there is no proximal nocturnal dyspnea.  She is reluctant about antifibrotic's because she has GI  issues although she has marked 0 for vomiting and diarrhea.  Review of the labs indicate that in 2017 she was also significantly positive IgE and other allergens.  She tells me there is no mold in the house.  She has no pet birds.  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        HRCT 04/24/20  CLINICAL DATA:  Interstitial lung disease. Increased shortness of breath with exertion the past few years.   EXAM: CT CHEST WITHOUT CONTRAST   TECHNIQUE: Multidetector CT imaging of the chest was performed following the standard protocol without intravenous contrast. High resolution imaging of the lungs, as well as inspiratory and expiratory imaging, was performed.   COMPARISON:  03/17/2019 07/17/2017.   FINDINGS: Cardiovascular: Atherosclerotic calcification of the aorta, aortic valve and coronary arteries. Pulmonic trunk is enlarged. Heart size normal. No pericardial effusion.   Mediastinum/Nodes: 1.2 cm low-attenuation left thyroid nodule. No follow-up recommended (ref: J Am Coll Radiol. 2015 Feb;12(2): 143-50).No pathologically enlarged mediastinal or axillary lymph nodes. Hilar regions are difficult to definitively evaluate without IV contrast but appear grossly unremarkable. Esophagus is slightly dilated.   Lungs/Pleura: Centrilobular and paraseptal emphysema. There is traction bronchiectasis, ground-glass and mild architectural distortion predominating in the lower lobes, as on prior exams. Minimal subpleural reticulation is seen in association. No honeycombing. 5 mm anterior left lower lobe nodule (3/120) is unchanged from 07/17/2017 and considered benign. No pleural fluid. Airway is unremarkable. Minimal air trapping.   Upper Abdomen: Visualized portions of the liver, adrenal glands, kidneys, spleen, pancreas, stomach and bowel are grossly unremarkable. No upper abdominal adenopathy.   Musculoskeletal: No worrisome  lytic or sclerotic lesions. T12 and L1 compression fractures are new. No worrisome lytic or sclerotic lesions.   IMPRESSION: 1. Pulmonary parenchymal pattern of fibrosis may be due to fibrotic nonspecific interstitial pneumonitis. Chronic hypersensitivity pneumonitis is not excluded. Findings are indeterminate for UIP per consensus guidelines: Diagnosis of Idiopathic Pulmonary Fibrosis: An Official ATS/ERS/JRS/ALAT Clinical Practice Guideline. Am JINNY Honey Crit Care Med Vol 198, Iss 5, [id number], May 17 2017. 2. Aortic atherosclerosis (ICD10-I70.0). Coronary artery calcification. 3. Enlarged pulmonic trunk, indicative of pulmonary arterial hypertension. 4.  Emphysema (ICD10-J43.9).     Electronically Signed   By: Newell Eke M.D.   On: 04/24/2020 12:47    ECHO 04/21/20   Sonographer:    Elinor Fresh RDCS (AE)  Referring Phys: [id number] Jenning   IMPRESSIONS     1. Left ventricular ejection fraction, by estimation, is 35 to 40%. The  left ventricle has moderately decreased function. The left ventricle  demonstrates global hypokinesis. Left ventricular diastolic parameters are  consistent with Grade I diastolic  dysfunction (impaired relaxation). The average left ventricular global  longitudinal strain is -15.8 %.   2. Right ventricular systolic function is mildly reduced. The right  ventricular size is normal.   3. The mitral valve is grossly normal. Trivial mitral valve  regurgitation.   4. The aortic valve is grossly  normal. Aortic valve regurgitation is not  visualized. No aortic stenosis is present.     ROS - per HPI   OV 10/31/2020  Subjective:  Patient ID: Catherine  CHRISTELLA Parks, female , DOB: 1941-07-31 , age 4 y.o. , MRN: [id number] , ADDRESS: [address] [place] [address] PCP Catherine Fallow, MD Patient Care Team: Catherine Fallow, MD as PCP - General (Internal Medicine) Octavia Bruckner, MD as Consulting Physician  (Optometry) Croitoru, Jerel, MD as Consulting Physician (Cardiology) Jakie Alm SAUNDERS, MD as Consulting Physician (Gastroenterology) Rodgers Drone, MD (Gynecology) Ivin Kocher, MD as Consulting Physician (Dermatology) Timmy Maude SAUNDERS, MD as Consulting Physician (Oncology) Ethyl Bruckner BRAVO, MD as Consulting Physician (Otolaryngology)  This Provider for this visit: Treatment Team:  Attending Provider: Geronimo Amel, MD    10/31/2020 -   Chief Complaint  Patient presents with   Follow-up    Doing well    Follow-up combination emphysema and ILD with pulmonary nodule.  - indeterminate UIP; Last CT Aug 2021  - HP panel neg aug 2021  -ANA positive and double-stranded DNA +181 in November 2017 ANA positive and double-stranded DNA positive 181 in 2017 Positive IGE   - 1652 , and positive enviromental allergy  test RAST - Nov 2017  - elevated 1411 - in aug 2021  - 2021 - sees Dr Altamease  Piror > 50 pack smoker   HPI Kenyah  CHRISTELLA Parks 83 y.o. -presents for follow-up.  Last seen in summer 2021.  At the time her ILD was stable.  Etiology of the indeterminate pattern of ILD was not known.  I ordered a hypersensitive pneumonitis panel was negative.  Her IgE was elevated.  She has seen Dr. Altamease in allergy  according to history.  I do not know the details of this but apparently she is on as needed tablets.  At this point in time her dyspnea is improved.  Her walking desaturation test is stable.  Her pulmonary function test showed decline in FVC with stability/improvement in DLCO.  Overall she is stable.  She has not attended pulmonary rehabilitation a few years.  She found it beneficial in the past but she does not want to go again because she is feeling better and she plans to work out with her friends.  There was concern of low ejection fraction and I sent her to cardiology Dr. Okey.  Apparently repeat images were done and I confirmed this on the note.  The EF is actually  normal.  Overall patient is satisfied with the quality of life.  She is not interested in lung biopsy preemptive antifibrotic's.  She is interested in serial monitoring and supportive care at this point.  She will address antifibrotic's if she declines based on a risk-benefit analysis at that time.  For her associated emphysema last time I gave her Spiriva .  She tells me that it did not benefit her.  She is not interested in that or albuterol as needed.  No systemic manifestations of connective tissue disease so far    OV 09/20/2021  Subjective:  Patient ID: Catherine  CHRISTELLA Parks, female , DOB: [birth year] , age 58 y.o. , MRN: [id number] , ADDRESS: [address] [place] [address] PCP Catherine Fallow, MD Patient Care Team: Catherine Fallow, MD as PCP - General (Internal Medicine) Octavia Bruckner, MD as Consulting Physician (Optometry) Croitoru, Jerel, MD as Consulting Physician (Cardiology) Jakie Alm SAUNDERS, MD as Consulting Physician (Gastroenterology) Rodgers Drone, MD (Gynecology) Ivin Kocher, MD as Consulting Physician (Dermatology) Timmy Maude  JONELLE, MD as Consulting Physician (Oncology) Ethyl Lonni BRAVO, MD (Inactive) as Consulting Physician (Otolaryngology)  This Provider for this visit: Treatment Team:  Attending Provider: Geronimo Amel, MD    09/20/2021 -   Chief Complaint  Patient presents with   Follow-up    PFT performed today.  Pt states she is about the same since last visit.     HPI Baneen  MARSHAL SCHRECENGOST 83 y.o. -returns for follow-up.  Not seen her in a year.  Most recent CT scan November 2022 shows probable UIP.  Symptoms and pulmonary function tests are stable.  There are no new issues other than the fact she updated me about her GI issues.  At this point in time we discussed about continued supportive care.  She is in favor of this.  We discussed antifibrotic's again.  I went over the antifibrotic's and the side effect profile and the benefit  profile.  She is again hesitant towards this.  Discussed the third option about clinical trials with an infusion study against connective tissue growth factor and the potential side effect profile of that.  She is interested in this.  We gave her the consent form.  But later the research coordinator indicated that because her FVC is over 95% she does not qualify for this trial.  Research coronary will inform the patient about this.  We will discuss in a case conference if this is IPF   FEB 2023 MDD  Interstitial Lung Disease Multidisciplinary Conference   Rebbeca  NIYONNA BETSILL    MRN [id number]    DOB 1941-07-29  Primary Care Physician:McKeown, Elsie, MD  Referring Physician: CHRISTELLA Catherine  Time of Conference: 7.30am- 8.30am Date of conference: 10/30/21 Location of Conference: -  Virtual  Participating Pulmonary: Dr. Amel Geronimo, MD - yes,  Dr Lonna Coder, MD - yes Pathology: Dr Katrine Muskrat, MD - no , ] Radiology: Dr Selinda Blue MD - no, Dr Toribio Aye MD - yes,  Others: Thom Chill and Spurgeon Craft  Brief History: Combination emphysema and ILD with pulmonary nodule. ANA positive and double-stranded DNA positive 181 in 2017. Positive IGE - 1652 , and positive enviromental allergy  test RAST - Nov 2017. Prior > 50 pack smoker. Asymptomatic mostly - very mild doe for stairs. Spirometry normal but DLCO reduced 43% - reflective of combined emphyhsema/fibrosis - PFT mild progression 2018-> 2023. Last HRCT - nov 2022 - described as prob UIP. -What is the MDD dx on radiology and clinical? - she does not want standard of care anti-fibrotic  Serology:   Latest Reference Range & Units 10/12/10 20:28 12/03/10 08:47 08/02/16 10:29  Anti Nuclear Antibody (ANA) Negative    Positive !  Angiotensin 1 CE 8 - 52 U/L  60 (H)   Cyclic Citrullin Peptide Ab Units   <16  dsDNA Ab 0 - 9 IU/mL   181 (H)  ENA RNP Ab 0.0 - 0.9 AI   <0.2  ENA SSA (RO) Ab 0.0 - 0.9 AI   <0.2  ENA SSB (LA) Ab 0.0 - 0.9 AI    <0.2  Tissue Transglutaminase Ab, IgA <20 units 4.2 U/ML    ENA SM Ab Ser-aCnc 0.0 - 0.9 AI   <0.2  !: Data is abnormal (H): Data is abnormally high  MDD discussion of CT scan    - Date or time period of scan:  HRCT: 07/24/2021 HRCT: 04/24/2020 HRCT: 08/07/2016   - Features mentioned:  - CT - has emphysema but in terims of ILD has  subpleural bronchiectactis and some HC in RLL Agree with prob UIP. Very slow progression on CT over long period time  - What is the final conclusion per 2018 ATS/Fleischner Criteria - PRobable UIP with very slow progression over time  Pathology discussion of biopsy no:   MDD Impression/Recs: IPF. She has positive DS-DNA but the radiologic pattern is not c/w IPAF. Next visit recheck ANA pattern and DS=-DNA   OV 11/26/2023  Subjective:  Patient ID: Catherine  CHRISTELLA Parks, female , DOB: [birth year] , age 56 y.o. , MRN: [id number] , ADDRESS: [address] [place] [address] PCP Catherine Fallow, MD Patient Care Team: Catherine Fallow, MD as PCP - General (Internal Medicine) Octavia Bruckner, MD as Consulting Physician (Optometry) Croitoru, Jerel, MD as Consulting Physician (Cardiology) Rodgers Drone, MD (Gynecology) Timmy Maude SAUNDERS, MD as Consulting Physician (Oncology) Catherine Fallow, MD as Referring Physician (Internal Medicine) Catherine Amel, MD as Consulting Physician (Pulmonary Disease)  This Provider for this visit: Treatment Team:  Attending Provider: Geronimo Amel, MD 11/26/2023 -   Chief Complaint  Patient presents with   Follow-up    Breathing is overall doing well. She has occ chest heaviness.      HPI Catherine  ANNALYN Parks 83 y.o. -presents for ILD.  Last seen just over 2 years ago.  She says that overall she is been doing well.  No respiratory issues no change in her respiratory status.  She does find that it is more difficult to do household work this because of a combination of fatigue which she believes is because of  shortness of breath and also aging.  Nevertheless there is no change in overall health status.  Denies any Raynaud's.  Denies any change in her baseline allergies.  No cough.  Exercise hypoxemia test here today is normal.  Her last PFT and CT scan was 2 years ago.  Her primary care physician suddenly passed away.  She is willing to get restage.  At the multidisciplinary case conference 2 years ago it was decided that we would recheck her ANA and double-stranded DNA.  She had this information with and she willing to do that.      OV 05/06/2024  Subjective:  Patient ID: Catherine  CHRISTELLA Parks, female , DOB: 29-Aug-1941 , age 84 y.o. , MRN: [id number] , ADDRESS: [address] [place] [address] PCP Jude Harden GAILS, MD Patient Care Team: Jude Harden GAILS, MD as PCP - General (Pulmonary Disease) Octavia Bruckner, MD as Consulting Physician (Optometry) Croitoru, Jerel, MD as Consulting Physician (Cardiology) Rodgers Drone, MD (Gynecology) Timmy Maude SAUNDERS, MD as Consulting Physician (Oncology) Catherine Fallow, MD as Referring Physician (Internal Medicine) Catherine Amel, MD as Consulting Physician (Pulmonary Disease)  This Provider for this visit: Treatment Team:  Attending Provider: Geronimo Amel, MD    Follow-up combination emphysema and ILD with pulmonary nodule.  - indeterminate UIP; Last CT Nov 2022  - HP panel neg aug 2021  -ANA positive and double-stranded DNA +181 in November 2017  -<NEGATIVE in March 2024  = ACE positive 2012 at 60  - IPF DIAGNSOS FEB 2023 MDD   Positive IGE with psiti RAST Test 2017- sees DR Fleeta Smock sinc 2017   - 1652 , and positive enviromental allergy  test RAST - Nov 2017  - elevated 1411 - in aug 2021  - 2021 - sees Dr Altamease  Piror > 50 pack smoker  Chronic systolic dysfunction sees Dr. Vina Ross-last visit October 2021  Hypercalcemia  Chronic abdominal pain sees Dr.  Suzen beavers  -Colonoscopy October 2022 with  pancolonic diverticulosis and EGD is mild sized hiatal hernia, gastritis and duodenitis biopsies negative  Hypertension  Nonspecific arthritis    05/06/2024 -   Chief Complaint  Patient presents with   Medical Management of Chronic Issues   Interstitial Lung Disease    Breathing is overall doing well. She has occ dry cough.      HPI Catherine  LENISHA LACAP 83 y.o. -returns for follow-up.  Last visit was in March 2025 as a routine follow-up.  Since her last visit she has been given a diagnosis of left shoulder arthritis not other specified by her primary care physician.  She has new primary care physician Dr. Chandra.  Otherwise Interim Health status: No new complaints No new medical problems. No new surgeries. No ER visits. No Urgent care visits. No changes to medications  She did not have CT scan of the chest because of scheduling issues.  Apparently someone in the CT scan scheduling suite dropped her call and then never called back.  She did have pulmonary function test FVC stable but her DLCO seems to be declining over time.  Nevertheless symptom scores are stable exercise desaturation test is also stable/slightly worse.    She is agreed to get a follow-up CT scan of the chest and echocardiogram.->  This is for pulmonary fibrosis worsening and pulmonary hypertension development monitoring.      SYMPTOM SCALE - ILD 01/19/2020  05/01/2020  10/31/2020  09/20/2021  05/06/2024   O2 use ra ra ra ra ra  Shortness of Breath 0 -> 5 scale with 5 being worst (score 6 If unable to do)      At rest 0 0 1 0 0  Simple tasks - showers, clothes change, eating, shaving 0 1 1 0 1  Household (dishes, doing bed, laundry) 1 3 2 1 2   Shopping 1 3 1 1 2   Walking level at own pace 1 2 2 1 2   Walking up Stairs 2 3 2 2 2   Total (30-36) Dyspnea Score 5 12 9 5 9   How bad is your cough? 0 1 0 0 1  How bad is your fatigue 1 0 1 0 1  How bad is nausea 0 0 0 0 0  How bad is vomiting?  0 0 0 0 0  How bad is  diarrhea? 0 0 0 0 0  How bad is anxiety? 0 2 0 0 0  How bad is depression 0 1 0 0 0   Simple office walk 185 feet x  3 laps goal with forehead probe 01/19/2020  10/31/2020  09/20/2021  11/26/2023  05/06/2024   O2 used ra ra ra ra ra  Number laps completed 3 3 3  Sit and stand x 15 toes- tookd Sit stand x 15  Comments about pace x avg pacd avg  45 sec  Resting Pulse Ox/HR 100% and x/min 100% and70 100% and 70 100% and HR 72 99% adhr 81  Final Pulse Ox/HR 98% and 98/min 99% and 88/mn 99% and 119 99% and HR 100 96% and HR 115  Desaturated </= 88% no no no    Desaturated <= 3% points no no no    Got Tachycardic >/= 90/min yes no yes    Symptoms at end of test x No dyspnea No complaints No complaints   Miscellaneous comments x  ? More tacht Took 58 sec Faster but dropped 3 ponts       PFT  Latest Ref Rng & Units 05/06/2024    2:29 PM 09/20/2021    9:52 AM 08/31/2020    8:53 AM 04/20/2020   10:47 AM 02/26/2019    8:48 AM 06/13/2017    1:24 PM 12/27/2016    8:50 AM  PFT Results  FVC-Pre L 2.55  P 2.65  2.64  2.78  2.75  2.89  2.81   FVC-Predicted Pre % 93  P 117  115  121  118  121  117   FVC-Post L     2.75     FVC-Predicted Post %     118     Pre FEV1/FVC % % 61  P 62  65  64  64  66  66   Post FEV1/FCV % %     65     FEV1-Pre L 1.57  P 1.65  1.72  1.78  1.77  1.91  1.85   FEV1-Predicted Pre % 77  P 94  97  100  98  103  99   FEV1-Post L     1.79     DLCO uncorrected ml/min/mmHg 9.86  P 10.04  13.53  10.81  11.55  12.85  10.95   DLCO UNC% % 49  P 49  67  53  57  47  40   DLCO corrected ml/min/mmHg  10.01  13.53  10.92  11.66  12.56    DLCO COR %Predicted %  49  67  54  57  46    DLVA Predicted % 58  P 59  84  59  68  51  48   TLC L     5.10     TLC % Predicted %     95     RV % Predicted %     99       P Preliminary result       LAB RESULTS last 96 hours No results found.       has a past medical history of Allergic rhinitis, Arthritis, Bronchiectasis (HCC), Colon  polyps, Diverticulosis, Emphysema of lung (HCC), GERD (gastroesophageal reflux disease), HSV-1 (herpes simplex virus 1) infection, Hyperparathyroidism (HCC), Hypertension, and Pulmonary nodule (07/10/2017).   reports that she quit smoking about 9 years ago. Her smoking use included cigarettes. She started smoking about 67 years ago. She has a 58 pack-year smoking history. She has never used smokeless tobacco.  Past Surgical History:  Procedure Laterality Date   ABDOMINAL HYSTERECTOMY     ovaries spared   CESAREAN SECTION  1969   COLONOSCOPY     DIRECT LARYNGOSCOPY N/A 10/21/2014   Procedure: DIRECT LARYNGOSCOPY;  Surgeon: Lonni FORBES Angle, MD;  Location: [facility];  Service: ENT;  Laterality: N/A;   EXCISION NASAL MASS Right 10/21/2014   Procedure: EXCISION NASOPHARYNGEAL MASS;  Surgeon: Lonni FORBES Angle, MD;  Location: [facility];  Service: ENT;  Laterality: Right;   EYE SURGERY     cataracts   MASS EXCISION Right 10/21/2014   Procedure: RIGHT NECK NODE BIOPSY;  Surgeon: Lonni FORBES Angle, MD;  Location: [facility];  Service: ENT;  Laterality: Right;    Allergies  Allergen Reactions   Ace Inhibitors Cough   Lemon Oil Hives    Reaction to lemons   Natural Vegetable Orange [Psyllium]     oranges   Shellfish Allergy  Swelling and Hives    Immunization History  Administered Date(s) Administered   DTaP 07/30/2011  Fluad Quad(high Dose 65+) 08/25/2020   Influenza, High Dose Seasonal PF 06/07/2016, 06/25/2017, 09/22/2018, 07/19/2019, 07/25/2021, 07/17/2023   Influenza-Unspecified 08/02/2022   Moderna Covid-19 Fall Seasonal Vaccine 10yrs & older 09/04/2022   PFIZER(Purple Top)SARS-COV-2 Vaccination 10/05/2019, 10/25/2019, 08/25/2020   Pfizer Covid-19 Vaccine Bivalent Booster 39yrs & up 07/21/2021   Pneumococcal Conjugate-13 11/22/2015   Pneumococcal Polysaccharide-23 05/11/2013   Pneumococcal-Unspecified 01/07/2002   Respiratory  Syncytial Virus Vaccine,Recomb Aduvanted(Arexvy) 09/04/2022   Td 01/07/2002, 05/07/2012   Unspecified SARS-COV-2 Vaccination 07/29/2023    Family History  Problem Relation Age of Onset   Hypertension Mother    Stroke Mother    Heart attack Father    Breast cancer Sister        17s   Lung cancer Sister 42   Breast cancer Sister    Cancer Brother    Prostate cancer Brother    Other Brother        Heart Issues   Cancer Brother    Breast cancer Maternal Grandmother    Lung disease Neg Hx    Rheumatologic disease Neg Hx    Colon cancer Neg Hx    Esophageal cancer Neg Hx    Pancreatic cancer Neg Hx    Liver cancer Neg Hx      Current Outpatient Medications:    acetaminophen  (TYLENOL ) 500 MG tablet, Take 500 mg by mouth every 6 (six) hours as needed (pain)., Disp: , Rfl:    amLODipine  (NORVASC ) 10 MG tablet, TAKE 1 TABLET BY MOUTH EVERY DAY FOR BLOOD PRESSURE, Disp: 90 tablet, Rfl: 3   Ascorbic Acid (VITAMIN C) 100 MG tablet, Take 100 mg by mouth daily., Disp: , Rfl:    Blood Glucose Monitoring Suppl DEVI, 1 each by Does not apply route daily. May substitute to any manufacturer covered by patient's insurance., Disp: 1 each, Rfl: 0   famotidine  (PEPCID ) 40 MG tablet, Take 1 tablet (40 mg total) by mouth daily., Disp: 90 tablet, Rfl: 3   halobetasol  (ULTRAVATE ) 0.05 % cream, Apply topically 2 (two) times daily. Apply to Eczema Rash 2 x/day, Disp: 100 g, Rfl: 11   hydrocortisone 2.5 % cream, , Disp: , Rfl:    Magnesium  250 MG TABS, Take 1 tablet (250 mg total) by mouth daily., Disp: 30 tablet, Rfl: 0   Naphazoline HCl (CLEAR EYES OP), Place 1 drop into both eyes daily., Disp: , Rfl:    OVER THE COUNTER MEDICATION, Takes OTC eczema cream PRN., Disp: , Rfl:    pantoprazole  (PROTONIX ) 40 MG tablet, Take 1 tablet (40 mg total) by mouth daily., Disp: 90 tablet, Rfl: 3   rosuvastatin  (CRESTOR ) 5 MG tablet, TAKE 1 TABLET IN THE EVENING 5 DAYS A WEEK FOR CHOLESTEROL TO REDUCE HEART  ATTACK/STROKE RISK, Disp: 60 tablet, Rfl: 1   triamterene -hydrochlorothiazide (MAXZIDE-25) 37.5-25 MG tablet, TAKE 1 TABLET DAILY FOR BP & FLUID, Disp: 90 tablet, Rfl: 3   nitroGLYCERIN  (NITROSTAT ) 0.4 MG SL tablet, Place 0.4 mg under the tongue every 5 (five) minutes as needed for chest pain. (Patient not taking: Reported on 05/06/2024), Disp: , Rfl:    Wheat Dextrin (BENEFIBER PO), Take 1 Dose by mouth daily., Disp: , Rfl:       Objective:   Vitals:   05/06/24 1529  BP: 124/80  Pulse: 83  SpO2: 100%  Weight: 143 lb (64.9 kg)  Height: 5' 2 (1.575 m)    Estimated body mass index is 26.16 kg/m as calculated from the following:   Height as of  this encounter: 5' 2 (1.575 m).   Weight as of this encounter: 143 lb (64.9 kg).  @WEIGHTCHANGE @  Filed Weights   05/06/24 1529  Weight: 143 lb (64.9 kg)     Physical Exam   General: No distress. Looks well O2 at rest: no Cane present: no Sitting in wheel chair: no Frail: no Obese: no Neuro: Alert and Oriented x 3. GCS 15. Speech normal Psych: Pleasant Resp:  Barrel Chest - no.  Wheeze - no, Crackles - yes, No overt respiratory distress CVS: Normal heart sounds. Murmurs - no Ext: Stigmata of Connective Tissue Disease - no HEENT: Normal upper airway. PEERL +. No post nasal drip        Assessment/     Assessment & Plan ILD (interstitial lung disease) (HCC)  IPF (idiopathic pulmonary fibrosis) (HCC)    PLAN Patient Instructions  ILD (interstitial lung disease) (HCC) - IPF Prior ANA and DS DNA positive 2017 at low level, later negative March 2025  - clinically stable since last visit 2023 and march 2025 in terms of symptooms, exercise test and FVC part of breathing test - but dlco test suggests fibrosis might be worsening   Plan - based on shared decision making - will hold off on anti-fibrotics - but we need to continue monitoring  -  - do HRCT in 4 months  - chjange upcoming CT to 4 months - do echo in 4  month  Pulmonary emphysema, unspecified emphysema type (HCC)  -Too bad Spiriva  and albuterol have not helped you  Plan -Expectant follow-up   Solitary Pulmonary Nodule - stable 2018 -> 2021 august. Not reported Jan 2023  Plan  - no further followup   Elevated IgE level in 2017  Plan  -Per Dr. Altamease allergist  Followup  -4 months on a 15-minute slot - symptoms socre and simple walkng desaturation test at followup  - but after CT and echo    FOLLOWUP    Return in about 4 months (around 09/05/2024) for with Dr Catherine, after ECHO, after HRCT chest.    SIGNATURE    Dr. Dorethia Catherine, M.D., F.C.C.P,  Pulmonary and Critical Care Medicine Staff Physician, [facility] Director - Interstitial Lung Disease  Program  [facility] at [facility] [place], [place], [address]  Pager: [phone], If no answer or between  15:00h - 7:00h: call [phone] Telephone: [phone]  4:30 PM 05/06/2024

## 2024-05-06 NOTE — Patient Instructions (Signed)
Spiro/DLCO performed today. 

## 2024-05-13 ENCOUNTER — Other Ambulatory Visit: Payer: Self-pay | Admitting: Family Medicine

## 2024-05-21 DIAGNOSIS — Z1231 Encounter for screening mammogram for malignant neoplasm of breast: Secondary | ICD-10-CM | POA: Diagnosis not present

## 2024-05-21 LAB — HM MAMMOGRAPHY

## 2024-05-24 ENCOUNTER — Encounter: Payer: Self-pay | Admitting: Family Medicine

## 2024-06-04 ENCOUNTER — Ambulatory Visit (INDEPENDENT_AMBULATORY_CARE_PROVIDER_SITE_OTHER): Admitting: Family Medicine

## 2024-06-04 ENCOUNTER — Encounter: Payer: Self-pay | Admitting: Family Medicine

## 2024-06-04 VITALS — BP 136/81 | HR 92 | Ht 65.0 in | Wt 143.1 lb

## 2024-06-04 DIAGNOSIS — Z23 Encounter for immunization: Secondary | ICD-10-CM | POA: Diagnosis not present

## 2024-06-04 DIAGNOSIS — Z0000 Encounter for general adult medical examination without abnormal findings: Secondary | ICD-10-CM | POA: Diagnosis not present

## 2024-06-04 DIAGNOSIS — J849 Interstitial pulmonary disease, unspecified: Secondary | ICD-10-CM

## 2024-06-04 DIAGNOSIS — I10 Essential (primary) hypertension: Secondary | ICD-10-CM | POA: Diagnosis not present

## 2024-06-04 DIAGNOSIS — G4700 Insomnia, unspecified: Secondary | ICD-10-CM | POA: Insufficient documentation

## 2024-06-04 DIAGNOSIS — E673 Hypervitaminosis D: Secondary | ICD-10-CM

## 2024-06-04 DIAGNOSIS — E782 Mixed hyperlipidemia: Secondary | ICD-10-CM | POA: Diagnosis not present

## 2024-06-04 DIAGNOSIS — H6992 Unspecified Eustachian tube disorder, left ear: Secondary | ICD-10-CM | POA: Insufficient documentation

## 2024-06-04 DIAGNOSIS — N182 Chronic kidney disease, stage 2 (mild): Secondary | ICD-10-CM | POA: Diagnosis not present

## 2024-06-04 NOTE — Assessment & Plan Note (Signed)
-   Discussed vaccinations. - Recommended annual influenza vaccine.  Pt will get flu vaccine today - Recommended updated COVID-19 vaccine. Pt will get this at the pharmacy - Deferred discussion on shingles vaccine. - Labs ordered today for routine monitoring, including cholesterol.

## 2024-06-04 NOTE — Assessment & Plan Note (Signed)
-   Reports waking between 2-4 AM after going to sleep at 10-11 PM. Does not feel tired during the day. Has tried OTC sleep aids with some effect. Denies snoring. Counseled that sleep duration decreases with age and 6-7 hours is typical. - Continue sleep hygiene measures. - Advised to avoid screen time (TV, phone) after waking at night to improve chances of returning to sleep. Suggested using a white noise machine or ambient noise app without a screen for background sound. - May consider trial of low-dose doxepin if symptoms persist or worsen.

## 2024-06-04 NOTE — Assessment & Plan Note (Addendum)
 Advised pt to f/u with pulm office regarding scheduling of CT scan and echocardiogram

## 2024-06-04 NOTE — Progress Notes (Signed)
 Annual physical  Subjective    Patient ID: Catherine  MODESTINE Parks, female    DOB: [birth year]  Age: 83 y.o. MRN: [id number]  Chief Complaint  Patient presents with   Annual Exam   HPI Catherine Parks  is a 83 y.o. old female here  for annual exam.   Subjective - Insomnia, specifically early morning awakening. Reports no difficulty with sleep onset. Goes to bed between 10:00 PM and 11:00 PM. Wakes up between 2:00 AM and 4:00 AM, more than once a week. Usually stays in bed until 6:30 AM or 7:00 AM. Sometimes drifts back to sleep around 5:30 AM but wakes again shortly after. Does not feel tired during the day and does not nap. Typically sleeps with the TV on. Believes lack of physical activity may contribute. Reports history of periods of insomnia in younger years. - Boredom/low mood. Reports feeling bored, which may be contributing to feelings of depression. Reading less than previously. Actively involved in choir, women's group, Bible study, and church breakfast. - Ear pressure. Reports intermittent pressure sensation in the ear, similar to being in the mountains. No hearing difficulty. - Follow-up with pulmonology. Saw pulmonologist last month. CT scan and echocardiogram were ordered but have not been scheduled yet. Reports difficulty with the specialist's scheduling.  Medications: Takes an over-the-counter sleep aid occasionally, taking half a pill to avoid daytime drowsiness. Has tried melatonin in the past without benefit.  PMH, PSH, FH, Social Hx: PMHx: History of COVID-19 last year. Darden is currently living with them.  ROS: Denies snoring. Denies hearing loss.   The ASCVD Risk score (Arnett DK, et al., 2019) failed to calculate for the following reasons:   The 2019 ASCVD risk score is only valid for ages 83 to 83  Health Maintenance Due  Topic Date Due   Zoster Vaccines- Shingrix (1 of 2) Never done   DTaP/Tdap/Td (4 - Tdap) 05/07/2022   Medicare Annual Wellness (AWV)  04/08/2024    COVID-19 Vaccine (7 - Pfizer risk 2024-25 season) 05/17/2024      Objective:     BP 136/81   Pulse 92   Ht 5' 5 (1.651 m)   Wt 143 lb 1.9 oz (64.9 kg)   SpO2 99%   BMI 23.82 kg/m    Physical Exam General: Alert, oriented HEENT: PERRLA, EOMI, moist mucous membrane.  Normal tympanic membrane bilaterally CV: Rate rhythm no murmurs Pulmonary: Mild expiratory crackles of the left lower lung. GI: Soft, nontender Extremities: No pedal edema Psych: Pleasant affect.   No results found for any visits on 06/04/24.      Assessment & Plan:   Physical exam, annual  Essential hypertension -     Comprehensive metabolic panel with GFR -     TSH  ILD (interstitial lung disease) (HCC) Assessment & Plan: Advised pt to f/u with pulm office regarding scheduling of CT scan and echocardiogram    CKD (chronic kidney disease) stage 2, GFR 60-89 ml/min -     Comprehensive metabolic panel with GFR  Hypervitaminosis D -     VITAMIN D  25 Hydroxy (Vit-D Deficiency, Fractures)  Hyperlipidemia, mixed -     Lipid panel -     TSH  Early awakening Assessment & Plan: - Reports waking between 2-4 AM after going to sleep at 10-11 PM. Does not feel tired during the day. Has tried OTC sleep aids with some effect. Denies snoring. Counseled that sleep duration decreases with age and 6-7 hours is typical. - Continue sleep hygiene measures. -  Advised to avoid screen time (TV, phone) after waking at night to improve chances of returning to sleep. Suggested using a white noise machine or ambient noise app without a screen for background sound. - May consider trial of low-dose doxepin if symptoms persist or worsen.   Healthcare maintenance Assessment & Plan: - Discussed vaccinations. - Recommended annual influenza vaccine.  Pt will get flu vaccine today - Recommended updated COVID-19 vaccine. Pt will get this at the pharmacy - Deferred discussion on shingles vaccine. - Labs ordered today for  routine monitoring, including cholesterol.   Eustachian tube dysfunction, left Assessment & Plan: - Reports intermittent ear pressure sensation. Exam is normal. - Recommended trial of Flonase nasal spray once daily to alleviate pressure.      Return in about 6 months (around 12/02/2024) for HTN.    Toribio MARLA Slain, MD

## 2024-06-04 NOTE — Assessment & Plan Note (Signed)
-   Reports intermittent ear pressure sensation. Exam is normal. - Recommended trial of Flonase nasal spray once daily to alleviate pressure.

## 2024-06-04 NOTE — Patient Instructions (Signed)
 It was nice to see you today,  We addressed the following topics today: -For your sleep issues, I would recommend that when you wake up early you do not turn on the TV phone or other screen.  The light can affect your sleep cycle.  You can try using ambient noise or white noise apps from your phone. - We will get some lab today and I will let you know the results when we get them - If you have not heard from somebody regarding your CT scan and echocardiogram appointments in the next 2 weeks you should call pulmonologist office to let them know.  Have a great day,  Rolan Slain, MD

## 2024-06-05 LAB — SPECIMEN STATUS

## 2024-06-07 ENCOUNTER — Ambulatory Visit: Payer: Self-pay | Admitting: Family Medicine

## 2024-07-16 ENCOUNTER — Ambulatory Visit: Payer: Medicare PPO | Admitting: Internal Medicine

## 2024-07-27 ENCOUNTER — Encounter (HOSPITAL_BASED_OUTPATIENT_CLINIC_OR_DEPARTMENT_OTHER): Payer: Self-pay

## 2024-07-28 ENCOUNTER — Ambulatory Visit (INDEPENDENT_AMBULATORY_CARE_PROVIDER_SITE_OTHER)

## 2024-07-28 DIAGNOSIS — I361 Nonrheumatic tricuspid (valve) insufficiency: Secondary | ICD-10-CM | POA: Diagnosis not present

## 2024-07-28 DIAGNOSIS — J849 Interstitial pulmonary disease, unspecified: Secondary | ICD-10-CM

## 2024-07-28 DIAGNOSIS — J84112 Idiopathic pulmonary fibrosis: Secondary | ICD-10-CM | POA: Diagnosis not present

## 2024-07-28 LAB — ECHOCARDIOGRAM COMPLETE
Area-P 1/2: 2.77 cm2
S' Lateral: 2.84 cm

## 2024-07-29 ENCOUNTER — Ambulatory Visit

## 2024-07-29 DIAGNOSIS — Z0000 Encounter for general adult medical examination without abnormal findings: Secondary | ICD-10-CM | POA: Diagnosis not present

## 2024-07-29 NOTE — Patient Instructions (Signed)
 Ms. Koch,  Thank you for taking the time for your Medicare Wellness Visit. I appreciate your continued commitment to your health goals. Please review the care plan we discussed, and feel free to reach out if I can assist you further.  Please note that Annual Wellness Visits do not include a physical exam. Some assessments may be limited, especially if the visit was conducted virtually. If needed, we may recommend an in-person follow-up with your provider.  Ongoing Care Seeing your primary care provider every 3 to 6 months helps us  monitor your health and provide consistent, personalized care.   Referrals If a referral was made during today's visit and you haven't received any updates within two weeks, please contact the referred provider directly to check on the status.  Recommended Screenings:  Health Maintenance  Topic Date Due   Zoster (Shingles) Vaccine (1 of 2) Never done   DTaP/Tdap/Td vaccine (4 - Tdap) 05/07/2022   Medicare Annual Wellness Visit  04/08/2024   COVID-19 Vaccine (7 - 2025-26 season) 05/17/2024   Breast Cancer Screening  05/21/2025   Pneumococcal Vaccine for age over 49  Completed   Flu Shot  Completed   DEXA scan (bone density measurement)  Completed   Meningitis B Vaccine  Aged Out   Hepatitis C Screening  Discontinued       07/29/2024    3:40 PM  Advanced Directives  Does Patient Have a Medical Advance Directive? Yes  Type of Advance Directive Healthcare Power of Attorney  Copy of Healthcare Power of Attorney in Chart? No - copy requested    Vision: Annual vision screenings are recommended for early detection of glaucoma, cataracts, and diabetic retinopathy. These exams can also reveal signs of chronic conditions such as diabetes and high blood pressure.  Dental: Annual dental screenings help detect early signs of oral cancer, gum disease, and other conditions linked to overall health, including heart disease and diabetes.  Please see the attached  documents for additional preventive care recommendations.

## 2024-07-29 NOTE — Progress Notes (Signed)
 Chief Complaint  Patient presents with   Medicare Wellness     Subjective:   Catherine  CHRISTELLA Parks is a 83 y.o. female who presents for a Medicare Annual Wellness Visit.  Allergies (verified) Ace inhibitors, Lemon oil, Natural vegetable orange [psyllium], and Shellfish allergy    History: Past Medical History:  Diagnosis Date   Allergic rhinitis    Arthritis    Bronchiectasis (HCC)    Colon polyps    Diverticulosis    Emphysema of lung (HCC)    GERD (gastroesophageal reflux disease)    HSV-1 (herpes simplex virus 1) infection    Hyperparathyroidism    Hypertension    Pulmonary nodule 07/10/2017   CT 01/2017, CT 07/2017 - stable on follow up, no further follow up recommended.   Past Surgical History:  Procedure Laterality Date   ABDOMINAL HYSTERECTOMY     ovaries spared   CESAREAN SECTION  1969   COLONOSCOPY     DIRECT LARYNGOSCOPY N/A 10/21/2014   Procedure: DIRECT LARYNGOSCOPY;  Surgeon: Lonni FORBES Angle, MD;  Location: [facility];  Service: ENT;  Laterality: N/A;   EXCISION NASAL MASS Right 10/21/2014   Procedure: EXCISION NASOPHARYNGEAL MASS;  Surgeon: Lonni FORBES Angle, MD;  Location: [facility];  Service: ENT;  Laterality: Right;   EYE SURGERY     cataracts   MASS EXCISION Right 10/21/2014   Procedure: RIGHT NECK NODE BIOPSY;  Surgeon: Lonni FORBES Angle, MD;  Location: [facility];  Service: ENT;  Laterality: Right;   Family History  Problem Relation Age of Onset   Hypertension Mother    Stroke Mother    Heart attack Father    Breast cancer Sister        52s   Lung cancer Sister 2   Breast cancer Sister    Cancer Brother    Prostate cancer Brother    Other Brother        Heart Issues   Cancer Brother    Breast cancer Maternal Grandmother    Lung disease Neg Hx    Rheumatologic disease Neg Hx    Colon cancer Neg Hx    Esophageal cancer Neg Hx    Pancreatic cancer Neg Hx    Liver cancer Neg Hx    Social  History   Occupational History   Not on file  Tobacco Use   Smoking status: Former    Current packs/day: 0.00    Average packs/day: 1 pack/day for 58.0 years (58.0 ttl pk-yrs)    Types: Cigarettes    Start date: 10/26/1956    Quit date: 09/23/2014    Years since quitting: 9.8   Smokeless tobacco: Never  Vaping Use   Vaping status: Never Used  Substance and Sexual Activity   Alcohol use: No    Alcohol/week: 0.0 standard drinks of alcohol   Drug use: No   Sexual activity: Not Currently    Partners: Male    Birth control/protection: Post-menopausal   Tobacco Counseling Counseling given: Not Answered  SDOH Screenings   Food Insecurity: No Food Insecurity (07/29/2024)  Housing: Unknown (07/29/2024)  Transportation Needs: No Transportation Needs (07/29/2024)  Utilities: Not At Risk (07/29/2024)  Alcohol Screen: Low Risk  (07/29/2024)  Depression (PHQ2-9): Low Risk  (07/29/2024)  Financial Resource Strain: Low Risk  (07/29/2024)  Physical Activity: Inactive (07/29/2024)  Social Connections: Moderately Integrated (07/29/2024)  Stress: No Stress Concern Present (07/29/2024)  Recent Concern: Stress - Stress Concern Present (06/03/2024)  Tobacco Use: Medium  Risk (07/29/2024)  Health Literacy: Adequate Health Literacy (07/29/2024)   See flowsheets for full screening details  Depression Screen PHQ 2 & 9 Depression Scale- Over the past 2 weeks, how often have you been bothered by any of the following problems? Little interest or pleasure in doing things: 0 Feeling down, depressed, or hopeless (PHQ Adolescent also includes...irritable): 0 PHQ-2 Total Score: 0 Trouble falling or staying asleep, or sleeping too much: 3 Feeling tired or having little energy: 1 Poor appetite or overeating (PHQ Adolescent also includes...weight loss): 0 Feeling bad about yourself - or that you are a failure or have let yourself or your family down: 0 Trouble concentrating on things, such as reading the  newspaper or watching television (PHQ Adolescent also includes...like school work): 0 Moving or speaking so slowly that other people could have noticed. Or the opposite - being so fidgety or restless that you have been moving around a lot more than usual: 0 Thoughts that you would be better off dead, or of hurting yourself in some way: 0 PHQ-9 Total Score: 4 If you checked off any problems, how difficult have these problems made it for you to do your work, take care of things at home, or get along with other people?: Not difficult at all     Goals Addressed             This Visit's Progress    Patient Stated       07/29/2024, denies goals       Visit info / Clinical Intake: Medicare Wellness Visit Type:: Subsequent Annual Wellness Visit Persons participating in visit:: patient Medicare Wellness Visit Mode:: Telephone If telephone:: video declined Because this visit was a virtual/telehealth visit:: unable to obtan vitals due to lack of equipment If Telephone or Video please confirm:: I connected with the patient using audio enabled telemedicine application and verified that I am speaking with the correct person using two identifiers; I discussed the limitations of evaluation and management by telemedicine; The patient expressed understanding and agreed to proceed Patient Location:: home Provider Location:: home office Information given by:: patient Interpreter Needed?: No Pre-visit prep was completed: yes AWV questionnaire completed by patient prior to visit?: no Living arrangements:: (!) lives alone Patient's Overall Health Status Rating: good Typical amount of pain: some Does pain affect daily life?: no Are you currently prescribed opioids?: no  Dietary Habits and Nutritional Risks How many meals a day?: 2 Eats fruit and vegetables daily?: (!) no Most meals are obtained by: preparing own meals In the last 2 weeks, have you had any of the following?: none Diabetic::  no  Functional Status Activities of Daily Living (to include ambulation/medication): Independent Ambulation: Independent Medication Administration: Independent Home Management: Independent Manage your own finances?: yes Primary transportation is: driving Concerns about vision?: (!) yes (going to make appointment with eye doctor) Concerns about hearing?: no  Fall Screening Falls in the past year?: 0 Number of falls in past year: 0 Was there an injury with Fall?: 0 Fall Risk Category Calculator: 0 Patient Fall Risk Level: Low Fall Risk  Fall Risk Patient at Risk for Falls Due to: Medication side effect Fall risk Follow up: Falls prevention discussed; Falls evaluation completed  Home and Transportation Safety: All rugs have non-skid backing?: N/A, no rugs All stairs or steps have railings?: yes Grab bars in the bathtub or shower?: yes Have non-skid surface in bathtub or shower?: yes Good home lighting?: yes Regular seat belt use?: yes Hospital stays in the  last year:: no  Cognitive Assessment Difficulty concentrating, remembering, or making decisions? : no Will 6CIT or Mini Cog be Completed: yes What year is it?: 0 points What month is it?: 0 points Give patient an address phrase to remember (5 components): [address] About what time is it?: 0 points Count backwards from 20 to 1: 0 points Say the months of the year in reverse: 0 points Repeat the address phrase from earlier: 0 points 6 CIT Score: 0 points  Advance Directives (For Healthcare) Does Patient Have a Medical Advance Directive?: Yes Type of Advance Directive: Healthcare Power of Attorney Copy of Healthcare Power of Attorney in Chart?: No - copy requested  Reviewed/Updated  Reviewed/Updated: Reviewed All (Medical, Surgical, Family, Medications, Allergies, Care Teams, Patient Goals)        Objective:    Today's Vitals   There is no height or weight on file to calculate BMI.  Current  Medications (verified) Outpatient Encounter Medications as of 07/29/2024  Medication Sig   acetaminophen  (TYLENOL ) 500 MG tablet Take 500 mg by mouth every 6 (six) hours as needed (pain).   amLODipine  (NORVASC ) 10 MG tablet TAKE 1 TABLET BY MOUTH EVERY DAY FOR BLOOD PRESSURE   Ascorbic Acid (VITAMIN C) 100 MG tablet Take 100 mg by mouth daily.   Blood Glucose Monitoring Suppl DEVI 1 each by Does not apply route daily. May substitute to any manufacturer covered by patient's insurance.   famotidine  (PEPCID ) 40 MG tablet Take 1 tablet (40 mg total) by mouth daily.   halobetasol  (ULTRAVATE ) 0.05 % cream Apply topically 2 (two) times daily. Apply to Eczema Rash 2 x/day   hydrocortisone 2.5 % cream    Magnesium  250 MG TABS Take 1 tablet (250 mg total) by mouth daily.   Naphazoline HCl (CLEAR EYES OP) Place 1 drop into both eyes daily.   OVER THE COUNTER MEDICATION Takes OTC eczema cream PRN.   pantoprazole  (PROTONIX ) 40 MG tablet Take 1 tablet (40 mg total) by mouth daily.   rosuvastatin  (CRESTOR ) 5 MG tablet TAKE 1 TABLET IN THE EVENING 5 DAYS A WEEK FOR CHOLESTEROL TO REDUCE HEART ATTACK/STROKE RISK   triamterene -hydrochlorothiazide (MAXZIDE-25) 37.5-25 MG tablet TAKE 1 TABLET DAILY FOR BP & FLUID   Wheat Dextrin (BENEFIBER PO) Take 1 Dose by mouth daily.   nitroGLYCERIN  (NITROSTAT ) 0.4 MG SL tablet Place 0.4 mg under the tongue every 5 (five) minutes as needed for chest pain. (Patient not taking: Reported on 07/29/2024)   No facility-administered encounter medications on file as of 07/29/2024.   Hearing/Vision screen Hearing Screening - Comments:: Denies hearing issues Vision Screening - Comments:: Regular eye exams,  Immunizations and Health Maintenance Health Maintenance  Topic Date Due   Zoster Vaccines- Shingrix (1 of 2) Never done   DTaP/Tdap/Td (4 - Tdap) 05/07/2022   COVID-19 Vaccine (7 - 2025-26 season) 05/17/2024   Mammogram  05/21/2025   Medicare Annual Wellness (AWV)   07/29/2025   Pneumococcal Vaccine: 50+ Years  Completed   Influenza Vaccine  Completed   DEXA SCAN  Completed   Meningococcal B Vaccine  Aged Out   Hepatitis C Screening  Discontinued        Assessment/Plan:  This is a routine wellness examination for Mikisha .  Patient Care Team: Chandra Toribio POUR, MD as PCP - General (Family Medicine) Octavia Bruckner, MD as Consulting Physician (Optometry) Croitoru, Jerel, MD as Consulting Physician (Cardiology) Rodgers Drone, MD (Gynecology) Timmy Maude SAUNDERS, MD as Consulting Physician (Oncology) Buena,  Dorethia, MD as Consulting Physician (Pulmonary Disease)  I have personally reviewed and noted the following in the patient's chart:   Medical and social history Use of alcohol, tobacco or illicit drugs  Current medications and supplements including opioid prescriptions. Functional ability and status Nutritional status Physical activity Advanced directives List of other physicians Hospitalizations, surgeries, and ER visits in previous 12 months Vitals Screenings to include cognitive, depression, and falls Referrals and appointments  No orders of the defined types were placed in this encounter.  In addition, I have reviewed and discussed with patient certain preventive protocols, quality metrics, and best practice recommendations. A written personalized care plan for preventive services as well as general preventive health recommendations were provided to patient.   Ardella FORBES Dawn, LPN   [id number]/7974   Return in 1 year (on 07/29/2025).  After Visit Summary: (MyChart) Due to this being a telephonic visit, the after visit summary with patients personalized plan was offered to patient via MyChart   Nurse Notes: On the fence about covid vaccine. Due for TDAP and shingles vaccine.

## 2024-08-01 ENCOUNTER — Ambulatory Visit: Payer: Self-pay | Admitting: Internal Medicine

## 2024-08-01 NOTE — Progress Notes (Signed)
 Echos showsut for mild heart muscle stiffness and 2 leaky heart valvue for which typically they do not treat. There is also slightly high Pulm art pressure. Will discuss 08/30/24

## 2024-08-25 ENCOUNTER — Ambulatory Visit (HOSPITAL_COMMUNITY)
Admission: RE | Admit: 2024-08-25 | Discharge: 2024-08-25 | Disposition: A | Discharge status: Home / Self Care | Source: Ambulatory Visit | Attending: Internal Medicine | Admitting: Internal Medicine

## 2024-08-25 DIAGNOSIS — J84112 Idiopathic pulmonary fibrosis: Secondary | ICD-10-CM | POA: Diagnosis present

## 2024-08-25 DIAGNOSIS — J849 Interstitial pulmonary disease, unspecified: Secondary | ICD-10-CM | POA: Insufficient documentation

## 2024-08-30 ENCOUNTER — Ambulatory Visit: Admitting: Internal Medicine

## 2024-08-30 DIAGNOSIS — J849 Interstitial pulmonary disease, unspecified: Secondary | ICD-10-CM

## 2024-09-07 LAB — COMPREHENSIVE METABOLIC PANEL WITH GFR
ALT: 19 IU/L (ref 0–32)
AST: 17 IU/L (ref 0–40)
Albumin: 4.2 g/dL (ref 3.7–4.7)
Alkaline Phosphatase: 73 IU/L (ref 48–129)
BUN/Creatinine Ratio: 30 — ABNORMAL HIGH (ref 12–28)
BUN: 24 mg/dL (ref 8–27)
Bilirubin Total: 0.3 mg/dL (ref 0.0–1.2)
CO2: 23 mmol/L (ref 20–29)
Calcium: 10.5 mg/dL — ABNORMAL HIGH (ref 8.7–10.3)
Chloride: 101 mmol/L (ref 96–106)
Creatinine, Ser: 0.8 mg/dL (ref 0.57–1.00)
Globulin, Total: 3.3 g/dL (ref 1.5–4.5)
Glucose: 94 mg/dL (ref 70–99)
Potassium: 4 mmol/L (ref 3.5–5.2)
Sodium: 138 mmol/L (ref 134–144)
Total Protein: 7.5 g/dL (ref 6.0–8.5)
eGFR: 73 mL/min/1.73

## 2024-09-07 LAB — LIPID PANEL
Chol/HDL Ratio: 2.2 ratio (ref 0.0–4.4)
Cholesterol, Total: 187 mg/dL (ref 100–199)
HDL: 86 mg/dL
LDL Chol Calc (NIH): 91 mg/dL (ref 0–99)
Triglycerides: 54 mg/dL (ref 0–149)
VLDL Cholesterol Cal: 10 mg/dL (ref 5–40)

## 2024-09-07 LAB — VITAMIN D 25 HYDROXY (VIT D DEFICIENCY, FRACTURES): Vit D, 25-Hydroxy: 45 ng/mL (ref 30.0–100.0)

## 2024-09-07 LAB — TSH: TSH: 0.59 u[IU]/mL (ref 0.450–4.500)

## 2024-09-08 NOTE — Progress Notes (Signed)
 Ct with some scar and chronic changes. Will discuss 09/21/24 visit

## 2024-09-21 ENCOUNTER — Ambulatory Visit: Admitting: Internal Medicine

## 2024-09-21 ENCOUNTER — Encounter: Payer: Self-pay | Admitting: Internal Medicine

## 2024-09-21 VITALS — BP 132/60 | HR 93 | Ht 65.0 in | Wt 141.0 lb

## 2024-09-21 DIAGNOSIS — I5020 Unspecified systolic (congestive) heart failure: Secondary | ICD-10-CM

## 2024-09-21 DIAGNOSIS — R0609 Other forms of dyspnea: Secondary | ICD-10-CM | POA: Diagnosis not present

## 2024-09-21 DIAGNOSIS — R918 Other nonspecific abnormal finding of lung field: Secondary | ICD-10-CM | POA: Diagnosis not present

## 2024-09-21 DIAGNOSIS — J439 Emphysema, unspecified: Secondary | ICD-10-CM | POA: Diagnosis not present

## 2024-09-21 DIAGNOSIS — I5189 Other ill-defined heart diseases: Secondary | ICD-10-CM

## 2024-09-21 DIAGNOSIS — J849 Interstitial pulmonary disease, unspecified: Secondary | ICD-10-CM

## 2024-09-21 DIAGNOSIS — I340 Nonrheumatic mitral (valve) insufficiency: Secondary | ICD-10-CM | POA: Diagnosis not present

## 2024-09-21 DIAGNOSIS — Z87891 Personal history of nicotine dependence: Secondary | ICD-10-CM | POA: Diagnosis not present

## 2024-09-21 DIAGNOSIS — Z8679 Personal history of other diseases of the circulatory system: Secondary | ICD-10-CM

## 2024-09-21 NOTE — Patient Instructions (Addendum)
 In the lung abnormality Prior ANA and DS DNA positive 2017 at low level, later negative March 2025  - c clinically stable.  CT scan abnormalities now suggest interstitial lung abnormality  Plan - based on shared decision making - will hold off on anti-fibrotics - Spirometry and DLCO in 6 months   Pulmonary emphysema, unspecified emphysema type (HCC)  -Too bad Spiriva  and albuterol have not helped you  Plan -Expectant follow-up  Mitral valve regurgitation History of systolic heart failure Grade 1 diastolic dysfunction  Plan - Referral reestablish with cardiology - If they approve then you can get referred to pulmonary rehab   Solitary Pulmonary Nodule - stable 2018 -> 2021 august. Not reported Jan 2023  Plan  - no further followup   Elevated IgE level in 2017  Plan  -Per Dr. Altamease allergist  Followup  -6 months on a 15-minute slot - symptoms socre and simple walkng desaturation test at followup  - But after spirometry and DLCO

## 2024-09-21 NOTE — Progress Notes (Signed)
 "      @Patient  ID: Catherine Parks  Catherine Parks, female    DOB: Jul 15, 1941, 84 y.o.   MRN: [id number]  Chief Complaint  Patient presents with   Follow-up    pft    Referring provider: Tonita Fallow, MD  HPI  84 year old female former smoker followed for mild COPD with emphysema , lung nodule and ILD    TESTS: High resolution CT chest November 2017 showed ILD changes with  basilar predominant patchy subpleural reticulation, traction bronchiectasis and mild architectural distortion with mild honeycombing at the right base.  CT chest May 2018 showed no change in numerous tiny pulmonary nodules scattered throughout the lung 6 mm dating back to October 2017, stable ILD changes CT chest November 2018 showed stable pulmonary nodules felt to be benign with no specific follow-up noted.  Chronic changes of interstitial fibrosis without change.   PFT 06/13/17: FVC 2.89 L (121%) FEV1 1.91 L (103%) FEV1/FVC 0.66 FEF 25-75 1.00 L (63%)                                                                                                                        DLCO corrected 46% 12/27/16: FVC 2.81 L (117%) FEV1 1.85 L (99%) FEV1/FVC 0.66 FEF 25-75 0.93 L (58%)                                                                                                                         DLCO uncorrected 40% 09/27/16: FVC 2.92 L (121%) FEV1 2.01 L (180%) FEV1/FVC 0.69 FEF 25-75 1.20 L (74%) negative bronchodilator response TLC 5.05 L (94%) RV 85% ERV 501% DLCO corrected 50% (Hgb 12.7)      LABS 08/02/16 Alpha-1 antitrypsin: MM (153) IgG: 1276 IgA: 658 IgM: 76 IgE: 1652 RAST Panel:  D farinae 1.75 / Cockroach 1.66 & other weak positives CRP: 0.1 ESR: 59 ANA:  Positive DS DNA Ab:  181 Smith Ab:  <0.2 RNP Ab:  <0.2 SSA:  <0.2 SSB:  <0.2 Anti-CCP:  <16  OV 02/26/19 - follow up mild COPD, ILD, pulmonary nodules Patient presents for follow-up visit today.  She was last seen by Madelin Stank on 12/24/2017.  She  states that this is been a stable interval for her.  She has not seen any increase in her shortness of breath.  She states that she has been more inactive than usual due to current COVID pandemic.  She was going to the [facility] to walk 3 times  per week but the [employer] has been closed for the last few months.  Patient tries to remain active inside her house and does lift some light weight dumbbells.  She does continue to get winded with heavy activity.  She is not on any inhalers.  Patient has known ILD changes on CT scan dating back to October 2017.  Previous CT chest showed stable changes.  Patient did have a PFT completed in office today and it is stable from last PFT.  Patient has had previous autoimmune work-up that was suggestive of lupus and was referred to rheumatology.  She is not on no suppression.   Patient has a history of pulmonary nodules on CT scan.  These nodules had shown no change and were consistent with a benign etiology.  Patient's last CT chest was in 2018.  OV 01/19/2020  Subjective:  Patient ID: Catherine Parks  Catherine Parks, female , DOB: [birth year] , age 75 y.o. , MRN: [id number] , ADDRESS: [address] [place] [address]   01/19/2020 -   Chief Complaint  Patient presents with   Follow-up    no concerns   Follow-up combination emphysema and ILD with pulmonary nodule. ANA positive and double-stranded DNA positive in 2017  HPI Catherine  TESSLYN Parks 84 y.o. -returns for follow-up.  I personally not seen her in few to several years.  She is seen the nurse practitioner in between.  Nurse practitioner notes her above.  Overall she tells me she is doing stable.  She is very minimal symptoms.  Symptom score is documented below.  For positive ANA and double-stranded DNA last year she is did see a rheumatologist.  She does not know who she saw but said she was reassured that she did not have any systemic evidence of connective tissue disease.  She is not taking any inhalers.  She feels that her  shortness of breath on exertion is much improved.  She only notices it for stairs but even then it is minimal.  Her most recent test results were reviewed.  She is interested in serial monitoring and ensuring disease is stable.  She is concerned about interstitial lung disease.  She had a cardiac stress test in 2019 that was normal.  Her CT scan of the chest also shows enlarged pulmonary arteries.  She has grade 1 diastolic dysfunction on an echo.    OV 05/01/2020   Subjective:  Patient ID: [name]  Catherine Parks, female , DOB: July 24, 1941, age 42 y.o. years. , MRN: [id number],  ADDRESS: [address] [place] [address] PCP  Tonita Fallow, MD Providers : Treatment Team:  Attending Provider: Geronimo Amel, MD   Chief Complaint  Patient presents with   Follow-up    Pt states she has been doing okay since last visit. States that she still becomes SOB with exertion.    Follow-up combination emphysema and ILD with pulmonary nodule. ANA positive and double-stranded DNA positive 181 in 2017 Positive IGE - 1652 , and positive enviromental allergy  test RAST - Nov 2017 Piror > 50 pack smoker  HPI Catherine  AUBRIEE Parks 84 y.o. -presents for follow-up for the above medical issues.  This is to discuss the results of investigation.  At this point in time she tells me that her dyspnea is somewhat worse.  No chest pain or cough.  In the past inhalers have not worked.  Her pulmonary function test and CT scan of the chest shows stability in both ILD and emphysema and solitary pulmonary nodule.  She feels overall that her ILD has been there for a long time and it is been stable.  However she feels dyspnea is worse.  The interim finding is that she has new depressed ejection fraction of the left ventricle.  She has an appointment Dr. Vina Gull in June 16, 2020.  There is no associated chest pain.  There is no orthopnea there is no proximal nocturnal dyspnea.  She is reluctant about antifibrotic's  because she has GI issues although she has marked 0 for vomiting and diarrhea.  Review of the labs indicate that in 2017 she was also significantly positive IgE and other allergens.  She tells me there is no mold in the house.  She has no pet birds.  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        HRCT 04/24/20  CLINICAL DATA:  Interstitial lung disease. Increased shortness of breath with exertion the past few years.   EXAM: CT CHEST WITHOUT CONTRAST   TECHNIQUE: Multidetector CT imaging of the chest was performed following the standard protocol without intravenous contrast. High resolution imaging of the lungs, as well as inspiratory and expiratory imaging, was performed.   COMPARISON:  03/17/2019 07/17/2017.   FINDINGS: Cardiovascular: Atherosclerotic calcification of the aorta, aortic valve and coronary arteries. Pulmonic trunk is enlarged. Heart size normal. No pericardial effusion.   Mediastinum/Nodes: 1.2 cm low-attenuation left thyroid nodule. No follow-up recommended (ref: J Am Coll Radiol. 2015 Feb;12(2): 143-50).No pathologically enlarged mediastinal or axillary lymph nodes. Hilar regions are difficult to definitively evaluate without IV contrast but appear grossly unremarkable. Esophagus is slightly dilated.   Lungs/Pleura: Centrilobular and paraseptal emphysema. There is traction bronchiectasis, ground-glass and mild architectural distortion predominating in the lower lobes, as on prior exams. Minimal subpleural reticulation is seen in association. No honeycombing. 5 mm anterior left lower lobe nodule (3/120) is unchanged from 07/17/2017 and considered benign. No pleural fluid. Airway is unremarkable. Minimal air trapping.   Upper Abdomen: Visualized portions of the liver, adrenal glands, kidneys, spleen, pancreas, stomach and bowel are grossly unremarkable. No upper abdominal adenopathy.    Musculoskeletal: No worrisome lytic or sclerotic lesions. T12 and L1 compression fractures are new. No worrisome lytic or sclerotic lesions.   IMPRESSION: 1. Pulmonary parenchymal pattern of fibrosis may be due to fibrotic nonspecific interstitial pneumonitis. Chronic hypersensitivity pneumonitis is not excluded. Findings are indeterminate for UIP per consensus guidelines: Diagnosis of Idiopathic Pulmonary Fibrosis: An Official ATS/ERS/JRS/ALAT Clinical Practice Guideline. Am JINNY Honey Crit Care Med Vol 198, Iss 5, [phone], May 17 2017. 2. Aortic atherosclerosis (ICD10-I70.0). Coronary artery calcification. 3. Enlarged pulmonic trunk, indicative of pulmonary arterial hypertension. 4.  Emphysema (ICD10-J43.9).     Electronically Signed   By: Newell Eke M.D.   On: 04/24/2020 12:47    ECHO 04/21/20   Sonographer:    Elinor Fresh RDCS (AE)  Referring Phys: [id number] Tynisha Ogan   IMPRESSIONS     1. Left ventricular ejection fraction, by estimation, is 35 to 40%. The  left ventricle has moderately decreased function. The left ventricle  demonstrates global hypokinesis. Left ventricular diastolic parameters are  consistent with Grade I diastolic  dysfunction (impaired relaxation). The average left ventricular global  longitudinal strain is -15.8 %.   2. Right ventricular systolic function is mildly reduced. The right  ventricular size is normal.   3. The mitral valve is grossly normal. Trivial mitral valve  regurgitation.   4.  The aortic valve is grossly normal. Aortic valve regurgitation is not  visualized. No aortic stenosis is present.     ROS - per HPI   OV 10/31/2020  Subjective:  Patient ID: Annagrace  Catherine Parks, female , DOB: [birth year] , age 53 y.o. , MRN: [id number] , ADDRESS: [address] [place] [address] PCP Tonita Fallow, MD Patient Care Team: Tonita Fallow, MD as PCP - General (Internal Medicine) Octavia Bruckner, MD as Consulting  Physician (Optometry) Croitoru, Jerel, MD as Consulting Physician (Cardiology) Jakie Alm SAUNDERS, MD as Consulting Physician (Gastroenterology) Rodgers Drone, MD (Gynecology) Ivin Kocher, MD as Consulting Physician (Dermatology) Timmy Maude SAUNDERS, MD as Consulting Physician (Oncology) Ethyl Bruckner BRAVO, MD as Consulting Physician (Otolaryngology)  This Provider for this visit: Treatment Team:  Attending Provider: Geronimo Amel, MD    10/31/2020 -   Chief Complaint  Patient presents with   Follow-up    Doing well    Follow-up combination emphysema and ILD with pulmonary nodule.  - indeterminate UIP; Last CT Aug 2021  - HP panel neg aug 2021  -ANA positive and double-stranded DNA +181 in November 2017 ANA positive and double-stranded DNA positive 181 in 2017 Positive IGE   - 1652 , and positive enviromental allergy  test RAST - Nov 2017  - elevated 1411 - in aug 2021  - 2021 - sees Dr Altamease  Piror > 50 pack smoker   HPI Alaria  Catherine Parks 84 y.o. -presents for follow-up.  Last seen in summer 2021.  At the time her ILD was stable.  Etiology of the indeterminate pattern of ILD was not known.  I ordered a hypersensitive pneumonitis panel was negative.  Her IgE was elevated.  She has seen Dr. Altamease in allergy  according to history.  I do not know the details of this but apparently she is on as needed tablets.  At this point in time her dyspnea is improved.  Her walking desaturation test is stable.  Her pulmonary function test showed decline in FVC with stability/improvement in DLCO.  Overall she is stable.  She has not attended pulmonary rehabilitation a few years.  She found it beneficial in the past but she does not want to go again because she is feeling better and she plans to work out with her friends.  There was concern of low ejection fraction and I sent her to cardiology Dr. Okey.  Apparently repeat images were done and I confirmed this on the note.  The EF is  actually normal.  Overall patient is satisfied with the quality of life.  She is not interested in lung biopsy preemptive antifibrotic's.  She is interested in serial monitoring and supportive care at this point.  She will address antifibrotic's if she declines based on a risk-benefit analysis at that time.  For her associated emphysema last time I gave her Spiriva .  She tells me that it did not benefit her.  She is not interested in that or albuterol as needed.  No systemic manifestations of connective tissue disease so far    OV 09/20/2021  Subjective:  Patient ID: Annaston  Catherine Parks, female , DOB: 11-05-1940 , age 72 y.o. , MRN: [id number] , ADDRESS: [address] [place] [address] PCP Tonita Fallow, MD Patient Care Team: Tonita Fallow, MD as PCP - General (Internal Medicine) Octavia Bruckner, MD as Consulting Physician (Optometry) Croitoru, Jerel, MD as Consulting Physician (Cardiology) Jakie Alm SAUNDERS, MD as Consulting Physician (Gastroenterology) Rodgers Drone, MD (Gynecology) Ivin Kocher, MD as  Consulting Physician (Dermatology) Timmy Maude SAUNDERS, MD as Consulting Physician (Oncology) Ethyl Lonni BRAVO, MD (Inactive) as Consulting Physician (Otolaryngology)  This Provider for this visit: Treatment Team:  Attending Provider: Geronimo Amel, MD    09/20/2021 -   Chief Complaint  Patient presents with   Follow-up    PFT performed today.  Pt states she is about the same since last visit.     HPI Latara  LAQUESHIA CIHLAR 84 y.o. -returns for follow-up.  Not seen her in a year.  Most recent CT scan November 2022 shows probable UIP.  Symptoms and pulmonary function tests are stable.  There are no new issues other than the fact she updated me about her GI issues.  At this point in time we discussed about continued supportive care.  She is in favor of this.  We discussed antifibrotic's again.  I went over the antifibrotic's and the side effect profile and the  benefit profile.  She is again hesitant towards this.  Discussed the third option about clinical trials with an infusion study against connective tissue growth factor and the potential side effect profile of that.  She is interested in this.  We gave her the consent form.  But later the research coordinator indicated that because her FVC is over 95% she does not qualify for this trial.  Research coronary will inform the patient about this.  We will discuss in a case conference if this is IPF   FEB 2023 MDD  Interstitial Lung Disease Multidisciplinary Conference   Naryah  LUIS SAMI    MRN [id number]    DOB [birth year]  Primary Care Physician:McKeown, Elsie, MD  Referring Physician: CHRISTELLA Geronimo  Time of Conference: 7.30am- 8.30am Date of conference: 10/30/21 Location of Conference: -  Virtual  Participating Pulmonary: Dr. Amel Geronimo, MD - yes,  Dr Lonna Coder, MD - yes Pathology: Dr Katrine Muskrat, MD - no , ] Radiology: Dr Selinda Blue MD - no, Dr Toribio Aye MD - yes,  Others: Thom Chill and Spurgeon Craft  Brief History: Combination emphysema and ILD with pulmonary nodule. ANA positive and double-stranded DNA positive 181 in 2017. Positive IGE - 1652 , and positive enviromental allergy  test RAST - Nov 2017. Prior > 50 pack smoker. Asymptomatic mostly - very mild doe for stairs. Spirometry normal but DLCO reduced 43% - reflective of combined emphyhsema/fibrosis - PFT mild progression 2018-> 2023. Last HRCT - nov 2022 - described as prob UIP. -What is the MDD dx on radiology and clinical? - she does not want standard of care anti-fibrotic  Serology:   Latest Reference Range & Units 10/12/10 20:28 12/03/10 08:47 08/02/16 10:29  Anti Nuclear Antibody (ANA) Negative    Positive !  Angiotensin 1 CE 8 - 52 U/L  60 (H)   Cyclic Citrullin Peptide Ab Units   <16  dsDNA Ab 0 - 9 IU/mL   181 (H)  ENA RNP Ab 0.0 - 0.9 AI   <0.2  ENA SSA (RO) Ab 0.0 - 0.9 AI   <0.2  ENA SSB (LA) Ab 0.0 -  0.9 AI   <0.2  Tissue Transglutaminase Ab, IgA <20 units 4.2 U/ML    ENA SM Ab Ser-aCnc 0.0 - 0.9 AI   <0.2  !: Data is abnormal (H): Data is abnormally high  MDD discussion of CT scan    - Date or time period of scan:  HRCT: 07/24/2021 HRCT: 04/24/2020 HRCT: 08/07/2016   - Features mentioned:  - CT - has emphysema but  in terims of ILD has subpleural bronchiectactis and some HC in RLL Agree with prob UIP. Very slow progression on CT over long period time  - What is the final conclusion per 2018 ATS/Fleischner Criteria - PRobable UIP with very slow progression over time  Pathology discussion of biopsy no:   MDD Impression/Recs: IPF. She has positive DS-DNA but the radiologic pattern is not c/w IPAF. Next visit recheck ANA pattern and DS=-DNA   OV 11/26/2023  Subjective:  Patient ID: Catherine  Catherine Parks, female , DOB: 12/02/1940 , age 11 y.o. , MRN: [id number] , ADDRESS: [address] [place] [address] PCP Tonita Fallow, MD Patient Care Team: Tonita Fallow, MD as PCP - General (Internal Medicine) Octavia Bruckner, MD as Consulting Physician (Optometry) Croitoru, Jerel, MD as Consulting Physician (Cardiology) Rodgers Drone, MD (Gynecology) Timmy Maude SAUNDERS, MD as Consulting Physician (Oncology) Tonita Fallow, MD as Referring Physician (Internal Medicine) Geronimo Amel, MD as Consulting Physician (Pulmonary Disease)  This Provider for this visit: Treatment Team:  Attending Provider: Geronimo Amel, MD 11/26/2023 -   Chief Complaint  Patient presents with   Follow-up    Breathing is overall doing well. She has occ chest heaviness.      HPI Delorse  MAIRLYN TEGTMEYER 84 y.o. -presents for ILD.  Last seen just over 2 years ago.  She says that overall she is been doing well.  No respiratory issues no change in her respiratory status.  She does find that it is more difficult to do household work this because of a combination of fatigue which she believes is  because of shortness of breath and also aging.  Nevertheless there is no change in overall health status.  Denies any Raynaud's.  Denies any change in her baseline allergies.  No cough.  Exercise hypoxemia test here today is normal.  Her last PFT and CT scan was 2 years ago.  Her primary care physician suddenly passed away.  She is willing to get restage.  At the multidisciplinary case conference 2 years ago it was decided that we would recheck her ANA and double-stranded DNA.  She had this information with and she willing to do that.      OV 05/06/2024  Subjective:  Patient ID: Catherine  Catherine Parks, female , DOB: 06-17-1941 , age 68 y.o. , MRN: [id number] , ADDRESS: [address] [place] [address] PCP Jude Harden GAILS, MD Patient Care Team: Jude Harden GAILS, MD as PCP - General (Pulmonary Disease) Octavia Bruckner, MD as Consulting Physician (Optometry) Croitoru, Jerel, MD as Consulting Physician (Cardiology) Rodgers Drone, MD (Gynecology) Timmy Maude SAUNDERS, MD as Consulting Physician (Oncology) Tonita Fallow, MD as Referring Physician (Internal Medicine) Geronimo Amel, MD as Consulting Physician (Pulmonary Disease)  This Provider for this visit: Treatment Team:  Attending Provider: Geronimo Amel, MD  is    05/06/2024 -   Chief Complaint  Patient presents with   Medical Management of Chronic Issues   Interstitial Lung Disease    Breathing is overall doing well. She has occ dry cough.      HPI Perrie  ONI DIETZMAN 84 y.o. -returns for follow-up.  Last visit was in March 2025 as a routine follow-up.  Since her last visit she has been given a diagnosis of left shoulder arthritis not other specified by her primary care physician.  She has new primary care physician Dr. Chandra.  Otherwise Interim Health status: No new complaints No new medical problems. No new surgeries. No ER visits. No Urgent care visits.  No changes to medications  She did not have CT scan of the  chest because of scheduling issues.  Apparently someone in the CT scan scheduling suite dropped her call and then never called back.  She did have pulmonary function test FVC stable but her DLCO seems to be declining over time.  Nevertheless symptom scores are stable exercise desaturation test is also stable/slightly worse.    She is agreed to get a follow-up CT scan of the chest and echocardiogram.->  This is for pulmonary fibrosis worsening and pulmonary hypertension development monitoring.        OV 09/21/2024  Subjective:  Patient ID: Catherine  Catherine Parks, female , DOB: 03-08-1941 , age 34 y.o. , MRN: [id number] , ADDRESS: [address] [place] [address] PCP Chandra Toribio POUR, MD Patient Care Team: Chandra Toribio POUR, MD as PCP - General (Family Medicine) Octavia Bruckner, MD as Consulting Physician (Optometry) Croitoru, Jerel, MD as Consulting Physician (Cardiology) Rodgers Drone, MD (Gynecology) Timmy Maude SAUNDERS, MD as Consulting Physician (Oncology) Geronimo Amel, MD as Consulting Physician (Pulmonary Disease)  This Provider for this visit: Treatment Team:  Attending Provider: Geronimo Amel, MD    09/21/2024 -   Chief Complaint  Patient presents with   Medical Management of Chronic Issues   Interstitial Lung Disease    Breathing has been worse today since she woke up- relates to taking her sleep aid last night.      Follow-up combination emphysema and ILD with pulmonary nodule.  - indeterminate UIP; Last CT Nov 2022  - HP panel neg aug 2021  -ANA positive and double-stranded DNA +181 in November 2017  -<NEGATIVE in March 2024  = ACE positive 2012 at 4  - IPF DIAGNSOS FEB 2023 MDD  - Changed to ILA/postinflammatory scarring based on CT scan report in January 2026.   Positive IGE with psiti RAST Test 2017- sees DR Fleeta Smock sinc 2017   - [phone] , and positive enviromental allergy  test RAST - Nov 2017  - elevated 1411 - in aug 2021  - 2021 - sees Dr  Altamease  Piror > 50 pack smoker  Chronic systolic dysfunction sees Dr. Vina Ross-last visit October 2021  Hypercalcemia  Chronic abdominal pain sees Dr. Suzen brass  -Colonoscopy October 2022 with pancolonic diverticulosis and EGD is mild sized hiatal hernia, gastritis and duodenitis biopsies negative  Hypertension  Nonspecific arthrit  HPI Catherine  Catherine Parks 84 y.o. -Catherine  Catherine Parks is an 84 year old female with mild emphysema and ILD/ILD.  Who presents with increased shortness of breath.  She experiences increased shortness of breath today, which she attributes to a sleep medication taken last night. The sensation is described as similar to a 'hangover' effect previously experienced with the same medication. No grogginess is reported, but there is increased shortness of breath when climbing stairs.  She has a history of being advised against walking due to cardiac concerns, leading to decreased physical activity. Previously, she walked two to three times a week but has not resumed this level of activity. She experiences limitations in shopping, needing to stop after thirty to forty minutes due to shortness of breath.  She has a past medical history of consulting a cardiologist for stress-related issues and was informed of a possible enlarged heart. Various tests were conducted, but she has not seen a cardiologist since. No current chest pain is reported, but labored breathing occurs during walking.  She saw Dr. Lavon.  Late 2025 echo shows abnormal longitudinal  strain EF 50-55% [in 2019 was lower than this], grade 1 diastolic dysfunction.  Also concern for pulmonary hypertension.  She has a history of smoking and has been told she has mild emphysema. Previous trials of Spiriva  and albuterol did not alleviate her symptoms.  A stress test in 2019 indicated a weak heart pump. Currently, she is aware of having a stiff heart muscle and a leaky heart valve.       SYMPTOM SCALE -  ILD 01/19/2020  05/01/2020  10/31/2020  09/20/2021  05/06/2024  09/21/2024   O2 use ra ra ra ra ra ra  Shortness of Breath 0 -> 5 scale with 5 being worst (score 6 If unable to do)       At rest 0 0 1 0 0 0  Simple tasks - showers, clothes change, eating, shaving 0 1 1 0 1 1  Household (dishes, doing bed, laundry) 1 3 2 1 2 1   Shopping 1 3 1 1 2 2   Walking level at own pace 1 2 2 1 2 2   Walking up Stairs 2 3 2 2 2 2   Total (30-36) Dyspnea Score 5 12 9 5 9 8   How bad is your cough? 0 1 0 0 1 0  How bad is your fatigue 1 0 1 0 1 1  How bad is nausea 0 0 0 0 0 0  How bad is vomiting?  0 0 0 0 0 0  How bad is diarrhea? 0 0 0 0 0 0  How bad is anxiety? 0 2 0 0 0 1  How bad is depression 0 1 0 0 0 1       SIT STAND TEST - goal 15 times   09/21/2024    O2 used ra   PRobe - finter or forehead finger   Number sit and stand completed - goal 15 15   Time taken to complete 49   Resting Pulse Ox/HR/Dyspnea  100% and 82/min and dyspnea of 0/10    Peak measures 99 % and 108/min and dyspnea of 0/10   Final Pulse Ox/HR 99% and 103/min and dyspnea of 0/10   Desaturated </= 88% no   Desaturated <= 3% points no   Got Tachycardic >/= 90/min yes   Miscellaneous comments no      CT Chest data from date: dec 2025  - personally visualized and independently interpreted : Agree with post inflammatory scarring   IMPRESSION: 1. Favor bibasilar postinfectious scarring, unchanged in appearance from 07/24/2021. Difficult to definitively exclude fibrotic nonspecific interstitial pneumonitis. Findings are suggestive of an alternative diagnosis (not UIP) per consensus guidelines: Diagnosis of Idiopathic Pulmonary Fibrosis: An Official ATS/ERS/JRS/ALAT Clinical Practice Guideline. Am JINNY [employer] Crit Care Med Vol 198, Iss 5, [phone], May 17 2017. 2.  Aortic atherosclerosis (ICD10-I70.0). 3. Enlarged pulmonic trunk, indicative of pulmonary arterial hypertension. 4.  Emphysema (ICD10-J43.9).      Electronically Signed   By: Newell Eke M.D.   On: 08/27/2024 10:1    ECHO NOV 2025   IMPRESSIONS     1. Left ventricular ejection fraction, by estimation, is 50 to 55%. The  left ventricle has low normal function. The left ventricle has no regional  wall motion abnormalities. Left ventricular diastolic parameters are  consistent with Grade I diastolic  dysfunction (impaired relaxation). The average left ventricular global  longitudinal strain is -16.4 %. The global longitudinal strain is  abnormal.   2. Right ventricular systolic function is normal. The right  ventricular  size is normal. There is mildly elevated pulmonary artery systolic  pressure.   3. The mitral valve is normal in structure. Mild to moderate mitral valve  regurgitation. No evidence of mitral stenosis.   4. Tricuspid valve regurgitation is moderate.   5. The aortic valve is normal in structure. Aortic valve regurgitation is  mild. No aortic stenosis is present.   6. Abdominal aorta is normal sized. There is mild dilatation of the  ascending aorta, measuring 39 mm.   7. The inferior vena cava is normal in size with greater than 50%  respiratory variability, suggesting right atrial pressure of 3 mmHg.      Latest Ref Rng & Units 05/06/2024    2:29 PM 09/20/2021    9:52 AM 08/31/2020    8:53 AM 04/20/2020   10:47 AM 02/26/2019    8:48 AM 06/13/2017    1:24 PM 12/27/2016    8:50 AM  PFT Results  FVC-Pre L 2.55  2.65  2.64  2.78  2.75  2.89  2.81   FVC-Predicted Pre % 93  117  115  121  118  121  117   FVC-Post L     2.75     FVC-Predicted Post %     118     Pre FEV1/FVC % % 61  62  65  64  64  66  66   Post FEV1/FCV % %     65     FEV1-Pre L 1.57  1.65  1.72  1.78  1.77  1.91  1.85   FEV1-Predicted Pre % 77  94  97  100  98  103  99   FEV1-Post L     1.79     DLCO uncorrected ml/min/mmHg 9.86  10.04  13.53  10.81  11.55  12.85  10.95   DLCO UNC% % 49  49  67  53  57  47  40   DLCO corrected  ml/min/mmHg  10.01  13.53  10.92  11.66  12.56    DLCO COR %Predicted %  49  67  54  57  46    DLVA Predicted % 58  59  84  59  68  51  48   TLC L     5.10     TLC % Predicted %     95     RV % Predicted %     99          LAB RESULTS last 96 hours No results found.       has a past medical history of Allergic rhinitis, Arthritis, Bronchiectasis (HCC), Colon polyps, Diverticulosis, Emphysema of lung (HCC), GERD (gastroesophageal reflux disease), HSV-1 (herpes simplex virus 1) infection, Hyperparathyroidism, Hypertension, and Pulmonary nodule (07/10/2017).   reports that she quit smoking about 10 years ago. Her smoking use included cigarettes. She started smoking about 67 years ago. She has a 58 pack-year smoking history. She has never used smokeless tobacco.  Past Surgical History:  Procedure Laterality Date   ABDOMINAL HYSTERECTOMY     ovaries spared   CESAREAN SECTION  1969   COLONOSCOPY     DIRECT LARYNGOSCOPY N/A 10/21/2014   Procedure: DIRECT LARYNGOSCOPY;  Surgeon: Lonni FORBES Angle, MD;  Location: [facility];  Service: ENT;  Laterality: N/A;   EXCISION NASAL MASS Right 10/21/2014   Procedure: EXCISION NASOPHARYNGEAL MASS;  Surgeon: Lonni FORBES Angle, MD;  Location: [facility];  Service:  ENT;  Laterality: Right;   EYE SURGERY     cataracts   MASS EXCISION Right 10/21/2014   Procedure: RIGHT NECK NODE BIOPSY;  Surgeon: Lonni FORBES Angle, MD;  Location: [facility];  Service: ENT;  Laterality: Right;    Allergies[1]  Immunization History  Administered Date(s) Administered   DTaP 07/30/2011   Fluad Quad(high Dose 65+) 08/25/2020   INFLUENZA, HIGH DOSE SEASONAL PF 06/07/2016, 06/25/2017, 09/22/2018, 07/19/2019, 07/25/2021, 07/17/2023, 06/04/2024   Influenza-Unspecified 08/02/2022   Moderna Covid-19 Fall Seasonal Vaccine 15yrs & older 09/04/2022   PFIZER(Purple Top)SARS-COV-2 Vaccination 10/05/2019, 10/25/2019, 08/25/2020    Pfizer Covid-19 Vaccine Bivalent Booster 47yrs & up 07/21/2021   Pneumococcal Conjugate-13 11/22/2015   Pneumococcal Polysaccharide-23 05/11/2013   Pneumococcal-Unspecified 01/07/2002   Respiratory Syncytial Virus Vaccine,Recomb Aduvanted(Arexvy) 09/04/2022   Td 01/07/2002, 05/07/2012   Unspecified SARS-COV-2 Vaccination 07/29/2023    Family History  Problem Relation Age of Onset   Hypertension Mother    Stroke Mother    Heart attack Father    Breast cancer Sister        75s   Lung cancer Sister 61   Breast cancer Sister    Cancer Brother    Prostate cancer Brother    Other Brother        Heart Issues   Cancer Brother    Breast cancer Maternal Grandmother    Lung disease Neg Hx    Rheumatologic disease Neg Hx    Colon cancer Neg Hx    Esophageal cancer Neg Hx    Pancreatic cancer Neg Hx    Liver cancer Neg Hx     Current Medications[2]      Objective:   Vitals:   09/21/24 1323  BP: 132/60  Pulse: 93  SpO2: 100%  Weight: 141 lb (64 kg)  Height: 5' 5 (1.651 m)    Estimated body mass index is 23.46 kg/m as calculated from the following:   Height as of this encounter: 5' 5 (1.651 m).   Weight as of this encounter: 141 lb (64 kg).  @WEIGHTCHANGE @  [employer]   09/21/24 1323  Weight: 141 lb (64 kg)     Physical Exam   General: No distress. Looks well O2 at rest: no Cane present: no Sitting in wheel chair: no Frail: non Obese: on Neuro: Alert and Oriented x 3. GCS 15. Speech normal Psych: Pleasant Resp:  Barrel Chest - on.  Wheeze - on, Crackles - o, No overt respiratory distress CVS: Normal heart sounds. Murmurs - no Ext: Stigmata of Connective Tissue Disease - no HEENT: Normal upper airway. PEERL +. No post nasal drip        Assessment/     Assessment & Plan Interstitial lung abnormality (ILA)  Chronic obstructive pulmonary disease with emphysema, unspecified emphysema type (HCC)  Mitral valve insufficiency, unspecified  etiology  DOE (dyspnea on exertion)  Grade I diastolic dysfunction  History of chronic CHF    PLAN Patient Instructions  In the lung abnormality Prior ANA and DS DNA positive 2017 at low level, later negative March 2025  - c clinically stable.  CT scan abnormalities now suggest interstitial lung abnormality  Plan - based on shared decision making - will hold off on anti-fibrotics - Spirometry and DLCO in 6 months   Pulmonary emphysema, unspecified emphysema type (HCC)  -Too bad Spiriva  and albuterol have not helped you  Plan -Expectant follow-up  Mitral valve regurgitation History of systolic heart failure Grade 1 diastolic dysfunction  Plan - Referral reestablish with cardiology - If they approve then you can get referred to pulmonary rehab   Solitary Pulmonary Nodule - stable 2018 -> 2021 august. Not reported Jan 2023  Plan  - no further followup   Elevated IgE level in 2017  Plan  -Per Dr. Altamease allergist  Followup  -6 months on a 15-minute slot - symptoms socre and simple walkng desaturation test at followup  - But after spirometry and DLCO    FOLLOWUP    No follow-ups on file.    SIGNATURE    Dr. Dorethia Cave, M.D., F.C.C.P,  Pulmonary and Critical Care Medicine Staff Physician, [facility] Director - Interstitial Lung Disease  Program  Pulmonary Fibrosis [facility] [place], [place], [address]  Pager: [phone], If no answer or between  15:00h - 7:00h: call [phone] Telephone: [phone]  1:51 PM 09/21/2024   Moderate Complexity MDM OFFICE  2021 E/M guidelines, first released in 2021, with minor revisions added in 2023 and 2024 Must meet the requirements for 2 out of 3 dimensions to qualify.    Number and complexity of problems addressed Amount and/or complexity of data reviewed Risk of complications and/or morbidity  One or more chronic illness with mild  exacerbation, OR progression, OR  side effects of treatment  Two or more stable chronic illnesses  One undiagnosed new problem with uncertain prognosis  One acute illness with systemic symptoms   One Acute complicated injury Must meet the requirements for 1 of 3 of the categories)  Category 1: Tests and documents, historian  Any combination of 3 of the following:  Assessment requiring an independent historian  Review of prior external note(s) from each unique source  Review of results of each unique test  Ordering of each unique test    Category 2: Interpretation of tests   Independent interpretation of a test performed by another physician/other qualified health care professional (not separately reported)  Category 3: Discuss management/tests  Discussion of management or test interpretation with external physician/other qualified health care professional/appropriate source (not separately reported) Moderate risk of morbidity from additional diagnostic testing or treatment Examples only:  Prescription drug management  Decision regarding minor surgery with identfied patient or procedure risk factors  Decision regarding elective major surgery without identified patient or procedure risk factors  Diagnosis or treatment significantly limited by social determinants of health             HIGh Complexity  OFFICE   2021 E/M guidelines, first released in 2021, with minor revisions added in 2023. Must meet the requirements for 2 out of 3 dimensions to qualify.    Number and complexity of problems addressed Amount and/or complexity of data reviewed Risk of complications and/or morbidity  Severe exacerbation of chronic illness  Acute or chronic illnesses that may pose a threat to life or bodily function, e.g., multiple trauma, acute MI, pulmonary embolus, severe respiratory distress, progressive rheumatoid arthritis, psychiatric illness with potential threat to self or  others, peritonitis, acute renal failure, abrupt change in neurological status Must meet the requirements for 2 of 3 of the categories)  Category 1: Tests and documents, historian  Any combination of 3 of the following:  Assessment requiring an independent historian  Review of prior external note(s) from each unique source  Review of results of each unique test  Ordering of each unique test    Category 2: Interpretation of  tests    Independent interpretation of a test performed by another physician/other qualified health care professional (not separately reported)  Category 3: Discuss management/tests  Discussion of management or test interpretation with external physician/other qualified health care professional/appropriate source (not separately reported)  HIGH risk of morbidity from additional diagnostic testing or treatment Examples only:  Drug therapy requiring intensive monitoring for toxicity  Decision for elective major surgery with identified pateint or procedure risk factors  Decision regarding hospitalization or escalation of level of care  Decision for DNR or to de-escalate care   Parenteral controlled  substances            LEGEND - Independent interpretation involves the interpretation of a test for which there is a CPT code, and an interpretation or report is customary. When a review and interpretation of a test is performed and documented by the provider, but not separately reported (billed), then this would represent an independent interpretation. This report does not need to conform to the usual standards of a complete report of the test. This does not include interpretation of tests that do not have formal reports such as a complete blood count with differential and blood cultures. Examples would include reviewing a chest radiograph and documenting in the medical record an interpretation, but not separately reporting (billing) the interpretation of  the chest radiograph.   An appropriate source includes professionals who are not health care professionals but may be involved in the management of the patient, such as a clinical research associate, upper officer, case manager or teacher, and does not include discussion with family or informal caregivers.    - SDOH: SDOH are the conditions in the environments where people are born, live, learn, work, play, worship, and age that affect a wide range of health, functioning, and quality-of-life outcomes and risks. (e.g., housing, food insecurity, transportation, etc.). SDOH-related Z codes ranging from Z55-Z65 are the ICD-10-CM diagnosis codes used to document SDOH data Z55 - Problems related to education and literacy Z56 - Problems related to employment and unemployment Z57 - Occupational exposure to risk factors Z58 - Problems related to physical environment Z59 - Problems related to housing and economic circumstances [phone] - Problems related to social environment [phone] - Problems related to upbringing [phone] - Other problems related to primary support group, including family circumstances Z43 - Problems related to certain psychosocial circumstances Z65 - Problems related to other psychosocial circumstances     [1]  Allergies Allergen Reactions   Ace Inhibitors Cough   Lemon Oil Hives    Reaction to lemons   Natural Vegetable Orange [Psyllium]     oranges   Shellfish Allergy  Swelling and Hives  [2]  Current Outpatient Medications:    acetaminophen  (TYLENOL ) 500 MG tablet, Take 500 mg by mouth every 6 (six) hours as needed (pain)., Disp: , Rfl:    amLODipine  (NORVASC ) 10 MG tablet, TAKE 1 TABLET BY MOUTH EVERY DAY FOR BLOOD PRESSURE, Disp: 90 tablet, Rfl: 3   Ascorbic Acid (VITAMIN C) 100 MG tablet, Take 100 mg by mouth daily., Disp: , Rfl:    Blood Glucose Monitoring Suppl DEVI, 1 each by Does not apply route daily. May substitute to any manufacturer covered by patient's insurance., Disp: 1 each, Rfl:  0   doxylamine, Sleep, (SLEEP AID) 25 MG tablet, Take 25 mg by mouth at bedtime as needed., Disp: , Rfl:    famotidine  (PEPCID ) 40 MG tablet, Take 1 tablet (40 mg total) by mouth daily., Disp: 90 tablet, Rfl: 3  halobetasol  (ULTRAVATE ) 0.05 % cream, Apply topically 2 (two) times daily. Apply to Eczema Rash 2 x/day, Disp: 100 g, Rfl: 11   hydrocortisone 2.5 % cream, , Disp: , Rfl:    Magnesium  250 MG TABS, Take 1 tablet (250 mg total) by mouth daily., Disp: 30 tablet, Rfl: 0   Naphazoline HCl (CLEAR EYES OP), Place 1 drop into both eyes daily., Disp: , Rfl:    nitroGLYCERIN  (NITROSTAT ) 0.4 MG SL tablet, Place 0.4 mg under the tongue every 5 (five) minutes as needed for chest pain., Disp: , Rfl:    OVER THE COUNTER MEDICATION, Takes OTC eczema cream PRN., Disp: , Rfl:    pantoprazole  (PROTONIX ) 40 MG tablet, Take 1 tablet (40 mg total) by mouth daily., Disp: 90 tablet, Rfl: 3   rosuvastatin  (CRESTOR ) 5 MG tablet, TAKE 1 TABLET IN THE EVENING 5 DAYS A WEEK FOR CHOLESTEROL TO REDUCE HEART ATTACK/STROKE RISK, Disp: 90 tablet, Rfl: 1   triamterene -hydrochlorothiazide (MAXZIDE-25) 37.5-25 MG tablet, TAKE 1 TABLET DAILY FOR BP & FLUID, Disp: 90 tablet, Rfl: 3   Wheat Dextrin (BENEFIBER PO), Take 1 Dose by mouth daily., Disp: , Rfl:   "

## 2024-10-22 ENCOUNTER — Ambulatory Visit: Payer: Self-pay

## 2024-10-22 NOTE — Telephone Encounter (Signed)
"  ° °  FYI Only or Action Required?: FYI only for provider: appointment scheduled on 02.12.26.  Patient was last seen in primary care on 06/04/2024 by Chandra Toribio POUR, MD.  Called Nurse Triage reporting Dizziness.  Symptoms began several weeks ago.  Interventions attempted: Nothing.  Symptoms are: gradually worsening.  Triage Disposition: See Physician Within 24 Hours  Patient/caregiver understands and will follow disposition?: Yes   Message from [facility] sent at 10/22/2024  9:29 AM EST  Summary: BP and dizziness concern   Reason for Triage: The patient would like to be contacted by a member of staff to discuss ongoing dizziness and BP concerns that they are continuing to experience. The patient shares that they have been experiencing some slight dizziness at times and their BP was 165/65 when checked recently. The patient would like to discuss their concerns further when possible      Reason for Disposition  [1] MODERATE dizziness (e.g., interferes with normal activities) AND [2] has NOT been evaluated by doctor (or NP/PA) for this  (Exception: Dizziness caused by heat exposure, sudden standing, or poor fluid intake.)  Answer Assessment - Initial Assessment Questions 1. DESCRIPTION: Describe your dizziness.     Dizziness when standing  2. LIGHTHEADED: Do you feel lightheaded? (e.g., somewhat faint, woozy, weak upon standing)     Yes, lightheaded  3. VERTIGO: Do you feel like either you or the room is spinning or tilting? (i.e., vertigo)     Denies vertigo  4. SEVERITY: How bad is it?  Do you feel like you are going to faint? Can you stand and walk?     Trouble driving Can still walk 5. ONSET:  When did the dizziness begin?     X 2 weeks  6. AGGRAVATING FACTORS: Does anything make it worse? (e.g., standing, change in head position)     Standing from a sitting position   8. CAUSE: What do you think is causing the dizziness? (e.g., decreased fluids or  food, diarrhea, emotional distress, heat exposure, new medicine, sudden standing, vomiting; unknown)     Believes BP can be the cause  9. RECURRENT SYMPTOM: Have you had dizziness before? If Yes, ask: When was the last time? What happened that time?     Yes, recurrent  10. OTHER SYMPTOMS: Pt denies fever, chest pain, vomiting, diarrhea, bleeding        Pt reports dizziness Pt scheduled for a visit on 02.12.26  for further evaluation. Pt advised to change positions slowly and pay attention to her surroundings when changing positions instead of focusing with her head down looking at the ground when standing due to increased dizziness. Pt agrees with plan of care, will call back for any worsening symptoms  Protocols used: Dizziness - Lightheadedness-A-AH  "

## 2024-10-28 ENCOUNTER — Ambulatory Visit: Admitting: Family Medicine

## 2024-11-03 ENCOUNTER — Ambulatory Visit: Payer: Medicare PPO | Admitting: Internal Medicine

## 2024-11-23 ENCOUNTER — Ambulatory Visit: Admitting: Internal Medicine

## 2024-12-02 ENCOUNTER — Ambulatory Visit: Admitting: Family Medicine
